# Patient Record
Sex: Female | Born: 1965 | Race: White | Hispanic: No | Marital: Married | Smoking: Never smoker
Health system: Southern US, Community
[De-identification: ages and names within clinical notes are randomized; demographics above are authoritative.]

## PROBLEM LIST (undated history)

## (undated) DIAGNOSIS — R0600 Dyspnea, unspecified: Secondary | ICD-10-CM

## (undated) DIAGNOSIS — F419 Anxiety disorder, unspecified: Secondary | ICD-10-CM

## (undated) DIAGNOSIS — Z973 Presence of spectacles and contact lenses: Secondary | ICD-10-CM

## (undated) DIAGNOSIS — K5792 Diverticulitis of intestine, part unspecified, without perforation or abscess without bleeding: Secondary | ICD-10-CM

## (undated) DIAGNOSIS — K219 Gastro-esophageal reflux disease without esophagitis: Secondary | ICD-10-CM

## (undated) DIAGNOSIS — R110 Nausea: Secondary | ICD-10-CM

## (undated) DIAGNOSIS — M1990 Unspecified osteoarthritis, unspecified site: Secondary | ICD-10-CM

## (undated) DIAGNOSIS — E785 Hyperlipidemia, unspecified: Secondary | ICD-10-CM

## (undated) DIAGNOSIS — M549 Dorsalgia, unspecified: Secondary | ICD-10-CM

## (undated) DIAGNOSIS — R519 Headache, unspecified: Secondary | ICD-10-CM

## (undated) DIAGNOSIS — S92919A Unspecified fracture of unspecified toe(s), initial encounter for closed fracture: Secondary | ICD-10-CM

## (undated) DIAGNOSIS — R079 Chest pain, unspecified: Secondary | ICD-10-CM

## (undated) DIAGNOSIS — G510 Bell's palsy: Secondary | ICD-10-CM

## (undated) DIAGNOSIS — F32A Depression, unspecified: Secondary | ICD-10-CM

## (undated) DIAGNOSIS — I10 Essential (primary) hypertension: Secondary | ICD-10-CM

## (undated) DIAGNOSIS — Z9889 Other specified postprocedural states: Secondary | ICD-10-CM

## (undated) DIAGNOSIS — Z8719 Personal history of other diseases of the digestive system: Secondary | ICD-10-CM

## (undated) DIAGNOSIS — F329 Major depressive disorder, single episode, unspecified: Secondary | ICD-10-CM

## (undated) DIAGNOSIS — S82899A Other fracture of unspecified lower leg, initial encounter for closed fracture: Secondary | ICD-10-CM

## (undated) HISTORY — DX: Essential (primary) hypertension: I10

## (undated) HISTORY — PX: TONSILLECTOMY: SUR1361

## (undated) HISTORY — PX: CHOLECYSTECTOMY: SHX55

## (undated) HISTORY — PX: CATARACT EXTRACTION: SUR2

## (undated) HISTORY — DX: Anxiety disorder, unspecified: F41.9

## (undated) HISTORY — PX: REDUCTION MAMMAPLASTY: SUR839

## (undated) HISTORY — DX: Hyperlipidemia, unspecified: E78.5

## (undated) HISTORY — PX: INJECTION KNEE: SHX2446

## (undated) HISTORY — DX: Other fracture of unspecified lower leg, initial encounter for closed fracture: S82.899A

## (undated) HISTORY — DX: Gastro-esophageal reflux disease without esophagitis: K21.9

## (undated) HISTORY — DX: Unspecified fracture of unspecified toe(s), initial encounter for closed fracture: S92.919A

## (undated) HISTORY — DX: Diverticulitis of intestine, part unspecified, without perforation or abscess without bleeding: K57.92

## (undated) HISTORY — DX: Chest pain, unspecified: R07.9

## (undated) HISTORY — PX: KNEE ARTHROSCOPY: SUR90

---

## 1986-04-25 HISTORY — PX: SEPTOPLASTY: SUR1290

## 1989-04-25 HISTORY — PX: OTHER SURGICAL HISTORY: SHX169

## 1997-11-07 ENCOUNTER — Other Ambulatory Visit: Admission: RE | Admit: 1997-11-07 | Discharge: 1997-11-07 | Payer: Self-pay | Admitting: *Deleted

## 1997-11-12 ENCOUNTER — Other Ambulatory Visit: Admission: RE | Admit: 1997-11-12 | Discharge: 1997-11-12 | Payer: Self-pay | Admitting: *Deleted

## 1998-03-30 ENCOUNTER — Other Ambulatory Visit: Admission: RE | Admit: 1998-03-30 | Discharge: 1998-03-30 | Payer: Self-pay | Admitting: *Deleted

## 1998-10-06 ENCOUNTER — Emergency Department (HOSPITAL_COMMUNITY): Admission: EM | Admit: 1998-10-06 | Discharge: 1998-10-06 | Payer: Self-pay | Admitting: Emergency Medicine

## 1999-06-29 ENCOUNTER — Encounter: Payer: Self-pay | Admitting: Family Medicine

## 1999-06-29 ENCOUNTER — Encounter: Admission: RE | Admit: 1999-06-29 | Discharge: 1999-06-29 | Payer: Self-pay | Admitting: Family Medicine

## 1999-12-25 ENCOUNTER — Encounter: Payer: Self-pay | Admitting: Family Medicine

## 1999-12-25 LAB — CONVERTED CEMR LAB

## 2000-11-10 ENCOUNTER — Encounter: Payer: Self-pay | Admitting: Emergency Medicine

## 2000-11-10 ENCOUNTER — Emergency Department (HOSPITAL_COMMUNITY): Admission: EM | Admit: 2000-11-10 | Discharge: 2000-11-10 | Payer: Self-pay | Admitting: Emergency Medicine

## 2000-11-22 ENCOUNTER — Encounter: Payer: Self-pay | Admitting: Neurosurgery

## 2000-11-22 ENCOUNTER — Encounter: Admission: RE | Admit: 2000-11-22 | Discharge: 2000-11-22 | Payer: Self-pay | Admitting: Neurosurgery

## 2001-12-27 ENCOUNTER — Emergency Department (HOSPITAL_COMMUNITY): Admission: EM | Admit: 2001-12-27 | Discharge: 2001-12-27 | Payer: Self-pay | Admitting: Emergency Medicine

## 2002-04-10 ENCOUNTER — Encounter (HOSPITAL_COMMUNITY): Admission: RE | Admit: 2002-04-10 | Discharge: 2002-05-10 | Payer: Self-pay | Admitting: Orthopedic Surgery

## 2002-04-25 HISTORY — PX: BACK SURGERY: SHX140

## 2002-05-03 ENCOUNTER — Encounter: Payer: Self-pay | Admitting: Orthopedic Surgery

## 2002-05-03 ENCOUNTER — Encounter: Admission: RE | Admit: 2002-05-03 | Discharge: 2002-05-03 | Payer: Self-pay | Admitting: *Deleted

## 2002-05-27 ENCOUNTER — Encounter: Payer: Self-pay | Admitting: Orthopedic Surgery

## 2002-05-27 ENCOUNTER — Encounter: Admission: RE | Admit: 2002-05-27 | Discharge: 2002-05-27 | Payer: Self-pay | Admitting: Orthopedic Surgery

## 2002-06-10 ENCOUNTER — Encounter: Admission: RE | Admit: 2002-06-10 | Discharge: 2002-06-10 | Payer: Self-pay | Admitting: *Deleted

## 2002-06-10 ENCOUNTER — Encounter: Payer: Self-pay | Admitting: Orthopedic Surgery

## 2002-07-05 ENCOUNTER — Encounter: Payer: Self-pay | Admitting: Neurosurgery

## 2002-07-09 ENCOUNTER — Encounter: Payer: Self-pay | Admitting: Neurosurgery

## 2002-07-09 ENCOUNTER — Inpatient Hospital Stay (HOSPITAL_COMMUNITY): Admission: RE | Admit: 2002-07-09 | Discharge: 2002-07-12 | Payer: Self-pay | Admitting: Neurosurgery

## 2002-07-17 ENCOUNTER — Encounter: Payer: Self-pay | Admitting: Neurosurgery

## 2002-07-17 ENCOUNTER — Encounter: Admission: RE | Admit: 2002-07-17 | Discharge: 2002-07-17 | Payer: Self-pay | Admitting: Neurosurgery

## 2002-08-27 ENCOUNTER — Encounter: Payer: Self-pay | Admitting: Neurosurgery

## 2002-08-27 ENCOUNTER — Encounter: Admission: RE | Admit: 2002-08-27 | Discharge: 2002-08-27 | Payer: Self-pay | Admitting: Neurosurgery

## 2002-10-08 ENCOUNTER — Encounter: Payer: Self-pay | Admitting: Neurosurgery

## 2002-10-08 ENCOUNTER — Encounter: Admission: RE | Admit: 2002-10-08 | Discharge: 2002-10-08 | Payer: Self-pay | Admitting: Neurosurgery

## 2002-11-14 ENCOUNTER — Encounter: Payer: Self-pay | Admitting: Neurosurgery

## 2002-11-14 ENCOUNTER — Ambulatory Visit (HOSPITAL_COMMUNITY): Admission: RE | Admit: 2002-11-14 | Discharge: 2002-11-14 | Payer: Self-pay | Admitting: Neurosurgery

## 2003-05-01 ENCOUNTER — Encounter: Admission: RE | Admit: 2003-05-01 | Discharge: 2003-05-01 | Payer: Self-pay | Admitting: Obstetrics and Gynecology

## 2003-09-25 ENCOUNTER — Ambulatory Visit (HOSPITAL_COMMUNITY): Admission: RE | Admit: 2003-09-25 | Discharge: 2003-09-25 | Payer: Self-pay | Admitting: Family Medicine

## 2003-10-15 ENCOUNTER — Emergency Department (HOSPITAL_COMMUNITY): Admission: EM | Admit: 2003-10-15 | Discharge: 2003-10-15 | Payer: Self-pay | Admitting: Emergency Medicine

## 2003-10-28 ENCOUNTER — Ambulatory Visit (HOSPITAL_COMMUNITY): Admission: RE | Admit: 2003-10-28 | Discharge: 2003-10-28 | Payer: Self-pay | Admitting: Vascular Surgery

## 2003-11-27 ENCOUNTER — Encounter: Admission: RE | Admit: 2003-11-27 | Discharge: 2003-11-27 | Payer: Self-pay | Admitting: Neurosurgery

## 2003-12-02 ENCOUNTER — Ambulatory Visit (HOSPITAL_COMMUNITY): Admission: RE | Admit: 2003-12-02 | Discharge: 2003-12-02 | Payer: Self-pay | Admitting: Neurosurgery

## 2004-01-22 ENCOUNTER — Ambulatory Visit: Payer: Self-pay | Admitting: Psychiatry

## 2004-01-28 ENCOUNTER — Ambulatory Visit: Payer: Self-pay | Admitting: Psychology

## 2004-02-27 ENCOUNTER — Ambulatory Visit: Payer: Self-pay | Admitting: Psychology

## 2004-05-04 ENCOUNTER — Ambulatory Visit: Payer: Self-pay | Admitting: Psychology

## 2004-06-28 ENCOUNTER — Ambulatory Visit: Payer: Self-pay | Admitting: Family Medicine

## 2004-07-01 ENCOUNTER — Ambulatory Visit: Payer: Self-pay | Admitting: Family Medicine

## 2004-08-31 ENCOUNTER — Ambulatory Visit: Payer: Self-pay | Admitting: Psychiatry

## 2004-11-25 ENCOUNTER — Ambulatory Visit: Payer: Self-pay | Admitting: Psychiatry

## 2005-01-14 ENCOUNTER — Other Ambulatory Visit: Admission: RE | Admit: 2005-01-14 | Discharge: 2005-01-14 | Payer: Self-pay | Admitting: Obstetrics and Gynecology

## 2005-02-21 ENCOUNTER — Ambulatory Visit: Payer: Self-pay | Admitting: Psychology

## 2005-04-27 ENCOUNTER — Ambulatory Visit: Payer: Self-pay | Admitting: Internal Medicine

## 2005-05-03 ENCOUNTER — Ambulatory Visit: Payer: Self-pay | Admitting: Psychiatry

## 2005-07-18 ENCOUNTER — Ambulatory Visit: Payer: Self-pay | Admitting: Family Medicine

## 2005-10-10 ENCOUNTER — Ambulatory Visit: Payer: Self-pay | Admitting: Family Medicine

## 2005-10-27 ENCOUNTER — Ambulatory Visit: Payer: Self-pay | Admitting: Family Medicine

## 2005-10-31 ENCOUNTER — Ambulatory Visit: Payer: Self-pay | Admitting: Family Medicine

## 2005-12-28 ENCOUNTER — Ambulatory Visit: Payer: Self-pay | Admitting: Family Medicine

## 2006-01-10 ENCOUNTER — Ambulatory Visit (HOSPITAL_COMMUNITY): Payer: Self-pay | Admitting: Psychiatry

## 2006-02-07 ENCOUNTER — Ambulatory Visit (HOSPITAL_COMMUNITY): Payer: Self-pay | Admitting: Psychiatry

## 2006-04-06 ENCOUNTER — Ambulatory Visit (HOSPITAL_COMMUNITY): Payer: Self-pay | Admitting: Psychiatry

## 2006-06-08 ENCOUNTER — Ambulatory Visit (HOSPITAL_COMMUNITY): Payer: Self-pay | Admitting: Psychiatry

## 2006-07-12 ENCOUNTER — Ambulatory Visit: Payer: Self-pay | Admitting: Family Medicine

## 2006-08-02 ENCOUNTER — Ambulatory Visit: Payer: Self-pay | Admitting: Family Medicine

## 2006-08-03 ENCOUNTER — Emergency Department (HOSPITAL_COMMUNITY): Admission: EM | Admit: 2006-08-03 | Discharge: 2006-08-03 | Payer: Self-pay | Admitting: Emergency Medicine

## 2006-08-03 ENCOUNTER — Ambulatory Visit (HOSPITAL_COMMUNITY): Payer: Self-pay | Admitting: Psychiatry

## 2006-08-04 ENCOUNTER — Ambulatory Visit: Payer: Self-pay | Admitting: Family Medicine

## 2006-10-03 ENCOUNTER — Telehealth (INDEPENDENT_AMBULATORY_CARE_PROVIDER_SITE_OTHER): Payer: Self-pay | Admitting: *Deleted

## 2006-10-10 ENCOUNTER — Ambulatory Visit (HOSPITAL_COMMUNITY): Payer: Self-pay | Admitting: Psychiatry

## 2006-11-07 ENCOUNTER — Ambulatory Visit: Payer: Self-pay | Admitting: Family Medicine

## 2006-11-07 LAB — CONVERTED CEMR LAB
BUN: 7 mg/dL (ref 6–23)
CO2: 28 meq/L (ref 19–32)
Calcium: 9 mg/dL (ref 8.4–10.5)
Chloride: 107 meq/L (ref 96–112)
Direct LDL: 94.8 mg/dL
Glucose, Bld: 89 mg/dL (ref 70–99)
TSH: 1.54 microintl units/mL (ref 0.35–5.50)
VLDL: 43 mg/dL — ABNORMAL HIGH (ref 0–40)

## 2006-11-09 ENCOUNTER — Encounter: Payer: Self-pay | Admitting: Family Medicine

## 2006-11-09 DIAGNOSIS — I10 Essential (primary) hypertension: Secondary | ICD-10-CM | POA: Insufficient documentation

## 2006-11-09 DIAGNOSIS — Z9089 Acquired absence of other organs: Secondary | ICD-10-CM

## 2006-11-09 DIAGNOSIS — E7800 Pure hypercholesterolemia, unspecified: Secondary | ICD-10-CM | POA: Insufficient documentation

## 2006-11-10 DIAGNOSIS — F411 Generalized anxiety disorder: Secondary | ICD-10-CM | POA: Insufficient documentation

## 2006-11-10 DIAGNOSIS — K219 Gastro-esophageal reflux disease without esophagitis: Secondary | ICD-10-CM

## 2006-11-20 ENCOUNTER — Ambulatory Visit: Payer: Self-pay | Admitting: Family Medicine

## 2006-11-20 DIAGNOSIS — N62 Hypertrophy of breast: Secondary | ICD-10-CM | POA: Insufficient documentation

## 2006-12-01 ENCOUNTER — Telehealth: Payer: Self-pay | Admitting: Family Medicine

## 2006-12-12 ENCOUNTER — Telehealth: Payer: Self-pay | Admitting: Family Medicine

## 2006-12-13 ENCOUNTER — Ambulatory Visit (HOSPITAL_COMMUNITY): Admission: RE | Admit: 2006-12-13 | Discharge: 2006-12-13 | Payer: Self-pay | Admitting: Plastic Surgery

## 2006-12-18 ENCOUNTER — Telehealth: Payer: Self-pay | Admitting: Family Medicine

## 2006-12-19 ENCOUNTER — Ambulatory Visit: Payer: Self-pay | Admitting: Family Medicine

## 2006-12-19 DIAGNOSIS — M545 Low back pain, unspecified: Secondary | ICD-10-CM | POA: Insufficient documentation

## 2006-12-19 DIAGNOSIS — M549 Dorsalgia, unspecified: Secondary | ICD-10-CM

## 2007-01-08 ENCOUNTER — Telehealth (INDEPENDENT_AMBULATORY_CARE_PROVIDER_SITE_OTHER): Payer: Self-pay | Admitting: *Deleted

## 2007-01-31 ENCOUNTER — Ambulatory Visit (HOSPITAL_COMMUNITY): Payer: Self-pay | Admitting: Psychology

## 2007-02-01 ENCOUNTER — Encounter: Payer: Self-pay | Admitting: Family Medicine

## 2007-02-02 ENCOUNTER — Ambulatory Visit: Payer: Self-pay | Admitting: Family Medicine

## 2007-02-04 LAB — CONVERTED CEMR LAB
AST: 21 units/L (ref 0–37)
BUN: 8 mg/dL (ref 6–23)
Cholesterol: 197 mg/dL (ref 0–200)
GFR calc Af Amer: 101 mL/min
GFR calc non Af Amer: 84 mL/min
Glucose, Bld: 90 mg/dL (ref 70–99)
Potassium: 4.3 meq/L (ref 3.5–5.1)
Total CHOL/HDL Ratio: 8.1
Triglycerides: 196 mg/dL — ABNORMAL HIGH (ref 0–149)

## 2007-02-05 ENCOUNTER — Ambulatory Visit: Payer: Self-pay | Admitting: Family Medicine

## 2007-03-12 ENCOUNTER — Telehealth: Payer: Self-pay | Admitting: Family Medicine

## 2007-04-16 ENCOUNTER — Telehealth: Payer: Self-pay | Admitting: Family Medicine

## 2007-04-17 ENCOUNTER — Ambulatory Visit: Payer: Self-pay | Admitting: Family Medicine

## 2007-05-28 ENCOUNTER — Ambulatory Visit: Payer: Self-pay | Admitting: Family Medicine

## 2007-05-28 DIAGNOSIS — R609 Edema, unspecified: Secondary | ICD-10-CM | POA: Insufficient documentation

## 2007-05-28 DIAGNOSIS — R51 Headache: Secondary | ICD-10-CM

## 2007-05-28 DIAGNOSIS — R519 Headache, unspecified: Secondary | ICD-10-CM | POA: Insufficient documentation

## 2007-06-04 ENCOUNTER — Telehealth (INDEPENDENT_AMBULATORY_CARE_PROVIDER_SITE_OTHER): Payer: Self-pay | Admitting: *Deleted

## 2007-06-07 ENCOUNTER — Ambulatory Visit: Payer: Self-pay | Admitting: Family Medicine

## 2007-06-07 ENCOUNTER — Telehealth: Payer: Self-pay | Admitting: Family Medicine

## 2007-06-08 ENCOUNTER — Telehealth: Payer: Self-pay | Admitting: Family Medicine

## 2007-06-08 ENCOUNTER — Emergency Department (HOSPITAL_COMMUNITY): Admission: EM | Admit: 2007-06-08 | Discharge: 2007-06-08 | Payer: Self-pay | Admitting: Emergency Medicine

## 2007-06-11 ENCOUNTER — Ambulatory Visit: Payer: Self-pay | Admitting: Family Medicine

## 2007-06-11 DIAGNOSIS — G527 Disorders of multiple cranial nerves: Secondary | ICD-10-CM | POA: Insufficient documentation

## 2007-07-30 ENCOUNTER — Telehealth: Payer: Self-pay | Admitting: Family Medicine

## 2007-07-31 ENCOUNTER — Ambulatory Visit: Payer: Self-pay | Admitting: Family Medicine

## 2007-07-31 LAB — CONVERTED CEMR LAB
CO2: 26 meq/L (ref 19–32)
Calcium: 9 mg/dL (ref 8.4–10.5)
Chloride: 109 meq/L (ref 96–112)
Glucose, Bld: 107 mg/dL — ABNORMAL HIGH (ref 70–99)
Potassium: 3.6 meq/L (ref 3.5–5.1)

## 2007-08-04 ENCOUNTER — Encounter: Admission: RE | Admit: 2007-08-04 | Discharge: 2007-08-04 | Payer: Self-pay | Admitting: Family Medicine

## 2007-08-09 ENCOUNTER — Telehealth: Payer: Self-pay | Admitting: Family Medicine

## 2007-08-27 ENCOUNTER — Telehealth: Payer: Self-pay | Admitting: Family Medicine

## 2007-08-29 ENCOUNTER — Ambulatory Visit (HOSPITAL_COMMUNITY): Admission: RE | Admit: 2007-08-29 | Discharge: 2007-08-29 | Payer: Self-pay | Admitting: Neurosurgery

## 2007-09-20 ENCOUNTER — Encounter (HOSPITAL_COMMUNITY): Admission: RE | Admit: 2007-09-20 | Discharge: 2007-10-20 | Payer: Self-pay | Admitting: Neurosurgery

## 2007-09-21 ENCOUNTER — Ambulatory Visit: Payer: Self-pay | Admitting: Family Medicine

## 2007-09-21 ENCOUNTER — Telehealth: Payer: Self-pay | Admitting: Family Medicine

## 2007-09-21 LAB — CONVERTED CEMR LAB
Chloride: 110 meq/L (ref 96–112)
GFR calc Af Amer: 101 mL/min
GFR calc non Af Amer: 83 mL/min
Glucose, Bld: 90 mg/dL (ref 70–99)
Potassium: 4.1 meq/L (ref 3.5–5.1)
Sodium: 142 meq/L (ref 135–145)

## 2007-09-24 ENCOUNTER — Ambulatory Visit: Payer: Self-pay | Admitting: Family Medicine

## 2007-10-16 ENCOUNTER — Ambulatory Visit: Payer: Self-pay | Admitting: Family Medicine

## 2007-10-16 LAB — CONVERTED CEMR LAB
ALT: 21 units/L (ref 0–35)
Albumin: 3.6 g/dL (ref 3.5–5.2)
Alkaline Phosphatase: 53 units/L (ref 39–117)
BUN: 10 mg/dL (ref 6–23)
Basophils Relative: 0.2 % (ref 0.0–1.0)
CO2: 25 meq/L (ref 19–32)
Calcium: 9.1 mg/dL (ref 8.4–10.5)
Chloride: 112 meq/L (ref 96–112)
Creatinine, Ser: 0.9 mg/dL (ref 0.4–1.2)
Eosinophils Relative: 0.9 % (ref 0.0–5.0)
HDL: 22.9 mg/dL — ABNORMAL LOW (ref >39.0)
Platelets: 288 10*3/uL (ref 150–400)
RBC: 4.6 M/uL (ref 3.87–5.11)
RDW: 13.5 % (ref 11.5–14.6)
TSH: 1.07 microintl units/mL (ref 0.35–5.50)
Triglycerides: 146 mg/dL (ref 0–149)
VLDL: 29 mg/dL (ref 0–40)

## 2007-10-19 ENCOUNTER — Ambulatory Visit: Payer: Self-pay | Admitting: Family Medicine

## 2007-11-09 ENCOUNTER — Encounter: Admission: RE | Admit: 2007-11-09 | Discharge: 2007-11-09 | Payer: Self-pay | Admitting: Neurosurgery

## 2007-11-20 ENCOUNTER — Ambulatory Visit (HOSPITAL_COMMUNITY): Payer: Self-pay | Admitting: Psychology

## 2007-11-28 ENCOUNTER — Encounter (HOSPITAL_COMMUNITY): Admission: RE | Admit: 2007-11-28 | Discharge: 2007-12-28 | Payer: Self-pay | Admitting: Neurosurgery

## 2007-12-13 ENCOUNTER — Telehealth: Payer: Self-pay | Admitting: Family Medicine

## 2007-12-24 ENCOUNTER — Ambulatory Visit (HOSPITAL_COMMUNITY): Payer: Self-pay | Admitting: Psychology

## 2008-01-30 ENCOUNTER — Telehealth: Payer: Self-pay | Admitting: Family Medicine

## 2008-02-07 ENCOUNTER — Telehealth: Payer: Self-pay | Admitting: Family Medicine

## 2008-02-28 ENCOUNTER — Ambulatory Visit: Payer: Self-pay | Admitting: Family Medicine

## 2008-02-29 ENCOUNTER — Encounter: Payer: Self-pay | Admitting: Family Medicine

## 2008-03-03 ENCOUNTER — Ambulatory Visit: Payer: Self-pay | Admitting: Family Medicine

## 2008-04-07 ENCOUNTER — Ambulatory Visit (HOSPITAL_COMMUNITY): Payer: Self-pay | Admitting: Psychology

## 2008-04-14 ENCOUNTER — Telehealth: Payer: Self-pay | Admitting: Family Medicine

## 2008-04-15 ENCOUNTER — Encounter: Payer: Self-pay | Admitting: Family Medicine

## 2008-06-10 ENCOUNTER — Encounter: Payer: Self-pay | Admitting: Family Medicine

## 2008-06-10 ENCOUNTER — Telehealth: Payer: Self-pay | Admitting: Family Medicine

## 2008-08-02 ENCOUNTER — Emergency Department (HOSPITAL_COMMUNITY): Admission: EM | Admit: 2008-08-02 | Discharge: 2008-08-02 | Payer: Self-pay | Admitting: Emergency Medicine

## 2008-09-26 ENCOUNTER — Telehealth: Payer: Self-pay | Admitting: Family Medicine

## 2008-11-11 ENCOUNTER — Ambulatory Visit: Payer: Self-pay | Admitting: Family Medicine

## 2008-11-11 DIAGNOSIS — M546 Pain in thoracic spine: Secondary | ICD-10-CM | POA: Insufficient documentation

## 2008-11-11 DIAGNOSIS — R21 Rash and other nonspecific skin eruption: Secondary | ICD-10-CM | POA: Insufficient documentation

## 2008-11-12 ENCOUNTER — Telehealth (INDEPENDENT_AMBULATORY_CARE_PROVIDER_SITE_OTHER): Payer: Self-pay | Admitting: Internal Medicine

## 2008-11-18 ENCOUNTER — Telehealth (INDEPENDENT_AMBULATORY_CARE_PROVIDER_SITE_OTHER): Payer: Self-pay | Admitting: Internal Medicine

## 2008-12-11 ENCOUNTER — Telehealth: Payer: Self-pay | Admitting: Family Medicine

## 2009-04-13 ENCOUNTER — Telehealth: Payer: Self-pay | Admitting: Family Medicine

## 2009-04-21 ENCOUNTER — Ambulatory Visit: Payer: Self-pay | Admitting: Family Medicine

## 2009-05-04 ENCOUNTER — Telehealth: Payer: Self-pay | Admitting: Family Medicine

## 2009-06-16 ENCOUNTER — Ambulatory Visit: Payer: Self-pay | Admitting: Family Medicine

## 2009-06-16 DIAGNOSIS — J209 Acute bronchitis, unspecified: Secondary | ICD-10-CM

## 2009-06-17 ENCOUNTER — Telehealth: Payer: Self-pay | Admitting: Family Medicine

## 2009-06-25 ENCOUNTER — Telehealth: Payer: Self-pay | Admitting: Family Medicine

## 2009-07-03 ENCOUNTER — Ambulatory Visit: Payer: Self-pay | Admitting: Family Medicine

## 2009-09-07 ENCOUNTER — Telehealth: Payer: Self-pay | Admitting: Family Medicine

## 2009-09-17 ENCOUNTER — Ambulatory Visit: Payer: Self-pay | Admitting: Family Medicine

## 2009-10-08 ENCOUNTER — Telehealth: Payer: Self-pay | Admitting: Family Medicine

## 2009-11-05 ENCOUNTER — Ambulatory Visit: Payer: Self-pay | Admitting: Family Medicine

## 2009-12-04 ENCOUNTER — Ambulatory Visit: Payer: Self-pay | Admitting: Family Medicine

## 2009-12-25 ENCOUNTER — Telehealth: Payer: Self-pay | Admitting: Family Medicine

## 2010-01-01 ENCOUNTER — Ambulatory Visit: Payer: Self-pay | Admitting: Family Medicine

## 2010-01-13 ENCOUNTER — Telehealth: Payer: Self-pay | Admitting: Family Medicine

## 2010-01-14 ENCOUNTER — Ambulatory Visit: Payer: Self-pay | Admitting: Family Medicine

## 2010-01-18 ENCOUNTER — Telehealth: Payer: Self-pay | Admitting: Family Medicine

## 2010-02-24 ENCOUNTER — Telehealth: Payer: Self-pay | Admitting: Family Medicine

## 2010-03-08 ENCOUNTER — Emergency Department (HOSPITAL_COMMUNITY): Admission: EM | Admit: 2010-03-08 | Discharge: 2010-03-08 | Payer: Self-pay | Admitting: Emergency Medicine

## 2010-03-12 ENCOUNTER — Ambulatory Visit: Payer: Self-pay | Admitting: Family Medicine

## 2010-03-12 DIAGNOSIS — S9030XA Contusion of unspecified foot, initial encounter: Secondary | ICD-10-CM

## 2010-05-15 ENCOUNTER — Encounter: Payer: Self-pay | Admitting: *Deleted

## 2010-05-17 ENCOUNTER — Encounter: Payer: Self-pay | Admitting: Plastic Surgery

## 2010-05-27 NOTE — Progress Notes (Signed)
Summary: not  much better  Phone Note Call from Patient Call back at Home Phone [phone]   Caller: Patient Call For: Dr. Hetty Ely Summary of Call: Pt was seen on 2/22 for bronchitis and given a z-pack.  She says she is still not much better.  Still congested, still coughing.  She says she cant  take guaifenesin , this bothers her stomach.  She is asking for a refill on z-pack and also would like tesselon.  Uses [facility]. Initial call taken by: Lowella Petties CMA,  June 25, 2009 9:37 AM  Follow-up for Phone Call        Reasonable. Sent in both repeat Zpak and Tessalon. Follow-up by: Shaune Leeks MD,  June 25, 2009 10:20 AM  Additional Follow-up for Phone Call Additional follow up Details #1::        Pt also wants  prednisone for her cough, she says she has had these sxs off and on for 5 months.  Finished a course of prednisone last week. Additional Follow-up by: Lowella Petties CMA,  June 25, 2009 10:55 AM    Additional Follow-up for Phone Call Additional follow up Details #2::    Won't do the prednisone w/o seeing her. Try what we have done. Follow-up by: Shaune Leeks MD,  June 25, 2009 11:45 AM  Additional Follow-up for Phone Call Additional follow up Details #3:: Details for Additional Follow-up Action Taken: Advised pt. Additional Follow-up by: Lowella Petties CMA,  June 25, 2009 12:00 PM  New/Updated Medications: ZITHROMAX Z-PAK 250 MG TABS (AZITHROMYCIN) as dir TESSALON 200 MG CAPS (BENZONATATE) one tab by mouth three times a day as needed fot cough. Prescriptions: TESSALON 200 MG CAPS (BENZONATATE) one tab by mouth three times a day as needed fot cough.  #30 x 0   Entered and Authorized by:   Shaune Leeks MD   Signed by:   Shaune Leeks MD on 06/25/2009   Method used:   Electronically to        [facility] (retail)       [address] [place], [place]  [address]       Ph:  [phone]       Fax: [phone]   RxID:   [id number] Christena Deem Z-PAK 250 MG TABS (AZITHROMYCIN) as dir  #1 pak x 0   Entered and Authorized by:   Shaune Leeks MD   Signed by:   Shaune Leeks MD on 06/25/2009   Method used:   Electronically to        [facility] (retail)       [address]       [place], [place]  [address]       Ph: [phone]       Fax: [phone]   RxID:   [id number]

## 2010-05-27 NOTE — Progress Notes (Signed)
Summary: refill request for xanax  Phone Note Refill Request Message from:  Fax from Pharmacy  Refills Requested: Medication #1:  ALPRAZOLAM 0.25 MG TABS 1/2-1 Tab by mouth two times a day as needed for anxiousness   Last Refilled: 01/13/2010 Faxed request form [facility], phone [phone].  Initial call taken by: Lowella Petties CMA, AAMA,  February 24, 2010 10:46 AM  Follow-up for Phone Call        Rx called to pharmacy Follow-up by: Linde Gillis CMA Duncan Dull),  February 24, 2010 11:13 AM    Prescriptions: ALPRAZOLAM 0.25 MG TABS (ALPRAZOLAM) 1/2-1 Tab by mouth two times a day as needed for anxiousness  #30 x 0   Entered and Authorized by:   Ruthe Mannan MD   Signed by:   Ruthe Mannan MD on 02/24/2010   Method used:   Telephoned to ...       [facility] (retail)       [address]       [place], [place]  [address]       Ph: [phone]       Fax: [phone]   RxID:   [id number]

## 2010-05-27 NOTE — Assessment & Plan Note (Signed)
Summary: 1 M F/U WEIGHT MGMT/DLO   Vital Signs:  Patient profile:   45 year old female Height:      64.50 inches Weight:      226.38 pounds BMI:     38.40 Temp:     98.4 degrees F oral Pulse rate:   76 / minute Pulse rhythm:   regular BP sitting:   130 / 80  (left arm) Cuff size:   large  Vitals Entered By: Linde Gillis CMA Duncan Dull) (December 04, 2009 3:33 PM) CC: 1 month follow up weight management, anxiety   History of Present Illness: 45 yo here to follow up anxiety and weight management.  Anxiety- Has had issues with anxiety since she was a teenager. When her mom died in 09/21/2001, was on multiple medications, including Paxil, Wellbutrin, Prozac.  All made her feel worse.  Gained 30 pounds on Prozac, Paxil made her feel more anxious.  Added Busprione 15 mg which she feels has helped immensely.  Has only used three Alprazolam tablets since we started the Buspirone. No SI or HI.   Denies any other symptoms of depression.  Obesity- has tried to cut back on her portions.  Knows that her downfall is regular sodas, she is cutting back.  24 hour food recall: breakfast:  instant grits with water, fruit, low fat yogurt lunch- chicken alredo (lean cuisine) dinner- salad, soda, ranch dressing  Has actually gained weight since last office visit.  Current Medications (verified): 1)  Tylenol 325 Mg  Tabs (Acetaminophen) .... As Needed 2)  Alprazolam 0.25 Mg Tabs (Alprazolam) .... 1/2-1 Tab By Mouth Two Times A Day As Needed For Anxiousness 3)  Buspirone Hcl 15 Mg Tabs (Buspirone Hcl) .Marland Kitchen.. 1 Tab By Mouth Daily. 4)  Phentermine Hcl 15 Mg Caps (Phentermine Hcl) .Marland Kitchen.. 1 Tab By Mouth Every Morning- One To Two Hours Before Breakfast.  Allergies: 1)  ! Penicillin V Potassium (Penicillin V Potassium) 2)  ! Erythromycin Base (Erythromycin Base) 3)  ! Bentyl (Dicyclomine Hcl) 4)  ! Cipro (Ciprofloxacin Hcl) 5)  ! Cleocin (Clindamycin Hcl) 6)  ! Paxil (Paroxetine Hcl) 7)  ! Premarin (Estrogens  Conjugated) 8)  ! Provera (Medroxyprogesterone Acetate) 9)  ! Tagamet Hb (Cimetidine) 10)  ! Crestor (Rosuvastatin Calcium) 11)  ! Doxycycline Hyclate (Doxycycline Hyclate) 12)  ! * Lisinopril  Past History:  Past Medical History: Last updated: 11/27/06 Anxiety GERD Hyperlipidemia Hypertension  Past Surgical History: Last updated: Nov 27, 2006 1988        Septoplasty  Nasal Fracture 1991        Tonsillectomy 1998        Choleycystectomy 06/29/99      Flexion L/S Series - mild degenerative changes 7/02         WNL 10/15/02    MRI L/S - Fusion L5-S1 07/09/02    Back surgery - Dr. Wynetta Emery   L4/5 Fusion 09/25/03      MRA Neck, ?stenosis  LICA   ? left vert. artery stenosis 09/25/03      MRI Brain with and without  WNL except mild sinusitis    MRA nml. 10/28/03      Angiogram Head/Neck nml.  Family History: Last updated: 11/27/2006 Father: Died 27, MI Mother: Alive 40 COPD, HTN, GERD Siblings: 2 brothers HTN, high chol. GP:  High chol. CV:  (+) Father MI, PGF MI, PGM BP:  (+) Mother, brother DM:  (+) G. 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  Risk Factors: Smoking Status: never (11/09/2006)  Review of Systems      See HPI Psych:  Denies mental problems, panic attacks, sense of great danger, suicidal thoughts/plans, thoughts of violence, unusual visions or sounds, and thoughts /plans of harming others.  Physical Exam  General:  alert, well-developed, well-nourished, well-hydrated, and overweight-appearing.   Psych:  talkative, pleasant, normal affect, moderately anxious   Impression & Recommendations:  Problem # 1:  ANXIETY /PANIC ATTACKS (ICD-300.00) Assessment Improved Time spent with patient 25 minutes, more  than 50% of this time was spent counseling patient on anxiety and weight management. Stable on Busprinone, Xanax as needed.  Her updated medication list for this problem includes:    Alprazolam 0.25 Mg Tabs (Alprazolam) .Marland Kitchen... 1/2-1 tab by mouth two times a day as needed for anxiousness    Buspirone Hcl 15 Mg Tabs (Buspirone hcl) .Marland Kitchen... 1 tab by mouth daily.  Problem # 2:  MORBID OBESITY (ICD-278.01) Assessment: Deteriorated Will try phenteramine.  Counselled on appropriate use.  Pt to follow up in 1 month.  Complete Medication List: 1)  Tylenol 325 Mg Tabs (Acetaminophen) .... As needed 2)  Alprazolam 0.25 Mg Tabs (Alprazolam) .... 1/2-1 tab by mouth two times a day as needed for anxiousness 3)  Buspirone Hcl 15 Mg Tabs (Buspirone hcl) .Marland Kitchen.. 1 tab by mouth daily. 4)  Phentermine Hcl 15 Mg Caps (Phentermine hcl) .Marland Kitchen.. 1 tab by mouth every morning- one to two hours before breakfast.  Patient Instructions: 1)  Please make a follow up appointment in 1 month. 2)  The medication list was reviewed and reconciled.  All changed / newly prescribed medications were explained.  A complete medication list was provided to the patient / caregiver.  Prescriptions: PHENTERMINE HCL 15 MG CAPS (PHENTERMINE HCL) 1 tab by mouth every morning- one to two hours before breakfast.  #30 x 0   Entered and Authorized by:   Ruthe Mannan MD   Signed by:   Ruthe Mannan MD on 12/04/2009   Method used:   Print then Give to Patient   RxID:   [id number]   Current Allergies (reviewed today): ! PENICILLIN V POTASSIUM (PENICILLIN V POTASSIUM) ! ERYTHROMYCIN BASE (ERYTHROMYCIN BASE) ! BENTYL (DICYCLOMINE HCL) ! CIPRO (CIPROFLOXACIN HCL) ! CLEOCIN (CLINDAMYCIN HCL) ! PAXIL (PAROXETINE HCL) ! PREMARIN (ESTROGENS CONJUGATED) ! PROVERA (MEDROXYPROGESTERONE ACETATE) ! TAGAMET HB (CIMETIDINE) ! CRESTOR (ROSUVASTATIN CALCIUM) ! DOXYCYCLINE HYCLATE (DOXYCYCLINE HYCLATE) ! * LISINOPRIL

## 2010-05-27 NOTE — Progress Notes (Signed)
Summary: alprazolam  Phone Note Refill Request Message from:  Fax from Pharmacy on December 25, 2009 11:38 AM  Refills Requested: Medication #1:  ALPRAZOLAM 0.25 MG TABS 1/2-1 Tab by mouth two times a day as needed for anxiousness   Last Refilled: 11/09/2009 Refill request from [facility]. [phone]  Initial call taken by: Melody Comas,  December 25, 2009 11:39 AM  Follow-up for Phone Call        please call in.  Follow-up by: Crawford Givens MD,  December 25, 2009 1:34 PM  Additional Follow-up for Phone Call Additional follow up Details #1::        Called to pharmacy. Additional Follow-up by: Lowella Petties CMA,  December 25, 2009 2:48 PM    Prescriptions: ALPRAZOLAM 0.25 MG TABS (ALPRAZOLAM) 1/2-1 Tab by mouth two times a day as needed for anxiousness  #30 x 0   Entered and Authorized by:   Crawford Givens MD   Signed by:   Crawford Givens MD on 12/25/2009   Method used:   Telephoned to ...       [facility] (retail)       [address]       [place], [place]  [address]       Ph: [phone]       Fax: [phone]   RxID:   [id number]

## 2010-05-27 NOTE — Assessment & Plan Note (Signed)
Summary: FOLLOW UP WITH MEDICATION/ 30 MINS   Vital Signs:  Patient profile:   45 year old female Height:      64.50 inches Weight:      223.25 pounds BMI:     37.87 Temp:     98.3 degrees F oral Pulse rate:   88 / minute Pulse rhythm:   regular BP sitting:   126 / 84  (right arm) Cuff size:   regular  Vitals Entered By: Linde Gillis CMA Duncan Dull) (November 05, 2009 3:26 PM) CC: follow-up visit medications   History of Present Illness: 45 yo here to discuss anxiety.  Has had issues with anxiety since she was a teenager. When her mom died in 09/21/01, was on multiple medications, including Paxil, Wellbutrin, Prozac.  All made her feel worse.  Gained 30 pounds on Prozac, Paxil made her feel more anxious.  Adding Buspirone 15 mg daily to her Alprazalam as needed.  She feels much better!! Mind is no longer racing, not anxious anymore.  Everyone in her life says she seems much happier. Adoption of her two kids went through last month.  No SI or HI.   Denies any other symptoms of depression.  Current Medications (verified): 1)  Tylenol 325 Mg  Tabs (Acetaminophen) .... As Needed 2)  Alprazolam 0.25 Mg Tabs (Alprazolam) .... 1/2-1 Tab By Mouth Two Times A Day As Needed For Anxiousness 3)  Tessalon 200 Mg Caps (Benzonatate) .... One Tab By Mouth Three Times A Day As Needed Fot Cough. 4)  Buspirone Hcl 15 Mg Tabs (Buspirone Hcl) .Marland Kitchen.. 1 Tab By Mouth Daily.  Allergies: 1)  ! Penicillin V Potassium (Penicillin V Potassium) 2)  ! Erythromycin Base (Erythromycin Base) 3)  ! Bentyl (Dicyclomine Hcl) 4)  ! Cipro (Ciprofloxacin Hcl) 5)  ! Cleocin (Clindamycin Hcl) 6)  ! Paxil (Paroxetine Hcl) 7)  ! Premarin (Estrogens Conjugated) 8)  ! Provera (Medroxyprogesterone Acetate) 9)  ! Tagamet Hb (Cimetidine) 10)  ! Crestor (Rosuvastatin Calcium) 11)  ! Doxycycline Hyclate (Doxycycline Hyclate) 12)  ! * Lisinopril  Past History:  Past Medical History: Last updated:  11-27-06 Anxiety GERD Hyperlipidemia Hypertension  Past Surgical History: Last updated: 11/27/06 1988        Septoplasty  Nasal Fracture 1991        Tonsillectomy 1998        Choleycystectomy 06/29/99      Flexion L/S Series - mild degenerative changes 7/02         WNL 10/15/02    MRI L/S - Fusion L5-S1 07/09/02    Back surgery - Dr. Wynetta Emery   L4/5 Fusion 09/25/03      MRA Neck, ?stenosis  LICA   ? left vert. artery stenosis 09/25/03      MRI Brain with and without  WNL except mild sinusitis    MRA nml. 10/28/03      Angiogram Head/Neck nml.  Family History: Last updated: 11-27-06 Father: Died 58, MI Mother: Alive 38 COPD, HTN, GERD Siblings: 2 brothers HTN, high chol. GP:  High chol. CV:  (+) Father MI, PGF MI, PGM BP:  (+) Mother, brother DM:  (+) G. 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  Risk Factors: Smoking Status: never (11/27/2006)  Review of  Systems      See HPI Psych:  Denies mental problems, panic attacks, sense of great danger, suicidal thoughts/plans, thoughts of violence, unusual visions or sounds, and thoughts /plans of harming others.  Physical Exam  General:  alert, well-developed, well-nourished, well-hydrated, and overweight-appearing.   Psych:  talkative, pleasant, normal affect, moderately anxious   Impression & Recommendations:  Problem # 1:  ANXIETY /PANIC ATTACKS (ICD-300.00) Assessment Improved Time spent with patient 25 minutes, more than 50% of this time was spent counseling patient on anxiety.  Doing much better.  Continue current dose of Buspar and Alprazolam.  Follow up as needed.  Her updated medication list for this problem includes:    Alprazolam 0.25 Mg Tabs (Alprazolam)  .Marland Kitchen... 1/2-1 tab by mouth two times a day as needed for anxiousness    Buspirone Hcl 15 Mg Tabs (Buspirone hcl) .Marland Kitchen... 1 tab by mouth daily.  Complete Medication List: 1)  Tylenol 325 Mg Tabs (Acetaminophen) .... As needed 2)  Alprazolam 0.25 Mg Tabs (Alprazolam) .... 1/2-1 tab by mouth two times a day as needed for anxiousness 3)  Tessalon 200 Mg Caps (Benzonatate) .... One tab by mouth three times a day as needed fot cough. 4)  Buspirone Hcl 15 Mg Tabs (Buspirone hcl) .Marland Kitchen.. 1 tab by mouth daily. Prescriptions: ALPRAZOLAM 0.25 MG TABS (ALPRAZOLAM) 1/2-1 Tab by mouth two times a day as needed for anxiousness  #30 x 0   Entered and Authorized by:   Ruthe Mannan MD   Signed by:   Ruthe Mannan MD on 11/05/2009   Method used:   Print then Give to Patient   RxID:   [id number]   Current Allergies (reviewed today): ! PENICILLIN V POTASSIUM (PENICILLIN V POTASSIUM) ! ERYTHROMYCIN BASE (ERYTHROMYCIN BASE) ! BENTYL (DICYCLOMINE HCL) ! CIPRO (CIPROFLOXACIN HCL) ! CLEOCIN (CLINDAMYCIN HCL) ! PAXIL (PAROXETINE HCL) ! PREMARIN (ESTROGENS CONJUGATED) ! PROVERA (MEDROXYPROGESTERONE ACETATE) ! TAGAMET HB (CIMETIDINE) ! CRESTOR (ROSUVASTATIN CALCIUM) ! DOXYCYCLINE HYCLATE (DOXYCYCLINE HYCLATE) ! * LISINOPRIL

## 2010-05-27 NOTE — Assessment & Plan Note (Signed)
Summary: ROA FOR 1 MONTH FOLLOW-UP/JRR   Vital Signs:  Patient profile:   45 year old female Height:      64.50 inches Weight:      214 pounds BMI:     36.30 Temp:     98.6 degrees F oral Pulse rate:   80 / minute Pulse rhythm:   regular BP sitting:   120 / 88  (left arm) Cuff size:   large  Vitals Entered By: Linde Gillis CMA Duncan Dull) (January 01, 2010 3:53 PM) CC: 1 month follow up   History of Present Illness: 45 yo here to follow up anxiety and weight management.  Anxiety- Has had issues with anxiety since she was a teenager. When her mom died in 15-Sep-2001, was on multiple medications, including Paxil, Wellbutrin, Prozac.  All made her feel worse.  Gained 30 pounds on Prozac, Paxil made her feel more anxious.  Added Busprione 15 mg which she feels has helped immensely.   Obesity- has tried to cut back on her portions.  Knows that her downfall is regular sodas, she is cutting back. Eating 1200-1800 calories per day. Has lost 22 pounds!  Walking more, more energy.      Current Medications (verified): 1)  Tylenol 325 Mg  Tabs (Acetaminophen) .... As Needed 2)  Alprazolam 0.25 Mg Tabs (Alprazolam) .... 1/2-1 Tab By Mouth Two Times A Day As Needed For Anxiousness 3)  Buspirone Hcl 15 Mg Tabs (Buspirone Hcl) .Marland Kitchen.. 1 Tab By Mouth Daily. 4)  Phentermine Hcl 15 Mg Caps (Phentermine Hcl) .Marland Kitchen.. 1 Tab By Mouth Every Morning- One To Two Hours Before Breakfast, 1 Tab By Mouth 1-2 Hours Before Lunch.  Allergies: 1)  ! Penicillin V Potassium (Penicillin V Potassium) 2)  ! Erythromycin Base (Erythromycin Base) 3)  ! Bentyl (Dicyclomine Hcl) 4)  ! Cipro (Ciprofloxacin Hcl) 5)  ! Cleocin (Clindamycin Hcl) 6)  ! Paxil (Paroxetine Hcl) 7)  ! Premarin (Estrogens Conjugated) 8)  ! Provera (Medroxyprogesterone Acetate) 9)  ! Tagamet Hb (Cimetidine) 10)  ! Crestor (Rosuvastatin Calcium) 11)  ! Doxycycline Hyclate (Doxycycline Hyclate) 12)  ! * Lisinopril  Past History:  Past Medical  History: Last updated: November 21, 2006 Anxiety GERD Hyperlipidemia Hypertension  Past Surgical History: Last updated: 21-Nov-2006 1988        Septoplasty  Nasal Fracture 1991        Tonsillectomy 1998        Choleycystectomy 06/29/99      Flexion L/S Series - mild degenerative changes 7/02         WNL 10/15/02    MRI L/S - Fusion L5-S1 07/09/02    Back surgery - Dr. Wynetta Emery   L4/5 Fusion 09/25/03      MRA Neck, ?stenosis  LICA   ? left vert. artery stenosis 09/25/03      MRI Brain with and without  WNL except mild sinusitis    MRA nml. 10/28/03      Angiogram Head/Neck nml.  Family History: Last updated: 11-21-2006 Father: Died 38, MI Mother: Alive 25 COPD, HTN, GERD Siblings: 2 brothers HTN, high chol. GP:  High chol. CV:  (+) Father MI, PGF MI, PGM BP:  (+) Mother, brother DM:  (+) G. 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  Risk Factors: Smoking Status: never (11/09/2006)  Review of Systems      See HPI Psych:  Denies mental problems, panic attacks, sense of great danger, suicidal thoughts/plans, thoughts of violence, unusual visions or sounds, and thoughts /plans of harming others.  Physical Exam  General:  alert, well-developed, well-nourished, well-hydrated, and overweight-appearing.  Lost 12 pounds! Psych:  talkative, pleasant, normal affect, moderately anxious   Impression & Recommendations:  Problem # 1:  MORBID OBESITY (ICD-278.01) Assessment Improved Doing well with Phentermine, will add 15 mg dose at lunch. BP looks great today, follow up in one month.  Problem # 2:  ANXIETY /PANIC ATTACKS (ICD-300.00) Assessment: Improved Continue current dose of Buspirone and as needed alprazolam. Her updated medication  list for this problem includes:    Alprazolam 0.25 Mg Tabs (Alprazolam) .Marland Kitchen... 1/2-1 tab by mouth two times a day as needed for anxiousness    Buspirone Hcl 15 Mg Tabs (Buspirone hcl) .Marland Kitchen... 1 tab by mouth daily.  Complete Medication List: 1)  Tylenol 325 Mg Tabs (Acetaminophen) .... As needed 2)  Alprazolam 0.25 Mg Tabs (Alprazolam) .... 1/2-1 tab by mouth two times a day as needed for anxiousness 3)  Buspirone Hcl 15 Mg Tabs (Buspirone hcl) .Marland Kitchen.. 1 tab by mouth daily. 4)  Phentermine Hcl 15 Mg Caps (Phentermine hcl) .Marland Kitchen.. 1 tab by mouth every morning- one to two hours before breakfast, 1 tab by mouth 1-2 hours before lunch. Prescriptions: PHENTERMINE HCL 15 MG CAPS (PHENTERMINE HCL) 1 tab by mouth every morning- one to two hours before breakfast, 1 tab by mouth 1-2 hours before lunch.  #60 x 0   Entered and Authorized by:   Ruthe Mannan MD   Signed by:   Ruthe Mannan MD on 01/01/2010   Method used:   Print then Give to Patient   RxID:   [id number]   Current Allergies (reviewed today): ! PENICILLIN V POTASSIUM (PENICILLIN V POTASSIUM) ! ERYTHROMYCIN BASE (ERYTHROMYCIN BASE) ! BENTYL (DICYCLOMINE HCL) ! CIPRO (CIPROFLOXACIN HCL) ! CLEOCIN (CLINDAMYCIN HCL) ! PAXIL (PAROXETINE HCL) ! PREMARIN (ESTROGENS CONJUGATED) ! PROVERA (MEDROXYPROGESTERONE ACETATE) ! TAGAMET HB (CIMETIDINE) ! CRESTOR (ROSUVASTATIN CALCIUM) ! DOXYCYCLINE HYCLATE (DOXYCYCLINE HYCLATE) ! * LISINOPRIL

## 2010-05-27 NOTE — Assessment & Plan Note (Signed)
Summary: F/U  ER ON 03/08/10/CLE   Vital Signs:  Patient profile:   45 year old female Height:      64.50 inches Weight:      210 pounds BMI:     35.62 Temp:     98.1 degrees F oral Pulse rate:   72 / minute Pulse rhythm:   regular BP sitting:   132 / 84  (right arm) Cuff size:   large  Vitals Entered By: Linde Gillis CMA Duncan Dull) (March 12, 2010 2:50 PM) CC: ER follow up Cassandra Mcconnell   History of Present Illness: 45 yo here for follow up [facility] ER.  ER records reviewed. Tripped at home and caught her left foot on her bed at home. Top of foot was very tender to touch, no swelling. had a previous 5th metacarpal fracture but toes feel fine.  xray neg for acute findings, felt to be a contusion.  She's doing fine, no pain with walking, no swelling.  Also needs to me to fill out forms for her adoption agency, brings them in today.  Allergies: 1)  ! Penicillin V Potassium (Penicillin V Potassium) 2)  ! Erythromycin Base (Erythromycin Base) 3)  ! Bentyl (Dicyclomine Hcl) 4)  ! Cipro (Ciprofloxacin Hcl) 5)  ! Cleocin (Clindamycin Hcl) 6)  ! Paxil (Paroxetine Hcl) 7)  ! Premarin (Estrogens Conjugated) 8)  ! Provera (Medroxyprogesterone Acetate) 9)  ! Tagamet Hb (Cimetidine) 10)  ! Crestor (Rosuvastatin Calcium) 11)  ! Doxycycline Hyclate (Doxycycline Hyclate) 12)  ! * Lisinopril 13)  ! Flexeril  Review of Systems      See HPI General:  Denies malaise. Eyes:  Denies blurring. CV:  Denies chest pain or discomfort. Resp:  Denies shortness of breath. GI:  Denies abdominal pain. MS:  Denies joint pain, joint redness, joint swelling, loss of strength, muscle weakness, and thoracic pain. Derm:  Denies rash. Neuro:  Denies headaches and numbness. Psych:  Denies anxiety and depression. Endo:  Denies cold intolerance and heat intolerance.  Physical Exam  General:  alert, well-developed, well-nourished, well-hydrated, and overweight-appearing. Head:   Normocephalic and atraumatic without obvious abnormalities. No apparent alopecia or balding.  Eyes:  Conjunctiva clear bilaterally.  Ears:  R ear normal and L ear normal.   Nose:  no external deformity.   Mouth:  good dentition.   Neck:  No deformities, masses, or tenderness noted. Lungs:  Normal respiratory effort, chest expands symmetrically. Lungs are clear to auscultation, no crackles or wheezes. Heart:  normal rate, regular rhythm, and no murmur.   Abdomen:  Bowel sounds positive,abdomen soft and non-tender without masses, organomegaly or hernias noted. Msk:  No deformity or scoliosis noted of thoracic or lumbar spine.   Extremities:  no edema, left foot- FROM, no ecyhmosis or swelling, no point tendnerness, normal gait. Neurologic:  sensation intact, alert & oriented X3 and gait normal.   Skin:  Intact without suspicious lesions or rashes Psych:  talkative, pleasant, normal affect, much less anxious today.   Impression & Recommendations:  Problem # 1:  CONTUSION, LEFT FOOT (ICD-924.20) Assessment New Resolving.  No further treatment necessary.  Problem # 2:  OTH GENERAL MEDICAL EXAMINATION ADMIN PURPOSES (ICD-V70.3) Assessment: New Form filled out, clearing them to participate in adoption process. Orders: Form Completion (81191)  Complete Medication List: 1)  Tylenol 325 Mg Tabs (Acetaminophen) .... As needed 2)  Alprazolam 0.25 Mg Tabs (Alprazolam) .... 1/2-1 tab by mouth two times a day as needed for anxiousness 3)  Buspirone Hcl 15 Mg Tabs (Buspirone hcl) .Marland Kitchen.. 1 tab by mouth daily.   Orders Added: 1)  Form Completion [99080] 2)  Est. Patient 46-64 years [99396] 3)  Est. Patient Level II [[address]]    Current Allergies (reviewed today): ! PENICILLIN V POTASSIUM (PENICILLIN V POTASSIUM) ! ERYTHROMYCIN BASE (ERYTHROMYCIN BASE) ! BENTYL (DICYCLOMINE HCL) ! CIPRO (CIPROFLOXACIN HCL) ! CLEOCIN (CLINDAMYCIN HCL) ! PAXIL (PAROXETINE HCL) ! PREMARIN (ESTROGENS  CONJUGATED) ! PROVERA (MEDROXYPROGESTERONE ACETATE) ! TAGAMET HB (CIMETIDINE) ! CRESTOR (ROSUVASTATIN CALCIUM) ! DOXYCYCLINE HYCLATE (DOXYCYCLINE HYCLATE) ! * LISINOPRIL ! FLEXERIL

## 2010-05-27 NOTE — Progress Notes (Signed)
Summary: alprazolam  Phone Note Refill Request Message from:  Patient on January 13, 2010 9:13 AM  Refills Requested: Medication #1:  ALPRAZOLAM 0.25 MG TABS 1/2-1 Tab by mouth two times a day as needed for anxiousness Refill request from [facility]. [phone]  Initial call taken by: Melody Comas,  January 13, 2010 9:19 AM  Follow-up for Phone Call        Rx called to pharmacy Follow-up by: Linde Gillis CMA Duncan Dull),  January 13, 2010 10:14 AM    Prescriptions: ALPRAZOLAM 0.25 MG TABS (ALPRAZOLAM) 1/2-1 Tab by mouth two times a day as needed for anxiousness  #30 x 0   Entered and Authorized by:   Ruthe Mannan MD   Signed by:   Ruthe Mannan MD on 01/13/2010   Method used:   Telephoned to ...       [facility] (retail)       [address]       [place], [place]  [address]       Ph: [phone]       Fax: [phone]   RxID:   [id number]

## 2010-05-27 NOTE — Assessment & Plan Note (Signed)
Summary: 30 MIN DISCUSS ANXIETY/CLE   Vital Signs:  Patient profile:   45 year old female Height:      64.50 inches Weight:      223.50 pounds BMI:     37.91 Temp:     98.0 degrees F oral Pulse rate:   84 / minute Pulse rhythm:   regular BP sitting:   140 / 82  (left arm) Cuff size:   large  Vitals Entered By: Linde Gillis CMA Duncan Dull) (Sep 17, 2009 8:15 AM) CC: 30 minute appt, discuss anxiety   History of Present Illness: 45 yo here to discuss anxiety.  Has had issues with anxiety since she was a teenager. When her mom died in 07-Sep-2001, was on multiple medications, including Paxil, Wellbutrin, Prozac.  All made her feel worse.  Gained 30 pounds on Prozac, Paxil made her feel more anxious.  Lately, more stress in her life.  Constantly worried, anxious, tearful. Takes Alprazolam every night just to fall asleep, mind is racing.  No SI or HI.   Denies any other symptoms of depression.  Current Medications (verified): 1)  Tylenol 325 Mg  Tabs (Acetaminophen) .... As Needed 2)  Vicodin 5-500 Mg  Tabs (Hydrocodone-Acetaminophen) .Marland Kitchen.. 1 Twice A Day By Mouth and As Needed 3)  Alprazolam 0.25 Mg Tabs (Alprazolam) .... 1/2-1 Tab By Mouth Two Times A Day As Needed For Anxiousness 4)  Advil 200 Mg Tabs (Ibuprofen) .... Otc As Directed. 5)  Tessalon 200 Mg Caps (Benzonatate) .... One Tab By Mouth Three Times A Day As Needed Fot Cough. 6)  Buspirone Hcl 15 Mg Tabs (Buspirone Hcl) .Marland Kitchen.. 1 Tab By Mouth Daily.  Allergies: 1)  ! Penicillin V Potassium (Penicillin V Potassium) 2)  ! Erythromycin Base (Erythromycin Base) 3)  ! Bentyl (Dicyclomine Hcl) 4)  ! Cipro (Ciprofloxacin Hcl) 5)  ! Cleocin (Clindamycin Hcl) 6)  ! Paxil (Paroxetine Hcl) 7)  ! Premarin (Estrogens Conjugated) 8)  ! Provera (Medroxyprogesterone Acetate) 9)  ! Tagamet Hb (Cimetidine) 10)  ! Crestor (Rosuvastatin Calcium) 11)  ! Doxycycline Hyclate (Doxycycline Hyclate) 12)  ! * Lisinopril  Review of Systems      See  HPI Psych:  Complains of anxiety, easily tearful, irritability, and panic attacks; denies alternate hallucination ( auditory/visual), depression, easily angered, mental problems, sense of great danger, suicidal thoughts/plans, thoughts of violence, unusual visions or sounds, and thoughts /plans of harming others.  Physical Exam  General:  alert, well-developed, well-nourished, well-hydrated, and overweight-appearing.   Psych:  talkative, pleasant, normal affect, moderately anxious   Impression & Recommendations:  Problem # 1:  ANXIETY /PANIC ATTACKS (ICD-300.00) Assessment Deteriorated Time spent with patient 25 minutes, more than 50% of this time was spent counseling patient on anxiety. Intolerant to several medications. Will try Buspar 15 mg daily, she will follow up with me in one month.  Her updated medication list for this problem includes:    Alprazolam 0.25 Mg Tabs (Alprazolam) .Marland Kitchen... 1/2-1 tab by mouth two times a day as needed for anxiousness    Buspirone Hcl 15 Mg Tabs (Buspirone hcl) .Marland Kitchen... 1 tab by mouth daily.  Complete Medication List: 1)  Tylenol 325 Mg Tabs (Acetaminophen) .... As needed 2)  Vicodin 5-500 Mg Tabs (Hydrocodone-acetaminophen) .Marland Kitchen.. 1 twice a day by mouth and as needed 3)  Alprazolam 0.25 Mg Tabs (Alprazolam) .... 1/2-1 tab by mouth two times a day as needed for anxiousness 4)  Advil 200 Mg Tabs (Ibuprofen) .... Otc as directed. 5)  Tessalon 200 Mg Caps (Benzonatate) .... One tab by mouth three times a day as needed fot cough. 6)  Buspirone Hcl 15 Mg Tabs (Buspirone hcl) .Marland Kitchen.. 1 tab by mouth daily.  Patient Instructions: 1)  Please make an appointment to come see me in one month (30 minute appointment). Prescriptions: BUSPIRONE HCL 15 MG TABS (BUSPIRONE HCL) 1 tab by mouth daily.  #30 x 0   Entered and Authorized by:   Ruthe Mannan MD   Signed by:   Ruthe Mannan MD on 09/17/2009   Method used:   Electronically to        [facility] (retail)        [address]       [place], [place]  [address]       Ph: [phone]       Fax: [phone]   RxID:   [id number]   Current Allergies (reviewed today): ! PENICILLIN V POTASSIUM (PENICILLIN V POTASSIUM) ! ERYTHROMYCIN BASE (ERYTHROMYCIN BASE) ! BENTYL (DICYCLOMINE HCL) ! CIPRO (CIPROFLOXACIN HCL) ! CLEOCIN (CLINDAMYCIN HCL) ! PAXIL (PAROXETINE HCL) ! PREMARIN (ESTROGENS CONJUGATED) ! PROVERA (MEDROXYPROGESTERONE ACETATE) ! TAGAMET HB (CIMETIDINE) ! CRESTOR (ROSUVASTATIN CALCIUM) ! DOXYCYCLINE HYCLATE (DOXYCYCLINE HYCLATE) ! * LISINOPRIL

## 2010-05-27 NOTE — Assessment & Plan Note (Signed)
Summary: BACK,L HIP,L LEG PAIN/CLE   Vital Signs:  Patient profile:   45 year old female Weight:      215 pounds Temp:     98.0 degrees F oral Pulse rate:   70 / minute Pulse rhythm:   regular BP sitting:   122 / 80  (left arm) Cuff size:   large  Vitals Entered By: Selena Batten Dance CMA Duncan Dull) (January 14, 2010 3:10 PM) CC: Back/hip/leg pain   History of Present Illness: CC: back/leg/hip pain  01/02/2010 - started with lower back pain, radiation to left hip, shooting down left leg (mainly hip to thigh but can go down to feet).  Now having pain shooting down lateral thigh as well as back of leg.  No paresthesias, no bowel/bladder incontinence, no fevers or chills.  Denies inciting event/trauma.  Not currently on anything.  Sees Dr. Wynetta Emery NSG for h/o DDD and L4/L5 fusion 2004.  Has been taking easy (not supposed to lift anything heavy since back surgery)  On disability because of back.  Does not like to take pills.  prefers PT.  No smoking.  Current Medications (verified): 1)  Tylenol 325 Mg  Tabs (Acetaminophen) .... As Needed 2)  Alprazolam 0.25 Mg Tabs (Alprazolam) .... 1/2-1 Tab By Mouth Two Times A Day As Needed For Anxiousness 3)  Buspirone Hcl 15 Mg Tabs (Buspirone Hcl) .Marland Kitchen.. 1 Tab By Mouth Daily. 4)  Phentermine Hcl 15 Mg Caps (Phentermine Hcl) .Marland Kitchen.. 1 Tab By Mouth Every Morning- One To Two Hours Before Breakfast, 1 Tab By Mouth 1-2 Hours Before Lunch.  Allergies: 1)  ! Penicillin V Potassium (Penicillin V Potassium) 2)  ! Erythromycin Base (Erythromycin Base) 3)  ! Bentyl (Dicyclomine Hcl) 4)  ! Cipro (Ciprofloxacin Hcl) 5)  ! Cleocin (Clindamycin Hcl) 6)  ! Paxil (Paroxetine Hcl) 7)  ! Premarin (Estrogens Conjugated) 8)  ! Provera (Medroxyprogesterone Acetate) 9)  ! Tagamet Hb (Cimetidine) 10)  ! Crestor (Rosuvastatin Calcium) 11)  ! Doxycycline Hyclate (Doxycycline Hyclate) 12)  ! * Lisinopril  Past History:  Past Medical History: Last updated:  11/09/2006 Anxiety GERD Hyperlipidemia Hypertension  Past Surgical History: Last updated: 11/09/2006 1988        Septoplasty  Nasal Fracture 1991        Tonsillectomy 1998        Choleycystectomy 06/29/99      Flexion L/S Series - mild degenerative changes 7/02         WNL 10/15/02    MRI L/S - Fusion L5-S1 07/09/02    Back surgery - Dr. Wynetta Emery   L4/5 Fusion 09/25/03      MRA Neck, ?stenosis  LICA   ? left vert. artery stenosis 09/25/03      MRI Brain with and without  WNL except mild sinusitis    MRA nml. 10/28/03      Angiogram Head/Neck nml.  Review of Systems       per HPI  Physical Exam  General:  alert, well-developed, well-nourished, well-hydrated, and overweight-appearing. Msk:  tender to palpation midlinespine lower lumbar vertebrae.  Tender to palpation left lateral lumbar paraspinous mm, no spasm/tightness.  mildly positive SLR L leg.  No pain with palpation of SI joint or Greater trochanter bilaterally.   Pulses:  2+ DP/PT bilaterally Extremities:  no edema Neurologic:  sensation intact BLE Skin:  Intact without suspicious lesions or rashes   Impression & Recommendations:  Problem # 1:  BACK PAIN, ACUTE (ICD-724.5) in patient with h/o laminectomy L4/L5  with fusion, possible osteophyte or disc bulging again?  No current red flags.  treat with steroid dose pack and send back to NSG for eval and ensure ok to start PT which is what patient likely will want to do.  no records from NSG in our system.  Her updated medication list for this problem includes:    Tylenol 325 Mg Tabs (Acetaminophen) .Marland Kitchen... As needed    Flexeril 5 Mg Tabs (Cyclobenzaprine hcl) ..... One by mouth two times a day with food as needed muscle pain (sedation precautions)  Complete Medication List: 1)  Tylenol 325 Mg Tabs (Acetaminophen) .... As needed 2)  Alprazolam 0.25 Mg Tabs (Alprazolam) .... 1/2-1 tab by mouth two times a day as needed for anxiousness 3)  Buspirone Hcl 15 Mg Tabs (Buspirone hcl) .Marland Kitchen.. 1  tab by mouth daily. 4)  Phentermine Hcl 15 Mg Caps (Phentermine hcl) .Marland Kitchen.. 1 tab by mouth every morning- one to two hours before breakfast, 1 tab by mouth 1-2 hours before lunch. 5)  Flexeril 5 Mg Tabs (Cyclobenzaprine hcl) .... One by mouth two times a day with food as needed muscle pain (sedation precautions) 6)  Prednisone 10 Mg Tabs (Prednisone) .... Take 3 for 3 days followed by 2 for 3 days followed by 1 for 3 days  Patient Instructions: 1)  I think you have inflammation of your back/bulging disk 2)  Treat with course of steroids, muscle relaxants as needed. 3)  Call Dr. Wynetta Emery for follow up prior to recommending PT.  If you can't get in with him, let us know and we will call for you. 4)  Good to see you today, call clinic with questions. Prescriptions: PREDNISONE 10 MG TABS (PREDNISONE) take 3 for 3 days followed by 2 for 3 days followed by 1 for 3 days  #18 x 0   Entered and Authorized by:   Eustaquio Boyden  MD   Signed by:   Eustaquio Boyden  MD on 01/14/2010   Method used:   Electronically to        [facility] (retail)       [address] [place], [place]  [address]       Ph: [phone]       Fax: [phone]   RxID:   [id number] FLEXERIL 5 MG TABS (CYCLOBENZAPRINE HCL) one by mouth two times a day with food as needed muscle pain (sedation precautions)  #20 x 0   Entered and Authorized by:   Eustaquio Boyden  MD   Signed by:   Eustaquio Boyden  MD on 01/14/2010   Method used:   Electronically to        [facility] (retail)       [address]       [place], [place]  [address]       Ph: [phone]       Fax: [phone]   RxID:   [id number]   Current Allergies (reviewed today): ! PENICILLIN V POTASSIUM (PENICILLIN V POTASSIUM) ! ERYTHROMYCIN BASE (ERYTHROMYCIN BASE) ! BENTYL (DICYCLOMINE HCL) ! CIPRO (CIPROFLOXACIN HCL) ! CLEOCIN (CLINDAMYCIN HCL) ! PAXIL (PAROXETINE HCL) !  PREMARIN (ESTROGENS CONJUGATED) ! PROVERA (MEDROXYPROGESTERONE ACETATE) ! TAGAMET HB (CIMETIDINE) ! CRESTOR (ROSUVASTATIN CALCIUM) ! DOXYCYCLINE HYCLATE (DOXYCYCLINE HYCLATE) ! * LISINOPRIL

## 2010-05-27 NOTE — Progress Notes (Signed)
Summary: wants different med   Phone Note Call from Patient Call back at Home Phone [phone]   Caller: Patient Call For: Shaune Leeks MD Summary of Call: Patient was given Z-pak when she was in to see Dr. Hetty Ely on 12 -28-10 and it helped her some, but she says that she is still not feeling 100% and still has alot of dranage. Wants to know if something else can be called in for her. I told her that she woudl probably need to be seen again and she says that she doesn't want to pay for anther office visit and that she has been dealing with this for over 8 weeks.  She uses [facility] as her pharmacy.  Initial call taken by: Melody Comas,  May 04, 2009 3:43 PM  Follow-up for Phone Call        I'm sorry but she would need to be seen again. I don't feel comfortable calling in a second abx without ever seeing her. Follow-up by: Ruthe Mannan MD,  May 04, 2009 4:14 PM     Appended Document: wants different med  Patient says she cannot pay for another OV.  She keeps coughing because it is the tail-end of the cold.  She could not get the tablets for coughing because her Medicare Part D would not cover it.  She asks if she could get Prednisone or something to help dry it up.?  Appended Document: wants different med  I don't typically prescribe prednisone at the tail end of a URI.  Her Zpack just got out of her system, and bronchitis can take weeks to resolve.  I would give it time.  Appended Document: wants different med  Patient notified as instructed by telephone.

## 2010-05-27 NOTE — Progress Notes (Signed)
Summary: refill request for alprazolam  Phone Note Refill Request Message from:  Fax from Pharmacy  Refills Requested: Medication #1:  ALPRAZOLAM 0.25 MG TABS 1/2-1 Tab by mouth two times a day as needed for anxiousness   Last Refilled: 04/13/2009 Faxed request from [facility], phone [phone].  Initial call taken by: Lowella Petties CMA,  June 17, 2009 11:24 AM  Follow-up for Phone Call        Medication phoned to pharmacy.  Follow-up by: Delilah Shan CMA Duncan Dull),  June 17, 2009 12:23 PM    Prescriptions: ALPRAZOLAM 0.25 MG TABS (ALPRAZOLAM) 1/2-1 Tab by mouth two times a day as needed for anxiousness  #30 x 0   Entered and Authorized by:   Ruthe Mannan MD   Signed by:   Ruthe Mannan MD on 06/17/2009   Method used:   Handwritten   RxID:   [id number]

## 2010-05-27 NOTE — Progress Notes (Signed)
Summary: side effects with buspar  Phone Note Call from Patient Call back at Home Phone [phone]   Caller: Patient Call For: Ruthe Mannan MD Summary of Call: Pt has been taking buspar for 3 weeks and she is having some nervousness and headaches.  She has had 2 migraines since starting it.  She is also having some weakness.  She states the medicine does help though with anxiety.  She is asking if there is anything she can do to lessen the side effects. Initial call taken by: Lowella Petties CMA,  October 08, 2009 1:01 PM  Follow-up for Phone Call        we could decrease the dose, other than that I am not sure of what else can lessen the side effects. Ruthe Mannan MD  October 08, 2009 1:19 PM  Patient advised as instructed via telephone. She will just wait it out and see how she feels.  Has an appt with Dr. Dayton Martes in a few weeks.  Advised patient to call us back if symptoms worsen.    Follow-up by: Linde Gillis CMA Duncan Dull),  October 08, 2009 2:20 PM

## 2010-05-27 NOTE — Progress Notes (Signed)
Summary: pt complains of back pain  Phone Note Call from Patient Call back at Home Phone [phone]   Caller: Patient Call For: Ruthe Mannan MD Summary of Call: Pt called complaining of low back pain, spasms.  She has appt to see Dr. Reece Agar tomorrow.  She is taking tylenol, not helping.  Advised ibuprofen and heat until her appt tomorrow. Initial call taken by: Lowella Petties CMA,  January 13, 2010 3:53 PM  Follow-up for Phone Call        thank you. Ruthe Mannan MD  January 14, 2010 7:45 AM

## 2010-05-27 NOTE — Assessment & Plan Note (Signed)
Summary: ? BRONCHITIS   Vital Signs:  Patient profile:   45 year old female Height:      64.5 inches Weight:      218.50 pounds BMI:     37.06 Temp:     97.8 degrees F oral Pulse rate:   88 / minute Pulse rhythm:   regular BP sitting:   122 / 88  (left arm) Cuff size:   large  Vitals Entered By: Delilah Shan CMA Duncan Dull) (June 16, 2009 12:03 PM) CC: ? bronchitis   History of Present Illness: Pt here for follow up persistent severe cough, productive white sputume, nasal congestion clear. She couldn't sleep last night due to throat congestion in the throat with difficulty breathing and wheezing.  Couldn't afford the Tessalon. Seen a few weeks ago for similar symptoms. Says this is ongoing for 2 months. Got a little better after the Zpack but then symptoms worsened after foster daughter got sick last week. No fevers, did have chills last night.  Allergic to multiple antibiotics.  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 As Needed 2)  Vicodin 5-500 Mg  Tabs (Hydrocodone-Acetaminophen) .Marland Kitchen.. 1 Twice A Day By Mouth and As Needed 3)  Alprazolam 0.25 Mg Tabs (Alprazolam) .... 1/2-1 Tab By Mouth Two Times A Day As Needed For Anxiousness 4)  Advil 200 Mg Tabs (Ibuprofen) .... Otc As Directed. 5)  Tessalon 200 Mg Caps (Benzonatate) .... One Tab By Mouth Three Times A Day 6)  Zithromax Z-Pak 250 Mg Tabs (Azithromycin) .... As  Dir  Current Medications (verified): 1)  Tylenol 325 Mg  Tabs (Acetaminophen) .... As Needed 2)  Vicodin 5-500 Mg  Tabs (Hydrocodone-Acetaminophen) .Marland Kitchen.. 1 Twice A Day By Mouth and As Needed 3)  Alprazolam 0.25 Mg Tabs (Alprazolam) .... 1/2-1 Tab By Mouth Two Times A Day As Needed For Anxiousness 4)  Advil 200 Mg Tabs (Ibuprofen) .... Otc As Directed. 5)  Azithromycin 250 Mg  Tabs (Azithromycin) .... 2 By  Mouth Today and Then 1 Daily For 4 Days 6)  Prednisone 20 Mg Tabs (Prednisone) .... 2 Tabs By Mouth Daily X 5 Days  Allergies: 1)  ! Penicillin V Potassium (Penicillin V Potassium) 2)  ! Erythromycin Base (Erythromycin Base) 3)  ! Bentyl (Dicyclomine Hcl) 4)  ! Cipro (Ciprofloxacin Hcl) 5)  ! Cleocin (Clindamycin Hcl) 6)  ! Paxil (Paroxetine Hcl) 7)  ! Premarin (Estrogens Conjugated) 8)  ! Provera (Medroxyprogesterone Acetate) 9)  ! Tagamet Hb (Cimetidine) 10)  ! Crestor (Rosuvastatin Calcium) 11)  ! Doxycycline Hyclate (Doxycycline Hyclate) 12)  ! * Lisinopril  Review of Systems      See HPI General:  Complains of chills; denies fever. Resp:  Complains of cough, sputum productive, and wheezing; denies shortness of breath.  Physical Exam  General:  alert, well-developed, well-nourished, well-hydrated, and overweight-appearing.  Non toxic and afebrile. Nose:  Mildly inflamed nares with copious clear discharge. Mouth:  Pharynx benign except thick, clear PND. Lungs:  normal respiratory effort, no intercostal retractions, no accessory muscle use, and normal breath sounds.  Minimal ronchi in the bases that clear with cough. Heart:  normal rate, regular rhythm, and no murmur.   Psych:  talkative, pleasant, normal affect.   Impression & Recommendations:  Problem # 1:  ACUTE BRONCHITIS (ICD-466.0) Assessment New Given duration and persistence of symptoms, will treat with short course of prednsione and another Zpack (multiple abx allergies). F/u if no improvement in 7-10 days.   Pt in agreement with plan. The following medications were removed from the medication list:    Tessalon 200 Mg Caps (Benzonatate) ..... One tab by mouth three times a day    Zithromax Z-pak 250 Mg Tabs (Azithromycin) .Marland Kitchen... As dir Her updated medication list for this problem includes:    Azithromycin 250 Mg Tabs (Azithromycin) .Marland Kitchen... 2 by  mouth today and then 1 daily for 4 days  Complete Medication List: 1)  Tylenol 325 Mg Tabs (Acetaminophen) .... As needed 2)  Vicodin 5-500 Mg Tabs (Hydrocodone-acetaminophen) .Marland Kitchen.. 1 twice a day by mouth and as needed 3)  Alprazolam 0.25 Mg Tabs (Alprazolam) .... 1/2-1 tab by mouth two times a day as needed for anxiousness 4)  Advil 200 Mg Tabs (Ibuprofen) .... Otc as directed. 5)  Azithromycin 250 Mg Tabs (Azithromycin) .... 2 by  mouth today and then 1 daily for 4 days 6)  Prednisone 20 Mg Tabs (Prednisone) .... 2 tabs by mouth daily x 5 days Prescriptions: PREDNISONE 20 MG TABS (PREDNISONE) 2 tabs by mouth daily x 5 days  #10 x 0   Entered and Authorized by:   Ruthe Mannan MD   Signed by:   Ruthe Mannan MD on 06/16/2009   Method used:   Electronically to        [facility] (retail)       [address]       [place], [place]  [address]       Ph: [phone]       Fax: [phone]   RxID:   [id number] AZITHROMYCIN 250 MG  TABS (AZITHROMYCIN) 2 by  mouth today and then 1 daily for 4 days  #6 x 0   Entered and Authorized by:   Ruthe Mannan MD   Signed by:   Ruthe Mannan MD on 06/16/2009   Method used:   Electronically to        [facility] (retail)       [address]       [place], [place]  [address]       Ph: [phone]       Fax: [phone]   RxID:   [id number]   Current Allergies (reviewed today): ! PENICILLIN V POTASSIUM (PENICILLIN V POTASSIUM) ! ERYTHROMYCIN BASE (ERYTHROMYCIN BASE) ! BENTYL (DICYCLOMINE HCL) ! CIPRO (CIPROFLOXACIN HCL) ! CLEOCIN (CLINDAMYCIN  HCL) ! PAXIL (PAROXETINE HCL) ! PREMARIN (ESTROGENS CONJUGATED) ! PROVERA (MEDROXYPROGESTERONE ACETATE) ! TAGAMET HB (CIMETIDINE) ! CRESTOR (ROSUVASTATIN CALCIUM) ! DOXYCYCLINE HYCLATE (DOXYCYCLINE HYCLATE) ! * LISINOPRIL

## 2010-05-27 NOTE — Progress Notes (Signed)
Summary: cant tolerate flexeril  Phone Note Call from Patient Call back at Home Phone [phone]   Caller: Patient Summary of Call: Pt was given flexeril last week for back pain.  She says she cant take this, made her very groggy and weak.  Also, she was told to stop her phentermine when she was having so much pain so she is asking when to restart this. Initial call taken by: Lowella Petties CMA,  January 18, 2010 10:29 AM  Follow-up for Phone Call        All muscle relaxants cause these symptoms.  Ok to restart phenteramine when she feels better. Ruthe Mannan MD  January 18, 2010 10:39 AM  Patient advised as instructed via telephone.  She stated that she will just stop the Flexeril and take Ibuprofen.  Rescheduled her f/u appt for 02/11/2010.    Follow-up by: Linde Gillis CMA Duncan Dull),  January 18, 2010 10:50 AM

## 2010-05-27 NOTE — Progress Notes (Signed)
Summary: refill request for alprazolam  Phone Note Refill Request Message from:  Fax from Pharmacy  Refills Requested: Medication #1:  ALPRAZOLAM 0.25 MG TABS 1/2-1 Tab by mouth two times a day as needed for anxiousness   Last Refilled: 06/17/2009 Faxed refill request from [facility], phone [phone].  Initial call taken by: Lowella Petties CMA,  Sep 07, 2009 4:35 PM    Prescriptions: ALPRAZOLAM 0.25 MG TABS (ALPRAZOLAM) 1/2-1 Tab by mouth two times a day as needed for anxiousness  #30 x 0   Entered and Authorized by:   Ruthe Mannan MD   Signed by:   Ruthe Mannan MD on 09/08/2009   Method used:   Telephoned to ...       [facility] (retail)       [address]       [place], [place]  [address]       Ph: [phone]       Fax: [phone]   RxID:   [id number]   Appended Document: refill request for alprazolam Rx called to pharmacy.

## 2010-05-27 NOTE — Progress Notes (Signed)
Summary: having stomach cramps  Phone Note Call from Patient Call back at Home Phone [phone]   Caller: Patient Call For: Dr. Dayton Martes  Summary of Call: Patient was seen yesterday and says that she has never taken this high  a dose of prednisone and she says that it is causing her stomach to cramp. She wants to know what she can do for the cramps becuase she wants to continue the med. She also says that she was not stopped up yesterday, but now she is and wants to know what you think she should take for that.  [facility] is the pharmacy she uses.  Initial call taken by: Melody Comas,  June 17, 2009 9:10 AM  Follow-up for Phone Call        Take Mucinex for congestion, please ask her to back off prednisone to one tablet (not two) daily.  That will help her stomach.  Still an appropriate treatment dose. Follow-up by: Ruthe Mannan MD,  June 17, 2009 9:27 AM  Additional Follow-up for Phone Call Additional follow up Details #1::        LMOM to call.          Lowella Petties CMA  June 17, 2009 10:18 AM  Advised pt. Additional Follow-up by: Lowella Petties CMA,  June 17, 2009 10:29 AM

## 2010-06-07 ENCOUNTER — Telehealth: Payer: Self-pay | Admitting: Family Medicine

## 2010-06-16 NOTE — Progress Notes (Signed)
Summary: alprazolam   Phone Note Refill Request Message from:  Fax from Pharmacy on June 07, 2010 1:42 PM  Refills Requested: Medication #1:  ALPRAZOLAM 0.25 MG TABS 1/2-1 Tab by mouth two times a day as needed for anxiousness   Last Refilled: 02/24/2010 Refill request from [facility]. [phone]  Initial call taken by: Melody Comas,  June 07, 2010 1:44 PM  Follow-up for Phone Call        Rx called to pharmacy Follow-up by: Linde Gillis CMA Duncan Dull),  June 07, 2010 2:21 PM    Prescriptions: ALPRAZOLAM 0.25 MG TABS (ALPRAZOLAM) 1/2-1 Tab by mouth two times a day as needed for anxiousness  #30 x 0   Entered and Authorized by:   Ruthe Mannan MD   Signed by:   Ruthe Mannan MD on 06/07/2010   Method used:   Telephoned to ...       [facility] (retail)       [address]       [place], [place]  [address]       Ph: [phone]       Fax: [phone]   RxID:   [id number]

## 2010-08-04 LAB — DIFFERENTIAL
Basophils Absolute: 0 10*3/uL (ref 0.0–0.1)
Basophils Relative: 0 % (ref 0–1)
Monocytes Absolute: 0.7 10*3/uL (ref 0.1–1.0)
Neutro Abs: 7.9 10*3/uL — ABNORMAL HIGH (ref 1.7–7.7)
Neutrophils Relative %: 77 % (ref 43–77)

## 2010-08-04 LAB — CBC
MCHC: 34.8 g/dL (ref 30.0–36.0)
MCV: 84.7 fL (ref 78.0–100.0)
WBC: 10.2 10*3/uL (ref 4.0–10.5)

## 2010-08-04 LAB — BASIC METABOLIC PANEL
Creatinine, Ser: 0.72 mg/dL (ref 0.4–1.2)
GFR calc Af Amer: >60 mL/min (ref >60)
Potassium: 3.7 mEq/L (ref 3.5–5.1)
Sodium: 138 mEq/L (ref 135–145)

## 2010-08-10 ENCOUNTER — Other Ambulatory Visit: Payer: Self-pay | Admitting: *Deleted

## 2010-08-10 MED ORDER — ALPRAZOLAM 0.25 MG PO TABS: ORAL_TABLET | ORAL | Status: DC

## 2010-08-11 NOTE — Telephone Encounter (Signed)
Rx called to pharmacy

## 2010-08-12 ENCOUNTER — Other Ambulatory Visit: Payer: Self-pay | Admitting: Family Medicine

## 2010-08-25 NOTE — Telephone Encounter (Signed)
Rx called to pharmacy

## 2010-09-07 NOTE — Consult Note (Signed)
NAMEDONIESHA, LANDAU                ACCOUNT NO.:  [id number]   MEDICAL RECORD NO.:  [id number]          PATIENT TYPE:  EMS   LOCATION:  ED                            FACILITY:  [facility]   PHYSICIAN:  Kofi A. Gerilyn Pilgrim, M.D. DATE OF BIRTH:  April 17, 1966   DATE OF CONSULTATION:  DATE OF DISCHARGE:                                 CONSULTATION   REASON FOR CONSULTATION:  Facial weakness and numbness.   The patient is a 45 year old white female who has a baseline history of  frequent episodic headaches.  The headaches, however, are typically  located in the frontal region.  About 3 days ago the patient developed  the relative acute onset of posterior right headache.  She described the  headache as a severe stabbing type pain.  The patient developed  periorbital numbness the following day, mostly on the right side.  The  numbness is associated with some blurred vision, though this is less  problematic.  She also had a spell where the muscles on the right  contracted.  These are the muscles involving the lower right facial  area.   The patient does have a history of hypertension and dyslipidemia and  both are apparently well controlled.  The patient has been followed by a  primary care physician for these problems but was told to come to the  hospital after developing the periorbital numbness.   PAST MEDICAL HISTORY:  1. Hyperlipidemia, apparently diet-controlled.  2. Hypertension.  3. Gastroesophageal reflux disease.  4. Migraines and other episodic headache syndromes.  5. Panic disorder.   PAST SURGICAL HISTORY:  1. Lumbar laminectomy.  2. Cholecystectomy.  3. Septoplasty.   SOCIAL HISTORY:  She is married.  Her husband is a Naval architect.  No  alcohol use.  No illicit drug use.  No tobacco use.   ALLERGIES:  She has a long list of allergies including:  1. CIPRO.  2. PENICILLIN.  3. ERYTHROMYCIN.  4. TAGAMET.  5. ZESTRIL.  6. ALBUTEROL.  7. DOXYCYCLINE.  8. CRESTOR.  9.  PROVERA.  10.PREMARIN.  11.PAXIL.  12.CLEOCIN.  13.BENTYL.  14.WELLBUTRIN.  15.PREDNISONE.   HOME MEDICATIONS:  Inderal 40 mg b.i.d.   REVIEW OF SYSTEMS:  The patient does not report double vision but blurry  vision.  She does not report having weakness of the extremities or other  symptoms involving the extremities such as numbness.   PHYSICAL EXAMINATION:  VITAL SIGNS:  Afebrile, blood pressure is  controlled 134/87, pulse 76, respirations 17.  GENERAL:  Shows an obese lady in no acute distress.  HEENT:  Neck is supple.  Head is normocephalic, atraumatic.  ABDOMEN:  Soft.  EXTREMITIES:  No significant edema.  MENTATION:  She is awake and alert.  She converses well.  Speech,  language, and cognition are intact.  CRANIAL NERVES:  Pupils are equal, round, reactive to light and  accommodation.  She is noted to have sustained end gaze nystagmus to the  left.  No nystagmus noted on the right through all the positions.  I did  elicit diplopia with the patient looking  to the left.  Vertical eye  movements were normal.  Visual fields were intact.  Facial muscle  strength is symmetric.  Tongue is midline.  Uvula midline.  Shoulder  shrug is normal.  MOTOR:  Shows normal tone, bulk, and strength.  There is no pronator  drift.  COORDINATION:  Intact tested by multiple maneuvers including rapid  alternating maneuver, heel-to-shin, finger-to-nose.  No tremors or  parkinsonism is noted.  REFLEXES:  Preserved.  SENSATION:  Show impaired light touch and temperature around the mouth,  mostly on the right side.  Gait is normal.   Initial head CT scan shows nothing acute.   IMPRESSION:  1. Acute onset of unusual posterior headache on the right associated      with periorbital numbness and nystagmus.  The constellation of      symptoms is worrisome for a small vessel lacunar type infarct      involving the posterior circulation.  The patient actually has good      facial muscles and  therefore she does not have Bell's palsy.  She      may have slight flattening of the nasolabial fold on the left side.  2. Hypertension.  3. Obesity.  4. Dyslipidemia.   RECOMMENDATIONS:  1. MRI/MRA of the brain.  2. Carotid Doppler study.  3. Aspirin 81 mg.   The patient may follow up with me in the office next week.  Thanks for  this consultation.      Kofi A. Gerilyn Pilgrim, M.D.  Electronically Signed     KAD/MEDQ  D:  06/08/2007  T:  06/08/2007  Job:  [id number]

## 2010-09-07 NOTE — Assessment & Plan Note (Signed)
[facility]                                 ON-CALL NOTE   TAL, NEER                         MRN:          [id number]  DATE:06/02/2007                            DOB:          12-01-1965    TIME OF CALL:  3:35 p.m.   CALLER:  Cassandra Mcconnell.   She sees Dr. Hetty Ely.   PHONE NUMBER:  [phone]   The patient is calling to report side effects of a new blood pressure  medication.  She states she has allergic reactions to a lot of other  medications, and that this one is similar.  She took her first 5 mg dose  of lisinopril last night.  When she woke up this morning, she had red  itchy blotches of skin all over her body, and also had soreness on the  roof of her mouth.  It has continued throughout the day, although it has  not gotten worse.  There is no shortness of breath.  My advice is to  stop the lisinopril, and she can take Benadryl 1 or 2 every 4 hours as  needed.  She should contact Dr. Hetty Ely Monday to let him know what  happened, and so he can offer her an alternative medication.  She can  call back if there are further problems.     Tera Mater. Clent Ridges, MD  Electronically Signed    SAF/MedQ  DD: 06/02/2007  DT: 06/04/2007  Job #: [id number]

## 2010-09-10 NOTE — Assessment & Plan Note (Signed)
[facility]                                 ON-CALL NOTE   [name], [name]                         MRN:          [id number]  DATE:04/23/2006                            DOB:          [birth year]    Phone number is [phone].  This is a Dr. Hetty Ely patient.  She says she  is age 45.  The patient has been having problems most recently with  constipation and rectal pain with occasional spotting of blood, and she  has had some difficulty defecating with no associated fever, vomiting,  or other significant abdominal pain.  She had taken just a stool  softener today and has just been sitting in a hot tub, wondering if  there are other things that she can do.  She has no systemic disease  otherwise.  After report, I recommended to go ahead and use MiraLax,  over-the-counter Anusol, and be seen in the office tomorrow.  She will  probably go ahead and use some Ex-Lax today.  She has this at home and  is unable to get to the drug store today.  Otherwise, she should call  back if needed.     Neta Mends. Panosh, MD  Electronically Signed    WKP/MedQ  DD: 04/23/2006  DT: 04/23/2006  Job #: [id number]

## 2010-09-10 NOTE — Group Therapy Note (Signed)
NAME:  Cassandra Mcconnell, Cassandra Mcconnell NO.:  [id number]   MEDICAL RECORD NO.:  [id number]                   PATIENT TYPE:  OUT   LOCATION:  [facility]                           FACILITY:  [facility]   PHYSICIAN:  Argentina Donovan, MD                     DATE OF BIRTH:  Nov 26, 1965   DATE OF SERVICE:  05/01/2003                                    CLINIC NOTE   CHIEF COMPLAINT:  Prolonged vaginal bleeding and severe left-sided abdominal  pain.   SUBJECTIVE:  Cassandra Mcconnell is a G0 who complains of daily vaginal bleeding for  7 months.  Cassandra Mcconnell last normal menses was in May 2004 and consisted of normal  bleeding of 3-5 days in length.  However, for the past 7 months she has  blood daily, sometimes heavily, sometimes just spotting, and always of  brownish blood.  For the past 3 days she has had left lower quadrant pain  which is exacerbated by movement and not associated with nausea, vomiting,  diarrhea or constipation, or blood in Cassandra Mcconnell bowel movement.  No fever or  chills.  Both the flow and the pain eased up this morning.  She has not  taken anything for the pain.   [name] began menstruating at 45 years of age but never had a regular  pattern.  She would sometimes go 2-3 months without having a period.  She  also has a history of bleeding for 6-8 weeks in the past but never longer  than that.  She tried to take oral contraceptives in the past but could not  tolerate them secondary to nausea and vomiting.  She also has a history of  infertility and in the 1990s was seen [facility] where she had presumably a  hysterosalpingogram which showed no abnormalities other than a small left  ovary.  She also has a history of taking fertility drugs when under the  care of Dr. Elliot Gault, whom she has not seen in the past 5 years.   PAST MEDICAL HISTORY:  The patient reports unipolar depression and severe  anxiety, both high cholesterol and high blood pressure.  She has no history  of GYN  surgeries.   FAMILY HISTORY:  No history of endometrial cancer but Cassandra Mcconnell mother did have  diabetes.  Further history is available on the sheet the patient filled out.   SOCIAL HISTORY:  The patient denies any tobacco or alcohol use.  She and Cassandra Mcconnell  husband are currently in the process of adopting a child.  She is not  employed.   REVIEW OF SYSTEMS:  No history of thyroid problems or urinary complaints.  The patient does report hirsutism as well as acne.   OBJECTIVE:  VITAL SIGNS:  Noted with a weight of 230 pounds.  GENERAL:  She is an obese, pleasant, middle-aged white female, no acute  distress.  ABDOMEN:  Obese with a  panus.  Normal bowel sounds.  No masses but severe  left lower quadrant tenderness to palpation and moderate left upper quadrant  tenderness to palpation.  No rebound or masses.  BACK:  No CVA tenderness.   ASSESSMENT AND PLAN:  1. Dysfunctional uterine bleeding.  The patient is scheduled for a pelvic     ultrasound to evaluate Cassandra Mcconnell endometrial stripe on May 12, 2003 at 1:15     p.m.  This ultrasound will also evaluate Cassandra Mcconnell left lower quadrant pain.     The patient was given a prescription for Provera 10 mg p.o. b.i.d. x5     days as well as a prescription for Ortho Evra patch which she is to begin     on the Sunday after completing the Provera.  Based on the results of the     pelvic ultrasound may consider an endometrial biopsy.  2. Possible polycystic ovary syndrome.  The patient has a history of     infertility, irregular menses, hirsutism, obesity, and acne, and likely     has PCOS.  This should be followed up at our next visit with possible     treatment with metformin.  3. Left lower quadrant abdominal pain tenderness.  The patient's pain has     subsided.  Encouraged ibuprofen as needed.  Ultrasound to evaluate this     pain.  No red flags on history so just possible this related to Cassandra Mcconnell     abnormal bleeding.  Will follow up on this at next visit.      Georgina Peer, MD                   Argentina Donovan, MD    JM/MEDQ  D:  05/01/2003  T:  05/01/2003  Job:  [id number]

## 2010-09-10 NOTE — Consult Note (Signed)
NAME:  Cassandra Mcconnell, SCHOPPE                          ACCOUNT NO.:  [id number]   MEDICAL RECORD NO.:  [id number]                    PATIENT TYPE:  OIB   LOCATION:                                       FACILITY:  [facility]   PHYSICIAN:  Jonelle Sports. Frazier Richards, M.D.            DATE OF BIRTH:  February 03, 1966   DATE OF CONSULTATION:  DATE OF DISCHARGE:                                   CONSULTATION   REASON FOR CONSULTATION:  Interpretation of intracranial views during  carotid and vertebral arterial injections.   HISTORY OF PRESENT ILLNESS:  Reported history of symptoms of syncope and  presyncope, hypertension, and elevated cholesterol.  Reportedly prior MRI  raised a question of an anterior communicating artery aneurysm 2 to 3 mm in  diameter.   FINDINGS:  Right common carotid artery injection reveals fetal origin of the  right posterior cerebral artery from the right ICA, a normal anatomic  variant.  Cavernous supraclinoid ICA segments unremarkable.  No intracranial  arterial occlusions or high grade stenoses.  No evidence of an aneurysm of  the right carotid injection.  Left carotid arteriogram also reveals no  evidence of an aneurysm; however, note that to completely evaluate for  anterior communicating artery aneurysm, transorbital oblique views with  cross compression may be necessary.  Probable fetal origin of the left PCA  as well as the right PCA.  Subclavian injections to allow vertebral artery  opacification and then filling of the posterior circulation branches were  performed.  Distal vertebral and basilar arteries and their branches appear  patent, although somewhat attenuated probably on a congenital basis.  No  definite aneurysm.   IMPRESSION:  No intracranial arterial occlusions or high grade stenoses.  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  No focal  vertebral basal stenosis.  See counts above.                                               Jonelle Sports.  Frazier Richards, M.D.    RES/MEDQ  D:  10/30/2003  T:  10/31/2003  Job:  [id number]

## 2010-09-10 NOTE — Op Note (Signed)
NAME:  Cassandra Mcconnell, Cassandra Mcconnell                          ACCOUNT NO.:  [id number]   MEDICAL RECORD NO.:  [id number]                   PATIENT TYPE:  OIB   LOCATION:  2899                                 FACILITY:  [facility]   PHYSICIAN:  Janetta Hora. Fields, MD               DATE OF BIRTH:  15-Jan-1966   DATE OF PROCEDURE:  10/28/2003  DATE OF DISCHARGE:                                 OPERATIVE REPORT   PROCEDURE:  1. Arch aortogram.  2. Four vessel cerebral angiogram.   PREOPERATIVE DIAGNOSIS:  Vertebral artery stenosis.   POSTOPERATIVE DIAGNOSIS:  Vertebral artery stenosis.   ANESTHESIA:  Local.   INDICATIONS FOR PROCEDURE:  The patient has a history of presyncopal  episodes.  She recently had an MRA scan which showed a suggestion of  bilateral vertebral artery stenosis or dissection.  She now presents for  arch aortogram and cerebral angiogram.   DESCRIPTION OF PROCEDURE:  After obtaining informed consent from the patient  in which she was informed of risks, benefits, and possible complications of  the procedure including, but not limited to:  Stroke, bleeding, infection,  and arterial injury, she was brought to the [facility] lab.  Next, the patient's  right groin was prepped and draped in the usual sterile fashion.  Local  anesthesia was infiltrated over the right common femoral artery.  Next, a  Majestic needle was used to cannulate the right common femoral artery and a  0.035 Wooley wire advanced into the abdominal aorta under fluoroscopic  guidance.  The needle was removed and a 5 French sheath placed over the  guide wire into the right common femoral artery.  This was then durally  flushed.  Next, the guide wire was advanced under fluoroscopic guidance with  a 5 French pigtail catheter threaded over this into the aortic arch.  Arch  aortogram was then obtained which shows normal takeoff of the innominate  artery with normal origin of the right common carotid and right subclavian  arteries.   The origin of the left common carotid artery and the left carotid  bifurcation is widely patent.  The left subclavian artery origin is widely  patent.  The takeoff of the vertebral artery is not well visualized  initially on the arch aortogram, but the distal vertebral artery is widely  patent.  The distal and proximal right vertebral artery are of normal origin  and without significant stenosis.   Next the pigtail catheter was pulled back over the guide wire and an H1  catheter was then used to selectively catheterize the right subclavian  artery.  An injection was then performed in the right subclavian artery to  fully visualize the right vertebral artery.  The right vertebral artery is  visualized all the way to the skull base with no significant stenosis  throughout its course.  Oblique views were also performed to view the origin  of the right  vertebral artery and there was no stenosis in this location  either.  Intracranial views through this injection were also obtained and  these will be interpreted by the neuroradiologist.   Next several attempts were made to selectively cannulate the right common  carotid artery without success.  Therefore an innominate injection was  obtained to assess the right common carotid artery.  This shows the origin  of the right common carotid artery to be widely patent.  This was done in an  AP and lateral projection.  There is no significant stenosis at the carotid  bifurcation or of the external or internal carotid artery.  Intracerebral  views were also performed which will be interpreted by the neuroradiologist.  Next, the H1 catheter was pulled back down into the aortic arch.  The left  subclavian artery was then selectively cannulated and an injection of the  proximal left subclavian artery was performed to view the vertebral artery.  This was widely patent in its origin and throughout its course, this was  slightly larger than the right  vertebral artery.  Next, the catheter was  pulled back over a guide wire back into the aortic arch, and the left common  carotid artery was selectively cannulated.  An injection was then performed  in an AP and lateral projection which shows no significant stenosis  throughout the common carotid, internal carotid, or external carotid. These  are widely patent all the way to the level of the skull base.  Intracerebral  views were also obtained on an AP and lateral projection and these will be  interpreted by the neuroradiologist.  Next, the catheter was pulled back  over a guide wire down through the sheath.  The sheath was then removed and  hemostasis obtained with direct pressure.  The patient tolerated the  procedure well and there were no complications.  The patient was taken to  the recovery room in stable condition.   FINDINGS:  1. Normal arch anatomy.  2. Normal right and left carotid, subclavian, and vertebral arteries with no     significant stenosis.                                               Janetta Hora. Fields, MD    CEF/MEDQ  D:  10/28/2003  T:  10/28/2003  Job:  [id number]

## 2010-09-10 NOTE — Op Note (Signed)
NAME:  Cassandra Mcconnell, Cassandra Mcconnell                          ACCOUNT NO.:  [id number]   MEDICAL RECORD NO.:  [id number]                   PATIENT TYPE:  INP   LOCATION:  3005                                 FACILITY:  [facility]   PHYSICIAN:  Donalee Citrin, M.D.                     DATE OF BIRTH:  10-Sep-1965   DATE OF PROCEDURE:  07/09/2002  DATE OF DISCHARGE:                                 OPERATIVE REPORT   PREOPERATIVE DIAGNOSES:  1. Severe degenerative disk disease, L5-S1, naming S1 the transitional level     with a rudimentary disk at  S1-2.  1. Severe low back, diskogenic, mechanical in nature, and bilateral S1     radiculopathy.   POSTOPERATIVE DIAGNOSES:  1. Severe degenerative disk disease, L5-S1, naming S1 the transitional level     with a rudimentary disk at  S1-2.  1. Severe low back, diskogenic, mechanical in nature, and bilateral S1     radiculopathy.   OPERATION PERFORMED:  Decompressive laminectomy L5-S1, posterior lumbar  interbody fusion, L5-S1 using 10 x 26 mm tangent allograft wedges. Pedicle  screw fixation using the M10 pedicle screw system with 6.5 x 40 pedicle  screws at L5, 6.5 x 35 on the right at S1 and 6.5 x 30 on the left at S1.  Posterolateral arthrodesis using locally harvested autograft L5-S1, medial  fascia, medium Hemovac drain.   SURGEON:  Donalee Citrin, M.D.   ASSISTANT:  Dr. Hope Pigeon.   ANESTHESIA:  General endotracheal.   INDICATIONS FOR PROCEDURE:  The patient is a very pleasant female with  longstanding back and leg pain radiating down the outside of the foot and  top of the foot and the sole of the foot, numbness, and tingling.  It seems  the patient is refractory to all modalities of conservative treatment. Her  pain was diskogenic and mechanical in nature, was predominantly in her back,  secondarily in her back, worse when going from lying to sitting and sitting  to standing position.  Preoperative imaging showed severe degenerative disk  disease with  a large central disk  bulge and degenerative collapse and  beginnings of end plate changes.  The patient was recommended decompressive  laminectomy and fusion.  I extensively went over the risks and benefits of  surgery with her and she understands and agreed to proceed forward.   DESCRIPTION OF PROCEDURE:  The patient was brought to the operating room,  was induced under general anesthesia, placed prone on Wilson frame, back was  prepped and draped in the usual sterile fashion.  Preoperative x-ray  localized the L4-5 disk space.  A midline incision was made and Bovie  electrocautery was used to take down subcutaneous tissues.  Subperiosteal  dissection carried out to lamina of L4, 5 and S1 bilaterally.  Also exposing  the transverse processes of L5 and S1.  Self-retaining retractor was placed  and radical decompressive laminectomy was begun.  The entire spinous  process, lamina complex and medial facet complex was removed with a  combination of 3 and 4 mm Kerrison punch and Leksell rongeur.  Both L5 and  S1 nerve roots were identified and radically decompressed out their  foramina.  The epidural veins were coagulated over the disk space.  Using a  D'Errico retractor, the right S1 nerve root was reflected medially.  Annulotomy was done, disk space was radically cleaned out and a 10  distractor was inserted on the right.  Then on the left side, the disk space  was also cleaned out in a similar fashion, cut with a medium-sized cutter  and a medium chisel and a 10 x 26 mm tangent allograft wedge was inserted on  the left side.  Fluoroscopy confirmed the depth and trajectory of each step  along the way.  On the right side, the distractor was removed, the disk  space was cleaned out again.  A central disk was scraped around with down  going Epstein curet.  Again, the medium cutting chisel used to prepare the  end plates and locally harvested autograft was packed against the left side  allograft  and the right side allograft was inserted in a similar fashion.  Both allografts were approximately 1 cm deep to the posterior vertebral body  line confirmed by fluoroscopy and direct inspection.  Then attention was  taken to pedicle screw placement.  The pilot holes were drilled with a high  speed drill on the right at L5.  The medial aspect of the pedicle was  evaluated from within the canal and no medial breach was appreciated.  The  pedicle was cannulated, probed, tapped with a 5.5 tap and a 6.5 x 40 pedicle  screw inserted on the right.  Fluoroscopy again confirmed the depth and  trajectory.  Then on the right side at S1, again the procedure was repeated;  however, this pedicle was overtapped and the probe was noted to have broken  through the cortex at the lateral margin of the vertebral body.  This was  felt to be due to the small S1 vertebral body, so this was free cannulated  with trajectory slightly more medial as well as retapped, probed and a 6.5 x  30 pedicle screw was inserted with competent pedicle in a 360 degree  orientation all steps along away.  Then attention was taken to the left  side.  The left L5 pedicle screw was inserted in a similar fashion.  The  left S1 pedicle screw was also inserted in similar fashion.  Initially  during the tapping a small medial breach was appreciated and this was  realigned further laterally and noted to have the pedicle be competent in  360 degree orientation, again retapped and a 6.5 x 30 pedicle screw inserted  on the left side.  Then a 30 mm rod was put on the patient's side and a 40  on the left.  Pedicle screws at S1 were tightened down.  The L5 pedicle  screw was compressed against S1 and all ______ nuts were tightened down.  Then the lateral gutters and transverse processes had been already  decorticated and the locally harvested autograft had been packed against the lateral gutters and transverse processes.  Then all neural foramina  were re-  explored with the hockey stick and nerve hook and all patent nerve roots  were seen to be widely decompressed.  Gelfoam was laid  atop of the dura.  A  medium Hemovac drain was placed.  Muscle and fascia were reapproximated with  0 interrupted Vicryl.  The subcutaneous tissue was closed with 2-0  interrupted Vicryl and the skin was closed with running 4-0 subcuticular.  Benzoin and Steri-Strips were applied.  The patient was taken to the  recovery room in stable condition.  At the end of the case, all needle  counts and sponge counts correct.                                               Donalee Citrin, M.D.    GC/MEDQ  D:  07/09/2002  T:  07/09/2002  Job:  [id number]

## 2010-09-10 NOTE — Discharge Summary (Signed)
   NAME:  Cassandra Mcconnell, Cassandra Mcconnell                          ACCOUNT NO.:  [id number]   MEDICAL RECORD NO.:  [id number]                   PATIENT TYPE:  INP   LOCATION:  3005                                 FACILITY:  [facility]   PHYSICIAN:  Donalee Citrin, M.D.                     DATE OF BIRTH:  10/29/1965   DATE OF ADMISSION:  07/09/2002  DATE OF DISCHARGE:  07/12/2002                                 DISCHARGE SUMMARY   ADMISSION DIAGNOSIS:  Discogenic mechanical low back pain and spinal  instability.  S1 radiculopathy.   DISCHARGE DIAGNOSIS:  Discogenic mechanical low back pain and spinal  instability.  S1 radiculopathy.   OPERATION PERFORMED:  Placement of lumbar interbody fusion L5-S1.   SURGEON:  Donalee Citrin, M.D.   ASSISTANTHope Pigeon.   HOSPITAL COURSE:  The patient is a very pleasant 45 year old female who has  had longstanding back and leg pain with severe degenerative disease and  large ruptured disk at L5-S1.  The patient had been preoperatively  recommended stabilization and fusion.  The patient was admitted and went to  the operating room  for the above-mentioned procedure.  Postoperatively, the  patient did very well and went to the floor.  On the floor, the patient was  doing very well with complete resolution of her leg pain, was afebrile and  mobilizing progressively.  Over the first day his Hemovac put out .  This was left in an additional day and discontinued on postoperative day 2.  The patient progressively mobilized with physical therapy and occupational  therapy, had a little bit of difficulty managing her pain and the pain  management was making her too sedated.  These were weaned off and the  patient progressively got more alert as she progressively mobilized with  physical therapy.  Her Foley was able to be taken out.  Her IV was hep-  locked and she was able to be mobilized and voiding spontaneously and at  time of discharge, the pain was controlled on p.o.  Percocet.  At time of  discharge the patient was neurologically stable.  The wound was clean and  dry and she was discharged to home.                                               Donalee Citrin, M.D.    GC/MEDQ  D:  08/30/2002  T:  09/02/2002  Job:  [id number]

## 2010-09-17 ENCOUNTER — Ambulatory Visit (INDEPENDENT_AMBULATORY_CARE_PROVIDER_SITE_OTHER): Payer: Medicare HMO | Admitting: Family Medicine

## 2010-09-17 ENCOUNTER — Encounter: Payer: Self-pay | Admitting: Family Medicine

## 2010-09-17 VITALS — BP 136/80 | HR 72 | Temp 97.3°F | Ht 64.0 in | Wt 219.0 lb

## 2010-09-17 DIAGNOSIS — F411 Generalized anxiety disorder: Secondary | ICD-10-CM

## 2010-09-17 MED ORDER — PHENTERMINE HCL 15 MG PO CAPS: 15.0000 mg | ORAL_CAPSULE | ORAL | Status: AC

## 2010-09-17 MED ORDER — BUSPIRONE HCL 30 MG PO TABS: 30.0000 mg | ORAL_TABLET | Freq: Every evening | ORAL | Status: DC

## 2010-09-17 MED ORDER — ALPRAZOLAM 0.5 MG PO TBDP: 0.5000 mg | ORAL_TABLET | Freq: Three times a day (TID) | ORAL | Status: AC | PRN

## 2010-09-17 MED ORDER — ALPRAZOLAM 0.25 MG PO TABS: 0.5000 mg | ORAL_TABLET | Freq: Three times a day (TID) | ORAL | Status: DC | PRN

## 2010-09-17 NOTE — Progress Notes (Signed)
45 yo here to follow up anxiety and weight management.  Anxiety- Has had issues with anxiety since she was a teenager. When her mom died in 09/15/01, was on multiple medications, including Paxil, Wellbutrin, Prozac.  All made her feel worse.  Gained 30 pounds on Prozac, Paxil made her feel more anxious.  Added Busprione 15 mg last year which was helping but past 3 weeks feels like her anxiety has worsened. They have adopted two more children- 69 month old boy and girl twins.   She does not feel like she is short tempered with them, just feels herself getting anxious. No SI or HI.   Obesity- has tried to cut back on her portions.  Knows that her downfall is regular sodas, she is cutting back.  Tried phenteramine last year.  Phenteramine did not give her palpitations or CP. Wt Readings from Last 3 Encounters:  09/17/10 219 lb (99.338 kg)  03/12/10 210 lb (95.255 kg)  01/14/10 215 lb (97.523 kg)    The PMH, PSH, Social History, Family History, Medications, and allergies have been reviewed in [facility], and have been updated if relevant.   Review of Systems       See HPI Psych:  Denies mental problems, panic attacks, sense of great danger, suicidal thoughts/plans, thoughts of violence, unusual visions or sounds, and thoughts /plans of harming others.  Physical Exam BP 136/80  Pulse 72  Temp(Src) 97.3 F (36.3 C) (Oral)  Ht 5\' 4"  (1.626 m)  Wt 219 lb (99.338 kg)  BMI 37.59 kg/m2  LMP 09/16/2010 General:  alert, well-developed, well-nourished, well-hydrated, and overweight-appearing.  Lost 12 pounds! Psych:  talkative, pleasant, normal affect, moderately anxious

## 2010-09-17 NOTE — Assessment & Plan Note (Signed)
Deteriorated. >25 min spent with face to face with patient counseling and coordinating care. Increase buspar to 30 mg nightly, increase dose of Xanax.

## 2010-09-17 NOTE — Assessment & Plan Note (Signed)
Deteriorated. Will restart ph enteramine  today. Follow up in one month.

## 2010-10-11 ENCOUNTER — Emergency Department (HOSPITAL_COMMUNITY)
Admission: EM | Admit: 2010-10-11 | Discharge: 2010-10-11 | Disposition: A | Discharge status: Home / Self Care | Payer: Medicare HMO | Attending: Emergency Medicine | Admitting: Emergency Medicine

## 2010-10-11 DIAGNOSIS — W268XXA Contact with other sharp object(s), not elsewhere classified, initial encounter: Secondary | ICD-10-CM | POA: Insufficient documentation

## 2010-10-11 DIAGNOSIS — S60459A Superficial foreign body of unspecified finger, initial encounter: Secondary | ICD-10-CM | POA: Insufficient documentation

## 2010-11-04 ENCOUNTER — Telehealth: Payer: Self-pay | Admitting: *Deleted

## 2010-11-04 NOTE — Telephone Encounter (Signed)
Patient called to let Dr. Dayton Martes know that she will be getting a call from Child Protective Services.  Patient stated that the nursing home that her mother in law is in filed the complaint.  She stated that the nursing home is accusing her of not taking her medications and said that she is bipolar.  Advised patient that I would let Dr. Dayton Martes know.

## 2010-11-04 NOTE — Telephone Encounter (Signed)
Ok but I cannot get involved in this situation as I cannot know what happens in her home or what type of parent she is based on our interactions in the office.

## 2010-11-29 ENCOUNTER — Other Ambulatory Visit: Payer: Self-pay | Admitting: *Deleted

## 2010-11-29 MED ORDER — ALPRAZOLAM 0.5 MG PO TABS: ORAL_TABLET | ORAL | Status: DC

## 2010-11-29 NOTE — Telephone Encounter (Signed)
Rx called to pharmacy

## 2011-02-15 ENCOUNTER — Emergency Department (HOSPITAL_COMMUNITY)
Admission: EM | Admit: 2011-02-15 | Discharge: 2011-02-15 | Disposition: A | Discharge status: Home / Self Care | Payer: Medicare HMO | Attending: Emergency Medicine | Admitting: Emergency Medicine

## 2011-02-15 ENCOUNTER — Encounter (HOSPITAL_COMMUNITY): Payer: Self-pay | Admitting: *Deleted

## 2011-02-15 ENCOUNTER — Emergency Department (HOSPITAL_COMMUNITY): Payer: Medicare HMO

## 2011-02-15 DIAGNOSIS — IMO0002 Reserved for concepts with insufficient information to code with codable children: Secondary | ICD-10-CM | POA: Insufficient documentation

## 2011-02-15 DIAGNOSIS — S93401A Sprain of unspecified ligament of right ankle, initial encounter: Secondary | ICD-10-CM

## 2011-02-15 DIAGNOSIS — S80211A Abrasion, right knee, initial encounter: Secondary | ICD-10-CM

## 2011-02-15 DIAGNOSIS — K219 Gastro-esophageal reflux disease without esophagitis: Secondary | ICD-10-CM | POA: Insufficient documentation

## 2011-02-15 DIAGNOSIS — F411 Generalized anxiety disorder: Secondary | ICD-10-CM | POA: Insufficient documentation

## 2011-02-15 DIAGNOSIS — R296 Repeated falls: Secondary | ICD-10-CM | POA: Insufficient documentation

## 2011-02-15 DIAGNOSIS — I10 Essential (primary) hypertension: Secondary | ICD-10-CM | POA: Insufficient documentation

## 2011-02-15 DIAGNOSIS — S93409A Sprain of unspecified ligament of unspecified ankle, initial encounter: Secondary | ICD-10-CM | POA: Insufficient documentation

## 2011-02-15 DIAGNOSIS — E785 Hyperlipidemia, unspecified: Secondary | ICD-10-CM | POA: Insufficient documentation

## 2011-02-15 DIAGNOSIS — M25569 Pain in unspecified knee: Secondary | ICD-10-CM | POA: Insufficient documentation

## 2011-02-15 NOTE — ED Notes (Signed)
Patient fell of a sidewalk forward, landed on knees, abrasion noted to right knee, c/o pain to right knee down to foot

## 2011-02-15 NOTE — ED Notes (Signed)
Abrasion noted to right knee, c/o swelling to right knee as well

## 2011-02-15 NOTE — ED Provider Notes (Signed)
History     CSN: [id number] Arrival date & time: 02/15/2011  1:37 PM   First MD Initiated Contact with Patient 02/15/11 1352      Chief Complaint  Patient presents with  . Fall  . Knee Pain    (Consider location/radiation/quality/duration/timing/severity/associated sxs/prior treatment) HPI Comments: Walking and didn't see curbing.  She stepped off inverting R ankle and falling onto R knee.  Patient is a 45 y.o. female presenting with fall and knee pain. The history is provided by the patient. No language interpreter was used.  Fall The accident occurred 1 to 2 hours ago. The fall occurred while walking. Distance fallen: from standing. She landed on concrete. The pain is present in the right knee (R ankle.). The pain is at a severity of 6/10. She was ambulatory at the scene. There was no entrapment after the fall. There was no drug use involved in the accident. There was no alcohol use involved in the accident.  Knee Pain Associated symptoms include joint swelling.    Past Medical History  Diagnosis Date  . Anxiety   . GERD (gastroesophageal reflux disease)   . Hyperlipidemia   . Hypertension     Past Surgical History  Procedure Date  . Septoplasty 1988  . Tonsillectomy 1991  . Cholecystectomy     1998  . Back surgery 2004    l4/5 fusion    Family History  Problem Relation Age of Onset  . COPD Mother   . Hypertension Mother   . GER disease Mother   . Heart attack Father   . Cancer Father     prostate  . Hyperlipidemia Brother   . Hypertension Brother   . Heart attack Paternal Grandmother   . Heart attack Paternal Grandfather   . Stroke Paternal Grandfather   . Hyperlipidemia Brother   . Hypertension Brother     History  Substance Use Topics  . Smoking status: Never Smoker   . Smokeless tobacco: Not on file  . Alcohol Use: No    OB History    Grav Para Term Preterm Abortions TAB SAB Ect Mult Living                  Review of Systems    Musculoskeletal: Positive for joint swelling.  Skin:       Knee abrasion  All other systems reviewed and are negative.    Allergies  Penicillins; Cimetidine; Conjugated estrogens; Cyclobenzaprine hcl; Dicyclomine hcl; Lisinopril; Paroxetine; Rosuvastatin; Sulfa antibiotics; Wellbutrin; Ciprofloxacin; Doxycycline hyclate; and Erythromycin base  Home Medications   Current Outpatient Rx  Name Route Sig Dispense Refill  . ACETAMINOPHEN 500 MG PO TABS Oral Take 1,000 mg by mouth every 6 (six) hours as needed. For pain     . ALPRAZOLAM 0.5 MG PO TABS Oral Take 0.5 mg by mouth 3 (three) times daily as needed. for anxiety     . BUSPIRONE HCL 30 MG PO TABS Oral Take 30 mg by mouth at bedtime.      . IBUPROFEN 200 MG PO TABS Oral Take 800 mg by mouth every 6 (six) hours as needed. For cramps     . ACETAMINOPHEN 325 MG PO TABS Oral Take 650 mg by mouth every 6 (six) hours as needed.        BP 139/78  Pulse 78  Temp(Src) 98 F (36.7 C) (Oral)  Resp 20  Ht 5\' 5"  (1.651 m)  Wt 212 lb (96.163 kg)  BMI 35.28 kg/m2  SpO2  100%  LMP 02/08/2011  Physical Exam  Nursing note and vitals reviewed. Constitutional: She is oriented to person, place, and time. She appears well-developed and well-nourished. No distress.  HENT:  Head: Normocephalic and atraumatic.  Eyes: EOM are normal.  Neck: Normal range of motion.  Cardiovascular: Normal rate, regular rhythm and normal heart sounds.   Pulmonary/Chest: Effort normal and breath sounds normal.  Abdominal: Soft. She exhibits no distension. There is no tenderness.  Musculoskeletal: Normal range of motion.       Legs: Neurological: She is alert and oriented to person, place, and time.  Skin: Skin is warm and dry.  Psychiatric: She has a normal mood and affect. Judgment normal.    ED Course  Procedures (including critical care time)  Labs Reviewed - No data to display Dg Tibia/fibula Right  02/15/2011  *RADIOLOGY REPORT*  Clinical Data: Fall  the  RIGHT TIBIA AND FIBULA - 2 VIEW  Comparison: None.  Findings: No evidence of fracture of the right tibia or fibula. The knee joint and ankle joint appear normal.  IMPRESSION: No fracture.  Original Report Authenticated By: Genevive Bi, M.D.   Dg Knee Complete 4 Views Right  02/15/2011  *RADIOLOGY REPORT*  Clinical Data: Knee and lower leg pain  RIGHT KNEE - COMPLETE 4+ VIEW  Comparison: None available  Findings: No evidence of fracture or dislocation of the right knee. No joint effusion.  No radiodense foreign body.  IMPRESSION: No fracture or effusion.  Original Report Authenticated By: Genevive Bi, M.D.     No diagnosis found.    MDM          Worthy Rancher, PA 02/15/11 1524

## 2011-02-15 NOTE — ED Provider Notes (Signed)
Medical screening examination/treatment/procedure(s) were performed by non-physician practitioner and as supervising physician I was immediately available for consultation/collaboration.   Shelda Jakes, MD 02/15/11 [phone]

## 2011-02-18 ENCOUNTER — Other Ambulatory Visit: Payer: Self-pay | Admitting: *Deleted

## 2011-02-18 MED ORDER — ALPRAZOLAM 0.5 MG PO TABS: ORAL_TABLET | ORAL | Status: DC

## 2011-02-18 NOTE — Telephone Encounter (Signed)
Rx called to pharmacy

## 2011-02-18 NOTE — Telephone Encounter (Signed)
Last refill 11/29/2010. 

## 2011-04-21 ENCOUNTER — Other Ambulatory Visit: Payer: Self-pay | Admitting: *Deleted

## 2011-04-21 MED ORDER — ALPRAZOLAM 0.5 MG PO TABS: ORAL_TABLET | ORAL | Status: DC

## 2011-04-21 NOTE — Telephone Encounter (Signed)
Rx called to [facility]

## 2011-05-26 ENCOUNTER — Ambulatory Visit: Payer: Medicare HMO | Admitting: Family Medicine

## 2011-06-21 ENCOUNTER — Other Ambulatory Visit: Payer: Self-pay | Admitting: *Deleted

## 2011-06-21 MED ORDER — ALPRAZOLAM 0.5 MG PO TABS: ORAL_TABLET | ORAL | Status: DC

## 2011-06-21 NOTE — Telephone Encounter (Signed)
Medicine called to pharmacy. 

## 2011-08-22 ENCOUNTER — Encounter: Payer: Self-pay | Admitting: Family Medicine

## 2011-08-22 ENCOUNTER — Ambulatory Visit (INDEPENDENT_AMBULATORY_CARE_PROVIDER_SITE_OTHER): Payer: Medicare HMO | Admitting: Family Medicine

## 2011-08-22 VITALS — BP 152/90 | HR 72 | Temp 97.6°F | Wt 210.0 lb

## 2011-08-22 DIAGNOSIS — M25562 Pain in left knee: Secondary | ICD-10-CM | POA: Insufficient documentation

## 2011-08-22 DIAGNOSIS — M25569 Pain in unspecified knee: Secondary | ICD-10-CM

## 2011-08-22 MED ORDER — DICLOFENAC SODIUM 1 % TD GEL: TRANSDERMAL | Status: DC

## 2011-08-22 NOTE — Patient Instructions (Signed)
Take tylenol 500mg  1-2 tabs three times a day for pain. Aleve 1-2 tabs twice a day with food Glucosamine sulfate 750mg  twice a day is a supplement that has been shown to help moderate to severe arthritis. Voltaren topically up to four times a day may also help with pain. Cortisone injections are an option. If cortisone injections do not help, there are different types of shots that may help but they take longer to take effect. It's important that you continue to stay active.  Consider physical therapy to strengthen muscles around the joint that hurts to take pressure off of the joint itself. Congratulations on your weight loss- keep working on it.  Water aerobics and cycling with low resistance are the best two types of exercise for arthritis. A knee brace may be helpful if your knee feels unstable due to pain.

## 2011-08-22 NOTE — Progress Notes (Signed)
SUBJECTIVE: Cassandra Mcconnell is a 46 y.o. female who sustained a left knee injury in October 2012- fell in parking lot.  Went to [facility]- xrays neg.  Since then, has intermittent pain- worse at end of day.  Mild swelling.   H/o DJD of spine. Strong FH of arthritis per pt. Tylenol does help.  Patient Active Problem List  Diagnoses  . HYPERCHOLESTEROLEMIA, PURE  . MORBID OBESITY  . ANXIETY /PANIC ATTACKS  . MULTIPLE CRANIAL NERVE PALSIES  . HYPERTENSION, BENIGN ESSENTIAL  . ACUTE BRONCHITIS  . GERD  . HYPERTROPHY, BREAST  . PAIN IN THORACIC SPINE  . BACK PAIN, ACUTE  . FACIAL RASH  . EDEMA- LOCALIZED  . HEADACHE  . CONTUSION, LEFT FOOT  . CHOLECYSTECTOMY, HX OF  . Knee pain, left   Past Medical History  Diagnosis Date  . Anxiety   . GERD (gastroesophageal reflux disease)   . Hyperlipidemia   . Hypertension    Past Surgical History  Procedure Date  . Septoplasty 1988  . Tonsillectomy 1991  . Cholecystectomy     1998  . Back surgery 2004    l4/5 fusion   History  Substance Use Topics  . Smoking status: Never Smoker   . Smokeless tobacco: Not on file  . Alcohol Use: No   Family History  Problem Relation Age of Onset  . COPD Mother   . Hypertension Mother   . GER disease Mother   . Heart attack Father   . Cancer Father     prostate  . Hyperlipidemia Brother   . Hypertension Brother   . Heart attack Paternal Grandmother   . Heart attack Paternal Grandfather   . Stroke Paternal Grandfather   . Hyperlipidemia Brother   . Hypertension Brother    Allergies  Allergen Reactions  . Penicillins Shortness Of Breath  . Cimetidine Other (See Comments)    unknown  . Conjugated Estrogens Other (See Comments)    emotional  . Cyclobenzaprine Hcl     REACTION: disorientation  . Dicyclomine Hcl Other (See Comments)    unknown  . Lisinopril     REACTION: RASH AND MOUTH SORE  . Paroxetine   . Rosuvastatin Dermatitis  . Sulfa Antibiotics Hives and Itching  .  Wellbutrin (Bupropion Hcl) Other (See Comments)    Emotional, makes depression worse  . Ciprofloxacin Itching and Rash  . Doxycycline Hyclate Itching and Rash  . Erythromycin Base Itching and Rash   Current Outpatient Prescriptions on File Prior to Visit  Medication Sig Dispense Refill  . acetaminophen (TYLENOL) 325 MG tablet Take 650 mg by mouth every 6 (six) hours as needed.        Marland Kitchen acetaminophen (TYLENOL) 500 MG tablet Take 1,000 mg by mouth every 6 (six) hours as needed. For pain       . ALPRAZolam (XANAX) 0.5 MG tablet Take one tablet by mouth three times daily as needed for anxiety  90 tablet  0  . busPIRone (BUSPAR) 30 MG tablet Take 30 mg by mouth at bedtime.        Marland Kitchen ibuprofen (ADVIL,MOTRIN) 200 MG tablet Take 800 mg by mouth every 6 (six) hours as needed. For cramps        The PMH, PSH, Social History, Family History, Medications, and allergies have been reviewed in [facility], and have been updated if relevant.  OBJECTIVE: BP 152/90  Pulse 72  Temp(Src) 97.6 F (36.4 C) (Oral)  Wt 210 lb (95.255 kg)  Vital  signs as noted above. Appearance: alert, well appearing, and in no distress. Knee exam: normal exam, no swelling, tenderness, instability; ligaments intact, FROM. X-ray: not indicated.  ASSESSMENT: Knee underlying chronic DJD likely  PLAN: Supportive care- see pt instructions for details. Rx for Voltaren gel sent to pharmacy.

## 2011-08-24 ENCOUNTER — Other Ambulatory Visit: Payer: Self-pay | Admitting: *Deleted

## 2011-08-24 NOTE — Telephone Encounter (Signed)
Faxed refill request from [facility].  Last filled on 06/21/11.

## 2011-08-25 MED ORDER — ALPRAZOLAM 0.5 MG PO TABS: ORAL_TABLET | ORAL | Status: DC

## 2011-08-25 NOTE — Telephone Encounter (Signed)
Medicine called to pharmacy. 

## 2011-09-27 ENCOUNTER — Other Ambulatory Visit: Payer: Self-pay | Admitting: Family Medicine

## 2011-11-16 ENCOUNTER — Ambulatory Visit (INDEPENDENT_AMBULATORY_CARE_PROVIDER_SITE_OTHER): Payer: Medicare HMO | Admitting: Family Medicine

## 2011-11-16 ENCOUNTER — Encounter: Payer: Self-pay | Admitting: Family Medicine

## 2011-11-16 VITALS — BP 122/80 | HR 76 | Temp 97.9°F | Wt 207.0 lb

## 2011-11-16 DIAGNOSIS — F411 Generalized anxiety disorder: Secondary | ICD-10-CM

## 2011-11-16 MED ORDER — BUSPIRONE HCL 30 MG PO TABS: 30.0000 mg | ORAL_TABLET | Freq: Two times a day (BID) | ORAL | Status: AC

## 2011-11-16 NOTE — Progress Notes (Signed)
46 yo here for follow up anxiety.    Anxiety- Has had issues with anxiety since she was a teenager. When her mom died in September 28, 2001, was on multiple medications, including Paxil, Wellbutrin, Prozac.  All made her feel worse.  Gained 30 pounds on Prozac, Paxil made her feel more anxious.  On Buspar 30 mg daily, feels this is just not working. They have adopted two more children- 53 month old boy and girl twins.   She does not feel like she is short tempered with them, just feels herself getting anxious. No SI or HI.   Patient Active Problem List  Diagnosis  . HYPERCHOLESTEROLEMIA, PURE  . MORBID OBESITY  . ANXIETY /PANIC ATTACKS  . MULTIPLE CRANIAL NERVE PALSIES  . HYPERTENSION, BENIGN ESSENTIAL  . ACUTE BRONCHITIS  . GERD  . HYPERTROPHY, BREAST  . PAIN IN THORACIC SPINE  . BACK PAIN, ACUTE  . FACIAL RASH  . EDEMA- LOCALIZED  . HEADACHE  . CONTUSION, LEFT FOOT  . CHOLECYSTECTOMY, HX OF  . Knee pain, left   Past Medical History  Diagnosis Date  . Anxiety   . GERD (gastroesophageal reflux disease)   . Hyperlipidemia   . Hypertension    Past Surgical History  Procedure Date  . Septoplasty 1988  . Tonsillectomy 1991  . Cholecystectomy     1998  . Back surgery 2002/09/29    l4/5 fusion   History  Substance Use Topics  . Smoking status: Never Smoker   . Smokeless tobacco: Not on file  . Alcohol Use: No   Family History  Problem Relation Age of Onset  . COPD Mother   . Hypertension Mother   . GER disease Mother   . Heart attack Father   . Cancer Father     prostate  . Hyperlipidemia Brother   . Hypertension Brother   . Heart attack Paternal Grandmother   . Heart attack Paternal Grandfather   . Stroke Paternal Grandfather   . Hyperlipidemia Brother   . Hypertension Brother    Allergies  Allergen Reactions  . Penicillins Shortness Of Breath  . Cimetidine Other (See Comments)    unknown  . Conjugated Estrogens Other (See Comments)    emotional  . Cyclobenzaprine Hcl       REACTION: disorientation  . Dicyclomine Hcl Other (See Comments)    unknown  . Lisinopril     REACTION: RASH AND MOUTH SORE  . Paroxetine   . Rosuvastatin Dermatitis  . Sulfa Antibiotics Hives and Itching  . Wellbutrin (Bupropion Hcl) Other (See Comments)    Emotional, makes depression worse  . Ciprofloxacin Itching and Rash  . Doxycycline Hyclate Itching and Rash  . Erythromycin Base Itching and Rash   Current Outpatient Prescriptions on File Prior to Visit  Medication Sig Dispense Refill  . acetaminophen (TYLENOL) 325 MG tablet Take 650 mg by mouth every 6 (six) hours as needed.        Marland Kitchen acetaminophen (TYLENOL) 500 MG tablet Take 1,000 mg by mouth every 6 (six) hours as needed. For pain       . ALPRAZolam (XANAX) 0.5 MG tablet Take one tablet by mouth three times daily as needed for anxiety  90 tablet  1  . BUSPAR 30 MG tablet TAKE 1 TABLET BY MOUTH EVERY NIGHT.  30 each  3  . diclofenac sodium (VOLTAREN) 1 % GEL Apply 4 grams topically up to 4 times daily.  100 g  1  . ibuprofen (ADVIL,MOTRIN) 200 MG  tablet Take 800 mg by mouth every 6 (six) hours as needed. For cramps          The PMH, PSH, Social History, Family History, Medications, and allergies have been reviewed in [facility], and have been updated if relevant.   Review of Systems       See HPI Psych:  Denies mental problems, panic attacks, sense of great danger, suicidal thoughts/plans, thoughts of violence, unusual visions or sounds, and thoughts /plans of harming others.  Physical Exam BP 122/80  Pulse 76  Temp 97.9 F (36.6 C)  Wt 207 lb (93.895 kg) General:  alert, well-developed, well-nourished, well-hydrated, and overweight-appearing.  Lost 12 pounds! Psych:  talkative, pleasant, normal affect, moderately anxious   Assessment and Plan: 1. ANXIETY /PANIC ATTACKS    Deteriorated. >15 min spent with face to face with patient, >50% counseling and/or coordinating care. Increase Buspar to 30 mg twice  daily. Follow up as needed. The patient indicates understanding of these issues and agrees with the plan.

## 2011-12-15 ENCOUNTER — Other Ambulatory Visit: Payer: Self-pay

## 2011-12-15 MED ORDER — ALPRAZOLAM 0.5 MG PO TABS: ORAL_TABLET | ORAL | Status: DC

## 2011-12-15 NOTE — Telephone Encounter (Signed)
Pt out of Alprazolam; request 15 day supply called to [place].Please advise.

## 2011-12-15 NOTE — Telephone Encounter (Signed)
Spoke with Cassandra Mcconnell at [facility]; he has already filled # [phone] for pt today; pt had a refill already available. I d/c this refill since pt already had refill available and filled at pharmacy.

## 2011-12-27 ENCOUNTER — Telehealth: Payer: Self-pay

## 2011-12-27 NOTE — Telephone Encounter (Signed)
Noted  

## 2011-12-27 NOTE — Telephone Encounter (Signed)
Started on 12/26/11; Left eye swollen,red, itching,draining clear liquid, left side of face feels slightly swollen, with sinus h/a and sneezing but no fever.[facility]. Pt scheduled appt 12/28/11 at 4 pm. Pt cannot come to office in AM.

## 2011-12-28 ENCOUNTER — Encounter: Payer: Self-pay | Admitting: Family Medicine

## 2011-12-28 ENCOUNTER — Ambulatory Visit (INDEPENDENT_AMBULATORY_CARE_PROVIDER_SITE_OTHER): Payer: Medicare HMO | Admitting: Family Medicine

## 2011-12-28 VITALS — BP 130/84 | HR 72 | Temp 98.5°F

## 2011-12-28 DIAGNOSIS — H109 Unspecified conjunctivitis: Secondary | ICD-10-CM

## 2011-12-28 MED ORDER — AZITHROMYCIN 1 % OP SOLN: 1.0000 [drp] | Freq: Two times a day (BID) | OPHTHALMIC | Status: DC

## 2011-12-28 NOTE — Assessment & Plan Note (Signed)
Start zmax eye drops.  Has tolerated oral zmax prev.  D/w pt about hand washing, f/u prn.  She agrees.

## 2011-12-28 NOTE — Progress Notes (Signed)
L eye was red and irritated.  Noted yesterday. Since then the lower lid got puffy.  Not painful but feels irritated.  No vision change.  HA noted recently and L sided upper and lower tooth pain.  No FCNAVD.  No one else is sick.  No cough.    Meds, vitals, and allergies reviewed.   ROS: See HPI.  Otherwise, noncontributory.  nad ncat TMs wnl Nasal and OP exam wnl, max sinuses not ttp but L TMJ ttp (recently had been eating a tough steak per report) Neck supple PERRL EOMI Fundus wnl x2 L conjunctiva injected with lower lid edema.  No FB seen, no abrasion seen on staining and exam with wood's lamp R conjunctiva wnl

## 2011-12-28 NOTE — Patient Instructions (Addendum)
Use the eyedrop and wash your hands frequently.  Take care.

## 2011-12-29 ENCOUNTER — Telehealth: Payer: Self-pay | Admitting: *Deleted

## 2011-12-29 NOTE — Telephone Encounter (Signed)
Agreed she should STOP eye drops- please add to allergy list. We would not give her abx for sinus infection if symptoms have not persisted for more than 7 days (as most a viral and abx don't help). She has multiple antibiotic allergies and we would like to use them for her only when she needs them.  Also Drink lots of fluids.  Treat sympotmatically with Mucinex, nasal saline irrigation, and Tylenol/Ibuprofen. Also try claritin D, Allegra D or zyrtec D over the counter- two times a day as needed ( have to sign for them at pharmacy). You can use warm compresses.  Call if not improving as expected in 5-7 days.

## 2011-12-29 NOTE — Telephone Encounter (Signed)
Spoke with patient and advised results, she will call if this doesn't work, pt says to tell Dr. Dayton Martes thank you.

## 2011-12-29 NOTE — Telephone Encounter (Signed)
Patient calling VERY upset that she seen Dr.Duncan yesterday and he diagnosed her with pinkeye and per pt she doesn't have pink eye, pt thinks she have a sinus infection. Pt was rx'd some eye drops AZITHROMYCIN and per pt her left side of her face is swollen up and her eye is almost swollen shut, I advised pt not to use the eye drops again. Pt states after she used the eye drops her eye burned about 30 minutes, pt states she's had a headache for 3 days and more sinus pressure, she's taking allegra. Please advise if appt is needed

## 2012-01-02 ENCOUNTER — Telehealth: Payer: Self-pay | Admitting: Family Medicine

## 2012-01-02 NOTE — Telephone Encounter (Signed)
Pt states she is much better.  Numbness in her face is gone.  She appreciates our calling her back.

## 2012-01-02 NOTE — Telephone Encounter (Signed)
Please call to check on pt. 

## 2012-01-02 NOTE — Telephone Encounter (Signed)
Triage Record Num: [id number] Operator: Tomasita Crumble Patient Name: Nisha Dhami Call Date & Time: 12/31/2011 12:01:19PM Patient Phone: [phone] PCP: Ruthe Mannan Patient Gender: Female PCP Fax : [phone] Patient DOB: 1966/03/09 [facility] Name[facility] Reason for Call: Caller: Linlee/Patient; PCP: Ruthe Mannan (Family Practice); CB#: [phone]; Call regarding Headache, numbness in left side of face; States she was being treated for sinus infection, stopped Z-Pack due to face/ eye swelling and eye burning; this has been evaluated. Reports normal movement in face. Emergent sx ruled out. Home care for the interim and follow up with provider in 72 hours or sooner if needed per Numbness or Tingling protocol. Protocol(s) Used: Numbness or Tingling Recommended Outcome per Protocol: See Provider within 72 Hours Reason for Outcome: New or worsening change in sensation (paresthesias) in extremities AND no other symptoms Care Advice: ~ DO NOT drive until condition evaluated. ~ Protect hands and feet from the cold. Wear multiple thin layers to increase protection from the cold. Call provider immediately if numbness or tingling suddenly worsen or cause inability to perform activities of daily living. ~ ~ CAUTIONS ~ List, or take, all current prescription(s), nonprescription or alternative medication(s) to provider for evaluation. 12/31/2011 12:10:31PM Page 1 of 1 [email or web address]

## 2012-01-02 NOTE — Telephone Encounter (Signed)
Left message asking pt to call back. 

## 2012-03-08 ENCOUNTER — Ambulatory Visit (INDEPENDENT_AMBULATORY_CARE_PROVIDER_SITE_OTHER): Payer: Medicare HMO | Admitting: Psychology

## 2012-03-08 DIAGNOSIS — F39 Unspecified mood [affective] disorder: Secondary | ICD-10-CM

## 2012-03-08 DIAGNOSIS — F419 Anxiety disorder, unspecified: Secondary | ICD-10-CM

## 2012-03-08 DIAGNOSIS — F411 Generalized anxiety disorder: Secondary | ICD-10-CM

## 2012-03-19 ENCOUNTER — Ambulatory Visit (INDEPENDENT_AMBULATORY_CARE_PROVIDER_SITE_OTHER): Payer: Medicare HMO | Admitting: Psychology

## 2012-03-19 ENCOUNTER — Telehealth: Payer: Self-pay | Admitting: Family Medicine

## 2012-03-19 DIAGNOSIS — F411 Generalized anxiety disorder: Secondary | ICD-10-CM

## 2012-03-19 DIAGNOSIS — F39 Unspecified mood [affective] disorder: Secondary | ICD-10-CM

## 2012-03-19 DIAGNOSIS — F419 Anxiety disorder, unspecified: Secondary | ICD-10-CM

## 2012-03-19 NOTE — Telephone Encounter (Signed)
[address] [place], [place] [address] p. [phone] f. [phone] To: [facility] (After Hours Triage) Fax: [phone] From: [facility] Date/ Time: 03/17/2012 7:58 AM Taken By: Vivi Barrack, RN Caller: Misty Stanley Facility: Not Collected Patient: Cassandra Mcconnell, Cassandra Mcconnell DOB: 12/08/1965 Phone: [phone] Reason for Call: Caller was unable to be reached on callback - Left Message Regarding Appointment: No Appt Date: Appt Time: Unknown Provider: Reason: Details: Outcome:

## 2012-03-19 NOTE — Telephone Encounter (Signed)
Spoke with patient, she says she feels better now.  She didn't hear anything back this week end from the on call doctor, but she says that's ok.  Does feel better today.  Says she got angry with the [facility] staff because they were asking her so many questions and she didn't feel well enough to answer them.

## 2012-03-19 NOTE — Telephone Encounter (Signed)
Please call to check on pt. 

## 2012-03-19 NOTE — Telephone Encounter (Signed)
[facility] Triage Call Report Triage Record Num: [id number] Operator: Valene Bors Patient Name: Cassandra Mcconnell Call Date & Time: 03/17/2012 2:23:02PM Patient Phone: [phone] PCP: Ruthe Mannan Patient Gender: Female PCP Fax : [phone] Patient DOB: April 19, 1966 [facility] Name[facility] Reason for Call: LMP: 11/15/13aller: Cassandra Mcconnell/Patient; PCP: Ruthe Mannan (Family Practice); CB#: [phone]; Call regarding Vomiting 10 x and diarrhea 20 x in past 24 hours. Onset of sx on 03/14/12. She started with fever this morning temp = 102.3 temporal at 1410. She was having bouts of dizziness yesterday but better today. Her stomach is hurting. She hung up during the triage - left message for her to call back if needed. Protocol(s) Used: PCP Calls, No Triage (Adult) Recommended Outcome per Protocol: Provide Information or Advice Only Reason for Outcome: [1] Other nonurgent information for PCP AND [2] does not require PCP response Care Advice: ~ 03/17/2012 3:13:49PM Page 1 of 1 [email or web address]_[email or web address]

## 2012-03-28 ENCOUNTER — Encounter (HOSPITAL_COMMUNITY): Payer: Self-pay | Admitting: Psychology

## 2012-03-28 NOTE — Progress Notes (Signed)
Patient:  Cassandra Mcconnell   DOB: [birth year]  MR Number: [id number]  Location: BEHAVIORAL [facility] [address] [place] [address] Dept: [phone]  Start: 3 PM End: 4 PM  Provider/Observer:     Hershal Coria PSYD  Chief Complaint:      Chief Complaint  Patient presents with  . Anxiety  . Stress  . Depression    Reason For Service:     The patient returns to our office for psychotherapeutic and counseling efforts. I saw her for some time several years ago. The patient reports that she needs therapy to help deal with some emotional trauma and stress/years concerning a child that she has been caring for the foster home situation with severe emotional issues and safety issues. She reports that this 47-year-old son has put her family in jeopardy due to this lies to talk protective services and has been harming his sister. These difficulties have been progressing for nearly 2 years but they have really escalated and the past few months resulting in the patient having to give up custody of this child. The patient has adopted twins age 103 years old and also has a year-old and a 23-year-old. He 44-year-old son has been in placement since he was 16 months old and the patient had been working towards adopting him. However, this is very short more and more problems and began putting more stress on the patient has been making some significant accusations that are unfounded. The son has an IQ of 12 with significant expressive language delays and intermittent explosive disorder. He likely suffers from fetal alcohol syndrome and his mental retardation.  Interventions Strategy:   cognitive/behavioral psychotherapeutic interventions  Participation Level:   Active  Participation Quality:  Appropriate      Behavioral Observation:  Well Groomed, Alert, and Appropriate.   Current Psychosocial Factors: The patient has been  under a great deal of stress primarily as a result of her 24-year-old son who lives in an placement and has alcohol fetal syndrome. She adopted him or was in the process of adopting him but has been unable to manage very well.  Content of Session:   Reviewed current symptoms and continue to work on therapeutic interventions.  Current Status:   The patient reports significant issues of excessive worrying and anxiety recently.  Patient Progress:   Stable  Target Goals:   Target goals include reducing the intensity, frequency, and duration of anxiety and mood issues.  Last Reviewed:   03/08/2012  Goals Addressed Today:    We address goals of mood stability and better coping skills.  Impression/Diagnosis:   The patient has a history of anxiety as well as a mood disorder. However, she had been doing very well for quite some time been major stressors in her life that caused him increased anxiety and worry.  Diagnosis:    Axis I:  1. Mood disorder   2. Anxiety         Axis II: No diagnosis

## 2012-04-02 ENCOUNTER — Ambulatory Visit (INDEPENDENT_AMBULATORY_CARE_PROVIDER_SITE_OTHER): Payer: Medicare HMO | Admitting: Psychology

## 2012-04-02 ENCOUNTER — Encounter (HOSPITAL_COMMUNITY): Payer: Self-pay | Admitting: Psychology

## 2012-04-02 DIAGNOSIS — F419 Anxiety disorder, unspecified: Secondary | ICD-10-CM

## 2012-04-02 DIAGNOSIS — F411 Generalized anxiety disorder: Secondary | ICD-10-CM

## 2012-04-02 DIAGNOSIS — F39 Unspecified mood [affective] disorder: Secondary | ICD-10-CM

## 2012-04-02 NOTE — Progress Notes (Signed)
Patient:  Cassandra Mcconnell   DOB: May 08, 1965  MR Number: [id number]  Location: BEHAVIORAL [facility] [address] [place] [address] Dept: [phone]  Start: 3 PM End: 4 PM  Provider/Observer:     Hershal Coria PSYD  Chief Complaint:      Chief Complaint  Patient presents with  . Stress  . Anxiety    Reason For Service:     The patient returns to our office for psychotherapeutic and counseling efforts. I saw her for some time several years ago. The patient reports that she needs therapy to help deal with some emotional trauma and stress/years concerning a child that she has been caring for the foster home situation with severe emotional issues and safety issues. She reports that this 103-year-old son has put her family in jeopardy due to this lies to talk protective services and has been harming his sister. These difficulties have been progressing for nearly 2 years but they have really escalated and the past few months resulting in the patient having to give up custody of this child. The patient has adopted twins age 96 years old and also has a year-old and a 63-year-old. He 70-year-old son has been in placement since he was 18 months old and the patient had been working towards adopting him. However, this is very short more and more problems and began putting more stress on the patient has been making some significant accusations that are unfounded. The son has an IQ of 39 with significant expressive language delays and intermittent explosive disorder. He likely suffers from fetal alcohol syndrome and his mental retardation.  Interventions Strategy:   cognitive/behavioral psychotherapeutic interventions  Participation Level:   Active  Participation Quality:  Appropriate      Behavioral Observation:  Well Groomed, Alert, and Appropriate.   Current Psychosocial Factors: The patient reports that she still very  stressed about issues with her adopted son Cassandra Mcconnell. He has been in a foster home and has been getting therapeutic care but continues his behaviors where he may explains that are not backup I facts and has a history of hurting his sister. The patient is quite concerned that once the 9 month of therapeutic interventions are done returns that it would be a danger to her and to the   Content  sister of Cassandra Mcconnell. of Session:   Reviewed current symptoms and continue to work on therapeutic interventions.  Current Status:   The patient reports that her worry and anxiety has been improved over the past week or so and that she has been coping better.  Patient Progress:   Stable  Target Goals:   Target goals include reducing the intensity, frequency, and duration of anxiety and mood issues.  Last Reviewed:   03/08/2012  Goals Addressed Today:    We address goals of mood stability and better coping skills.  Impression/Diagnosis:   The patient has a history of anxiety as well as a mood disorder. However, she had been doing very well for quite some time been major stressors in her life that caused him increased anxiety and worry.  Diagnosis:    Axis I:  1. Mood disorder   2. Anxiety         Axis II: No diagnosis

## 2012-04-03 ENCOUNTER — Encounter (HOSPITAL_COMMUNITY): Payer: Self-pay | Admitting: Psychology

## 2012-04-03 NOTE — Progress Notes (Signed)
Patient:  Cassandra Mcconnell   DOB: [birth year]  MR Number: [id number]  Location: BEHAVIORAL [facility] [address] [place] [address] Dept: [phone]  Start: 9 AM End: 10 AM  Provider/Observer:     Hershal Coria PSYD  Chief Complaint:      Chief Complaint  Patient presents with  . Anxiety  . Agitation  . Stress    Reason For Service: The patient returns to our office for psychotherapeutic and counseling efforts. I saw her for some time several years ago. The patient reports that she needs therapy to help deal with some emotional trauma and stress/years concerning a child that she has been caring for the foster home situation with severe emotional issues and safety issues. She reports that this 47-year-old son has put her family in jeopardy due to this lies to talk protective services and has been harming his sister. These difficulties have been progressing for nearly 2 years but they have really escalated and the past few months resulting in the patient having to give up custody of this child. The patient has adopted twins age 1 years old and also has a year-old and a 65-year-old. He 63-year-old son has been in placement since he was 54 months old and the patient had been working towards adopting him. However, this is very short more and more problems and began putting more stress on the patient has been making some significant accusations that are unfounded. The son has an IQ of 69 with significant expressive language delays and intermittent explosive disorder. He likely suffers from fetal alcohol syndrome and his mental retardation.  Interventions Strategy: cognitive/behavioral psychotherapeutic interventions  Participation Level: Active  Participation Quality: Appropriate  Behavioral Observation: Well Groomed, Alert, and Appropriate.  Current Psychosocial Factors: The patient reports that she still very  stressed about issues with her adopted son [name]. He has been in a foster home and has been getting therapeutic care but continues his behaviors where he may explains that are not backup I facts and has a history of hurting his sister. The patient is quite concerned that once the 9 month of therapeutic interventions are done returns that it would be a danger to her and to the  Content sister of [name]. of Session: Reviewed current symptoms and continue to work on therapeutic interventions.  Current Status: The patient reports that her worry and anxiety has been improved over the past week or so and that she has been coping better.  Patient Progress: Stable  Target Goals: Target goals include reducing the intensity, frequency, and duration of anxiety and mood issues.  Last Reviewed: 03/08/2012  Goals Addressed Today: We address goals of mood stability and better coping skills.  Impression/Diagnosis: The patient has a history of anxiety as well as a mood disorder. However, she had been doing very well for quite some time been major stressors in her life that caused him increased anxiety and worry.     Diagnosis:    Axis I:  1. Mood disorder   2. Anxiety         Axis II: No diagnosis

## 2012-04-11 ENCOUNTER — Ambulatory Visit (INDEPENDENT_AMBULATORY_CARE_PROVIDER_SITE_OTHER): Payer: Medicare HMO | Admitting: Family Medicine

## 2012-04-11 ENCOUNTER — Encounter: Payer: Self-pay | Admitting: Family Medicine

## 2012-04-11 VITALS — BP 132/90 | HR 80 | Temp 97.8°F | Ht 65.0 in | Wt 217.0 lb

## 2012-04-11 DIAGNOSIS — R51 Headache: Secondary | ICD-10-CM

## 2012-04-11 NOTE — Progress Notes (Signed)
46 yo here for HA.  Past two weeks, bilateral frontal HA.  Not associated with nausea, vomiting or photophobia.  No changes in gait or other focal neurological issues. No dizziness although schedulers listed dizziness as complaint.  Had her glasses prescription changed just prior to these symptoms starting.  She wanted to make sure her BP was not elevated.  No CP or SOB.     Patient Active Problem List  Diagnosis  . HYPERCHOLESTEROLEMIA, PURE  . MORBID OBESITY  . ANXIETY /PANIC ATTACKS  . MULTIPLE CRANIAL NERVE PALSIES  . HYPERTENSION, BENIGN ESSENTIAL  . ACUTE BRONCHITIS  . GERD  . HYPERTROPHY, BREAST  . PAIN IN THORACIC SPINE  . BACK PAIN, ACUTE  . FACIAL RASH  . EDEMA- LOCALIZED  . HEADACHE  . CONTUSION, LEFT FOOT  . CHOLECYSTECTOMY, HX OF  . Knee pain, left  . Conjunctivitis   Past Medical History  Diagnosis Date  . Anxiety   . GERD (gastroesophageal reflux disease)   . Hyperlipidemia   . Hypertension    Past Surgical History  Procedure Date  . Septoplasty 1988  . Tonsillectomy 1991  . Cholecystectomy     1998  . Back surgery 2004    l4/5 fusion   History  Substance Use Topics  . Smoking status: Never Smoker   . Smokeless tobacco: Never Used  . Alcohol Use: No   Family History  Problem Relation Age of Onset  . COPD Mother   . Hypertension Mother   . GER disease Mother   . Heart attack Father   . Cancer Father     prostate  . Hyperlipidemia Brother   . Hypertension Brother   . Heart attack Paternal Grandmother   . Heart attack Paternal Grandfather   . Stroke Paternal Grandfather   . Hyperlipidemia Brother   . Hypertension Brother    Allergies  Allergen Reactions  . Penicillins Shortness Of Breath  . Azithromycin Swelling  . Cimetidine Other (See Comments)    unknown  . Conjugated Estrogens Other (See Comments)    emotional  . Cyclobenzaprine Hcl     REACTION: disorientation  . Dicyclomine Hcl Other (See Comments)    unknown  .  Lisinopril     REACTION: RASH AND MOUTH SORE  . Paroxetine   . Rosuvastatin Dermatitis  . Sulfa Antibiotics Hives and Itching  . Wellbutrin (Bupropion Hcl) Other (See Comments)    Emotional, makes depression worse  . Ciprofloxacin Itching and Rash  . Doxycycline Hyclate Itching and Rash  . Erythromycin Base Itching and Rash   Current Outpatient Prescriptions on File Prior to Visit  Medication Sig Dispense Refill  . acetaminophen (TYLENOL) 500 MG tablet Take 1,000 mg by mouth every 6 (six) hours as needed. For pain       . ALPRAZolam (XANAX) 0.5 MG tablet Take one tablet by mouth three times daily as needed for anxiety  45 tablet  0  . busPIRone (BUSPAR) 30 MG tablet Take 1 tablet (30 mg total) by mouth 2 (two) times daily.  60 tablet  11  . diclofenac sodium (VOLTAREN) 1 % GEL Apply 4 grams topically up to 4 times daily as needed      . Fexofenadine HCl (ALLEGRA PO) Take by mouth daily as needed.      Marland Kitchen ibuprofen (ADVIL,MOTRIN) 200 MG tablet Take 800 mg by mouth every 6 (six) hours as needed. For cramps       . Multiple Vitamin (MULTIVITAMIN) tablet Take 1 tablet  by mouth daily.      Jeananne Rama Sulfate (ALLERGY RELIEF EYE DROPS OP) Apply to eye 4 (four) times daily as needed.         The PMH, PSH, Social History, Family History, Medications, and allergies have been reviewed in [facility], and have been updated if relevant.   Review of Systems       See HPI Psych:  Denies mental problems, panic attacks, sense of great danger, suicidal thoughts/plans, thoughts of violence, unusual visions or sounds, and thoughts /plans of harming others.  Physical Exam BP 132/90  Pulse 80  Temp 97.8 F (36.6 C)  Ht 5\' 5"  (1.651 m)  Wt 217 lb (98.431 kg)  BMI 36.11 kg/m2  General:  Well-developed,well-nourished,in no acute distress; alert,appropriate and cooperative throughout examination Head:  normocephalic and atraumatic.   Eyes:  vision grossly intact, pupils equal, pupils round, and  pupils reactive to light.   Ears:  R ear normal and L ear normal.   Nose:  no external deformity.   Mouth:  good dentition.   Neck:  No deformities, masses, or tenderness noted. Breasts:  No mass, nodules, thickening, tenderness, bulging, retraction, inflamation, nipple discharge or skin changes noted.   Lungs:  Normal respiratory effort, chest expands symmetrically. Lungs are clear to auscultation, no crackles or wheezes. Heart:  Normal rate and regular rhythm. S1 and S2 normal without gallop, murmur, click, rub or other extra sounds. Extremities:  No clubbing, cyanosis, edema, or deformity noted with normal full range of motion of all joints.   Neurologic:  alert & oriented X3 and gait normal.   Skin:  Intact without suspicious lesions or rashes Psych:  Cognition and judgment appear intact. Alert and cooperative with normal attention span and concentration. No apparent delusions, illusions, hallucinations    Assessment and Plan: 1.  HA-  BP looks good.  Seems most consistent with tension HA vs prescription eye glass change associated HA. Red flag symptoms discussed.  Exam reassuring today.  No change in meds today. The patient indicates understanding of these issues and agrees with the plan.

## 2012-04-30 ENCOUNTER — Ambulatory Visit (INDEPENDENT_AMBULATORY_CARE_PROVIDER_SITE_OTHER): Payer: Self-pay | Admitting: Psychology

## 2012-04-30 ENCOUNTER — Encounter (HOSPITAL_COMMUNITY): Payer: Self-pay | Admitting: Psychology

## 2012-04-30 DIAGNOSIS — F419 Anxiety disorder, unspecified: Secondary | ICD-10-CM

## 2012-04-30 DIAGNOSIS — F39 Unspecified mood [affective] disorder: Secondary | ICD-10-CM

## 2012-04-30 DIAGNOSIS — F411 Generalized anxiety disorder: Secondary | ICD-10-CM

## 2012-04-30 NOTE — Progress Notes (Signed)
Patient:  Cassandra Mcconnell   DOB: 09/14/1965  MR Number: [id number]  Location: BEHAVIORAL [facility] [address] [place] [address] Dept: [phone]  Start: 2 PM End: 3 PM  Provider/Observer:     Hershal Coria PSYD  Chief Complaint:      Chief Complaint  Patient presents with  . Stress  . Trauma  . Anxiety    Reason For Service:     The patient returns to our office for psychotherapeutic and counseling efforts. I saw her for some time several years ago. The patient reports that she needs therapy to help deal with some emotional trauma and stress/years concerning a child that she has been caring for the foster home situation with severe emotional issues and safety issues. She reports that this 48-year-old son has put her family in jeopardy due to this lies to talk protective services and has been harming his sister. These difficulties have been progressing for nearly 2 years but they have really escalated and the past few months resulting in the patient having to give up custody of this child. The patient has adopted twins age 50 years old and also has a year-old and a 20-year-old. He 76-year-old son has been in placement since he was 62 months old and the patient had been working towards adopting him. However, this is very short more and more problems and began putting more stress on the patient has been making some significant accusations that are unfounded. The son has an IQ of 65 with significant expressive language delays and intermittent explosive disorder. He likely suffers from fetal alcohol syndrome and his mental retardation.  Interventions Strategy:   cognitive/behavioral psychotherapeutic interventions  Participation Level:   Active  Participation Quality:  Appropriate      Behavioral Observation:  Well Groomed, Alert, and Appropriate.   Current Psychosocial Factors: The patient comes in today  and reports that she is doing much better. She reports that she has continued to have some stressors with regard to the agencies overseeing her foster son now that he is in a therapeutic foster home. She reports that there've been times where she felt like they have been treating her life she had done something wrong when in fact the young child has a lot of psychological and neurological problems. She is handling coping the better.  Content Session:   Reviewed current symptoms and continue to work on therapeutic interventions.  Current Status:   The patient reports that her worry and anxiety has been improved over the past week or so and that she has been coping better.  Patient Progress:   Stable  Target Goals:   Target goals include reducing the intensity, frequency, and duration of anxiety and mood issues.  Last Reviewed:   04/30/2012  Goals Addressed Today:    We address goals of mood stability and better coping skills.  Impression/Diagnosis:   The patient has a history of anxiety as well as a mood disorder. However, she had been doing very well for quite some time been major stressors in her life that caused him increased anxiety and worry.  Diagnosis:    Axis I:  1. Mood disorder   2. Anxiety         Axis II: No diagnosis

## 2012-05-21 ENCOUNTER — Ambulatory Visit (HOSPITAL_COMMUNITY): Payer: Self-pay | Admitting: Psychology

## 2012-07-12 ENCOUNTER — Other Ambulatory Visit: Payer: Self-pay | Admitting: Family Medicine

## 2012-07-12 MED ORDER — ALPRAZOLAM 0.5 MG PO TABS: ORAL_TABLET | ORAL | Status: DC

## 2012-07-12 NOTE — Telephone Encounter (Signed)
[facility] faxed refill request for Alprazolam.  Last filled 12/15/2011 according to our records.

## 2012-07-12 NOTE — Telephone Encounter (Signed)
Medication phoned to [employer] [facility] with [place]) as instructed. Pt also called to ck on status of refill and advised called in. Pt will ck with pharmacist.

## 2012-07-27 ENCOUNTER — Encounter (HOSPITAL_COMMUNITY): Payer: Self-pay | Admitting: Psychology

## 2012-07-27 ENCOUNTER — Ambulatory Visit (INDEPENDENT_AMBULATORY_CARE_PROVIDER_SITE_OTHER): Payer: Medicare HMO | Admitting: Psychology

## 2012-07-27 DIAGNOSIS — F419 Anxiety disorder, unspecified: Secondary | ICD-10-CM

## 2012-07-27 DIAGNOSIS — F39 Unspecified mood [affective] disorder: Secondary | ICD-10-CM

## 2012-07-27 DIAGNOSIS — F411 Generalized anxiety disorder: Secondary | ICD-10-CM

## 2012-07-27 NOTE — Progress Notes (Signed)
Patient:  Cassandra Mcconnell   DOB: May 25, 1965  MR Number: [id number]  Location: BEHAVIORAL [facility] [address] [place] [address] Dept: [phone]  Start: 10 AM End: 11 AM  Provider/Observer:     Hershal Coria PSYD  Chief Complaint:      Chief Complaint  Patient presents with  . Anxiety  . Stress    Reason For Service:     The patient returns to our office for psychotherapeutic and counseling efforts. I saw her for some time several years ago. The patient reports that she needs therapy to help deal with some emotional trauma and stress/years concerning a child that she has been caring for the foster home situation with severe emotional issues and safety issues. She reports that this 27-year-old son has put her family in jeopardy due to this lies to talk protective services and has been harming his sister. These difficulties have been progressing for nearly 2 years but they have really escalated and the past few months resulting in the patient having to give up custody of this child. The patient has adopted twins age 46 years old and also has a year-old and a 49-year-old. He 54-year-old son has been in placement since he was 82 months old and the patient had been working towards adopting him. However, this is very short more and more problems and began putting more stress on the patient has been making some significant accusations that are unfounded. The son has an IQ of 45 with significant expressive language delays and intermittent explosive disorder. He likely suffers from fetal alcohol syndrome and his mental retardation.  Interventions Strategy:   cognitive/behavioral psychotherapeutic interventions  Participation Level:   Active  Participation Quality:  Appropriate      Behavioral Observation:  Well Groomed, Alert, and Appropriate.   Current Psychosocial Factors: The patient reports that she is in the  process of trying to finalize the adoption of her twins that she has been fostering. However, because she had some his difficulty with her first foster child Casimiro Needle that they have not put some roadblocks does adoption. However, there was really no choice she had given the numerous psychological difficulties that Casimiro Needle was having and that he really needed to be in a residential home. The patient reports that other than that she has been doing very well. The patient reports that her other foster child has been in a residential facility in his actually doing quite well. She said that he will be reported return this fall. The patient reports that her foster daughter has continued to do very well and she may be on a release.  Content Session:   Reviewed current symptoms and continue to work on therapeutic interventions.  Current Status:   The patient reports that her worry and anxiety has been improved over the past week or so and that she has been coping better.  Patient Progress:   Stable  Target Goals:   Target goals include reducing the intensity, frequency, and duration of anxiety and mood issues.  Last Reviewed:   07/27/2012  Goals Addressed Today:    We address goals of mood stability and better coping skills.  Impression/Diagnosis:   The patient has a history of anxiety as well as a mood disorder. However, she had been doing very well for quite some time been major stressors in her life that caused him increased anxiety and worry.  Diagnosis:    Axis I:  Mood disorder  Anxiety      Axis II: No diagnosis

## 2012-08-15 ENCOUNTER — Telehealth (HOSPITAL_COMMUNITY): Payer: Self-pay | Admitting: Psychology

## 2012-08-16 ENCOUNTER — Encounter (HOSPITAL_COMMUNITY): Payer: Self-pay | Admitting: Psychology

## 2012-08-23 ENCOUNTER — Ambulatory Visit: Payer: Self-pay | Admitting: Family Medicine

## 2012-08-23 ENCOUNTER — Other Ambulatory Visit: Payer: Self-pay | Admitting: *Deleted

## 2012-08-23 MED ORDER — ALPRAZOLAM 0.5 MG PO TABS: ORAL_TABLET | ORAL | Status: DC

## 2012-08-23 NOTE — Telephone Encounter (Signed)
Last filled 06/2012     

## 2012-08-23 NOTE — Telephone Encounter (Signed)
Medicine called to pharmacy. 

## 2012-08-24 ENCOUNTER — Encounter (HOSPITAL_COMMUNITY): Payer: Self-pay | Admitting: Psychology

## 2012-08-24 ENCOUNTER — Ambulatory Visit (INDEPENDENT_AMBULATORY_CARE_PROVIDER_SITE_OTHER): Payer: Medicare HMO | Admitting: Psychology

## 2012-08-24 DIAGNOSIS — F39 Unspecified mood [affective] disorder: Secondary | ICD-10-CM

## 2012-08-24 DIAGNOSIS — F419 Anxiety disorder, unspecified: Secondary | ICD-10-CM

## 2012-08-24 DIAGNOSIS — F411 Generalized anxiety disorder: Secondary | ICD-10-CM

## 2012-08-24 NOTE — Progress Notes (Signed)
Patient:  Cassandra Mcconnell   DOB: 10/08/1965  MR Number: [id number]  Location: BEHAVIORAL [facility] [address] [place] [address] Dept: [phone]  Start: 9 AM End: 10 AM  Provider/Observer:     Hershal Coria PSYD  Chief Complaint:      Chief Complaint  Patient presents with  . Anxiety  . Stress  . Agitation    Reason For Service:     The patient returns to our office for psychotherapeutic and counseling efforts. I saw her for some time several years ago. The patient reports that she needs therapy to help deal with some emotional trauma and stress/years concerning a child that she has been caring for the foster home situation with severe emotional issues and safety issues. She reports that this 48-year-old son has put her family in jeopardy due to this lies to talk protective services and has been harming his sister. These difficulties have been progressing for nearly 2 years but they have really escalated and the past few months resulting in the patient having to give up custody of this child. The patient has adopted twins age 14 years old and also has a year-old and a 68-year-old. He 48-year-old son has been in placement since he was 85 months old and the patient had been working towards adopting him. However, this is very short more and more problems and began putting more stress on the patient has been making some significant accusations that are unfounded. The son has an IQ of 76 with significant expressive language delays and intermittent explosive disorder. He likely suffers from fetal alcohol syndrome and his mental retardation.  Interventions Strategy:   cognitive/behavioral psychotherapeutic interventions  Participation Level:   Active  Participation Quality:  Appropriate      Behavioral Observation:  Well Groomed, Alert, and Appropriate.   Current Psychosocial Factors: The patient reports that  her mother-in-law passed away after significant deterioration in health functioning. Essentially she died from congestive heart failure. The patient reports that much of the practical planning and execution of what needed to be done after her mother-in-law passed away L. in her shoulders but she has been able to handle this well. The patient reports that there have been troubles with the adoption of the 2 very young children that she was in the process of adopting. They're not been much documentation at all when she got the children about a previous attempted adoption with Cassandra Mcconnell who had numerous psychological and psychiatric illness and ultimately had to be placed in a residential facility. She is working on correcting this issue.  Content Session:   Reviewed current symptoms and continue to work on therapeutic interventions.  Current Status:   The patient reports that while there've been significant stressors she has been coping quite well given her history.  Patient Progress:   Stable  Target Goals:   Target goals include reducing the intensity, frequency, and duration of anxiety and mood issues.  Last Reviewed:   08/24/2012  Goals Addressed Today:    We address goals of mood stability and better coping skills.  Impression/Diagnosis:   The patient has a history of anxiety as well as a mood disorder. However, she had been doing very well for quite some time been major stressors in her life that caused him increased anxiety and worry.  Diagnosis:    Axis I:  Mood disorder  Anxiety      Axis II: No diagnosis

## 2012-09-14 ENCOUNTER — Ambulatory Visit (INDEPENDENT_AMBULATORY_CARE_PROVIDER_SITE_OTHER): Payer: Medicare HMO | Admitting: Psychology

## 2012-09-14 ENCOUNTER — Encounter (HOSPITAL_COMMUNITY): Payer: Self-pay | Admitting: Psychology

## 2012-09-14 DIAGNOSIS — F419 Anxiety disorder, unspecified: Secondary | ICD-10-CM

## 2012-09-14 DIAGNOSIS — F39 Unspecified mood [affective] disorder: Secondary | ICD-10-CM

## 2012-09-14 NOTE — Progress Notes (Signed)
Patient:  Cassandra Mcconnell   DOB: 09-Apr-1966  MR Number: [id number]  Location: BEHAVIORAL [facility]- [address] [place] [address] Dept: [phone]  Start: 10 AM End: 11 AM  Provider/Observer:     Hershal Coria PSYD  Chief Complaint:      Chief Complaint  Patient presents with  . Anxiety  . Stress  . Agitation  . Depression    Reason For Service:     The patient returns to our office for psychotherapeutic and counseling efforts. I saw her for some time several years ago. The patient reports that she needs therapy to help deal with some emotional trauma and stress/years concerning a child that she has been caring for the foster home situation with severe emotional issues and safety issues. She reports that this 18-year-old son has put her family in jeopardy due to this lies to talk protective services and has been harming his sister. These difficulties have been progressing for nearly 2 years but they have really escalated and the past few months resulting in the patient having to give up custody of this child. The patient has adopted twins age 67 years old and also has a year-old and a 60-year-old. He 17-year-old son has been in placement since he was 73 months old and the patient had been working towards adopting him. However, this is very short more and more problems and began putting more stress on the patient has been making some significant accusations that are unfounded. The son has an IQ of 21 with significant expressive language delays and intermittent explosive disorder. He likely suffers from fetal alcohol syndrome and his mental retardation.  Interventions Strategy:   cognitive/behavioral psychotherapeutic interventions  Participation Level:   Active  Participation Quality:  Appropriate      Behavioral Observation:  Well Groomed, Alert, and Appropriate.   Current Psychosocial Factors: The  patient reports that the state is still giving her a very hard time regarding her having to terminate parental rights with her son down. This is pertaining to her adoption of the 2 young children she was working on. The patient is very upset about this but has gotten to the point where she is resigned to the fact that what will happen in this case will happen and that really nothing more that she can do. She has gotten a letter from me as well as affidavits from other adoption agencies regarding the situation the fact that she was actually quite honest and truthful with them as the information became clear.  Content Session:   Reviewed current symptoms and continue to work on therapeutic interventions.  Current Status:   The patient reports that she has been more stressed and depressed lately because of what is happening with regard to her adopted children have not been taken away from her.  Patient Progress:   Stable  Target Goals:   Target goals include reducing the intensity, frequency, and duration of anxiety and mood issues.  Last Reviewed:   09/14/2012  Goals Addressed Today:    We address goals of mood stability and better coping skills.  Impression/Diagnosis:   The patient has a history of anxiety as well as a mood disorder. However, she had been doing very well for quite some time been major stressors in her life that caused him increased anxiety and worry.  Diagnosis:    Axis I:  Mood disorder  Anxiety      Axis  II: No diagnosis

## 2012-09-26 ENCOUNTER — Other Ambulatory Visit: Payer: Self-pay | Admitting: *Deleted

## 2012-09-26 MED ORDER — ALPRAZOLAM 0.5 MG PO TABS: ORAL_TABLET | ORAL | Status: DC

## 2012-09-27 NOTE — Telephone Encounter (Signed)
Pt calling on status of xanax refill; advised pt Scott at [facility] said rx ready for pickup. Pt said last 2 months her xanax refills have been messed up ( pt said pharmacy has to put in more than one request for med refill).  Was going to advise pt if she wanted to call me directly for Xanax refill next month rather than contacting pharmacy I would put refill request in for pt; unfortunately pt hung up while I was talking. [employer] production planner notified.

## 2012-09-27 NOTE — Telephone Encounter (Signed)
rx called to pharmacy 

## 2012-10-03 ENCOUNTER — Telehealth: Payer: Self-pay

## 2012-10-03 NOTE — Telephone Encounter (Signed)
Spoke with patient.  Appointment scheduled with Dr. Para March for tomorrow.

## 2012-10-03 NOTE — Telephone Encounter (Signed)
Pt said BP has been elevated for 6 months; pt still having h/a; saw eye dr and that is not the cause for h/a.Pt said has hx in pts family of heart disease and high blood pressure. First of this week BP 155/105; rechecked 153/103. This morning BP 150/100 early AM. 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 Pt very concerned about BP and will come in for appt if Dr Dayton Martes will start pt on medication; pt said if cannot start on BP med will have to change PCP.Please advise.

## 2012-10-03 NOTE — Telephone Encounter (Signed)
Noted  

## 2012-10-04 ENCOUNTER — Ambulatory Visit (INDEPENDENT_AMBULATORY_CARE_PROVIDER_SITE_OTHER): Payer: Medicare HMO | Admitting: Family Medicine

## 2012-10-04 ENCOUNTER — Encounter: Payer: Self-pay | Admitting: Family Medicine

## 2012-10-04 ENCOUNTER — Encounter: Payer: Self-pay | Admitting: *Deleted

## 2012-10-04 VITALS — BP 150/86 | HR 87 | Temp 98.2°F | Wt 226.5 lb

## 2012-10-04 DIAGNOSIS — I10 Essential (primary) hypertension: Secondary | ICD-10-CM

## 2012-10-04 MED ORDER — METOPROLOL SUCCINATE ER 25 MG PO TB24: 25.0000 mg | ORAL_TABLET | Freq: Every day | ORAL | Status: DC

## 2012-10-04 NOTE — Progress Notes (Signed)
Her home situation has been altered recently, as a foster parent.  I asked her not to discuss this in front of her children here at the [facility].  Her BP was up in the meantime.  This isn't a new issue for her, was up over the last year.  She didn't tolerate ACE and propranolol prev.  The long term plan for anxiety involves counseling and f/u with her psychiatry re: meds.  When she gets anxious, she'll get tightness in her chest, SOB, feels weak diffusely.  No BLE edema unless prolonged time on her feet.  She is trying to cut out caffeine.  Diet d/w pt.  She is working on this.  She admits to stress eating.  Meds, vitals, and allergies reviewed.   ROS: See HPI.  Otherwise, noncontributory.  GEN: nad, alert and oriented HEENT: mucous membranes moist NECK: supple w/o LA CV: rrr.  PULM: ctab, no inc wob ABD: soft, +bs EXT: no edema SKIN: no acute rash

## 2012-10-04 NOTE — Patient Instructions (Addendum)
Go to the lab on the way out.  We'll contact you with your lab report.  Take 1 metoprolol once a day for about 10 days.  If BP <130/<90, then continue with that.  If still high, then take 2 a day.  Take care.  Call back as needed.

## 2012-10-05 ENCOUNTER — Encounter: Payer: Self-pay | Admitting: *Deleted

## 2012-10-05 LAB — BASIC METABOLIC PANEL
Calcium: 9.4 mg/dL (ref 8.4–10.5)
GFR: 88.97 mL/min (ref >60.00)
Glucose, Bld: 86 mg/dL (ref 70–99)
Sodium: 141 mEq/L (ref 135–145)

## 2012-10-05 NOTE — Assessment & Plan Note (Signed)
She may be able to tolerate metoprolol.  With her h/o anxiety, BB may be useful.  Start 25mg  a day, inc to 50 if tolerated and BP up.  F/u with PCP prn.  She agrees.

## 2012-10-19 ENCOUNTER — Ambulatory Visit (INDEPENDENT_AMBULATORY_CARE_PROVIDER_SITE_OTHER): Payer: Medicare HMO | Admitting: Psychology

## 2012-10-19 ENCOUNTER — Encounter (HOSPITAL_COMMUNITY): Payer: Self-pay | Admitting: Psychology

## 2012-10-19 DIAGNOSIS — F419 Anxiety disorder, unspecified: Secondary | ICD-10-CM

## 2012-10-19 DIAGNOSIS — F39 Unspecified mood [affective] disorder: Secondary | ICD-10-CM

## 2012-10-19 DIAGNOSIS — F411 Generalized anxiety disorder: Secondary | ICD-10-CM
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
## 2012-10-29 ENCOUNTER — Other Ambulatory Visit: Payer: Self-pay | Admitting: *Deleted

## 2012-10-29 MED ORDER — ALPRAZOLAM 0.5 MG PO TABS: ORAL_TABLET | ORAL | Status: DC

## 2012-10-29 NOTE — Telephone Encounter (Signed)
Refill called to apothecary.

## 2012-10-29 NOTE — Telephone Encounter (Signed)
Last filled 09/27/12

## 2012-11-29 ENCOUNTER — Emergency Department (HOSPITAL_COMMUNITY)
Admission: EM | Admit: 2012-11-29 | Discharge: 2012-11-29 | Disposition: A | Discharge status: Home / Self Care | Payer: Medicare HMO | Attending: Emergency Medicine | Admitting: Emergency Medicine

## 2012-11-29 ENCOUNTER — Encounter (HOSPITAL_COMMUNITY): Payer: Self-pay

## 2012-11-29 ENCOUNTER — Emergency Department (HOSPITAL_COMMUNITY): Payer: Medicare HMO

## 2012-11-29 DIAGNOSIS — Z862 Personal history of diseases of the blood and blood-forming organs and certain disorders involving the immune mechanism: Secondary | ICD-10-CM | POA: Insufficient documentation

## 2012-11-29 DIAGNOSIS — Z8639 Personal history of other endocrine, nutritional and metabolic disease: Secondary | ICD-10-CM | POA: Insufficient documentation

## 2012-11-29 DIAGNOSIS — Z79899 Other long term (current) drug therapy: Secondary | ICD-10-CM | POA: Insufficient documentation

## 2012-11-29 DIAGNOSIS — Y929 Unspecified place or not applicable: Secondary | ICD-10-CM | POA: Insufficient documentation

## 2012-11-29 DIAGNOSIS — S63601A Unspecified sprain of right thumb, initial encounter: Secondary | ICD-10-CM

## 2012-11-29 DIAGNOSIS — Y9301 Activity, walking, marching and hiking: Secondary | ICD-10-CM | POA: Insufficient documentation

## 2012-11-29 DIAGNOSIS — Z8719 Personal history of other diseases of the digestive system: Secondary | ICD-10-CM | POA: Insufficient documentation

## 2012-11-29 DIAGNOSIS — F411 Generalized anxiety disorder: Secondary | ICD-10-CM | POA: Insufficient documentation

## 2012-11-29 DIAGNOSIS — I10 Essential (primary) hypertension: Secondary | ICD-10-CM | POA: Insufficient documentation

## 2012-11-29 DIAGNOSIS — Z880 Allergy status to penicillin: Secondary | ICD-10-CM | POA: Insufficient documentation

## 2012-11-29 DIAGNOSIS — S6390XA Sprain of unspecified part of unspecified wrist and hand, initial encounter: Secondary | ICD-10-CM | POA: Insufficient documentation

## 2012-11-29 DIAGNOSIS — W010XXA Fall on same level from slipping, tripping and stumbling without subsequent striking against object, initial encounter: Secondary | ICD-10-CM | POA: Insufficient documentation

## 2012-11-29 NOTE — ED Notes (Signed)
Pt states she tripped and fell into her freezer

## 2012-12-02 NOTE — ED Provider Notes (Signed)
CSN: [id number]     Arrival date & time 11/29/12  1802 History     First MD Initiated Contact with Patient 11/29/12 1809     Chief Complaint  Patient presents with  . Hand Injury   (Consider location/radiation/quality/duration/timing/severity/associated sxs/prior Treatment) Patient is a 48 y.o. female presenting with hand injury. The history is provided by the patient.  Hand Injury Location:  Finger Time since incident:  1 day Injury: yes   Mechanism of injury: fall   Mechanism of injury comment:  Direct blow to the right thumb Fall:    Fall occurred:  Tripped   Point of impact:  Outstretched arms   Entrapped after fall: no   Finger location:  R thumb Pain details:    Quality:  Aching and throbbing   Radiates to:  Does not radiate   Severity:  Moderate   Onset quality:  Sudden   Timing:  Constant   Progression:  Unchanged Chronicity:  New Dislocation: no   Foreign body present:  No foreign bodies Prior injury to area:  No Relieved by:  Nothing Worsened by:  Movement Ineffective treatments:  Ice Associated symptoms: swelling   Associated symptoms: no back pain, no decreased range of motion, no fatigue, no fever, no muscle weakness, no neck pain, no numbness, no stiffness and no tingling     Past Medical History  Diagnosis Date  . Anxiety   . GERD (gastroesophageal reflux disease)   . Hyperlipidemia   . Hypertension    Past Surgical History  Procedure Laterality Date  . Septoplasty  1988  . Tonsillectomy  1991  . Cholecystectomy      1998  . Back surgery  2004    l4/5 fusion   Family History  Problem Relation Age of Onset  . COPD Mother   . Hypertension Mother   . GER disease Mother   . Heart attack Father   . Cancer Father     prostate  . Hyperlipidemia Brother   . Hypertension Brother   . Heart attack Paternal Grandmother   . Heart attack Paternal Grandfather   . Stroke Paternal Grandfather   . Hyperlipidemia Brother   . Hypertension Brother     History  Substance Use Topics  . Smoking status: Never Smoker   . Smokeless tobacco: Never Used  . Alcohol Use: No   OB History   Grav Para Term Preterm Abortions TAB SAB Ect Mult Living                 Review of Systems  Constitutional: Negative for fever, chills and fatigue.  HENT: Negative for neck pain.   Genitourinary: Negative for dysuria and difficulty urinating.  Musculoskeletal: Positive for joint swelling and arthralgias. Negative for back pain and stiffness.  Skin: Negative for color change and wound.  All other systems reviewed and are negative.    Allergies  Penicillins; Azithromycin; Cimetidine; Conjugated estrogens; Cyclobenzaprine hcl; Dicyclomine hcl; Lisinopril; Paroxetine; Rosuvastatin; Sulfa antibiotics; Wellbutrin; Ciprofloxacin; Doxycycline hyclate; and Erythromycin base  Home Medications   Current Outpatient Rx  Name  Route  Sig  Dispense  Refill  . acetaminophen (TYLENOL) 500 MG tablet   Oral   Take 1,000 mg by mouth every 6 (six) hours as needed. For pain          . ALPRAZolam (XANAX) 0.5 MG tablet      Take one tablet by mouth three times daily as needed for anxiety   45 tablet   0   .  diclofenac sodium (VOLTAREN) 1 % GEL      Apply 4 grams topically up to 4 times daily as needed         . fexofenadine-pseudoephedrine (ALLEGRA-D 24) 180-240 MG per 24 hr tablet   Oral   Take 1 tablet by mouth daily.         Marland Kitchen ibuprofen (ADVIL,MOTRIN) 200 MG tablet   Oral   Take 800 mg by mouth every 6 (six) hours as needed. For cramps          . metoprolol succinate (TOPROL-XL) 25 MG 24 hr tablet   Oral   Take 1-2 tablets (25-50 mg total) by mouth daily.   60 tablet   3    BP 149/91  Pulse 77  Temp(Src) 98.3 F (36.8 C) (Oral)  Resp 20  Ht 5\' 5"  (1.651 m)  Wt 230 lb (104.327 kg)  BMI 38.27 kg/m2  SpO2 98%  LMP 11/19/2012 Physical Exam  Nursing note and vitals reviewed. Constitutional: She is oriented to person, place, and time.  She appears well-developed and well-nourished. No distress.  HENT:  Head: Normocephalic and atraumatic.  Cardiovascular: Normal rate, regular rhythm and normal heart sounds.   Pulmonary/Chest: Effort normal and breath sounds normal. No respiratory distress.  Musculoskeletal: She exhibits tenderness. She exhibits no edema.       Right hand: She exhibits tenderness, bony tenderness and swelling. She exhibits normal range of motion, normal two-point discrimination, normal capillary refill, no deformity and no laceration. Normal sensation noted. Normal strength noted.       Hands: ttp of the proximal joint of right thumb and into thenar eminence.   Radial pulse is brisk, sensation intact.  CR< 2 sec.  No bruising or bony deformity.  Patient has full ROM.  Neurological: She is alert and oriented to person, place, and time. She exhibits normal muscle tone. Coordination normal.  Skin: Skin is warm and dry.    ED Course   Procedures (including critical care time)  Labs Reviewed - No data to display No results found.  Dg Finger Thumb Right  11/29/2012   *RADIOLOGY REPORT*  Clinical Data: Injury, pain  RIGHT THUMB 2+V  Comparison: None.  Findings: Normal alignment without displaced fracture.  Preserved joint spaces.  No significant arthropathy.  No soft tissue abnormality.  IMPRESSION: No acute osseous finding   Original Report Authenticated By: Judie Petit. Shick, M.D.   1. Thumb sprain, right, initial encounter     MDM    Thumb spica applied, pain improved, remains NV intact.  Pt agrees to elevate, ice and close f/u with ortho.    Velmer Woelfel L. Trisha Mangle, PA-C 12/02/12 1314

## 2012-12-03 ENCOUNTER — Encounter (HOSPITAL_COMMUNITY): Payer: Self-pay | Admitting: Emergency Medicine

## 2012-12-03 ENCOUNTER — Emergency Department (HOSPITAL_COMMUNITY): Payer: No Typology Code available for payment source

## 2012-12-03 ENCOUNTER — Emergency Department (HOSPITAL_COMMUNITY)
Admission: EM | Admit: 2012-12-03 | Discharge: 2012-12-03 | Disposition: A | Discharge status: Home / Self Care | Payer: No Typology Code available for payment source | Attending: Emergency Medicine | Admitting: Emergency Medicine

## 2012-12-03 DIAGNOSIS — S7010XA Contusion of unspecified thigh, initial encounter: Secondary | ICD-10-CM | POA: Insufficient documentation

## 2012-12-03 DIAGNOSIS — Z8639 Personal history of other endocrine, nutritional and metabolic disease: Secondary | ICD-10-CM | POA: Insufficient documentation

## 2012-12-03 DIAGNOSIS — Z880 Allergy status to penicillin: Secondary | ICD-10-CM | POA: Insufficient documentation

## 2012-12-03 DIAGNOSIS — Z862 Personal history of diseases of the blood and blood-forming organs and certain disorders involving the immune mechanism: Secondary | ICD-10-CM | POA: Insufficient documentation

## 2012-12-03 DIAGNOSIS — IMO0002 Reserved for concepts with insufficient information to code with codable children: Secondary | ICD-10-CM | POA: Insufficient documentation

## 2012-12-03 DIAGNOSIS — Y92410 Unspecified street and highway as the place of occurrence of the external cause: Secondary | ICD-10-CM | POA: Insufficient documentation

## 2012-12-03 DIAGNOSIS — Y9389 Activity, other specified: Secondary | ICD-10-CM | POA: Insufficient documentation

## 2012-12-03 DIAGNOSIS — S7012XA Contusion of left thigh, initial encounter: Secondary | ICD-10-CM

## 2012-12-03 DIAGNOSIS — I10 Essential (primary) hypertension: Secondary | ICD-10-CM | POA: Insufficient documentation

## 2012-12-03 DIAGNOSIS — F411 Generalized anxiety disorder: Secondary | ICD-10-CM | POA: Insufficient documentation

## 2012-12-03 DIAGNOSIS — Z8719 Personal history of other diseases of the digestive system: Secondary | ICD-10-CM | POA: Insufficient documentation

## 2012-12-03 DIAGNOSIS — Z79899 Other long term (current) drug therapy: Secondary | ICD-10-CM | POA: Insufficient documentation
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
## 2012-12-03 NOTE — ED Provider Notes (Signed)
Medical screening examination/treatment/procedure(s) were performed by non-physician practitioner and as supervising physician I was immediately available for consultation/collaboration.  Quindarrius Joplin R. Naif Alabi, MD 12/03/12 1211 
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
## 2012-12-25 ENCOUNTER — Encounter: Payer: Self-pay | Admitting: Internal Medicine

## 2012-12-25 ENCOUNTER — Ambulatory Visit (INDEPENDENT_AMBULATORY_CARE_PROVIDER_SITE_OTHER): Payer: Medicare HMO | Admitting: Internal Medicine

## 2012-12-25 VITALS — BP 118/72 | HR 58 | Temp 98.0°F

## 2012-12-25 DIAGNOSIS — M549 Dorsalgia, unspecified: Secondary | ICD-10-CM

## 2012-12-25 DIAGNOSIS — M6580 Other synovitis and tenosynovitis, unspecified site: Secondary | ICD-10-CM

## 2012-12-25 DIAGNOSIS — M76899 Other specified enthesopathies of unspecified lower limb, excluding foot: Secondary | ICD-10-CM

## 2012-12-25 MED ORDER — PREDNISONE 10 MG PO TABS: ORAL_TABLET | ORAL | Status: DC
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
## 2012-12-25 NOTE — Patient Instructions (Signed)
Tendinitis  Tendinitis is swelling and inflammation of the tendons. Tendons are band-like tissues that connect muscle to bone. Tendinitis commonly occurs in the:    Shoulders (rotator cuff).   Heels (Achilles tendon).   Elbows (triceps tendon).  CAUSES  Tendinitis is usually caused by overusing the tendon, muscles, and joints involved. When the tissue surrounding a tendon (synovium) becomes inflamed, it is called tenosynovitis. Tendinitis commonly develops in people whose jobs require repetitive motions.  SYMPTOMS   Pain.   Tenderness.   Mild swelling.  DIAGNOSIS  Tendinitis is usually diagnosed by physical exam. Your caregiver may also order X-rays or other imaging tests.  TREATMENT  Your caregiver may recommend certain medicines or exercises for your treatment.  HOME CARE INSTRUCTIONS    Use a sling or splint for as long as directed by your caregiver until the pain decreases.   Put ice on the injured area.   Put ice in a plastic bag.   Place a towel between your skin and the bag.   Leave the ice on for 15-20 minutes, 3-4 times a day.   Avoid using the limb while the tendon is painful. Perform gentle range of motion exercises only as directed by your caregiver. Stop exercises if pain or discomfort increase, unless directed otherwise by your caregiver.   Only take over-the-counter or prescription medicines for pain, discomfort, or fever as directed by your caregiver.  SEEK MEDICAL CARE IF:    Your pain and swelling increase.   You develop new, unexplained symptoms, especially increased numbness in the hands.  MAKE SURE YOU:    Understand these instructions.   Will watch your condition.   Will get help right away if you are not doing well or get worse.  Document Released: 04/08/2000 Document Revised: 07/04/2011 Document Reviewed: 06/28/2010  [employer] Patient Information 2014 [employer].

## 2012-12-28 ENCOUNTER — Other Ambulatory Visit: Payer: Self-pay

## 2012-12-28 MED ORDER — BUSPIRONE HCL 30 MG PO TABS: 30.0000 mg | ORAL_TABLET | Freq: Two times a day (BID) | ORAL | Status: DC

## 2012-12-28 NOTE — Telephone Encounter (Signed)
Pt request refill buspar to [facility]. # [phone] x 0 and pt scheduled appt 01/07/13 for med refills.Medication phoned to [facility] as instructed.

## 2012-12-29 ENCOUNTER — Encounter: Payer: Self-pay | Admitting: Internal Medicine

## 2012-12-31 ENCOUNTER — Other Ambulatory Visit: Payer: Self-pay | Admitting: *Deleted

## 2012-12-31 MED ORDER — BUSPIRONE HCL 30 MG PO TABS: 30.0000 mg | ORAL_TABLET | Freq: Two times a day (BID) | ORAL | Status: DC

## 2012-12-31 MED ORDER — METOPROLOL SUCCINATE ER 25 MG PO TB24: 25.0000 mg | ORAL_TABLET | Freq: Two times a day (BID) | ORAL | Status: DC

## 2013-01-07 ENCOUNTER — Encounter: Payer: Self-pay | Admitting: Family Medicine

## 2013-01-07 ENCOUNTER — Ambulatory Visit (INDEPENDENT_AMBULATORY_CARE_PROVIDER_SITE_OTHER): Payer: Medicare HMO | Admitting: Family Medicine

## 2013-01-07 VITALS — BP 122/70 | HR 72 | Temp 98.6°F | Ht 65.0 in | Wt 233.0 lb

## 2013-01-07 DIAGNOSIS — F411 Generalized anxiety disorder: Secondary | ICD-10-CM

## 2013-01-07 DIAGNOSIS — I10 Essential (primary) hypertension: Secondary | ICD-10-CM

## 2013-01-07 MED ORDER — METOPROLOL SUCCINATE ER 25 MG PO TB24: 25.0000 mg | ORAL_TABLET | Freq: Two times a day (BID) | ORAL | Status: DC

## 2013-01-07 MED ORDER — BUSPIRONE HCL 30 MG PO TABS: 30.0000 mg | ORAL_TABLET | Freq: Two times a day (BID) | ORAL | Status: DC

## 2013-01-07 NOTE — Patient Instructions (Addendum)
Good to see you. I have refilled your medications.  Hang in there.
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
## 2013-01-26 ENCOUNTER — Telehealth: Payer: Self-pay | Admitting: Family Medicine

## 2013-01-26 NOTE — Telephone Encounter (Signed)
Pt called in wanting to speak to triage, I explained to her that I could make her an appointment to come in today for her symptoms of severe abdominal pain.  Patient says that she has not vomited but feels very nauseated and has had nothing to eat or drink, she stated maybe it was norovirus.  I offered her an appointment but she said that she is too far Cassandra Mcconnell).  Patient would like a call to better assist her needs. She can be reached at [phone]

## 2013-01-26 NOTE — Telephone Encounter (Signed)
Called pt she stated she started having some diarrhea on yesterday, but now she is have severe abdominal pain more on the left side. Feel like menustral cramps but worst. Denies fever. Inform pt she need to be evaluated by md. She states she lives in [place] and wouldn't be able to get here. Her husband is at work and she not able to drive. Inform her to go to nearest urgent care or ER since she is having severe abdominal pain. She states when her husband get home she will have him to take her...Raechel Chute

## 2013-02-12 ENCOUNTER — Ambulatory Visit: Payer: Self-pay | Admitting: Family Medicine

## 2013-02-28 ENCOUNTER — Other Ambulatory Visit: Payer: Self-pay

## 2013-05-24 ENCOUNTER — Other Ambulatory Visit: Payer: Self-pay | Admitting: Family Medicine

## 2013-05-24 MED ORDER — ALPRAZOLAM 0.5 MG PO TABS: 0.5000 mg | ORAL_TABLET | Freq: Three times a day (TID) | ORAL | Status: DC | PRN

## 2013-05-24 NOTE — Telephone Encounter (Signed)
Lm on pts vm informing her Rx has been faxed to requested pharmacy.  

## 2013-05-24 NOTE — Telephone Encounter (Signed)
Last filled 12/31/2012.

## 2013-05-31 ENCOUNTER — Encounter: Payer: Self-pay | Admitting: Internal Medicine

## 2013-05-31 ENCOUNTER — Ambulatory Visit (INDEPENDENT_AMBULATORY_CARE_PROVIDER_SITE_OTHER): Payer: Commercial Managed Care - HMO | Admitting: Internal Medicine

## 2013-05-31 VITALS — BP 116/74 | HR 59 | Temp 97.3°F | Wt 241.0 lb

## 2013-05-31 DIAGNOSIS — L309 Dermatitis, unspecified: Secondary | ICD-10-CM

## 2013-05-31 DIAGNOSIS — F329 Major depressive disorder, single episode, unspecified: Secondary | ICD-10-CM | POA: Insufficient documentation

## 2013-05-31 DIAGNOSIS — K219 Gastro-esophageal reflux disease without esophagitis: Secondary | ICD-10-CM

## 2013-05-31 DIAGNOSIS — M546 Pain in thoracic spine: Secondary | ICD-10-CM

## 2013-05-31 DIAGNOSIS — F339 Major depressive disorder, recurrent, unspecified: Secondary | ICD-10-CM | POA: Insufficient documentation

## 2013-05-31 DIAGNOSIS — F3289 Other specified depressive episodes: Secondary | ICD-10-CM

## 2013-05-31 DIAGNOSIS — F32A Depression, unspecified: Secondary | ICD-10-CM

## 2013-05-31 DIAGNOSIS — L259 Unspecified contact dermatitis, unspecified cause: Secondary | ICD-10-CM

## 2013-05-31 MED ORDER — TRIAMCINOLONE ACETONIDE 0.1 % EX CREA: 1.0000 "application " | TOPICAL_CREAM | Freq: Two times a day (BID) | CUTANEOUS | Status: DC

## 2013-05-31 MED ORDER — CITALOPRAM HYDROBROMIDE 20 MG PO TABS: 20.0000 mg | ORAL_TABLET | Freq: Every day | ORAL | Status: DC

## 2013-05-31 NOTE — Progress Notes (Signed)
Pre-visit discussion using our clinic review tool. No additional management support is needed unless otherwise documented below in the visit note.  

## 2013-05-31 NOTE — Assessment & Plan Note (Signed)
Seems to be getting worse I think weight reduction would be beneficial Will refer to [facility] per pt request Rosaria Ferries will call you with the referral info
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
## 2013-05-31 NOTE — Patient Instructions (Signed)

## 2013-05-31 NOTE — Assessment & Plan Note (Signed)
Seems to be worse lately Possibly stress related Advised her weight reduction would help Will treat the stress with Celexa Advised her she needs to take Prilosec on a daily basis, taking it prn is not effective

## 2013-05-31 NOTE — Assessment & Plan Note (Signed)
Stress related She is already on Buspar and xanax Will trial celexa  RTC in 4 weeks to reassess

## 2013-06-10 ENCOUNTER — Ambulatory Visit (HOSPITAL_COMMUNITY): Payer: Self-pay | Admitting: Physical Therapy

## 2013-06-13 ENCOUNTER — Telehealth: Payer: Self-pay

## 2013-06-13 MED ORDER — CITALOPRAM HYDROBROMIDE 20 MG PO TABS: 20.0000 mg | ORAL_TABLET | Freq: Every day | ORAL | Status: DC

## 2013-06-13 NOTE — Telephone Encounter (Signed)
90 day supply sent mail order

## 2013-06-13 NOTE — Telephone Encounter (Signed)
Ok to send in 3 month supply 

## 2013-06-13 NOTE — Telephone Encounter (Signed)
Pt was started on Celexa 05/31/13 and pt was to cb to advise how pt responding to Celexa; pt said Celexa is helping and pt has had some drowsiness but not bothering pt. Pt request 3 month rx sent [facility] for Celexa.Please advise.* ( in office note pt was to return in 4 weeks to reassess; no future appt scheduled.)

## 2013-06-17 ENCOUNTER — Telehealth: Payer: Self-pay | Admitting: *Deleted

## 2013-06-17 ENCOUNTER — Ambulatory Visit (HOSPITAL_COMMUNITY): Payer: Self-pay | Admitting: Physical Therapy

## 2013-06-17 NOTE — Telephone Encounter (Signed)
Received prior auth request for Celexa.  Auth paperwork obtained, and placed in your inbox.

## 2013-06-17 NOTE — Telephone Encounter (Signed)
Signed and in my box. 

## 2013-06-19 NOTE — Telephone Encounter (Signed)
Forms were submitted, pending response from insurance company. 

## 2013-06-25 ENCOUNTER — Other Ambulatory Visit: Payer: Self-pay

## 2013-06-25 NOTE — Telephone Encounter (Signed)
Submitted prior auth for Celexa on [email or web address]

## 2013-06-28 NOTE — Telephone Encounter (Signed)
Fax received indicating Rx approved through 04/24/2014. Sent to be scanned. Spoke to pt and advised.

## 2013-08-14 ENCOUNTER — Other Ambulatory Visit: Payer: Self-pay | Admitting: Internal Medicine

## 2013-08-21 ENCOUNTER — Other Ambulatory Visit: Payer: Self-pay | Admitting: Internal Medicine

## 2013-08-21 NOTE — Addendum Note (Signed)
Addended by: Lurlean Nanny on: 08/21/2013 11:09 AM   Modules accepted: Orders

## 2013-08-21 NOTE — Telephone Encounter (Signed)
Dr. A pt 

## 2013-08-21 NOTE — Telephone Encounter (Signed)
Last filled 06/13/13--please advise if this okay to send--view previous msg

## 2013-08-21 NOTE — Telephone Encounter (Signed)
Pt request refill of Celexa to [facility]; pt said she has 3 weeks of med left but takes 2 weeks to get medication from mail order pharmacy.Please advise.

## 2013-08-22 NOTE — Telephone Encounter (Signed)
Was this done yesterday by Dr. Deborra Medina?

## 2013-08-22 NOTE — Telephone Encounter (Signed)
Yes it say receipt to pharmacy confirmed

## 2013-08-22 NOTE — Telephone Encounter (Signed)
Pt request status of refill;advised done.

## 2013-09-09 ENCOUNTER — Ambulatory Visit (INDEPENDENT_AMBULATORY_CARE_PROVIDER_SITE_OTHER): Payer: Medicare HMO | Admitting: Internal Medicine

## 2013-09-09 ENCOUNTER — Encounter: Payer: Self-pay | Admitting: Internal Medicine

## 2013-09-09 ENCOUNTER — Ambulatory Visit (INDEPENDENT_AMBULATORY_CARE_PROVIDER_SITE_OTHER)
Admission: RE | Admit: 2013-09-09 | Discharge: 2013-09-09 | Disposition: A | Discharge status: Home / Self Care | Payer: Medicare HMO | Source: Ambulatory Visit | Attending: Internal Medicine | Admitting: Internal Medicine

## 2013-09-09 ENCOUNTER — Other Ambulatory Visit: Payer: Self-pay | Admitting: Internal Medicine

## 2013-09-09 VITALS — BP 128/74 | HR 63 | Temp 97.8°F | Wt 248.0 lb

## 2013-09-09 DIAGNOSIS — M5430 Sciatica, unspecified side: Secondary | ICD-10-CM

## 2013-09-09 DIAGNOSIS — M545 Low back pain, unspecified: Secondary | ICD-10-CM

## 2013-09-09 MED ORDER — PREDNISONE 10 MG PO TABS
ORAL_TABLET | ORAL | Status: DC
Start: 2013-09-09 — End: 2013-09-12

## 2013-09-09 NOTE — Progress Notes (Signed)
Subjective:    Patient ID: Cassandra Mcconnell, female    DOB: [birth year], 48 y.o.   MRN: [id number]  HPI  Pt presents to the clinic today with c/o back pain. This started years ago but had gotten much worse over the last 4-5 months. She reports the pain starts in her mid back and radiates down her left leg.She denies any specific injury to the area. She has been taking tylenol without any relief. She is obese and continues to steadily gain weight. She denies loss of bowel or bladder.  Review of Systems  Past Medical History  Diagnosis Date  . Anxiety   . GERD (gastroesophageal reflux disease)   . Hyperlipidemia   . Hypertension     Current Outpatient Prescriptions  Medication Sig Dispense Refill  . acetaminophen (TYLENOL) 500 MG tablet Take 1,000 mg by mouth every 6 (six) hours as needed. For pain       . ALPRAZolam (XANAX) 0.5 MG tablet Take 1 tablet (0.5 mg total) by mouth 3 (three) times daily as needed for sleep.  60 tablet  0  . busPIRone (BUSPAR) 30 MG tablet Take 1 tablet (30 mg total) by mouth 2 (two) times daily.  180 tablet  3  . citalopram (CELEXA) 20 MG tablet TAKE 1 TABLET (20 MG TOTAL) BY MOUTH DAILY.  90 tablet  0  . ibuprofen (ADVIL,MOTRIN) 200 MG tablet Take 800 mg by mouth daily as needed. For cramps      . metoprolol succinate (TOPROL-XL) 25 MG 24 hr tablet Take 1 tablet (25 mg total) by mouth 2 (two) times daily.  180 tablet  3  . triamcinolone cream (KENALOG) 0.1 % Apply 1 application topically 2 (two) times daily.  30 g  0   No current facility-administered medications for this visit.    Allergies  Allergen Reactions  . Penicillins Shortness Of Breath  . Azithromycin Swelling  . Cimetidine Other (See Comments)    unknown  . Conjugated Estrogens Other (See Comments)    emotional  . Cyclobenzaprine Hcl     REACTION: disorientation  . Dicyclomine Hcl Other (See Comments)    unknown  . Lisinopril     REACTION: RASH AND MOUTH SORE  . Paroxetine   .  Rosuvastatin Dermatitis  . Sulfa Antibiotics Hives and Itching  . Wellbutrin [Bupropion Hcl] Other (See Comments)    Emotional, makes depression worse  . Ciprofloxacin Itching and Rash  . Doxycycline Hyclate Itching and Rash  . Erythromycin Base Itching and Rash    Family History  Problem Relation Age of Onset  . COPD Mother   . Hypertension Mother   . GER disease Mother   . Heart attack Father   . Cancer Father     prostate  . Hyperlipidemia Brother   . Hypertension Brother   . Heart attack Paternal Grandmother   . Heart attack Paternal Grandfather   . Stroke Paternal Grandfather   . Hyperlipidemia Brother   . Hypertension Brother     History   Social History  . Marital Status: Married    Spouse Name: N/A    Number of Children: 1  . Years of Education: N/A   Occupational History  . stay at home mom    Social History Main Topics  . Smoking status: Never Smoker   . Smokeless tobacco: Never Used  . Alcohol Use: No  . Drug Use: No  . Sexual Activity: Not on file   Other Topics Concern  .  Not on file   Social History Narrative  . No narrative on file     Constitutional: Denies fever, malaise, fatigue, headache or abrupt weight changes.  Musculoskeletal: Pt reports low back pain. Denies muscle pain or joint pain and swelling.  Neurological: Pt reports sharp, shooting pain down her left leg. Denies dizziness, difficulty with memory, difficulty with speech or problems with balance and coordination.   No other specific complaints in a complete review of systems (except as listed in HPI above).     Objective:   Physical Exam   BP 128/74  Pulse 63  Temp(Src) 97.8 F (36.6 C) (Oral)  Wt 248 lb (112.492 kg)  SpO2 99% Wt Readings from Last 3 Encounters:  09/09/13 248 lb (112.492 kg)  05/31/13 241 lb (109.317 kg)  01/07/13 233 lb (105.688 kg)    General: Appears her stated age, obese but well developed, well nourished in NAD. Cardiovascular: Normal rate  and rhythm. S1,S2 noted.  No murmur, rubs or gallops noted. No JVD or BLE edema. No carotid bruits noted. Pulmonary/Chest: Normal effort and positive vesicular breath sounds. No respiratory distress. No wheezes, rales or ronchi noted.  Musculoskeletal: Decreased flexion and extension of the back secondary to pain. Strength 5/5 BLE. Normal gait. Pain with palpation of the lumbar spine. Neurological: Alert and oriented. Cranial nerves II-XII intact. Coordination normal. +DTRs bilaterally. Positive straight leg raise on the left.   BMET    Component Value Date/Time   NA 141 10/04/2012 1550   K 4.0 10/04/2012 1550   CL 108 10/04/2012 1550   CO2 26 10/04/2012 1550   GLUCOSE 86 10/04/2012 1550   BUN 10 10/04/2012 1550   CREATININE 0.7 10/04/2012 1550   CALCIUM 9.4 10/04/2012 1550   GFRNONAA >60 08/02/2008 0444   GFRAA  Value: >60        The eGFR has been calculated using the MDRD equation. This calculation has not been validated in all clinical situations. eGFR's persistently <60 mL/min signify possible Chronic Kidney Disease. 08/02/2008 0444    Lipid Panel     Component Value Date/Time   CHOL 188 10/16/2007 0936   TRIG 146 10/16/2007 0936   HDL 22.9* 10/16/2007 0936   CHOLHDL 8.2 CALC 10/16/2007 0936   VLDL 29 10/16/2007 0936   LDLCALC 136* 10/16/2007 0936    CBC    Component Value Date/Time   WBC 10.2 08/02/2008 0444   RBC 4.54 08/02/2008 0444   HGB 13.4 08/02/2008 0444   HCT 38.4 08/02/2008 0444   PLT 228 08/02/2008 0444   MCV 84.7 08/02/2008 0444   MCHC 34.8 08/02/2008 0444   RDW 14.8 08/02/2008 0444   LYMPHSABS 1.5 08/02/2008 0444   MONOABS 0.7 08/02/2008 0444   EOSABS 0.1 08/02/2008 0444   BASOSABS 0.0 08/02/2008 0444    Hgb A1C No results found for this basename: HGBA1C        Assessment & Plan:   Low back pain, ? Sciatica neuralgia:  Stretching exercises given Discussed weight loss and how she would benefit from it eRx for pred taper Will get xray of lumber spine- will likely need  MRI  Will follow up after xrays are back

## 2013-09-09 NOTE — Patient Instructions (Signed)

## 2013-09-09 NOTE — Progress Notes (Signed)
Pre visit review using our clinic review tool, if applicable. No additional management support is needed unless otherwise documented below in the visit note. 

## 2013-09-12 ENCOUNTER — Telehealth: Payer: Self-pay | Admitting: Family Medicine

## 2013-09-12 NOTE — Telephone Encounter (Signed)
Pt is aware as instructed and Rx called into pharmacy--please confirm correct Rx

## 2013-09-12 NOTE — Telephone Encounter (Signed)
Please read below--how would you like me to take care of this?--please advise

## 2013-09-12 NOTE — Telephone Encounter (Signed)
Yes it is right

## 2013-09-12 NOTE — Addendum Note (Signed)
Addended by: Lurlean Nanny on: 09/12/2013 02:31 PM   Modules accepted: Orders

## 2013-09-12 NOTE — Telephone Encounter (Signed)
D/c prednisone. Ok to call in tramadol 50 mg tabs take 1 Q8H prn pain, #30, 0 refills- will likely not be as effective as prednsione

## 2013-09-12 NOTE — Telephone Encounter (Signed)
Pt is aware and will stop prednisone--pt states she would like to try the Tramadol--however she has been experiencing muscle spasms and wants to know what she should do about inflammation--please advise

## 2013-09-12 NOTE — Telephone Encounter (Signed)
tramdol is an antiinflammatory like prednisone, it should help. Try stretching also

## 2013-09-12 NOTE — Telephone Encounter (Signed)
Patient Information:  Caller Name: Pura  Phone: [phone]  Patient: Cassandra Mcconnell, Cassandra Mcconnell  Gender: Female  DOB: 1966/02/19  Age: 48 Years  PCP: Arnette Norris [facility])  Pregnant: No  Office Follow Up:  Does the office need to follow up with this patient?: Yes  Instructions For The Office: Please f/u with pt with recommendations to replace the prednisone, thank you.   Symptoms  Reason For Call & Symptoms: Pt reports she started prednisone on Monday 09/09/13 and developed red rash on the face, "like my face is burned."  Reviewed Health History In EMR: Yes  Reviewed Medications In EMR: Yes  Reviewed Allergies In EMR: Yes  Reviewed Surgeries / Procedures: Yes  Date of Onset of Symptoms: 09/10/2013 OB / GYN:  LMP: Unknown  Guideline(s) Used:  Rash or Redness - Localized  Rash - Widespread on Drugs - Drug Reaction  Disposition Per Guideline:   Discuss with PCP and Callback by Nurse Today  Reason For Disposition Reached:   Niacin flush suspected  Advice Given:  Call Back If:  You become worse.  Patient Will Follow Care Advice:  YES

## 2013-09-19 ENCOUNTER — Ambulatory Visit (HOSPITAL_COMMUNITY)
Admission: RE | Admit: 2013-09-19 | Discharge: 2013-09-19 | Disposition: A | Discharge status: Home / Self Care | Payer: Medicare HMO | Source: Ambulatory Visit | Attending: Internal Medicine | Admitting: Internal Medicine

## 2013-09-19 DIAGNOSIS — M545 Low back pain, unspecified: Secondary | ICD-10-CM

## 2013-09-19 DIAGNOSIS — M47817 Spondylosis without myelopathy or radiculopathy, lumbosacral region: Secondary | ICD-10-CM | POA: Insufficient documentation

## 2013-10-07 ENCOUNTER — Other Ambulatory Visit: Payer: Self-pay | Admitting: *Deleted

## 2013-10-07 MED ORDER — TRAMADOL HCL 50 MG PO TABS: 50.0000 mg | ORAL_TABLET | Freq: Three times a day (TID) | ORAL | Status: DC | PRN

## 2013-10-07 NOTE — Telephone Encounter (Signed)
Pt requesting medication refill. Last f/u appt 12/2012 with no future appts scheduled. pls advise 

## 2013-10-07 NOTE — Telephone Encounter (Signed)
plz phone in. 

## 2013-10-08 NOTE — Telephone Encounter (Signed)
Rx called in to requested pharmacy 

## 2013-10-24 ENCOUNTER — Other Ambulatory Visit: Payer: Self-pay | Admitting: *Deleted

## 2013-10-31 ENCOUNTER — Ambulatory Visit (INDEPENDENT_AMBULATORY_CARE_PROVIDER_SITE_OTHER): Payer: Medicare HMO | Admitting: Family Medicine

## 2013-10-31 ENCOUNTER — Encounter: Payer: Self-pay | Admitting: Family Medicine

## 2013-10-31 VITALS — BP 122/74 | HR 55 | Temp 97.5°F | Ht 65.0 in | Wt 248.0 lb

## 2013-10-31 DIAGNOSIS — I10 Essential (primary) hypertension: Secondary | ICD-10-CM

## 2013-10-31 DIAGNOSIS — Z0000 Encounter for general adult medical examination without abnormal findings: Secondary | ICD-10-CM

## 2013-10-31 DIAGNOSIS — E7800 Pure hypercholesterolemia, unspecified: Secondary | ICD-10-CM

## 2013-10-31 DIAGNOSIS — F411 Generalized anxiety disorder: Secondary | ICD-10-CM

## 2013-10-31 LAB — CBC WITH DIFFERENTIAL/PLATELET
BASOS ABS: 0 10*3/uL (ref 0.0–0.1)
Basophils Relative: 0.3 % (ref 0.0–3.0)
EOS ABS: 0.2 10*3/uL (ref 0.0–0.7)
Eosinophils Relative: 1.8 % (ref 0.0–5.0)
HCT: 39.7 % (ref 36.0–46.0)
HEMOGLOBIN: 13.2 g/dL (ref 12.0–15.0)
LYMPHS PCT: 28.7 % (ref 12.0–46.0)
Lymphs Abs: 2.4 10*3/uL (ref 0.7–4.0)
MCHC: 33.2 g/dL (ref 30.0–36.0)
MCV: 87.8 fl (ref 78.0–100.0)
Monocytes Absolute: 0.7 10*3/uL (ref 0.1–1.0)
Monocytes Relative: 8.1 % (ref 3.0–12.0)
NEUTROS ABS: 5.1 10*3/uL (ref 1.4–7.7)
NEUTROS PCT: 61.1 % (ref 43.0–77.0)
Platelets: 272 10*3/uL (ref 150.0–400.0)
RBC: 4.52 Mil/uL (ref 3.87–5.11)
RDW: 14.6 % (ref 11.5–15.5)
WBC: 8.3 10*3/uL (ref 4.0–10.5)

## 2013-10-31 LAB — COMPREHENSIVE METABOLIC PANEL
ALT: 30 U/L (ref 0–35)
AST: 33 U/L (ref 0–37)
Albumin: 4 g/dL (ref 3.5–5.2)
Alkaline Phosphatase: 58 U/L (ref 39–117)
BILIRUBIN TOTAL: 0.5 mg/dL (ref 0.2–1.2)
BUN: 11 mg/dL (ref 6–23)
CHLORIDE: 103 meq/L (ref 96–112)
CO2: 25 mEq/L (ref 19–32)
Calcium: 9.6 mg/dL (ref 8.4–10.5)
Creatinine, Ser: 0.8 mg/dL (ref 0.4–1.2)
GFR: 78.68 mL/min (ref >60.00)
Glucose, Bld: 88 mg/dL (ref 70–99)
Potassium: 3.9 mEq/L (ref 3.5–5.1)
Sodium: 136 mEq/L (ref 135–145)
Total Protein: 6.9 g/dL (ref 6.0–8.3)

## 2013-10-31 LAB — TSH: TSH: 0.25 u[IU]/mL — ABNORMAL LOW (ref 0.35–4.50)

## 2013-10-31 LAB — LDL CHOLESTEROL, DIRECT: LDL DIRECT: 136.4 mg/dL

## 2013-10-31 NOTE — Assessment & Plan Note (Signed)
Well controlled on current rx. No changes. 

## 2013-10-31 NOTE — Progress Notes (Signed)
48 yo with h/o HTN, anxiety here for follow up.    HTN- started on metoprolol in June 2014 (saw Dr. Damita Dunnings).  Could not tolerate ACEI or propranolol previously. Feels she is doing well on this. No CP or SOB. No HA or blurred vision  Anxiety- taking celexa 20 mg daily and Buspar 30 mg twice daily .      Feels symptoms well controlled. Denies any anxiety, depression, SI or HI.  Lab Results  Component Value Date   CREATININE 0.7 10/04/2012   Lab Results  Component Value Date   CHOL 188 10/16/2007   HDL 22.9* 10/16/2007   LDLCALC 136* 10/16/2007   LDLDIRECT 94.8 11/07/2006   TRIG 146 10/16/2007   CHOLHDL 8.2 CALC 10/16/2007   Lab Results  Component Value Date   TSH 1.07 10/16/2007     Patient Active Problem List   Diagnosis Date Noted  . Depression 05/31/2013  . MORBID OBESITY 12/04/2009  . PAIN IN THORACIC SPINE 11/11/2008  . 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 HYPERTROPHY, BREAST 11/20/2006  . ANXIETY /PANIC ATTACKS 11/10/2006  . GERD 11/10/2006  . HYPERCHOLESTEROLEMIA, PURE 11/09/2006  . HYPERTENSION, BENIGN ESSENTIAL 11/09/2006  . CHOLECYSTECTOMY, HX OF 11/09/2006   Past Medical History  Diagnosis Date  . Anxiety   . GERD (gastroesophageal reflux disease)   . Hyperlipidemia   . Hypertension    Past Surgical History  Procedure Laterality Date  . Septoplasty  1988  . Tonsillectomy  1991  . Cholecystectomy      1998  . Back surgery  2004    l4/5 fusion   History  Substance Use Topics  . Smoking status: Never Smoker   . Smokeless tobacco: Never Used  . Alcohol Use: No   Family History  Problem Relation Age of Onset  . COPD Mother   . Hypertension Mother   . GER disease Mother   . Heart attack Father   . Cancer Father     prostate  . Hyperlipidemia Brother   . Hypertension Brother   . Heart attack Paternal Grandmother   . Heart attack Paternal Grandfather   . Stroke Paternal Grandfather   . Hyperlipidemia Brother   . Hypertension Brother     Allergies  Allergen Reactions  . Penicillins Shortness Of Breath  . Azithromycin Swelling  . Cimetidine Other (See Comments)    unknown  . Conjugated Estrogens Other (See Comments)    emotional  . Cyclobenzaprine Hcl     REACTION: disorientation  . Dicyclomine Hcl Other (See Comments)    unknown  . Lisinopril     REACTION: RASH AND MOUTH SORE  . Paroxetine   . Rosuvastatin Dermatitis  . Sulfa Antibiotics Hives and Itching  . Wellbutrin [Bupropion Hcl] Other (See Comments)    Emotional, makes depression worse  . Ciprofloxacin Itching and Rash  . Doxycycline Hyclate Itching and Rash  . Erythromycin Base Itching and Rash  . Prednisone Rash   Current Outpatient Prescriptions on File Prior to Visit  Medication Sig Dispense Refill  . acetaminophen (TYLENOL) 500 MG tablet Take 1,000 mg by mouth every 6 (six) hours as needed. For pain       . ALPRAZolam (XANAX) 0.5 MG tablet Take 1 tablet (0.5 mg total) by mouth 3 (three) times daily as needed for sleep.  60 tablet  0  . busPIRone (BUSPAR) 30 MG tablet Take 1 tablet (30 mg total) by mouth 2 (two) times daily.  180 tablet  3  .  citalopram (CELEXA) 20 MG tablet TAKE 1 TABLET (20 MG TOTAL) BY MOUTH DAILY.  90 tablet  0  . ibuprofen (ADVIL,MOTRIN) 200 MG tablet Take 800 mg by mouth daily as needed. For cramps      . metoprolol succinate (TOPROL-XL) 25 MG 24 hr tablet Take 1 tablet (25 mg total) by mouth 2 (two) times daily.  180 tablet  3  . traMADol (ULTRAM) 50 MG tablet Take 1 tablet (50 mg total) by mouth every 8 (eight) hours as needed.  30 tablet  0  . triamcinolone cream (KENALOG) 0.1 % Apply 1 application topically 2 (two) times daily.  30 g  0   No current facility-administered medications on file prior to visit.   The PMH, PSH, Social History, Family History, Medications, and allergies have been reviewed in [facility], and have been updated if relevant.   ROS: See HPI  Physical exam: BP 122/74  Pulse 55  Temp(Src) 97.5 F  (36.4 C) (Oral)  Ht 5\' 5"  (1.651 m)  Wt 248 lb (112.492 kg)  BMI 41.27 kg/m2  SpO2 99%  LMP 10/25/2013  General:  Well-developed,well-nourished,in no acute distress; alert,appropriate and cooperative throughout examination Head:  normocephalic and atraumatic.   Eyes:  vision grossly intact, pupils equal, pupils round, and pupils reactive to light.   Ears:  R ear normal and L ear normal.   Nose:  no external deformity.   Mouth:  good dentition.   Lungs:  Normal respiratory effort, chest expands symmetrically. Lungs are clear to auscultation, no crackles or wheezes. Heart:  Normal rate and regular rhythm. S1 and S2 normal without gallop, murmur, click, rub or other extra sounds. Extremities:  No clubbing, cyanosis, edema, or deformity noted with normal full range of motion of all joints.   Neurologic:  alert & oriented X3 and gait normal.   Skin:  Intact without suspicious lesions or rashes Psych:  Cognition and judgment appear intact. Alert and cooperative with normal attention span and concentration. No apparent delusions, illusions, hallucinations

## 2013-10-31 NOTE — Assessment & Plan Note (Signed)
Stable on current rx. No changes. 

## 2013-10-31 NOTE — Assessment & Plan Note (Signed)
Overdue for labs. Check direct LDL today- not fasting.

## 2013-10-31 NOTE — Progress Notes (Signed)
Pre visit review using our clinic review tool, if applicable. No additional management support is needed unless otherwise documented below in the visit note. 

## 2013-10-31 NOTE — Patient Instructions (Signed)
Great to see you. We will call you with your lab results and you can view them online.  

## 2013-11-01 ENCOUNTER — Telehealth: Payer: Self-pay | Admitting: Family Medicine

## 2013-11-01 NOTE — Telephone Encounter (Signed)
Relevant patient education assigned to patient using Emmi. ° °

## 2013-11-05 ENCOUNTER — Other Ambulatory Visit: Payer: Self-pay | Admitting: Family Medicine

## 2013-11-05 MED ORDER — CITALOPRAM HYDROBROMIDE 20 MG PO TABS: ORAL_TABLET | ORAL | Status: DC

## 2013-11-05 NOTE — Telephone Encounter (Signed)
Waynettta, do you know what happened to her rx?

## 2013-11-05 NOTE — Telephone Encounter (Signed)
The original Rx was denied because she needed an OV. I contacted her and asked if she wanted it sent to a local pharmacy until she came to [place], but pt denied. She had not been seen since 12/2012.

## 2013-11-05 NOTE — Telephone Encounter (Signed)
Pt left v/m with same information as in mychart message re; to Celexa and pt would like cb about lab results as well.

## 2013-11-08 ENCOUNTER — Encounter: Payer: Self-pay | Admitting: Family Medicine

## 2013-11-29 ENCOUNTER — Other Ambulatory Visit: Payer: Self-pay | Admitting: Family Medicine

## 2013-11-29 NOTE — Telephone Encounter (Signed)
Ok to refill 

## 2013-12-02 ENCOUNTER — Other Ambulatory Visit: Payer: Self-pay | Admitting: Family Medicine

## 2013-12-02 DIAGNOSIS — R7989 Other specified abnormal findings of blood chemistry: Secondary | ICD-10-CM

## 2013-12-02 MED ORDER — TRAMADOL HCL 50 MG PO TABS: 50.0000 mg | ORAL_TABLET | Freq: Three times a day (TID) | ORAL | Status: DC | PRN

## 2013-12-02 NOTE — Telephone Encounter (Signed)
Rx called in to requested pharmacy 

## 2013-12-06 ENCOUNTER — Other Ambulatory Visit (INDEPENDENT_AMBULATORY_CARE_PROVIDER_SITE_OTHER): Payer: Medicare HMO

## 2013-12-06 ENCOUNTER — Encounter: Payer: Self-pay | Admitting: Radiology

## 2013-12-06 DIAGNOSIS — R7989 Other specified abnormal findings of blood chemistry: Secondary | ICD-10-CM

## 2013-12-06 DIAGNOSIS — R946 Abnormal results of thyroid function studies: Secondary | ICD-10-CM

## 2013-12-06 LAB — T4, FREE: FREE T4: 0.75 ng/dL (ref 0.60–1.60)

## 2013-12-06 LAB — TSH: TSH: 0.56 u[IU]/mL (ref 0.35–4.50)

## 2013-12-09 ENCOUNTER — Encounter: Payer: Self-pay | Admitting: *Deleted

## 2013-12-09 ENCOUNTER — Encounter: Payer: Self-pay | Admitting: Family Medicine

## 2013-12-31 ENCOUNTER — Encounter: Payer: Self-pay | Admitting: Family Medicine

## 2014-01-02 ENCOUNTER — Other Ambulatory Visit: Payer: Self-pay | Admitting: *Deleted

## 2014-01-02 MED ORDER — BUSPIRONE HCL 30 MG PO TABS: 30.0000 mg | ORAL_TABLET | Freq: Two times a day (BID) | ORAL | Status: DC

## 2014-01-02 MED ORDER — METOPROLOL SUCCINATE ER 25 MG PO TB24: 25.0000 mg | ORAL_TABLET | Freq: Two times a day (BID) | ORAL | Status: DC

## 2014-01-10 ENCOUNTER — Ambulatory Visit: Payer: Self-pay | Admitting: Family Medicine

## 2014-01-10 ENCOUNTER — Telehealth: Payer: Self-pay | Admitting: Family Medicine

## 2014-01-10 NOTE — Telephone Encounter (Signed)
See below my chart message   ','<More Detail >>          RE: Appointment Request  Cassandra Mcconnell  MRN: [id number] DOB: 16-Feb-1966     Pt Home: [phone]     Sent: Fri January 10, 2014 6:08 AM   Entered: [phone]     To: [facility] Support Pool                                 Message    I am unable to make this appt. I am in too much pain to drive myself there. Instead I would like to a referral to made Dr Glenna Fellows in [place] for me to see him about my back. All Dr Deborra Medina will do is increase my pain meds and I don't want anymore pain meds and for her to tell me there is nothing else that can be done. That's not true. There are other options besides pain meds. I've done my research. Thanks. Cassandra Mcconnell      ----- Message -----   From: Imagene Sheller   Sent: 01/06/2014 9:28 AM EDT   To: [name]   Subject: RE: Appointment Request      [name],   I have scheduled you with Dr. Deborra Medina this Friday 01/10/14 at 8 am.       Hiram Gash      ----- Message -----   From: [name]   Sent: 01/06/2014 3:22 AM EDT   To: Patient Appointment Schedule Request Mailing List   Subject: Appointment Request      Appointment Request From: [name]      With Provider: Arnette Norris, MD [-Primary Care Physician-]      Preferred Date Range: Any date 01/06/2014 or later      Preferred Times: Friday Morning      Reason for visit: Office Visit      Comments:   severe back pain / weight management

## 2014-01-10 NOTE — Telephone Encounter (Signed)
I saw her for back pain, but she will need appointment for me to put a referral in for her or she can followup the low back pain with her PCP

## 2014-01-10 NOTE — Telephone Encounter (Signed)
I have not seen her for this complaint so I cannot refer her to see a neurosurgeon.  She saw Webb Silversmith.  Will route to her.

## 2014-01-13 NOTE — Telephone Encounter (Signed)
Pt made f/u appt for 01/14/14

## 2014-01-14 ENCOUNTER — Ambulatory Visit (INDEPENDENT_AMBULATORY_CARE_PROVIDER_SITE_OTHER): Payer: Commercial Managed Care - HMO | Admitting: Internal Medicine

## 2014-01-14 ENCOUNTER — Encounter: Payer: Self-pay | Admitting: Internal Medicine

## 2014-01-14 VITALS — BP 118/70 | HR 61 | Temp 97.5°F | Wt 246.0 lb

## 2014-01-14 DIAGNOSIS — M5432 Sciatica, left side: Secondary | ICD-10-CM

## 2014-01-14 DIAGNOSIS — M5430 Sciatica, unspecified side: Secondary | ICD-10-CM

## 2014-01-14 DIAGNOSIS — M5442 Lumbago with sciatica, left side: Secondary | ICD-10-CM

## 2014-01-14 NOTE — Patient Instructions (Signed)
Sciatica Sciatica is pain, weakness, numbness, or tingling along the path of the sciatic nerve. The nerve starts in the lower back and runs down the back of each leg. The nerve controls the muscles in the lower leg and in the back of the knee, while also providing sensation to the back of the thigh, lower leg, and the sole of your foot. Sciatica is a symptom of another medical condition. For instance, nerve damage or certain conditions, such as a herniated disk or bone spur on the spine, pinch or put pressure on the sciatic nerve. This causes the pain, weakness, or other sensations normally associated with sciatica. Generally, sciatica only affects one side of the body. CAUSES   Herniated or slipped disc.  Degenerative disk disease.  A pain disorder involving the narrow muscle in the buttocks (piriformis syndrome).  Pelvic injury or fracture.  Pregnancy.  Tumor (rare). SYMPTOMS  Symptoms can vary from mild to very severe. The symptoms usually travel from the low back to the buttocks and down the back of the leg. Symptoms can include:  Mild tingling or dull aches in the lower back, leg, or hip.  Numbness in the back of the calf or sole of the foot.  Burning sensations in the lower back, leg, or hip.  Sharp pains in the lower back, leg, or hip.  Leg weakness.  Severe back pain inhibiting movement. These symptoms may get worse with coughing, sneezing, laughing, or prolonged sitting or standing. Also, being overweight may worsen symptoms. DIAGNOSIS  Your caregiver will perform a physical exam to look for common symptoms of sciatica. He or she may ask you to do certain movements or activities that would trigger sciatic nerve pain. Other tests may be performed to find the cause of the sciatica. These may include:  Blood tests.  X-rays.  Imaging tests, such as an MRI or CT scan. TREATMENT  Treatment is directed at the cause of the sciatic pain. Sometimes, treatment is not necessary  and the pain and discomfort goes away on its own. If treatment is needed, your caregiver may suggest:  Over-the-counter medicines to relieve pain.  Prescription medicines, such as anti-inflammatory medicine, muscle relaxants, or narcotics.  Applying heat or ice to the painful area.  Steroid injections to lessen pain, irritation, and inflammation around the nerve.  Reducing activity during periods of pain.  Exercising and stretching to strengthen your abdomen and improve flexibility of your spine. Your caregiver may suggest losing weight if the extra weight makes the back pain worse.  Physical therapy.  Surgery to eliminate what is pressing or pinching the nerve, such as a bone spur or part of a herniated disk. HOME CARE INSTRUCTIONS   Only take over-the-counter or prescription medicines for pain or discomfort as directed by your caregiver.  Apply ice to the affected area for 20 minutes, 3-4 times a day for the first 48-72 hours. Then try heat in the same way.  Exercise, stretch, or perform your usual activities if these do not aggravate your pain.  Attend physical therapy sessions as directed by your caregiver.  Keep all follow-up appointments as directed by your caregiver.  Do not wear high heels or shoes that do not provide proper support.  Check your mattress to see if it is too soft. A firm mattress may lessen your pain and discomfort. SEEK IMMEDIATE MEDICAL CARE IF:   You lose control of your bowel or bladder (incontinence).  You have increasing weakness in the lower back, pelvis, buttocks,   or legs.  You have redness or swelling of your back.  You have a burning sensation when you urinate.  You have pain that gets worse when you lie down or awakens you at night.  Your pain is worse than you have experienced in the past.  Your pain is lasting longer than 4 weeks.  You are suddenly losing weight without reason. MAKE SURE YOU:  Understand these  instructions.  Will watch your condition.  Will get help right away if you are not doing well or get worse. Document Released: 04/05/2001 Document Revised: 10/11/2011 Document Reviewed: 08/21/2011 ExitCare Patient Information 2015 [employer]. This information is not intended to replace advice given to you by your health care provider. Make sure you discuss any questions you have with your health care provider.  

## 2014-01-14 NOTE — Progress Notes (Signed)
Subjective:    Patient ID: Cassandra Mcconnell, female    DOB: 05/31/1965, 48 y.o.   MRN: [id number]  HPI  Pt presents to the clinic today to discuss referral for neurosurgeon for her low back pain. She was having sciatic type pain back in 08/2013. Xray at that time showed:  IMPRESSION:  Stable lumbar spine with prior L5-S1 fusion with good anatomic  alignment .  She also had a MRI which showed:  IMPRESSION:  Minimal degenerative changes of the facet joints at L4-5, slightly  progressed since 08/04/2007.  No other significant abnormalities.   She reports that her pain has not improved at all since May, although she has not followed up about this. She states "i know I have spinal stenosis", however the MRI shows no evidence of spinal stenosis. She reports that the pain medication is not helping and she does not want to be on narcotics all the time. She wants a second opinion from an expert. She would like referral to Dr. Carloyn Manner. Of note, she has had a previous L4-5 fusion from a different neurosurgeon, Dr. Saintclair Halsted in [place].  Review of Systems      Past Medical History  Diagnosis Date  . Anxiety   . GERD (gastroesophageal reflux disease)   . Hyperlipidemia   . Hypertension     Current Outpatient Prescriptions  Medication Sig Dispense Refill  . acetaminophen (TYLENOL) 500 MG tablet Take 1,000 mg by mouth every 6 (six) hours as needed. For pain       . busPIRone (BUSPAR) 30 MG tablet Take 1 tablet (30 mg total) by mouth 2 (two) times daily.  180 tablet  0  . citalopram (CELEXA) 20 MG tablet TAKE 1 TABLET (20 MG TOTAL) BY MOUTH DAILY.  90 tablet  3  . ibuprofen (ADVIL,MOTRIN) 200 MG tablet Take 800 mg by mouth daily as needed. For cramps      . metoprolol succinate (TOPROL-XL) 25 MG 24 hr tablet Take 1 tablet (25 mg total) by mouth 2 (two) times daily.  180 tablet  1  . traMADol (ULTRAM) 50 MG tablet Take 1 tablet (50 mg total) by mouth every 8 (eight) hours as needed.  30 tablet  0    . triamcinolone cream (KENALOG) 0.1 % Apply 1 application topically 2 (two) times daily.  30 g  0   No current facility-administered medications for this visit.    Allergies  Allergen Reactions  . Penicillins Shortness Of Breath  . Azithromycin Swelling  . Cimetidine Other (See Comments)    unknown  . Conjugated Estrogens Other (See Comments)    emotional  . Cyclobenzaprine Hcl     REACTION: disorientation  . Dicyclomine Hcl Other (See Comments)    unknown  . Lisinopril     REACTION: RASH AND MOUTH SORE  . Paroxetine   . Rosuvastatin Dermatitis  . Sulfa Antibiotics Hives and Itching  . Wellbutrin [Bupropion Hcl] Other (See Comments)    Emotional, makes depression worse  . Ciprofloxacin Itching and Rash  . Doxycycline Hyclate Itching and Rash  . Erythromycin Base Itching and Rash  . Prednisone Rash    Family History  Problem Relation Age of Onset  . COPD Mother   . Hypertension Mother   . GER disease Mother   . Heart attack Father   . Cancer Father     prostate  . Hyperlipidemia Brother   . Hypertension Brother   . Heart attack Paternal Grandmother   . Heart  attack Paternal Grandfather   . Stroke Paternal Grandfather   . Hyperlipidemia Brother   . Hypertension Brother     History   Social History  . Marital Status: Married    Spouse Name: N/A    Number of Children: 1  . Years of Education: N/A   Occupational History  . stay at home mom    Social History Main Topics  . Smoking status: Never Smoker   . Smokeless tobacco: Never Used  . Alcohol Use: No  . Drug Use: No  . Sexual Activity: Not on file   Other Topics Concern  . Not on file   Social History Narrative  . No narrative on file     Constitutional: Denies fever, malaise, fatigue, headache or abrupt weight changes.  Musculoskeletal: Pt reports low back pain. Denies decrease in range of motion, difficulty with gait, muscle pain or joint pain and swelling.     No other specific complaints  in a complete review of systems (except as listed in HPI above).   Objective:   Physical Exam   BP 118/70  Pulse 61  Temp(Src) 97.5 F (36.4 C) (Oral)  Wt 246 lb (111.585 kg)  SpO2 98%  LMP 12/13/2013 Wt Readings from Last 3 Encounters:  01/14/14 246 lb (111.585 kg)  10/31/13 248 lb (112.492 kg)  09/09/13 248 lb (112.492 kg)    General: Appears her stated age, obese but well developed, well nourished in NAD.  Cardiovascular: Normal rate and rhythm. S1,S2 noted.  No murmur, rubs or gallops noted. Pulmonary/Chest: Normal effort and positive vesicular breath sounds. No respiratory distress. No wheezes, rales or ronchi noted.  Musculoskeletal: Normal flexion and extension of the spine. No difficulty with gait. Strength 5/5 BLE.   BMET    Component Value Date/Time   NA 136 10/31/2013 1038   K 3.9 10/31/2013 1038   CL 103 10/31/2013 1038   CO2 25 10/31/2013 1038   GLUCOSE 88 10/31/2013 1038   BUN 11 10/31/2013 1038   CREATININE 0.8 10/31/2013 1038   CALCIUM 9.6 10/31/2013 1038   GFRNONAA >60 08/02/2008 0444   GFRAA  Value: >60        The eGFR has been calculated using the MDRD equation. This calculation has not been validated in all clinical situations. eGFR's persistently <60 mL/min signify possible Chronic Kidney Disease. 08/02/2008 0444    Lipid Panel     Component Value Date/Time   CHOL 188 10/16/2007 0936   TRIG 146 10/16/2007 0936   HDL 22.9* 10/16/2007 0936   CHOLHDL 8.2 CALC 10/16/2007 0936   VLDL 29 10/16/2007 0936   LDLCALC 136* 10/16/2007 0936    CBC    Component Value Date/Time   WBC 8.3 10/31/2013 1038   RBC 4.52 10/31/2013 1038   HGB 13.2 10/31/2013 1038   HCT 39.7 10/31/2013 1038   PLT 272.0 10/31/2013 1038   MCV 87.8 10/31/2013 1038   MCHC 33.2 10/31/2013 1038   RDW 14.6 10/31/2013 1038   LYMPHSABS 2.4 10/31/2013 1038   MONOABS 0.7 10/31/2013 1038   EOSABS 0.2 10/31/2013 1038   BASOSABS 0.0 10/31/2013 1038    Hgb A1C No results found for this basename: HGBA1C        Assessment &  Plan:   Low back pain with left side sciatica:  Reviewed the MRI and xray with the patient I do not see anything that warrents surgery She insists on a second opinion Will refer to Dr. Carloyn Manner for further evaluation Continue  tramadol at this time  RTC as needed

## 2014-01-14 NOTE — Progress Notes (Signed)
Pre visit review using our clinic review tool, if applicable. No additional management support is needed unless otherwise documented below in the visit note. 

## 2014-01-18 ENCOUNTER — Encounter: Payer: Self-pay | Admitting: Internal Medicine

## 2014-03-10 ENCOUNTER — Other Ambulatory Visit: Payer: Self-pay | Admitting: *Deleted

## 2014-03-10 MED ORDER — BUSPIRONE HCL 30 MG PO TABS: 30.0000 mg | ORAL_TABLET | Freq: Two times a day (BID) | ORAL | Status: DC

## 2014-03-31 ENCOUNTER — Telehealth: Payer: Self-pay

## 2014-03-31 NOTE — Telephone Encounter (Signed)
This needs to be in a 30 min slot somewhere, the 4:15 is a 15 minute slot

## 2014-03-31 NOTE — Telephone Encounter (Signed)
appt changed to 1:30 tomorrow

## 2014-03-31 NOTE — Telephone Encounter (Signed)
For several weeks pt has had rash on face and neck and pt thinks may be related to Buspar and Celexa; for 6 weeks pt has trouble going to sleep but when she does go to sleep she sleeps 10 -12 hours. Pt has started gaining weight and having anxiety attacks at least once a week. Pt scheduled appt to see Webb Silversmith NP 04/01/14 4:15pm; first available that fit pts schedule.pt will cb if symptoms change or worsen prior to appt.

## 2014-04-01 ENCOUNTER — Encounter: Payer: Self-pay | Admitting: Internal Medicine

## 2014-04-01 ENCOUNTER — Ambulatory Visit (INDEPENDENT_AMBULATORY_CARE_PROVIDER_SITE_OTHER): Payer: Commercial Managed Care - HMO | Admitting: Internal Medicine

## 2014-04-01 ENCOUNTER — Ambulatory Visit: Payer: Self-pay | Admitting: Internal Medicine

## 2014-04-01 VITALS — BP 134/80 | HR 74 | Temp 98.0°F | Wt 245.5 lb

## 2014-04-01 DIAGNOSIS — F410 Panic disorder [episodic paroxysmal anxiety] without agoraphobia: Secondary | ICD-10-CM

## 2014-04-01 DIAGNOSIS — F411 Generalized anxiety disorder: Secondary | ICD-10-CM

## 2014-04-01 MED ORDER — SERTRALINE HCL 50 MG PO TABS: 50.0000 mg | ORAL_TABLET | Freq: Every day | ORAL | Status: DC

## 2014-04-01 NOTE — Assessment & Plan Note (Signed)
She has already stopped her medication She reports that she does not want to try another SSRI I advised her that her other option would be let me refer her for psychotherapy- she declines and reports that she already has a therapist Advised her that if she does not want to try a SSRI or followed up her psychotherapy, then there was not much I could do for her She agrees to try zoloft at this time and follow up with PCP in 1 month

## 2014-04-01 NOTE — Patient Instructions (Signed)
Generalized Anxiety Disorder Generalized anxiety disorder (GAD) is a mental disorder. It interferes with life functions, including relationships, work, and school. GAD is different from normal anxiety, which everyone experiences at some point in their lives in response to specific life events and activities. Normal anxiety actually helps us prepare for and get through these life events and activities. Normal anxiety goes away after the event or activity is over.  GAD causes anxiety that is not necessarily related to specific events or activities. It also causes excess anxiety in proportion to specific events or activities. The anxiety associated with GAD is also difficult to control. GAD can vary from mild to severe. People with severe GAD can have intense waves of anxiety with physical symptoms (panic attacks).  SYMPTOMS The anxiety and worry associated with GAD are difficult to control. This anxiety and worry are related to many life events and activities and also occur more days than not for 6 months or longer. People with GAD also have three or more of the following symptoms (one or more in children):  Restlessness.   Fatigue.  Difficulty concentrating.   Irritability.  Muscle tension.  Difficulty sleeping or unsatisfying sleep. DIAGNOSIS GAD is diagnosed through an assessment by your health care provider. Your health care provider will ask you questions aboutyour mood,physical symptoms, and events in your life. Your health care provider may ask you about your medical history and use of alcohol or drugs, including prescription medicines. Your health care provider may also do a physical exam and blood tests. Certain medical conditions and the use of certain substances can cause symptoms similar to those associated with GAD. Your health care provider may refer you to a mental health specialist for further evaluation. TREATMENT The following therapies are usually used to treat GAD:    Medication. Antidepressant medication usually is prescribed for long-term daily control. Antianxiety medicines may be added in severe cases, especially when panic attacks occur.   Talk therapy (psychotherapy). Certain types of talk therapy can be helpful in treating GAD by providing support, education, and guidance. A form of talk therapy called cognitive behavioral therapy can teach you healthy ways to think about and react to daily life events and activities.  Stress managementtechniques. These include yoga, meditation, and exercise and can be very helpful when they are practiced regularly. A mental health specialist can help determine which treatment is best for you. Some people see improvement with one therapy. However, other people require a combination of therapies. Document Released: 08/06/2012 Document Revised: 08/26/2013 Document Reviewed: 08/06/2012 Exit[employer] Patient Information 2015 [employer]. This information is not intended to replace advice given to you by your health care provider. Make sure you discuss any questions you have with your health care provider.  

## 2014-04-01 NOTE — Progress Notes (Signed)
Pre visit review using our clinic review tool, if applicable. No additional management support is needed unless otherwise documented below in the visit note. 

## 2014-04-01 NOTE — Progress Notes (Signed)
Subjective:    Patient ID: Cassandra Mcconnell, female    DOB: 03/28/1966, 48 y.o.   MRN: [id number]  HPI  Pt presents to the clinic today with c/o mulitple complaints. She does not think the celexa and buspar are working for her. She reports worsening anxiety, mild depression and she is starting to have panic attacks. She has not been able to identify a trigger for her anxiety. She is not stressed. She also reports that she has been having nightmares while on the medications. She has has been having a persistent rash on her face. The rash did not itch or burn. She reports that she has stopped the celexa and buspar 2 days ago. The rash is now gone, she is no longer having nightmares, she is not depressed but the anxiety persists. She has tried and failed Wellbutrin and Prozac in the past. She denies SI/HI.  Review of Systems  Past Medical History  Diagnosis Date  . Anxiety   . GERD (gastroesophageal reflux disease)   . Hyperlipidemia   . Hypertension     Current Outpatient Prescriptions  Medication Sig Dispense Refill  . acetaminophen (TYLENOL) 500 MG tablet Take 1,000 mg by mouth every 6 (six) hours as needed. For pain     . ibuprofen (ADVIL,MOTRIN) 200 MG tablet Take 800 mg by mouth daily as needed. For cramps    . metoprolol succinate (TOPROL-XL) 25 MG 24 hr tablet Take 1 tablet (25 mg total) by mouth 2 (two) times daily. 180 tablet 1  . triamcinolone cream (KENALOG) 0.1 % Apply 1 application topically 2 (two) times daily. 30 g 0  . busPIRone (BUSPAR) 30 MG tablet Take 1 tablet (30 mg total) by mouth 2 (two) times daily. (Patient not taking: Reported on 04/01/2014) 180 tablet 0  . citalopram (CELEXA) 20 MG tablet TAKE 1 TABLET (20 MG TOTAL) BY MOUTH DAILY. (Patient not taking: Reported on 04/01/2014) 90 tablet 3  . traMADol (ULTRAM) 50 MG tablet Take 1 tablet (50 mg total) by mouth every 8 (eight) hours as needed. (Patient not taking: Reported on 04/01/2014) 30 tablet 0   No current  facility-administered medications for this visit.    Allergies  Allergen Reactions  . Penicillins Shortness Of Breath  . Azithromycin Swelling  . Cimetidine Other (See Comments)    unknown  . Conjugated Estrogens Other (See Comments)    emotional  . Cyclobenzaprine Hcl     REACTION: disorientation  . Dicyclomine Hcl Other (See Comments)    unknown  . Lisinopril     REACTION: RASH AND MOUTH SORE  . Paroxetine   . Rosuvastatin Dermatitis  . Sulfa Antibiotics Hives and Itching  . Wellbutrin [Bupropion Hcl] Other (See Comments)    Emotional, makes depression worse  . Ciprofloxacin Itching and Rash  . Doxycycline Hyclate Itching and Rash  . Erythromycin Base Itching and Rash  . Prednisone Rash    Family History  Problem Relation Age of Onset  . COPD Mother   . Hypertension Mother   . GER disease Mother   . Heart attack Father   . Cancer Father     prostate  . Hyperlipidemia Brother   . Hypertension Brother   . Heart attack Paternal Grandmother   . Heart attack Paternal Grandfather   . Stroke Paternal Grandfather   . Hyperlipidemia Brother   . Hypertension Brother     History   Social History  . Marital Status: Married    Spouse Name: N/A  Number of Children: 1  . Years of Education: N/A   Occupational History  . stay at home mom    Social History Main Topics  . Smoking status: Never Smoker   . Smokeless tobacco: Never Used  . Alcohol Use: No  . Drug Use: No  . Sexual Activity: Not on file   Other Topics Concern  . Not on file   Social History Narrative     Constitutional: Denies fever, malaise, fatigue, headache or abrupt weight changes.  Psych: Pt reports anxiety. Denies depression, SI/HI.  No other specific complaints in a complete review of systems (except as listed in HPI above).     Objective:   Physical Exam  BP 134/80 mmHg  Pulse 74  Temp(Src) 98 F (36.7 C) (Oral)  Wt 245 lb 8 oz (111.358 kg)  SpO2 98% Wt Readings from Last 3  Encounters:  04/01/14 245 lb 8 oz (111.358 kg)  01/14/14 246 lb (111.585 kg)  10/31/13 248 lb (112.492 kg)    General: Appears her stated age, obese in NAD. Skin: Warm, dry and intact. No rashes, lesions or ulcerations noted. Cardiovascular: Normal rate and rhythm. S1,S2 noted.  No murmur, rubs or gallops noted.  Pulmonary/Chest: Normal effort and positive vesicular breath sounds. No respiratory distress. No wheezes, rales or ronchi noted.  Psychiatric: Mood anxious appearing and affect normal. Behavior is normal. Judgment and thought content normal.     BMET    Component Value Date/Time   NA 136 10/31/2013 1038   K 3.9 10/31/2013 1038   CL 103 10/31/2013 1038   CO2 25 10/31/2013 1038   GLUCOSE 88 10/31/2013 1038   BUN 11 10/31/2013 1038   CREATININE 0.8 10/31/2013 1038   CALCIUM 9.6 10/31/2013 1038   GFRNONAA >60 08/02/2008 0444   GFRAA  08/02/2008 0444    >60        The eGFR has been calculated using the MDRD equation. This calculation has not been validated in all clinical situations. eGFR's persistently <60 mL/min signify possible Chronic Kidney Disease.    Lipid Panel     Component Value Date/Time   CHOL 188 10/16/2007 0936   TRIG 146 10/16/2007 0936   HDL 22.9* 10/16/2007 0936   CHOLHDL 8.2 CALC 10/16/2007 0936   VLDL 29 10/16/2007 0936   LDLCALC 136* 10/16/2007 0936    CBC    Component Value Date/Time   WBC 8.3 10/31/2013 1038   RBC 4.52 10/31/2013 1038   HGB 13.2 10/31/2013 1038   HCT 39.7 10/31/2013 1038   PLT 272.0 10/31/2013 1038   MCV 87.8 10/31/2013 1038   MCHC 33.2 10/31/2013 1038   RDW 14.6 10/31/2013 1038   LYMPHSABS 2.4 10/31/2013 1038   MONOABS 0.7 10/31/2013 1038   EOSABS 0.2 10/31/2013 1038   BASOSABS 0.0 10/31/2013 1038    Hgb A1C No results found for: HGBA1C       Assessment & Plan:

## 2014-05-05 ENCOUNTER — Telehealth: Payer: Self-pay | Admitting: Family Medicine

## 2014-05-05 ENCOUNTER — Ambulatory Visit: Payer: Self-pay | Admitting: Family Medicine

## 2014-05-05 NOTE — Telephone Encounter (Signed)
Patient did not come for their scheduled appointment today for follow up Please let me know if the patient needs to be contacted immediately for follow up or if no follow up is necessary.   ° °

## 2014-05-14 DIAGNOSIS — E669 Obesity, unspecified: Secondary | ICD-10-CM | POA: Diagnosis not present

## 2014-05-14 DIAGNOSIS — M47816 Spondylosis without myelopathy or radiculopathy, lumbar region: Secondary | ICD-10-CM | POA: Diagnosis not present

## 2014-05-15 ENCOUNTER — Other Ambulatory Visit: Payer: Self-pay | Admitting: Internal Medicine

## 2014-05-15 DIAGNOSIS — M549 Dorsalgia, unspecified: Secondary | ICD-10-CM

## 2014-05-15 DIAGNOSIS — N62 Hypertrophy of breast: Secondary | ICD-10-CM

## 2014-05-26 ENCOUNTER — Encounter: Payer: Self-pay | Admitting: Family Medicine

## 2014-05-26 ENCOUNTER — Ambulatory Visit (INDEPENDENT_AMBULATORY_CARE_PROVIDER_SITE_OTHER): Payer: Commercial Managed Care - HMO | Admitting: Family Medicine

## 2014-05-26 ENCOUNTER — Ambulatory Visit: Payer: Self-pay | Admitting: Family Medicine

## 2014-05-26 NOTE — Progress Notes (Signed)
Subjective:   Patient ID: Cassandra Mcconnell, female    DOB: [birth year], 49 y.o.   MRN: [id number]  Cassandra Mcconnell is a pleasant 49 y.o. year old female who presents to clinic today with Weight Loss  on 05/26/2014  HPI:  Obesity- BMI 41.21 Has been having more pain in her lower back.  Was told by neuro that she needed to have a breast reduction and lose weight.  Wants to talk to me about tx options today.  Has tried calorie counting, phentermine, weight watchers, "everything else."  The largest she has been is in the [place].  Feels now is the time to do something given her worsening back pain.   Wt Readings from Last 3 Encounters:  05/26/14 245 lb 12 oz (111.471 kg)  04/01/14 245 lb 8 oz (111.358 kg)  01/14/14 246 lb (111.585 kg)   Current Outpatient Prescriptions on File Prior to Visit  Medication Sig Dispense Refill  . acetaminophen (TYLENOL) 500 MG tablet Take 1,000 mg by mouth every 6 (six) hours as needed. For pain     . ibuprofen (ADVIL,MOTRIN) 200 MG tablet Take 800 mg by mouth daily as needed. For cramps    . traMADol (ULTRAM) 50 MG tablet Take 1 tablet (50 mg total) by mouth every 8 (eight) hours as needed. 30 tablet 0  . triamcinolone cream (KENALOG) 0.1 % Apply 1 application topically 2 (two) times daily. 30 g 0   No current facility-administered medications on file prior to visit.    Allergies  Allergen Reactions  . Penicillins Shortness Of Breath  . Azithromycin Swelling  . Cimetidine Other (See Comments)    unknown  . Conjugated Estrogens Other (See Comments)    emotional  . Cyclobenzaprine Hcl     REACTION: disorientation  . Dicyclomine Hcl Other (See Comments)    unknown  . Lisinopril     REACTION: RASH AND MOUTH SORE  . Paroxetine   . Rosuvastatin Dermatitis  . Sulfa Antibiotics Hives and Itching  . Wellbutrin [Bupropion Hcl] Other (See Comments)    Emotional, makes depression worse  . Ciprofloxacin Itching and Rash  . Doxycycline Hyclate Itching and  Rash  . Erythromycin Base Itching and Rash  . Prednisone Rash    Past Medical History  Diagnosis Date  . Anxiety   . GERD (gastroesophageal reflux disease)   . Hyperlipidemia   . Hypertension     Past Surgical History  Procedure Laterality Date  . Septoplasty  1988  . Tonsillectomy  1991  . Cholecystectomy      1998  . Back surgery  2004    l4/5 fusion    Family History  Problem Relation Age of Onset  . COPD Mother   . Hypertension Mother   . GER disease Mother   . Heart attack Father   . Cancer Father     prostate  . Hyperlipidemia Brother   . Hypertension Brother   . Heart attack Paternal Grandmother   . Heart attack Paternal Grandfather   . Stroke Paternal Grandfather   . Hyperlipidemia Brother   . Hypertension Brother     History   Social History  . Marital Status: Married    Spouse Name: N/A    Number of Children: 1  . Years of Education: N/A   Occupational History  . stay at home mom    Social History Main Topics  . Smoking status: Never Smoker   . Smokeless tobacco: Never Used  . Alcohol  Use: No  . Drug Use: No  . Sexual Activity: Not on file   Other Topics Concern  . Not on file   Social History Narrative   The PMH, PSH, Social History, Family History, Medications, and allergies have been reviewed in [facility], and have been updated if relevant.   Review of Systems  Eyes: Negative.   Respiratory: Negative.   Endocrine: Negative.   Musculoskeletal: Positive for back pain. Negative for gait problem.  Allergic/Immunologic: Negative.   Neurological: Negative.   Hematological: Negative.   Psychiatric/Behavioral: Negative.   All other systems reviewed and are negative.      Objective:    BP 136/74 mmHg  Pulse 87  Temp(Src) 98 F (36.7 C) (Oral)  Ht 5' 4.75" (1.645 m)  Wt 245 lb 12 oz (111.471 kg)  BMI 41.19 kg/m2  SpO2 98%  LMP 05/04/2014   Physical Exam  Constitutional: She appears well-developed and well-nourished. No  distress.  Eyes: Conjunctivae are normal.  Cardiovascular: Normal rate.   Pulmonary/Chest: Effort normal.  Skin: Skin is warm and dry.  Psychiatric: She has a normal mood and affect. Her behavior is normal. Judgment and thought content normal.  Nursing note reviewed.         Assessment & Plan:   Severe obesity (BMI >= 40) - Plan: Ambulatory referral to General Surgery No Follow-up on file.

## 2014-05-26 NOTE — Progress Notes (Signed)
Pre visit review using our clinic review tool, if applicable. No additional management support is needed unless otherwise documented below in the visit note. 

## 2014-05-26 NOTE — Assessment & Plan Note (Signed)
Discussed tx options. >15 minutes spent in face to face time with patient, >50% spent in counselling or coordination of care. Agrees to referral to bariatric surgery- [facility]- referral placed.

## 2014-06-11 ENCOUNTER — Other Ambulatory Visit: Payer: Self-pay | Admitting: Family Medicine

## 2014-06-11 ENCOUNTER — Other Ambulatory Visit: Payer: Self-pay | Admitting: *Deleted

## 2014-06-12 NOTE — Telephone Encounter (Signed)
Per [facility], pt is needing an appt. Pt is not continuously on Tramadol; last Rx 11/2013 so pt will need to be seen if needing refills.

## 2014-06-18 DIAGNOSIS — Z6841 Body Mass Index (BMI) 40.0 and over, adult: Secondary | ICD-10-CM | POA: Diagnosis not present

## 2014-06-18 DIAGNOSIS — M546 Pain in thoracic spine: Secondary | ICD-10-CM | POA: Diagnosis not present

## 2014-06-18 DIAGNOSIS — M542 Cervicalgia: Secondary | ICD-10-CM | POA: Diagnosis not present

## 2014-06-18 DIAGNOSIS — N62 Hypertrophy of breast: Secondary | ICD-10-CM | POA: Diagnosis not present

## 2014-06-19 ENCOUNTER — Telehealth: Payer: Self-pay

## 2014-06-19 DIAGNOSIS — Z1239 Encounter for other screening for malignant neoplasm of breast: Secondary | ICD-10-CM

## 2014-06-19 NOTE — Telephone Encounter (Signed)
Pt left v/m; pt was seen by plastic surgeon for breast reduction and pt was advised to have mammogram done since has not had mammogram since 2009. Pt request mammogram scheduled at [facility] on any Friday morning. Pt request cb.

## 2014-06-19 NOTE — Telephone Encounter (Signed)
Order entered

## 2014-06-20 ENCOUNTER — Other Ambulatory Visit: Payer: Self-pay | Admitting: Family Medicine

## 2014-06-20 ENCOUNTER — Encounter: Payer: Self-pay | Admitting: Family Medicine

## 2014-06-20 DIAGNOSIS — Z1231 Encounter for screening mammogram for malignant neoplasm of breast: Secondary | ICD-10-CM

## 2014-06-20 NOTE — Telephone Encounter (Signed)
Thank you, Linda.  

## 2014-06-20 NOTE — Telephone Encounter (Signed)
Called pt and scheduled Mammogram appointment for her at [facility] for 06/27/14 11:10 am arrival.  Explained to pt that she is now able to schedule screening MM herself, but as a courtesy to her I had completed today.  / lt

## 2014-06-23 ENCOUNTER — Encounter: Payer: Self-pay | Admitting: Family Medicine

## 2014-06-23 ENCOUNTER — Other Ambulatory Visit: Payer: Self-pay | Admitting: Family Medicine

## 2014-06-23 ENCOUNTER — Ambulatory Visit (INDEPENDENT_AMBULATORY_CARE_PROVIDER_SITE_OTHER): Payer: Commercial Managed Care - HMO | Admitting: Family Medicine

## 2014-06-23 DIAGNOSIS — F411 Generalized anxiety disorder: Secondary | ICD-10-CM | POA: Diagnosis not present

## 2014-06-23 DIAGNOSIS — M546 Pain in thoracic spine: Secondary | ICD-10-CM

## 2014-06-23 MED ORDER — METOPROLOL TARTRATE 25 MG PO TABS: 25.0000 mg | ORAL_TABLET | Freq: Two times a day (BID) | ORAL | Status: DC

## 2014-06-23 MED ORDER — CITALOPRAM HYDROBROMIDE 20 MG PO TABS: 20.0000 mg | ORAL_TABLET | Freq: Every day | ORAL | Status: DC

## 2014-06-23 NOTE — Assessment & Plan Note (Signed)
Deteriorated- situational. Celexa refilled. Call or return to clinic prn if these symptoms worsen or fail to improve as anticipated.

## 2014-06-23 NOTE — Patient Instructions (Signed)
Good to see you. Please come see me for your 1 month follow up.

## 2014-06-23 NOTE — Assessment & Plan Note (Signed)
Improved. Encouraged her to continue cutting back on calories and trying to increase physical activity as tolerated, due to her back pain. Follow up in 1 month. The patient indicates understanding of these issues and agrees with the plan.

## 2014-06-23 NOTE — Progress Notes (Signed)
Subjective:   Patient ID: Cassandra Mcconnell, female    DOB: [birth year], 49 y.o.   MRN: [id number]  Cassandra Mcconnell is a pleasant 49 y.o. year old female who presents to clinic today with Follow-up  on 06/23/2014  HPI:  Saw her earlier this month for obesity.  Had been having more pain in her lower back.  Was told by neurosurgery (Dr. Carloyn Manner)  that she needed to have a breast reduction and lose weight.  Has tried calorie counting, phentermine, weight watchers, "everything else."  The largest she has been is in the [place].  Referred her to [facility] to discuss bariatric surgical options which she did pursue.  Watching the webnar that is required currently.  She needs to show weight loss which she has already done- has lost 7 pounds in 6 weeks according to her scale.  She has been counting calories.     Wt Readings from Last 3 Encounters:  06/23/14 242 lb 8 oz (109.997 kg)  05/26/14 245 lb 12 oz (111.471 kg)  04/01/14 245 lb 8 oz (111.358 kg)   She did restart her celexa recently and asks for refills.  Her son with severe behavioral problems was discharged from his facility and she is looking for new placement for her.  This makes her anxious but she does not feel depressed.  Sleeping ok.  Appetite good. Current Outpatient Prescriptions on File Prior to Visit  Medication Sig Dispense Refill  . acetaminophen (TYLENOL) 500 MG tablet Take 1,000 mg by mouth every 6 (six) hours as needed. For pain     . ibuprofen (ADVIL,MOTRIN) 200 MG tablet Take 800 mg by mouth daily as needed. For cramps    . traMADol (ULTRAM) 50 MG tablet Take 1 tablet (50 mg total) by mouth every 8 (eight) hours as needed. 30 tablet 0  . triamcinolone cream (KENALOG) 0.1 % Apply 1 application topically 2 (two) times daily. 30 g 0   No current facility-administered medications on file prior to visit.    Allergies  Allergen Reactions  . Penicillins Shortness Of Breath  . Azithromycin Swelling  . Cimetidine Other (See Comments)      unknown  . Conjugated Estrogens Other (See Comments)    emotional  . Cyclobenzaprine Hcl     REACTION: disorientation  . Dicyclomine Hcl Other (See Comments)    unknown  . Lisinopril     REACTION: RASH AND MOUTH SORE  . Paroxetine   . Rosuvastatin Dermatitis  . Sulfa Antibiotics Hives and Itching  . Wellbutrin [Bupropion Hcl] Other (See Comments)    Emotional, makes depression worse  . Ciprofloxacin Itching and Rash  . Doxycycline Hyclate Itching and Rash  . Erythromycin Base Itching and Rash  . Prednisone Rash    Past Medical History  Diagnosis Date  . Anxiety   . GERD (gastroesophageal reflux disease)   . Hyperlipidemia   . Hypertension     Past Surgical History  Procedure Laterality Date  . Septoplasty  1988  . Tonsillectomy  1991  . Cholecystectomy      1998  . Back surgery  2004    l4/5 fusion    Family History  Problem Relation Age of Onset  . COPD Mother   . Hypertension Mother   . GER disease Mother   . Heart attack Father   . Cancer Father     prostate  . Hyperlipidemia Brother   . Hypertension Brother   . Heart attack Paternal Grandmother   .  Heart attack Paternal Grandfather   . Stroke Paternal Grandfather   . Hyperlipidemia Brother   . Hypertension Brother     History   Social History  . Marital Status: Married    Spouse Name: N/A  . Number of Children: 1  . Years of Education: N/A   Occupational History  . stay at home mom    Social History Main Topics  . Smoking status: Never Smoker   . Smokeless tobacco: Never Used  . Alcohol Use: No  . Drug Use: No  . Sexual Activity: Not on file   Other Topics Concern  . Not on file   Social History Narrative   The PMH, PSH, Social History, Family History, Medications, and allergies have been reviewed in [facility], and have been updated if relevant.   Review of Systems  Eyes: Negative.   Respiratory: Negative.   Endocrine: Negative.   Musculoskeletal: Positive for back pain. Negative  for gait problem.  Allergic/Immunologic: Negative.   Neurological: Negative.   Hematological: Negative.   Psychiatric/Behavioral: Negative.   All other systems reviewed and are negative.      Objective:    BP 116/76 mmHg  Pulse 70  Temp(Src) 98 F (36.7 C) (Oral)  Wt 242 lb 8 oz (109.997 kg)  SpO2 98%  LMP 05/30/2014   Physical Exam  Constitutional: She appears well-developed and well-nourished. No distress.  Eyes: Conjunctivae are normal.  Cardiovascular: Normal rate.   Pulmonary/Chest: Effort normal.  Skin: Skin is warm and dry.  Psychiatric: She has a normal mood and affect. Her behavior is normal. Judgment and thought content normal.  Nursing note reviewed.         Assessment & Plan:   Severe obesity (BMI >= 40) No Follow-up on file.

## 2014-06-23 NOTE — Assessment & Plan Note (Signed)
Requests a letter to be sent to her surgeon stating that she would benefit from breast reduction surgery due to her debilitating back pain. Dr. Carloyn Manner has already written one as well, per pt.

## 2014-06-23 NOTE — Progress Notes (Signed)
Pre visit review using our clinic review tool, if applicable. No additional management support is needed unless otherwise documented below in the visit note. 

## 2014-06-24 MED ORDER — TRAMADOL HCL 50 MG PO TABS: 50.0000 mg | ORAL_TABLET | Freq: Three times a day (TID) | ORAL | Status: DC | PRN

## 2014-06-24 NOTE — Telephone Encounter (Signed)
Pt advised Dr Deborra Medina will fill this month, but pt will need to obtain all additional refills from the surgeon.

## 2014-06-26 ENCOUNTER — Encounter: Payer: Self-pay | Admitting: Family Medicine

## 2014-06-27 ENCOUNTER — Ambulatory Visit (HOSPITAL_COMMUNITY): Payer: Self-pay

## 2014-06-27 NOTE — Telephone Encounter (Signed)
i faxed this to them the day that she had an OV.

## 2014-06-30 ENCOUNTER — Ambulatory Visit (HOSPITAL_COMMUNITY): Payer: Self-pay

## 2014-07-01 ENCOUNTER — Encounter: Payer: Self-pay | Admitting: Family Medicine

## 2014-07-02 ENCOUNTER — Inpatient Hospital Stay (HOSPITAL_COMMUNITY): Admission: RE | Admit: 2014-07-02 | Payer: Self-pay | Source: Ambulatory Visit

## 2014-07-11 ENCOUNTER — Ambulatory Visit (HOSPITAL_COMMUNITY)
Admission: RE | Admit: 2014-07-11 | Discharge: 2014-07-11 | Disposition: A | Discharge status: Home / Self Care | Payer: Commercial Managed Care - HMO | Source: Ambulatory Visit | Attending: Family Medicine | Admitting: Family Medicine

## 2014-07-11 DIAGNOSIS — Z1239 Encounter for other screening for malignant neoplasm of breast: Secondary | ICD-10-CM

## 2014-07-11 DIAGNOSIS — Z1231 Encounter for screening mammogram for malignant neoplasm of breast: Secondary | ICD-10-CM | POA: Diagnosis not present

## 2014-07-14 ENCOUNTER — Encounter: Payer: Self-pay | Admitting: Family Medicine

## 2014-07-23 ENCOUNTER — Encounter: Payer: Self-pay | Admitting: Family Medicine

## 2014-07-23 ENCOUNTER — Ambulatory Visit (INDEPENDENT_AMBULATORY_CARE_PROVIDER_SITE_OTHER): Payer: Commercial Managed Care - HMO | Admitting: Family Medicine

## 2014-07-23 DIAGNOSIS — M546 Pain in thoracic spine: Secondary | ICD-10-CM

## 2014-07-23 DIAGNOSIS — F411 Generalized anxiety disorder: Secondary | ICD-10-CM

## 2014-07-23 MED ORDER — TRAMADOL HCL 50 MG PO TABS: 50.0000 mg | ORAL_TABLET | Freq: Three times a day (TID) | ORAL | Status: DC | PRN

## 2014-07-23 MED ORDER — CITALOPRAM HYDROBROMIDE 20 MG PO TABS: ORAL_TABLET | ORAL | Status: DC

## 2014-07-23 NOTE — Progress Notes (Signed)
Pre visit review using our clinic review tool, if applicable. No additional management support is needed unless otherwise documented below in the visit note. 

## 2014-07-23 NOTE — Patient Instructions (Signed)
Good to see you. Keep up the good work! Come see me in a month.  We are increasing your celexa to 20 mg twice daily.

## 2014-07-23 NOTE — Assessment & Plan Note (Signed)
With monthly follow ups with bariatric surgery being ultimate tool to help with her weight loss. Forms and letter completed.  Will fax to Mina. The patient indicates understanding of these issues and agrees with the plan.

## 2014-07-23 NOTE — Assessment & Plan Note (Signed)
Deteriorated. Increased celexa dose is appropriate- eRx sent for Celexa 20 mg twice daily. She is following up in 1 month.

## 2014-07-23 NOTE — Progress Notes (Signed)
Subjective:   Patient ID: Cassandra Mcconnell, female    DOB: 09-Aug-1965, 49 y.o.   MRN: [id number]  JASKIRAN PATA is a pleasant 49 y.o. year old female who presents to clinic today with Follow-up  on 07/23/2014  HPI:  I have been following her monthly for obesity.  She has to show that she is working on weight loss- already followed by [facility] for consideration of bariatric surgery.  Had been having more pain in her lower back. Was told by neurosurgery (Dr. Carloyn Manner) that she needed to have a breast reduction and lose weight.  Has tried calorie counting, phentermine, weight watchers, "everything else."  Has been cutting back on calories and walking more- weight on home scale went down to 139 but "then I got into the [employer]."  Wt Readings from Last 3 Encounters:  07/23/14 242 lb 8 oz (109.997 kg)  06/23/14 242 lb 8 oz (109.997 kg)  05/26/14 245 lb 12 oz (111.471 kg)  Has form for me to fill out a letter I need to write for medical necessity to [facility].  Anxiety- celexa 20 mg daily had been working well.  Her foster son is home with multiple behavioral issues and she feels by the end of the day, she more anxious.  Denies feeling depressed. Sleeping ok.  No SI or HI.  Back pain- Dr. Carloyn Manner will not yet schedule her surgery and per pt, she needs me to prescribe tramadol until her surgeries are scheduled. Current Outpatient Prescriptions on File Prior to Visit  Medication Sig Dispense Refill  . acetaminophen (TYLENOL) 500 MG tablet Take 1,000 mg by mouth every 6 (six) hours as needed. For pain     . ibuprofen (ADVIL,MOTRIN) 200 MG tablet Take 800 mg by mouth daily as needed. For cramps    . metoprolol tartrate (LOPRESSOR) 25 MG tablet Take 1 tablet (25 mg total) by mouth 2 (two) times daily. 60 tablet 6  . triamcinolone cream (KENALOG) 0.1 % Apply 1 application topically 2 (two) times daily. 30 g 0   No current facility-administered medications on file prior to visit.    Allergies  Allergen  Reactions  . Penicillins Shortness Of Breath  . Azithromycin Swelling  . Cimetidine Other (See Comments)    unknown  . Conjugated Estrogens Other (See Comments)    emotional  . Cyclobenzaprine Hcl     REACTION: disorientation  . Dicyclomine Hcl Other (See Comments)    unknown  . Lisinopril     REACTION: RASH AND MOUTH SORE  . Paroxetine   . Rosuvastatin Dermatitis  . Sulfa Antibiotics Hives and Itching  . Wellbutrin [Bupropion Hcl] Other (See Comments)    Emotional, makes depression worse  . Ciprofloxacin Itching and Rash  . Doxycycline Hyclate Itching and Rash  . Erythromycin Base Itching and Rash  . Prednisone Rash    Past Medical History  Diagnosis Date  . Anxiety   . GERD (gastroesophageal reflux disease)   . Hyperlipidemia   . Hypertension     Past Surgical History  Procedure Laterality Date  . Septoplasty  1988  . Tonsillectomy  1991  . Cholecystectomy      1998  . Back surgery  2004    l4/5 fusion    Family History  Problem Relation Age of Onset  . COPD Mother   . Hypertension Mother   . GER disease Mother   . Heart attack Father   . Cancer Father     prostate  .  Hyperlipidemia Brother   . Hypertension Brother   . Heart attack Paternal Grandmother   . Heart attack Paternal Grandfather   . Stroke Paternal Grandfather   . Hyperlipidemia Brother   . Hypertension Brother     History   Social History  . Marital Status: Married    Spouse Name: N/A  . Number of Children: 1  . Years of Education: N/A   Occupational History  . stay at home mom    Social History Main Topics  . Smoking status: Never Smoker   . Smokeless tobacco: Never Used  . Alcohol Use: No  . Drug Use: No  . Sexual Activity: Not on file   Other Topics Concern  . Not on file   Social History Narrative   The PMH, PSH, Social History, Family History, Medications, and allergies have been reviewed in [facility], and have been updated if relevant.   Review of Systems    Constitutional: Negative.   Musculoskeletal: Positive for back pain.  Skin: Negative.   Neurological: Negative.   Psychiatric/Behavioral: Negative for suicidal ideas, hallucinations, behavioral problems, confusion, sleep disturbance, self-injury, dysphoric mood, decreased concentration and agitation. The patient is nervous/anxious. The patient is not hyperactive.   All other systems reviewed and are negative.      Objective:    BP 118/68 mmHg  Pulse 65  Temp(Src) 97.4 F (36.3 C) (Oral)  Wt 242 lb 8 oz (109.997 kg)  SpO2 99%  LMP 07/11/2014   Physical Exam  Constitutional: She is oriented to person, place, and time. She appears well-developed and well-nourished. No distress.  HENT:  Head: Normocephalic.  Eyes: Conjunctivae are normal.  Cardiovascular: Normal rate.   Pulmonary/Chest: Effort normal.  Musculoskeletal: Normal range of motion.  Neurological: She is alert and oriented to person, place, and time. No cranial nerve deficit.  Skin: Skin is warm and dry.  Psychiatric: She has a normal mood and affect. Her behavior is normal. Judgment and thought content normal.  Nursing note and vitals reviewed.         Assessment & Plan:   Severe obesity (BMI >= 40)  Pain in thoracic spine  Anxiety state No Follow-up on file.

## 2014-07-23 NOTE — Assessment & Plan Note (Signed)
Persistent.  Has been compliant with pain contract. Reasonable to refill for now but I do not manage chronic pain and will not continue this for more than a few months. The patient indicates understanding of these issues and agrees with the plan.

## 2014-07-30 DIAGNOSIS — N62 Hypertrophy of breast: Secondary | ICD-10-CM | POA: Diagnosis not present

## 2014-07-30 DIAGNOSIS — M546 Pain in thoracic spine: Secondary | ICD-10-CM | POA: Diagnosis not present

## 2014-07-30 DIAGNOSIS — M542 Cervicalgia: Secondary | ICD-10-CM | POA: Diagnosis not present

## 2014-07-30 DIAGNOSIS — Z6841 Body Mass Index (BMI) 40.0 and over, adult: Secondary | ICD-10-CM | POA: Diagnosis not present

## 2014-08-01 NOTE — H&P (Signed)
  Subjective:    Patient ID: Cassandra Mcconnell is a 49 y.o. female.  HPI  Pt with chronic back and neck pain that has been present for over 7 years with plan for breast reduction. 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 Her complaints are of upper back back for which she has been on Tramadol chronically. She has completed PT for this and seen chiropracter, last 2004. She most recently consulted with Dr. Carloyn Manner of Neurosurgery who felt her back pain related to macromastia and would benefit from breast reduction. She is current 46 H, Notes 40 lb weight gain over 2 years but 8 lb loss over last month after starting wt management program. Considering bariatric surgery. Goal wt loss 100 lb.  Has rashes beneath breast monthly that she treats with nystatin. Unable to stand for prolonged time due to back pain. States she has to sit to do dishes, needs scooter for grocery shopping.   Last MMG 06/2014 negative with breast density B.No family hx breast cancer.   Review of Systems     Objective:    Physical Exam  Cardiovascular: Normal rate, regular rhythm and normal heart sounds.  Pulmonary/Chest: Effort normal and breath sounds normal.  Abdominal: Soft.   Breasts: without masses, grade 2 ptosis bilat R> L volume SN to nipple R 36 L 36 cm BW R23 L 22 cm Nipple to IMF R 20 L 18 cm +shoulder grooving    Assessment:      Macromastia, neck pain, back pain , obesity    Plan:      Thoracic back and neck pain chronic unrelieved with PT, chiropracter and pain meds. Reviewed breast reduction surgery with anchor type scar, overnight stay, drains.Reviewed post operative visits and limitations, recovery, compression bra. Reviewed risks including but not limited to bleeding, hematoma, seroma, diminished sensation nipple and breast skin, risk of nipple loss, wound healing problems, asymmetry, incidental carcinoma, changes with wt gain/loss, aging,  unacceptable cosmetic appearance reviewed, DVT/PE, cardiopulmonary complications, fat necrosis, changes to cancer surveillance. Questions answered. Hold ASA, NSAIDs for today onward. Reports Norco effective.  Anticipate 819 g reduction from each breast.   Irene Limbo, MD [facility] Plastic & Reconstructive Surgery [phone]

## 2014-08-05 ENCOUNTER — Encounter (HOSPITAL_BASED_OUTPATIENT_CLINIC_OR_DEPARTMENT_OTHER): Payer: Self-pay | Admitting: *Deleted

## 2014-08-05 NOTE — Progress Notes (Signed)
To go to [facility] for [facility]-to bring all meds, overnight bag in case she has to stay overnight

## 2014-08-06 ENCOUNTER — Encounter (HOSPITAL_COMMUNITY)
Admission: RE | Admit: 2014-08-06 | Discharge: 2014-08-06 | Disposition: A | Discharge status: Home / Self Care | Payer: Commercial Managed Care - HMO | Source: Ambulatory Visit | Attending: Plastic Surgery | Admitting: Plastic Surgery

## 2014-08-06 DIAGNOSIS — E669 Obesity, unspecified: Secondary | ICD-10-CM | POA: Diagnosis not present

## 2014-08-06 DIAGNOSIS — N62 Hypertrophy of breast: Secondary | ICD-10-CM | POA: Diagnosis not present

## 2014-08-06 LAB — BASIC METABOLIC PANEL
ANION GAP: 8 (ref 5–15)
BUN: 15 mg/dL (ref 6–23)
CO2: 22 mmol/L (ref 19–32)
CREATININE: 0.72 mg/dL (ref 0.50–1.10)
Calcium: 9.6 mg/dL (ref 8.4–10.5)
Chloride: 106 mmol/L (ref 96–112)
GFR calc non Af Amer: >90 mL/min (ref >90)
Glucose, Bld: 87 mg/dL (ref 70–99)
POTASSIUM: 4.5 mmol/L (ref 3.5–5.1)
Sodium: 136 mmol/L (ref 135–145)

## 2014-08-08 ENCOUNTER — Encounter (HOSPITAL_BASED_OUTPATIENT_CLINIC_OR_DEPARTMENT_OTHER): Payer: Self-pay | Admitting: *Deleted

## 2014-08-08 ENCOUNTER — Ambulatory Visit (HOSPITAL_BASED_OUTPATIENT_CLINIC_OR_DEPARTMENT_OTHER): Payer: Commercial Managed Care - HMO | Admitting: Anesthesiology

## 2014-08-08 ENCOUNTER — Ambulatory Visit (HOSPITAL_BASED_OUTPATIENT_CLINIC_OR_DEPARTMENT_OTHER)
Admission: RE | Admit: 2014-08-08 | Discharge: 2014-08-08 | Disposition: A | Discharge status: Home / Self Care | Payer: Commercial Managed Care - HMO | Source: Ambulatory Visit | Attending: Plastic Surgery | Admitting: Plastic Surgery

## 2014-08-08 ENCOUNTER — Encounter (HOSPITAL_BASED_OUTPATIENT_CLINIC_OR_DEPARTMENT_OTHER)
Admission: RE | Disposition: A | Discharge status: Home / Self Care | Payer: Self-pay | Source: Ambulatory Visit | Attending: Plastic Surgery

## 2014-08-08 DIAGNOSIS — E669 Obesity, unspecified: Secondary | ICD-10-CM | POA: Insufficient documentation

## 2014-08-08 DIAGNOSIS — M549 Dorsalgia, unspecified: Secondary | ICD-10-CM | POA: Diagnosis not present

## 2014-08-08 DIAGNOSIS — N62 Hypertrophy of breast: Secondary | ICD-10-CM | POA: Diagnosis not present

## 2014-08-08 DIAGNOSIS — M542 Cervicalgia: Secondary | ICD-10-CM | POA: Diagnosis not present

## 2014-08-08 DIAGNOSIS — N6012 Diffuse cystic mastopathy of left breast: Secondary | ICD-10-CM | POA: Diagnosis not present

## 2014-08-08 DIAGNOSIS — N6011 Diffuse cystic mastopathy of right breast: Secondary | ICD-10-CM | POA: Diagnosis not present

## 2014-08-08 HISTORY — DX: Depression, unspecified: F32.A

## 2014-08-08 HISTORY — DX: Dorsalgia, unspecified: M54.9

## 2014-08-08 HISTORY — DX: Major depressive disorder, single episode, unspecified: F32.9

## 2014-08-08 HISTORY — DX: Presence of spectacles and contact lenses: Z97.3

## 2014-08-08 HISTORY — PX: BREAST REDUCTION SURGERY: SHX8

## 2014-08-08 SURGERY — MAMMOPLASTY, REDUCTION
Anesthesia: General | Site: Breast | Laterality: Bilateral

## 2014-08-08 MED ORDER — VANCOMYCIN HCL IN DEXTROSE 1-5 GM/200ML-% IV SOLN
INTRAVENOUS | Status: AC
  Filled 2014-08-08: qty 200

## 2014-08-08 MED ORDER — FENTANYL CITRATE (PF) 100 MCG/2ML IJ SOLN
INTRAMUSCULAR | Status: DC | PRN
  Administered 2014-08-08 (×8): 50 ug via INTRAVENOUS

## 2014-08-08 MED ORDER — ESMOLOL HCL 10 MG/ML IV SOLN
INTRAVENOUS | Status: DC | PRN
  Administered 2014-08-08 (×2): 10 mg via INTRAVENOUS

## 2014-08-08 MED ORDER — SCOPOLAMINE 1 MG/3DAYS TD PT72
MEDICATED_PATCH | TRANSDERMAL | Status: DC | PRN
  Administered 2014-08-08: 1.5 mg via TRANSDERMAL

## 2014-08-08 MED ORDER — GLYCOPYRROLATE 0.2 MG/ML IJ SOLN
INTRAMUSCULAR | Status: DC | PRN
  Administered 2014-08-08 (×2): 0.2 mg via INTRAVENOUS

## 2014-08-08 MED ORDER — LIDOCAINE HCL (CARDIAC) 20 MG/ML IV SOLN
INTRAVENOUS | Status: DC | PRN
  Administered 2014-08-08: 50 mg via INTRAVENOUS

## 2014-08-08 MED ORDER — FENTANYL CITRATE 0.05 MG/ML IJ SOLN: 50.0000 ug | INTRAMUSCULAR | Status: DC | PRN

## 2014-08-08 MED ORDER — OXYCODONE HCL 5 MG/5ML PO SOLN: 5.0000 mg | Freq: Once | ORAL | Status: AC | PRN

## 2014-08-08 MED ORDER — OXYCODONE HCL 5 MG PO TABS
5.0000 mg | ORAL_TABLET | Freq: Once | ORAL | Status: AC | PRN
  Administered 2014-08-08: 5 mg via ORAL

## 2014-08-08 MED ORDER — LACTATED RINGERS IV SOLN
INTRAVENOUS | Status: DC
  Administered 2014-08-08 (×2): via INTRAVENOUS

## 2014-08-08 MED ORDER — PROPOFOL 10 MG/ML IV BOLUS
INTRAVENOUS | Status: DC | PRN
  Administered 2014-08-08: 200 mg via INTRAVENOUS

## 2014-08-08 MED ORDER — VANCOMYCIN HCL 10 G IV SOLR
1500.0000 mg | INTRAVENOUS | Status: AC
  Administered 2014-08-08: 1000 mg via INTRAVENOUS
  Administered 2014-08-08: 500 mg via INTRAVENOUS

## 2014-08-08 MED ORDER — VANCOMYCIN HCL IN DEXTROSE 500-5 MG/100ML-% IV SOLN
INTRAVENOUS | Status: AC
  Filled 2014-08-08: qty 100

## 2014-08-08 MED ORDER — MIDAZOLAM HCL 5 MG/5ML IJ SOLN
INTRAMUSCULAR | Status: DC | PRN
  Administered 2014-08-08: 2 mg via INTRAVENOUS

## 2014-08-08 MED ORDER — PHENYLEPHRINE HCL 10 MG/ML IJ SOLN
INTRAMUSCULAR | Status: DC | PRN
  Administered 2014-08-08: 80 ug via INTRAVENOUS

## 2014-08-08 MED ORDER — OXYCODONE HCL 5 MG PO TABS
ORAL_TABLET | ORAL | Status: AC
  Filled 2014-08-08: qty 1

## 2014-08-08 MED ORDER — SCOPOLAMINE 1 MG/3DAYS TD PT72
1.0000 | MEDICATED_PATCH | TRANSDERMAL | Status: DC
  Administered 2014-08-08: 1.5 mg via TRANSDERMAL

## 2014-08-08 MED ORDER — MIDAZOLAM HCL 2 MG/2ML IJ SOLN: 1.0000 mg | INTRAMUSCULAR | Status: DC | PRN

## 2014-08-08 MED ORDER — CHLORHEXIDINE GLUCONATE 4 % EX LIQD: 1.0000 "application " | Freq: Once | CUTANEOUS | Status: DC

## 2014-08-08 MED ORDER — MIDAZOLAM HCL 2 MG/2ML IJ SOLN
INTRAMUSCULAR | Status: AC
  Filled 2014-08-08: qty 2

## 2014-08-08 MED ORDER — LIDOCAINE-EPINEPHRINE 1 %-1:100000 IJ SOLN
INTRAMUSCULAR | Status: AC
  Filled 2014-08-08: qty 1

## 2014-08-08 MED ORDER — HYDROMORPHONE HCL 1 MG/ML IJ SOLN
INTRAMUSCULAR | Status: AC
  Filled 2014-08-08: qty 1

## 2014-08-08 MED ORDER — ONDANSETRON HCL 4 MG/2ML IJ SOLN
INTRAMUSCULAR | Status: DC | PRN
  Administered 2014-08-08: 4 mg via INTRAVENOUS

## 2014-08-08 MED ORDER — DEXAMETHASONE SODIUM PHOSPHATE 4 MG/ML IJ SOLN: INTRAMUSCULAR | Status: DC | PRN

## 2014-08-08 MED ORDER — SUCCINYLCHOLINE CHLORIDE 20 MG/ML IJ SOLN
INTRAMUSCULAR | Status: DC | PRN
  Administered 2014-08-08: 50 mg via INTRAVENOUS

## 2014-08-08 MED ORDER — BUPIVACAINE-EPINEPHRINE (PF) 0.25% -1:200000 IJ SOLN
INTRAMUSCULAR | Status: AC
  Filled 2014-08-08: qty 30

## 2014-08-08 MED ORDER — HYDROCODONE-ACETAMINOPHEN 5-325 MG PO TABS: 1.0000 | ORAL_TABLET | ORAL | Status: DC | PRN

## 2014-08-08 MED ORDER — FENTANYL CITRATE (PF) 100 MCG/2ML IJ SOLN
INTRAMUSCULAR | Status: AC
  Filled 2014-08-08: qty 8

## 2014-08-08 MED ORDER — PROMETHAZINE HCL 25 MG/ML IJ SOLN: 6.2500 mg | INTRAMUSCULAR | Status: DC | PRN

## 2014-08-08 MED ORDER — HYDROMORPHONE HCL 1 MG/ML IJ SOLN
0.2500 mg | INTRAMUSCULAR | Status: DC | PRN
  Administered 2014-08-08 (×2): 0.5 mg via INTRAVENOUS

## 2014-08-08 SURGICAL SUPPLY — 52 items
BINDER BREAST LRG (GAUZE/BANDAGES/DRESSINGS) IMPLANT
BINDER BREAST MEDIUM (GAUZE/BANDAGES/DRESSINGS) IMPLANT
BINDER BREAST XLRG (GAUZE/BANDAGES/DRESSINGS) IMPLANT
BINDER BREAST XXLRG (GAUZE/BANDAGES/DRESSINGS) ×2 IMPLANT
BLADE SURG 10 STRL SS (BLADE) ×10 IMPLANT
BNDG GAUZE ELAST 4 BULKY (GAUZE/BANDAGES/DRESSINGS) ×4 IMPLANT
CANISTER SUCT 1200ML W/VALVE (MISCELLANEOUS) ×3 IMPLANT
CHLORAPREP W/TINT 26ML (MISCELLANEOUS) ×5 IMPLANT
COVER BACK TABLE 60X90IN (DRAPES) ×3 IMPLANT
COVER MAYO STAND STRL (DRAPES) ×3 IMPLANT
COVER SURGICAL LIGHT HANDLE (MISCELLANEOUS) ×2 IMPLANT
DECANTER SPIKE VIAL GLASS SM (MISCELLANEOUS) IMPLANT
DRAIN CHANNEL 15F RND FF W/TCR (WOUND CARE) ×6 IMPLANT
DRAPE LAPAROSCOPIC ABDOMINAL (DRAPES) ×3 IMPLANT
DRSG PAD ABDOMINAL 8X10 ST (GAUZE/BANDAGES/DRESSINGS) ×10 IMPLANT
ELECT COATED BLADE 2.86 ST (ELECTRODE) ×3 IMPLANT
ELECT REM PT RETURN 9FT ADLT (ELECTROSURGICAL) ×3
ELECTRODE REM PT RTRN 9FT ADLT (ELECTROSURGICAL) ×1 IMPLANT
EVACUATOR SILICONE 100CC (DRAIN) ×4 IMPLANT
GLOVE BIO SURGEON STRL SZ 6 (GLOVE) ×7 IMPLANT
GLOVE BIO SURGEON STRL SZ 6.5 (GLOVE) IMPLANT
GLOVE BIO SURGEON STRL SZ7 (GLOVE) ×2 IMPLANT
GLOVE BIO SURGEONS STRL SZ 6.5 (GLOVE)
GLOVE BIOGEL PI IND STRL 7.0 (GLOVE) IMPLANT
GLOVE BIOGEL PI IND STRL 7.5 (GLOVE) IMPLANT
GLOVE BIOGEL PI INDICATOR 7.0 (GLOVE) ×4
GLOVE BIOGEL PI INDICATOR 7.5 (GLOVE) ×4
GLOVE ECLIPSE 6.5 STRL STRAW (GLOVE) ×8 IMPLANT
GLOVE SURG SS PI 7.0 STRL IVOR (GLOVE) ×6 IMPLANT
GOWN STRL REUS W/ TWL LRG LVL3 (GOWN DISPOSABLE) ×2 IMPLANT
GOWN STRL REUS W/TWL LRG LVL3 (GOWN DISPOSABLE) ×15
LIQUID BAND (GAUZE/BANDAGES/DRESSINGS) ×9 IMPLANT
MARKER SKIN DUAL TIP RULER LAB (MISCELLANEOUS) ×2 IMPLANT
NS IRRIG 1000ML POUR BTL (IV SOLUTION) ×3 IMPLANT
PACK BASIN DAY SURGERY FS (CUSTOM PROCEDURE TRAY) ×3 IMPLANT
PENCIL BUTTON HOLSTER BLD 10FT (ELECTRODE) ×3 IMPLANT
PIN SAFETY STERILE (MISCELLANEOUS) ×4 IMPLANT
SHEET MEDIUM DRAPE 40X70 STRL (DRAPES) ×6 IMPLANT
SLEEVE SCD COMPRESS KNEE MED (MISCELLANEOUS) ×3 IMPLANT
SPONGE LAP 18X18 X RAY DECT (DISPOSABLE) ×12 IMPLANT
STAPLER VISISTAT 35W (STAPLE) ×7 IMPLANT
SUT ETHILON 2 0 FS 18 (SUTURE) ×6 IMPLANT
SUT MNCRL AB 4-0 PS2 18 (SUTURE) ×12 IMPLANT
SUT VIC AB 3-0 PS1 18 (SUTURE) ×12
SUT VIC AB 3-0 PS1 18XBRD (SUTURE) ×2 IMPLANT
SUT VICRYL 4-0 PS2 18IN ABS (SUTURE) ×12 IMPLANT
SYR BULB IRRIGATION 50ML (SYRINGE) ×3 IMPLANT
TOWEL OR 17X24 6PK STRL BLUE (TOWEL DISPOSABLE) ×6 IMPLANT
TUBE CONNECTING 20'X1/4 (TUBING) ×1
TUBE CONNECTING 20X1/4 (TUBING) ×2 IMPLANT
UNDERPAD 30X30 INCONTINENT (UNDERPADS AND DIAPERS) ×6 IMPLANT
YANKAUER SUCT BULB TIP NO VENT (SUCTIONS) ×3 IMPLANT

## 2014-08-08 NOTE — Anesthesia Procedure Notes (Signed)
Procedure Name: Intubation Date/Time: 08/08/2014 7:39 AM Performed by: Marrianne Mood Pre-anesthesia Checklist: Patient identified, Emergency Drugs available, Suction available, Patient being monitored and Timeout performed Patient Re-evaluated:Patient Re-evaluated prior to inductionOxygen Delivery Method: Circle System Utilized Preoxygenation: Pre-oxygenation with 100% oxygen Intubation Type: IV induction Ventilation: Mask ventilation without difficulty Grade View: Grade II Tube type: Oral Tube size: 7.0 mm Number of attempts: 1 Airway Equipment and Method: Stylet and Oral airway Placement Confirmation: ETT inserted through vocal cords under direct vision,  positive ETCO2 and breath sounds checked- equal and bilateral Secured at: 21 cm Tube secured with: Tape Dental Injury: Teeth and Oropharynx as per pre-operative assessment

## 2014-08-08 NOTE — Anesthesia Preprocedure Evaluation (Signed)
Anesthesia Evaluation  Patient identified by MRN, date of birth, ID band Patient awake    Reviewed: Allergy & Precautions, NPO status , Patient's Chart, lab work & pertinent test results, reviewed documented beta blocker date and time   Airway Mallampati: II  TM Distance: >3 FB Neck ROM: Full    Dental  (+) Teeth Intact, Dental Advisory Given   Pulmonary neg pulmonary ROS,    Pulmonary exam normal       Cardiovascular hypertension, Pt. on medications     Neuro/Psych PSYCHIATRIC DISORDERS Anxiety Depression negative neurological ROS     GI/Hepatic Neg liver ROS, GERD-  ,  Endo/Other  Morbid obesity  Renal/GU negative Renal ROS  negative genitourinary   Musculoskeletal negative musculoskeletal ROS (+)   Abdominal   Peds negative pediatric ROS (+)  Hematology negative hematology ROS (+)   Anesthesia Other Findings   Reproductive/Obstetrics negative OB ROS                             Anesthesia Physical Anesthesia Plan  ASA: II  Anesthesia Plan: General   Post-op Pain Management:    Induction: Intravenous  Airway Management Planned:   Additional Equipment:   Intra-op Plan:   Post-operative Plan: Extubation in OR  Informed Consent: I have reviewed the patients History and Physical, chart, labs and discussed the procedure including the risks, benefits and alternatives for the proposed anesthesia with the patient or authorized representative who has indicated his/her understanding and acceptance.   Dental advisory given  Plan Discussed with: CRNA, Anesthesiologist and Surgeon  Anesthesia Plan Comments:         Anesthesia Quick Evaluation

## 2014-08-08 NOTE — Discharge Instructions (Signed)
SACRAL DRESSING (Lower Back)   A pressure ulcer is a sore where the skin breaks open   This dressing will be placed on your lower back to protect this area from pressure and moisture and in many cases helps prevent pressure ulcers from forming   A nurse may place this dressing before your surgery or another procedure   A nurse may also place this dressing if you have other conditions that put you at risk for developing a pressure ulcer   If you are getting up and moving around after surgery, the dressing may be taken off with your first shower. Simply remove it and throw it away.   While you are in the hospital, nurses will change the dressing twice a week as long as you are still at risk for developing a pressure ulcer   This dressing is latex free and made with silicone (for adhesive sensitivity) so it is safe and gentle to the skin     Post Anesthesia Home Care Instructions  Activity: Get plenty of rest for the remainder of the day. A responsible adult should stay with you for 24 hours following the procedure.  For the next 24 hours, DO NOT: -Drive a car -Paediatric nurse -Drink alcoholic beverages -Take any medication unless instructed by your physician -Make any legal decisions or sign important papers.  Meals: Start with liquid foods such as gelatin or soup. Progress to regular foods as tolerated. Avoid greasy, spicy, heavy foods. If nausea and/or vomiting occur, drink only clear liquids until the nausea and/or vomiting subsides. Call your physician if vomiting continues.  Special Instructions/Symptoms: Your throat may feel dry or sore from the anesthesia or the breathing tube placed in your throat during surgery. If this causes discomfort, gargle with warm salt water. The discomfort should disappear within 24 hours.  If you had a scopolamine patch placed behind your ear for the management of post- operative nausea and/or vomiting:  1. The medication in the patch  is effective for 72 hours, after which it should be removed.  Wrap patch in a tissue and discard in the trash. Wash hands thoroughly with soap and water. 2. You may remove the patch earlier than 72 hours if you experience unpleasant side effects which may include dry mouth, dizziness or visual disturbances. 3. Avoid touching the patch. Wash your hands with soap and water after contact with the patch.    About my Jackson-Pratt Bulb Drain  What is a Jackson-Pratt bulb? A Jackson-Pratt is a soft, round device used to collect drainage. It is connected to a long, thin drainage catheter, which is held in place by one or two small stiches near your surgical incision site. When the bulb is squeezed, it forms a vacuum, forcing the drainage to empty into the bulb.  Emptying the Jackson-Pratt bulb- To empty the bulb: 1. Release the plug on the top of the bulb. 2. Pour the bulb's contents into a measuring container which your nurse will provide. 3. Record the time emptied and amount of drainage. Empty the drain(s) as often as your     doctor or nurse recommends.  Date                  Time                    Amount (Drain 1)                 Amount (Drain 2)  _____________________________________________________________________  _____________________________________________________________________  _____________________________________________________________________  _____________________________________________________________________  _____________________________________________________________________  _____________________________________________________________________  _____________________________________________________________________  _____________________________________________________________________  Squeezing the Jackson-Pratt Bulb- To squeeze the bulb: 1. Make sure the plug at the top of the bulb is open. 2. Squeeze the bulb tightly in your fist. You will hear air squeezing  from the bulb. 3. Replace the plug while the bulb is squeezed. 4. Use a safety pin to attach the bulb to your clothing. This will keep the catheter from     pulling at the bulb insertion site.  When to call your doctor- Call your doctor if:  Drain site becomes red, swollen or hot.  You have a fever greater than 101 degrees F.  There is oozing at the drain site.  Drain falls out (apply a guaze bandage over the drain hole and secure it with tape).  Drainage increases daily not related to activity patterns. (You will usually have more drainage when you are active than when you are resting.)  Drainage has a bad odor.

## 2014-08-08 NOTE — Op Note (Signed)
Operative Note   DATE OF OPERATION: 4.15.2016  LOCATION: [facility]-outpatient  SURGICAL DIVISION: Plastic Surgery  PREOPERATIVE DIAGNOSES:  1. Macromastia 2.Chronic neck pain 3. Chronic back pain 4. Shoulder grooving  POSTOPERATIVE DIAGNOSES:  same  PROCEDURE:  Bilateral breast reduction  SURGEON: Irene Limbo MD MBA  ASSISTANT: none  ANESTHESIA:  General.   EBL: 65 ml  COMPLICATIONS: None.   INDICATIONS FOR PROCEDURE:  The patient, Cassandra Mcconnell, is a 49 y.o. female born on [birth year], is here for breast reduction in setting of symptomatic macromastia.    FINDINGS: Right breast reduction 901 g Left breast reduction 745 g  DESCRIPTION OF PROCEDURE:  Patient was marked in upright sitting position to mark chest midline, breast meridians and anterior axillary line. The inframammary fold was transferred onto anterior surface of chest by palpation. This was marked symmetrically from sternal notch. With aid of Wise pattern marker, the new nipple areolar complex was marked with 9 cm vertical limbs. The patient was brought into the operating room and placed in a supine position. SCDs were placed and appropriate padding was performed. Antibiotics were given. The patient underwent general anesthesia and the chest was prepped and draped in a sterile fashion. A timeout was performed and all information was confirmed to be correct. In the supine position, the inframammary fold was marked and inferior pedicle designed. With the breast on stretch, a 50 mm diameter marker was used to mark the NAC. The remainder of pedicle was depithelialized. The lateral and medial triangles were resected taking care to preserve a layer of breast tissue and fascia over underlying pectoralis muscle. Additional tissue was excised from superior pole. The wound was irrigated and inspected for hemostasis. The breast was tailor tacked closed. I then moved to right breast where  A 50 mm diameter marker was used to  mark the NAC. The remainder of pedicle was depithelialized. Lateral and medial triangles were resected  Skin flaps developed to allow for redraping over pedicle. The patient was then brought to upright position and assessed for symmetry. The staples were removed, and the additional tissue marked by tailor tacking was removed. The cavities were irrigated and hemostasis confirmed. 15 Fr JP drains were placed in each breast cavity and secured with 2-0 nylon. Closure was completed in layers with 3-0 vicryl for approximation of dermis along inframammary fold and 4-0 vicryl in dermal layer along vertical closure and for inset of NAC. Skin closure was completed throughout with subcuticular 4-0 Monocryl. The nipple and skin flaps had good capillary refill at the end of the procedure. Dermabond was applied followed by dry dressing and breast binder.   The patient was allowed to wake from anesthesia, extubated and taken to the recovery room in satisfactory condition.   SPECIMENS: Right and left breast reduction  DRAINS: 15 Fr JP in right and left breast  Irene Limbo, MD [facility] Plastic & Reconstructive Surgery [phone]

## 2014-08-08 NOTE — Transfer of Care (Signed)
Immediate Anesthesia Transfer of Care Note  Patient: Cassandra Mcconnell  Procedure(s) Performed: Procedure(s): BILATERAL BREAST REDUCTION  (BREAST) (Bilateral)  Patient Location: PACU  Anesthesia Type:General  Level of Consciousness: sedated  Airway & Oxygen Therapy: Patient Spontanous Breathing and Patient connected to face mask oxygen  Post-op Assessment: Report given to RN and Post -op Vital signs reviewed and stable  Post vital signs: Reviewed and stable  Last Vitals:  Filed Vitals:   08/08/14 1146  BP:   Pulse: 92  Temp:   Resp: 19    Complications: No apparent anesthesia complications

## 2014-08-08 NOTE — Interval H&P Note (Signed)
History and Physical Interval Note:  08/08/2014 6:59 AM  Cassandra Mcconnell  has presented today for surgery, with the diagnosis of MACROMASTIA NECK PAIN BACK PAIN OBESE   The various methods of treatment have been discussed with the patient and family. After consideration of risks, benefits and other options for treatment, the patient has consented to  Procedure(s): BILATERAL BREAST REDUCTION  (BREAST) (Bilateral) as a surgical intervention .  The patient's history has been reviewed, patient examined, no change in status, stable for surgery.  I have reviewed the patient's chart and labs.  Questions were answered to the patient's satisfaction.     Aodhan Scheidt

## 2014-08-08 NOTE — Anesthesia Postprocedure Evaluation (Signed)
Anesthesia Post Note  Patient: Cassandra Mcconnell  Procedure(s) Performed: Procedure(s) (LRB): BILATERAL BREAST REDUCTION  (BREAST) (Bilateral)  Anesthesia type: general  Patient location: PACU  Post pain: Pain level controlled  Post assessment: Patient's Cardiovascular Status Stable  Last Vitals:  Filed Vitals:   08/08/14 1245  BP: 127/70  Pulse: 66  Temp:   Resp: 10    Post vital signs: Reviewed and stable  Level of consciousness: sedated  Complications: No apparent anesthesia complications

## 2014-08-11 ENCOUNTER — Encounter (HOSPITAL_BASED_OUTPATIENT_CLINIC_OR_DEPARTMENT_OTHER): Payer: Self-pay | Admitting: Plastic Surgery

## 2014-08-21 ENCOUNTER — Ambulatory Visit: Payer: Commercial Managed Care - HMO | Admitting: Family Medicine

## 2014-08-25 ENCOUNTER — Ambulatory Visit: Payer: Self-pay | Admitting: Family Medicine

## 2014-09-17 ENCOUNTER — Ambulatory Visit: Payer: Self-pay | Admitting: Family Medicine

## 2014-10-12 ENCOUNTER — Emergency Department (HOSPITAL_COMMUNITY): Payer: Commercial Managed Care - HMO

## 2014-10-12 ENCOUNTER — Encounter (HOSPITAL_COMMUNITY): Payer: Self-pay | Admitting: Emergency Medicine

## 2014-10-12 ENCOUNTER — Inpatient Hospital Stay (HOSPITAL_COMMUNITY)
Admission: EM | Admit: 2014-10-12 | Discharge: 2014-10-14 | DRG: 392 | Discharge status: Against Medical Advice | Payer: Commercial Managed Care - HMO | Attending: Internal Medicine | Admitting: Internal Medicine

## 2014-10-12 DIAGNOSIS — Z882 Allergy status to sulfonamides status: Secondary | ICD-10-CM

## 2014-10-12 DIAGNOSIS — K589 Irritable bowel syndrome without diarrhea: Secondary | ICD-10-CM | POA: Diagnosis present

## 2014-10-12 DIAGNOSIS — Z9049 Acquired absence of other specified parts of digestive tract: Secondary | ICD-10-CM | POA: Diagnosis present

## 2014-10-12 DIAGNOSIS — E785 Hyperlipidemia, unspecified: Secondary | ICD-10-CM | POA: Diagnosis present

## 2014-10-12 DIAGNOSIS — Z881 Allergy status to other antibiotic agents status: Secondary | ICD-10-CM

## 2014-10-12 DIAGNOSIS — Z79899 Other long term (current) drug therapy: Secondary | ICD-10-CM | POA: Diagnosis not present

## 2014-10-12 DIAGNOSIS — Z888 Allergy status to other drugs, medicaments and biological substances status: Secondary | ICD-10-CM | POA: Diagnosis not present

## 2014-10-12 DIAGNOSIS — G8929 Other chronic pain: Secondary | ICD-10-CM | POA: Diagnosis present

## 2014-10-12 DIAGNOSIS — K5792 Diverticulitis of intestine, part unspecified, without perforation or abscess without bleeding: Secondary | ICD-10-CM | POA: Diagnosis present

## 2014-10-12 DIAGNOSIS — Z6839 Body mass index (BMI) 39.0-39.9, adult: Secondary | ICD-10-CM | POA: Diagnosis not present

## 2014-10-12 DIAGNOSIS — F329 Major depressive disorder, single episode, unspecified: Secondary | ICD-10-CM | POA: Diagnosis not present

## 2014-10-12 DIAGNOSIS — K219 Gastro-esophageal reflux disease without esophagitis: Secondary | ICD-10-CM | POA: Diagnosis present

## 2014-10-12 DIAGNOSIS — Z8249 Family history of ischemic heart disease and other diseases of the circulatory system: Secondary | ICD-10-CM

## 2014-10-12 DIAGNOSIS — M549 Dorsalgia, unspecified: Secondary | ICD-10-CM | POA: Diagnosis not present

## 2014-10-12 DIAGNOSIS — Z885 Allergy status to narcotic agent status: Secondary | ICD-10-CM

## 2014-10-12 DIAGNOSIS — Z981 Arthrodesis status: Secondary | ICD-10-CM | POA: Diagnosis not present

## 2014-10-12 DIAGNOSIS — F419 Anxiety disorder, unspecified: Secondary | ICD-10-CM | POA: Diagnosis not present

## 2014-10-12 DIAGNOSIS — I10 Essential (primary) hypertension: Secondary | ICD-10-CM | POA: Diagnosis present

## 2014-10-12 DIAGNOSIS — K5710 Diverticulosis of small intestine without perforation or abscess without bleeding: Secondary | ICD-10-CM | POA: Diagnosis not present

## 2014-10-12 DIAGNOSIS — Z880 Allergy status to penicillin: Secondary | ICD-10-CM | POA: Diagnosis not present

## 2014-10-12 DIAGNOSIS — K5732 Diverticulitis of large intestine without perforation or abscess without bleeding: Principal | ICD-10-CM | POA: Diagnosis present

## 2014-10-12 DIAGNOSIS — Z79891 Long term (current) use of opiate analgesic: Secondary | ICD-10-CM

## 2014-10-12 DIAGNOSIS — K5712 Diverticulitis of small intestine without perforation or abscess without bleeding: Secondary | ICD-10-CM | POA: Diagnosis not present

## 2014-10-12 LAB — COMPREHENSIVE METABOLIC PANEL
ALT: 17 U/L (ref 14–54)
AST: 16 U/L (ref 15–41)
Albumin: 3.6 g/dL (ref 3.5–5.0)
Alkaline Phosphatase: 64 U/L (ref 38–126)
Anion gap: 9 (ref 5–15)
BUN: 18 mg/dL (ref 6–20)
CALCIUM: 9.1 mg/dL (ref 8.9–10.3)
CHLORIDE: 105 mmol/L (ref 101–111)
CO2: 22 mmol/L (ref 22–32)
Creatinine, Ser: 0.65 mg/dL (ref 0.44–1.00)
GFR calc Af Amer: >60 mL/min (ref >60)
GFR calc non Af Amer: >60 mL/min (ref >60)
Glucose, Bld: 113 mg/dL — ABNORMAL HIGH (ref 65–99)
Potassium: 3.5 mmol/L (ref 3.5–5.1)
SODIUM: 136 mmol/L (ref 135–145)
TOTAL PROTEIN: 7.8 g/dL (ref 6.5–8.1)
Total Bilirubin: 0.8 mg/dL (ref 0.3–1.2)

## 2014-10-12 LAB — CBC WITH DIFFERENTIAL/PLATELET
Basophils Absolute: 0 10*3/uL (ref 0.0–0.1)
Basophils Relative: 0 % (ref 0–1)
EOS PCT: 1 % (ref 0–5)
Eosinophils Absolute: 0.1 10*3/uL (ref 0.0–0.7)
HEMATOCRIT: 36.3 % (ref 36.0–46.0)
Hemoglobin: 11.9 g/dL — ABNORMAL LOW (ref 12.0–15.0)
LYMPHS ABS: 2.1 10*3/uL (ref 0.7–4.0)
LYMPHS PCT: 13 % (ref 12–46)
MCH: 27.1 pg (ref 26.0–34.0)
MCHC: 32.8 g/dL (ref 30.0–36.0)
MCV: 82.7 fL (ref 78.0–100.0)
MONOS PCT: 7 % (ref 3–12)
Monocytes Absolute: 1.3 10*3/uL — ABNORMAL HIGH (ref 0.1–1.0)
NEUTROS ABS: 13.4 10*3/uL — AB (ref 1.7–7.7)
Neutrophils Relative %: 79 % — ABNORMAL HIGH (ref 43–77)
PLATELETS: 350 10*3/uL (ref 150–400)
RBC: 4.39 MIL/uL (ref 3.87–5.11)
RDW: 14.5 % (ref 11.5–15.5)
WBC: 16.9 10*3/uL — ABNORMAL HIGH (ref 4.0–10.5)

## 2014-10-12 LAB — URINALYSIS, ROUTINE W REFLEX MICROSCOPIC
Bilirubin Urine: NEGATIVE
Glucose, UA: NEGATIVE mg/dL
KETONES UR: NEGATIVE mg/dL
Leukocytes, UA: NEGATIVE
NITRITE: NEGATIVE
Protein, ur: NEGATIVE mg/dL
Specific Gravity, Urine: 1.02 (ref 1.005–1.030)
Urobilinogen, UA: 1 mg/dL (ref 0.0–1.0)
pH: 6 (ref 5.0–8.0)

## 2014-10-12 LAB — URINE MICROSCOPIC-ADD ON

## 2014-10-12 LAB — LIPASE, BLOOD: LIPASE: 23 U/L (ref 22–51)

## 2014-10-12 LAB — WET PREP, GENITAL
Clue Cells Wet Prep HPF POC: NONE SEEN
Trich, Wet Prep: NONE SEEN
YEAST WET PREP: NONE SEEN

## 2014-10-12 LAB — PREGNANCY, URINE: Preg Test, Ur: NEGATIVE

## 2014-10-12 MED ORDER — IOHEXOL 300 MG/ML  SOLN
25.0000 mL | Freq: Once | INTRAMUSCULAR | Status: AC | PRN
  Administered 2014-10-12: 25 mL via ORAL

## 2014-10-12 MED ORDER — OXYCODONE-ACETAMINOPHEN 5-325 MG PO TABS: 1.0000 | ORAL_TABLET | Freq: Once | ORAL | Status: DC

## 2014-10-12 MED ORDER — METOPROLOL TARTRATE 25 MG PO TABS
25.0000 mg | ORAL_TABLET | Freq: Two times a day (BID) | ORAL | Status: DC
  Administered 2014-10-12 – 2014-10-13 (×2): 25 mg via ORAL
  Filled 2014-10-12 (×3): qty 1

## 2014-10-12 MED ORDER — CEFTRIAXONE SODIUM IN DEXTROSE 20 MG/ML IV SOLN
1.0000 g | INTRAVENOUS | Status: DC
  Filled 2014-10-12: qty 50

## 2014-10-12 MED ORDER — CITALOPRAM HYDROBROMIDE 20 MG PO TABS
20.0000 mg | ORAL_TABLET | Freq: Every day | ORAL | Status: DC
  Administered 2014-10-12: 20 mg via ORAL
  Filled 2014-10-12: qty 1

## 2014-10-12 MED ORDER — SODIUM CHLORIDE 0.9 % IV SOLN
INTRAVENOUS | Status: DC
  Administered 2014-10-12 – 2014-10-13 (×2): via INTRAVENOUS

## 2014-10-12 MED ORDER — DEXTROSE 5 % IV SOLN: 1.0000 g | INTRAVENOUS | Status: DC

## 2014-10-12 MED ORDER — CEFTRIAXONE SODIUM 1 G IJ SOLR
1.0000 g | Freq: Once | INTRAMUSCULAR | Status: AC
  Administered 2014-10-12: 1 g via INTRAVENOUS
  Filled 2014-10-12: qty 10

## 2014-10-12 MED ORDER — METRONIDAZOLE IN NACL 5-0.79 MG/ML-% IV SOLN
500.0000 mg | Freq: Once | INTRAVENOUS | Status: AC
  Administered 2014-10-12: 500 mg via INTRAVENOUS
  Filled 2014-10-12: qty 100

## 2014-10-12 MED ORDER — ONDANSETRON HCL 4 MG/2ML IJ SOLN
4.0000 mg | Freq: Four times a day (QID) | INTRAMUSCULAR | Status: DC | PRN
  Administered 2014-10-13: 4 mg via INTRAVENOUS
  Filled 2014-10-12: qty 2

## 2014-10-12 MED ORDER — ONDANSETRON HCL 4 MG PO TABS: 4.0000 mg | ORAL_TABLET | Freq: Four times a day (QID) | ORAL | Status: DC | PRN

## 2014-10-12 MED ORDER — ONDANSETRON HCL 4 MG/2ML IJ SOLN: 4.0000 mg | Freq: Once | INTRAMUSCULAR | Status: DC

## 2014-10-12 MED ORDER — IOHEXOL 300 MG/ML  SOLN
100.0000 mL | Freq: Once | INTRAMUSCULAR | Status: AC | PRN
  Administered 2014-10-12: 100 mL via INTRAVENOUS

## 2014-10-12 MED ORDER — MORPHINE SULFATE 4 MG/ML IJ SOLN: 4.0000 mg | Freq: Once | INTRAMUSCULAR | Status: DC

## 2014-10-12 MED ORDER — METRONIDAZOLE IN NACL 5-0.79 MG/ML-% IV SOLN
500.0000 mg | Freq: Three times a day (TID) | INTRAVENOUS | Status: DC
  Administered 2014-10-12 – 2014-10-14 (×6): 500 mg via INTRAVENOUS
  Filled 2014-10-12 (×5): qty 100

## 2014-10-12 MED ORDER — HEPARIN SODIUM (PORCINE) 5000 UNIT/ML IJ SOLN: 5000.0000 [IU] | Freq: Three times a day (TID) | INTRAMUSCULAR | Status: DC

## 2014-10-12 MED ORDER — OXYCODONE-ACETAMINOPHEN 5-325 MG PO TABS
1.0000 | ORAL_TABLET | ORAL | Status: DC | PRN
  Administered 2014-10-12: 2 via ORAL
  Administered 2014-10-13 (×2): 1 via ORAL
  Administered 2014-10-13: 2 via ORAL
  Filled 2014-10-12 (×2): qty 1
  Filled 2014-10-12 (×2): qty 2

## 2014-10-12 MED ORDER — MORPHINE SULFATE 4 MG/ML IJ SOLN
4.0000 mg | INTRAMUSCULAR | Status: DC | PRN
  Filled 2014-10-12: qty 1

## 2014-10-12 MED ORDER — SODIUM CHLORIDE 0.9 % IV BOLUS (SEPSIS)
1000.0000 mL | Freq: Once | INTRAVENOUS | Status: AC
  Administered 2014-10-12: 1000 mL via INTRAVENOUS

## 2014-10-12 MED ORDER — SODIUM CHLORIDE 0.9 % IV SOLN: INTRAVENOUS | Status: DC

## 2014-10-12 NOTE — ED Notes (Signed)
Pt reports LLQ abdominal pain that started Thur night. Pt denies emesis, diarrhea or fever. No back pain.

## 2014-10-12 NOTE — H&P (Signed)
[facility] History and Physical  Cassandra Mcconnell ZOX:[id number] DOB: 05-May-1965 DOA: 10/12/2014  Referring physician: ER PCP: Arnette Norris, MD   Chief Complaint: Left lower quadrant abdominal pain  HPI: Cassandra Mcconnell is a 49 y.o. female  This is a 49 year old morbidly obese lady who gives a 3 to four-day history of left lower quadrant abdominal pain that is intermittent and cramping in nature. It can last for several minutes at a time. There is no nausea, vomiting, diarrhea, fever or chills associated with it. There is no radiation of the pain. There is a previous history of cholecystectomy. There is no abnormal vagina bleeding or discharge. She denies any urinary symptoms. There is no history of constipation. Evaluation in the emergency room with CT scan shows the presence of diverticulitis. She is now being admitted for further management.   Review of Systems:  Apart from symptoms above, all systems negative.  Past Medical History  Diagnosis Date  . Anxiety   . GERD (gastroesophageal reflux disease)   . Hyperlipidemia   . Hypertension   . Wears glasses   . Depression   . Back pain    Past Surgical History  Procedure Laterality Date  . Septoplasty  1988  . Tonsillectomy  1991  . Cholecystectomy      1998  . Back surgery  2004    l4/5 fusion  . Breast reduction surgery Bilateral 08/08/2014    Procedure: BILATERAL BREAST REDUCTION  (BREAST);  Surgeon: Irene Limbo, MD;  Location: [facility];  Service: Plastics;  Laterality: Bilateral;   Social History:  reports that she has never smoked. She has never used smokeless tobacco. She reports that she does not drink alcohol or use illicit drugs.  Allergies  Allergen Reactions  . Penicillins Shortness Of Breath  . Azithromycin Swelling  . Cimetidine Other (See Comments)    unknown  . Conjugated Estrogens Other (See Comments)    emotional  . Cyclobenzaprine Hcl     REACTION: disorientation  .  Dicyclomine Hcl Other (See Comments)    unknown  . Lisinopril     REACTION: RASH AND MOUTH SORE  . Norco [Hydrocodone-Acetaminophen] Other (See Comments)    Felt like she was going through withdrawal  . Paroxetine Other (See Comments)    Unknown  . Rosuvastatin Dermatitis  . Sulfa Antibiotics Hives and Itching  . Wellbutrin [Bupropion Hcl] Other (See Comments)    Emotional, makes depression worse  . Ciprofloxacin Itching and Rash  . Doxycycline Hyclate Itching and Rash  . Erythromycin Base Itching and Rash  . Prednisone Rash    Family History  Problem Relation Age of Onset  . COPD Mother   . Hypertension Mother   . GER disease Mother   . Heart attack Father   . Cancer Father     prostate  . Hyperlipidemia Brother   . Hypertension Brother   . Heart attack Paternal Grandmother   . Heart attack Paternal Grandfather   . Stroke Paternal Grandfather   . Hyperlipidemia Brother   . Hypertension Brother     Prior to Admission medications   Medication Sig Start Date End Date Taking? Authorizing Provider  citalopram (CELEXA) 20 MG tablet 1 tab by mouth twice daily Patient taking differently: Take 20 mg by mouth at bedtime. 1 tab by mouth twice daily 07/23/14  Yes Lucille Passy, MD  ibuprofen (ADVIL,MOTRIN) 200 MG tablet Take 800 mg by mouth daily as needed. For cramps   Yes Historical  Provider, MD  metoprolol tartrate (LOPRESSOR) 25 MG tablet Take 1 tablet (25 mg total) by mouth 2 (two) times daily. Patient taking differently: Take 25 mg by mouth at bedtime.  06/23/14  Yes Lucille Passy, MD  traMADol (ULTRAM) 50 MG tablet Take 1 tablet (50 mg total) by mouth every 8 (eight) hours as needed. 07/23/14  Yes Lucille Passy, MD  triamcinolone cream (KENALOG) 0.1 % Apply 1 application topically 2 (two) times daily. 05/31/13  Yes Jearld Fenton, NP  acetaminophen (TYLENOL) 500 MG tablet Take 1,000 mg by mouth every 6 (six) hours as needed. For pain     Historical Provider, MD    HYDROcodone-acetaminophen (NORCO) 5-325 MG per tablet Take 1-2 tablets by mouth every 4 (four) hours as needed for moderate pain. 08/08/14   Irene Limbo, MD   Physical Exam: Filed Vitals:   10/12/14 1700 10/12/14 1730 10/12/14 1820 10/12/14 1830  BP: 134/70 134/69 131/61 116/60  Pulse: 82  87 86  Temp:      TempSrc:      Resp: 18  18   Height:      Weight:      SpO2: 100%  100% 99%    Wt Readings from Last 3 Encounters:  10/12/14 106.595 kg (235 lb)  08/08/14 108.58 kg (239 lb 6 oz)  07/23/14 109.997 kg (242 lb 8 oz)    General:  Appears calm and comfortable. She is not toxic or septic clinic. Morbidly obese. Eyes: PERRL, normal lids, irises & conjunctiva ENT: grossly normal hearing, lips & tongue Neck: no LAD, masses or thyromegaly Cardiovascular: RRR, no m/r/g. No LE edema. Telemetry: SR, no arrhythmias  Respiratory: CTA bilaterally, no w/r/r. Normal respiratory effort. Abdomen: soft, tender in the left lower quadrant. Bowel sounds are heard, somewhat scanty. Clinically, she does not have an acute abdomen. Skin: no rash or induration seen on limited exam Musculoskeletal: grossly normal tone BUE/BLE Psychiatric: grossly normal mood and affect, speech fluent and appropriate Neurologic: grossly non-focal.          Labs on Admission:  Basic Metabolic Panel:  Recent Labs Lab 10/12/14 1511  NA 136  K 3.5  CL 105  CO2 22  GLUCOSE 113*  BUN 18  CREATININE 0.65  CALCIUM 9.1   Liver Function Tests:  Recent Labs Lab 10/12/14 1511  AST 16  ALT 17  ALKPHOS 64  BILITOT 0.8  PROT 7.8  ALBUMIN 3.6    Recent Labs Lab 10/12/14 1511  LIPASE 23   No results for input(s): AMMONIA in the last 168 hours. CBC:  Recent Labs Lab 10/12/14 1511  WBC 16.9*  NEUTROABS 13.4*  HGB 11.9*  HCT 36.3  MCV 82.7  PLT 350   Cardiac Enzymes: No results for input(s): CKTOTAL, CKMB, CKMBINDEX, TROPONINI in the last 168 hours.  BNP (last 3 results) No results for  input(s): BNP in the last 8760 hours.  ProBNP (last 3 results) No results for input(s): PROBNP in the last 8760 hours.  CBG: No results for input(s): GLUCAP in the last 168 hours.  Radiological Exams on Admission: Ct Abdomen Pelvis W Contrast  10/12/2014   CLINICAL DATA:  Left lower quadrant pain for 3 days  EXAM: CT ABDOMEN AND PELVIS WITH CONTRAST  TECHNIQUE: Multidetector CT imaging of the abdomen and pelvis was performed using the standard protocol following bolus administration of intravenous contrast. Oral contrast was also administered.  CONTRAST:  177mL OMNIPAQUE IOHEXOL 300 MG/ML SOLN, 68mL OMNIPAQUE IOHEXOL 300 MG/ML  SOLN  COMPARISON:  None.  FINDINGS: Lung bases are clear.  There is a small hiatal hernia.  No focal liver lesions are identified. The gallbladder is absent. There is no biliary duct dilatation.  Spleen, pancreas, and adrenals appear normal. Kidneys bilaterally show no mass or hydronephrosis on either side. There is no renal or ureteral calculus on either side.  In the pelvis, the urinary bladder is midline with normal wall thickness. There is a mildly complex fluid collection in the right mid to lower pelvis located immediately to the right of the uterus and terminating inferiorly into a cystic structure anterior to the uterus in the lower pelvis. This fluid collection measures 12.2 x 10.1 x 6.1 cm. The cystic structure along the inferior aspect of this fluid collection measures 5.2 x 4.9 cm.  The appendix is prominent, measuring 9 mm in thickness. There is no surrounding periappendiceal mesenteric thickening. The appendix extends medially and terminates just lateral to the fluid collection in the right pelvis.  There are multiple sigmoid diverticula. In the distal descending colon extending to the descending colon -sigmoid junction, there is evidence of diverticulitis with moderate mesenteric inflammation in this area. There is irregular wall thickening in this area. There is a  developing phlegmon within this area measuring 2.5 x 1.7 cm, best seen on axial slice 54 series 2. No perforation is identified.  There is no bowel obstruction. No free air or portal venous air. There is no adenopathy or ascites in the abdomen or pelvis. There is atherosclerotic change in the aorta but no aneurysm. There is postoperative change in the lower lumbar spine. There are no blastic or lytic bone lesions.  IMPRESSION: There is distal descending colon diverticulitis with early developing phlegmon within this area of diverticulitis. A well-formed abscess is not appreciable at this time. No perforation is seen. There is wall thickening and surrounding mesenteric inflammation focally in the left lateral lower abdomen -abdomen/pelvic junction. There are multiple sigmoid and descending colonic diverticula.  In the right mid to lower pelvis, there is a slightly complex fluid collection which extends from slightly superior to the uterus toward the right and inferiorly, terminating at a cystic structure measuring 5.2 x 4.9 cm. The overall fluid collection measures 12.2 x 10.1 x 6.1 cm. Differential considerations for this fluid collection include loculated fluid from recent ovarian cyst leakage/rupture versus chronic hydrosalpinx. Note that the appendix is mildly prominent and is seen slightly lateral to this fluid collection. Conceivably, a chronic appendiceal rupture with fluid collection medially could have this appearance, although the appendix appears slightly separate from this fluid collection.  There is a small hiatal hernia. Gallbladder is absent. There is no renal or ureteral calculus. No hydronephrosis.  These results were called by telephone at the time of interpretation on 10/12/2014 at 5:58 pm to Dr. Ezequiel Essex , who verbally acknowledged these results.   Electronically Signed   By: Lowella Grip III M.D.   On: 10/12/2014 17:58     Assessment/Plan   1. Diverticulitis of the descending  colon with early phlegmon. She will be treated with intravenous antibodies, nothing by mouth and IV fluids. Surgery has been consulted and will see the patient within the next 24 hours. 2. Complex fluid collection in the right pelvic area. The etiology of this is unclear to me. Her abdomen is nontender in this area whatsoever. Surgery opinion would be valuable. 3. Hypertension. Stable.  Further recommendations will depend on patient's hospital progress.   Code Status: Full  code.   DVT Prophylaxis: Heparin.  Family Communication: I discussed the plan with the patient at the bedside.  Disposition Plan: Home when medically stable.  Time spent: 45 minutes.  Doree Albee [facility] Pager [phone].

## 2014-10-12 NOTE — ED Notes (Signed)
Pelvic setup at bedside.

## 2014-10-12 NOTE — ED Notes (Signed)
Hospitalist at bedside 

## 2014-10-12 NOTE — ED Provider Notes (Signed)
CSN: [id number]     Arrival date & time 10/12/14  1414 History   First MD Initiated Contact with Patient 10/12/14 1456     Chief Complaint  Patient presents with  . Abdominal Pain     (Consider location/radiation/quality/duration/timing/severity/associated sxs/prior Treatment) HPI Comments: The patient was treated history of left-sided lower abdominal pain that comes and goes. She reports the pain is worse when she moves around with rest. Last for several minutes at a time. She denies any associated symptoms. No fever, chills, nausea, vomiting, diarrhea. No constipation. No urinary or vaginal symptoms. No back pain. No chest pain. History of similar pain in the past that she self treated to "gastritis". She denies any history of liver and cysts. She is a previous cholecystectomy. She denies any heart or lung problems. She denies any abnormal vaginal bleeding or discharge. The pain improves with Tylenol with rest. It is worse when she moves around.  The history is provided by the patient.    Past Medical History  Diagnosis Date  . Anxiety   . GERD (gastroesophageal reflux disease)   . Hyperlipidemia   . Hypertension   . Wears glasses   . Depression   . Back pain    Past Surgical History  Procedure Laterality Date  . Septoplasty  1988  . Tonsillectomy  1991  . Cholecystectomy      1998  . Back surgery  2004    l4/5 fusion  . Breast reduction surgery Bilateral 08/08/2014    Procedure: BILATERAL BREAST REDUCTION  (BREAST);  Surgeon: Irene Limbo, MD;  Location: [facility];  Service: Plastics;  Laterality: Bilateral;   Family History  Problem Relation Age of Onset  . COPD Mother   . Hypertension Mother   . GER disease Mother   . Heart attack Father   . Cancer Father     prostate  . Hyperlipidemia Brother   . Hypertension Brother   . Heart attack Paternal Grandmother   . Heart attack Paternal Grandfather   . Stroke Paternal Grandfather   . Hyperlipidemia  Brother   . Hypertension Brother    History  Substance Use Topics  . Smoking status: Never Smoker   . Smokeless tobacco: Never Used  . Alcohol Use: No   OB History    No data available     Review of Systems  Constitutional: Negative for fever, activity change and appetite change.  HENT: Negative for congestion and rhinorrhea.   Eyes: Negative for visual disturbance.  Respiratory: Negative for cough, chest tightness and shortness of breath.   Gastrointestinal: Positive for abdominal pain. Negative for nausea, vomiting, diarrhea and constipation.  Genitourinary: Positive for pelvic pain. Negative for dysuria, hematuria, vaginal bleeding and vaginal discharge.  Musculoskeletal: Negative for myalgias and arthralgias.  Skin: Negative for rash.  Neurological: Negative for dizziness, weakness and headaches.  A complete 10 system review of systems was obtained and all systems are negative except as noted in the HPI and PMH.      Allergies  Penicillins; Azithromycin; Cimetidine; Conjugated estrogens; Cyclobenzaprine hcl; Dicyclomine hcl; Lisinopril; Norco; Paroxetine; Rosuvastatin; Sulfa antibiotics; Wellbutrin; Ciprofloxacin; Doxycycline hyclate; Erythromycin base; and Prednisone  Home Medications   Prior to Admission medications   Medication Sig Start Date End Date Taking? Authorizing Provider  citalopram (CELEXA) 20 MG tablet 1 tab by mouth twice daily Patient taking differently: Take 20 mg by mouth at bedtime. 1 tab by mouth twice daily 07/23/14  Yes Lucille Passy, MD  ibuprofen (  ADVIL,MOTRIN) 200 MG tablet Take 800 mg by mouth daily as needed. For cramps   Yes Historical Provider, MD  metoprolol tartrate (LOPRESSOR) 25 MG tablet Take 1 tablet (25 mg total) by mouth 2 (two) times daily. Patient taking differently: Take 25 mg by mouth at bedtime.  06/23/14  Yes Lucille Passy, MD  traMADol (ULTRAM) 50 MG tablet Take 1 tablet (50 mg total) by mouth every 8 (eight) hours as needed. 07/23/14   Yes Lucille Passy, MD  triamcinolone cream (KENALOG) 0.1 % Apply 1 application topically 2 (two) times daily. 05/31/13  Yes Jearld Fenton, NP  acetaminophen (TYLENOL) 500 MG tablet Take 1,000 mg by mouth every 6 (six) hours as needed. For pain     Historical Provider, MD  HYDROcodone-acetaminophen (NORCO) 5-325 MG per tablet Take 1-2 tablets by mouth every 4 (four) hours as needed for moderate pain. 08/08/14   Irene Limbo, MD   BP 116/60 mmHg  Pulse 86  Temp(Src) 97.7 F (36.5 C) (Oral)  Resp 18  Ht 5\' 4"  (1.626 m)  Wt 235 lb (106.595 kg)  BMI 40.32 kg/m2  SpO2 99%  LMP 10/08/2014 Physical Exam  Constitutional: She is oriented to person, place, and time. She appears well-developed and well-nourished. No distress.  HENT:  Head: Normocephalic and atraumatic.  Mouth/Throat: Oropharynx is clear and moist. No oropharyngeal exudate.  Eyes: Conjunctivae and EOM are normal. Pupils are equal, round, and reactive to light.  Neck: Normal range of motion. Neck supple.  No meningismus.  Cardiovascular: Normal rate, regular rhythm, normal heart sounds and intact distal pulses.   No murmur heard. Pulmonary/Chest: Effort normal and breath sounds normal. No respiratory distress.  Abdominal: Soft. There is tenderness. There is guarding. There is no rebound.  Obese, exquisitely tender in  left lower quadrant with voluntary guarding.  No CVAT  Genitourinary: Vaginal discharge found.  Chaperone present. Normal external genitalia. Scant white vaginal discharge. No CMT. No adnexal tenderness  Musculoskeletal: Normal range of motion. She exhibits no edema or tenderness.  Neurological: She is alert and oriented to person, place, and time. No cranial nerve deficit. She exhibits normal muscle tone. Coordination normal.  No ataxia on finger to nose bilaterally. No pronator drift. 5/5 strength throughout. CN 2-12 intact. Negative Romberg. Equal grip strength. Sensation intact. Gait is normal.   Skin: Skin is  warm.  Psychiatric: She has a normal mood and affect. Her behavior is normal.  Nursing note and vitals reviewed.   ED Course  Procedures (including critical care time) Labs Review Labs Reviewed  WET PREP, GENITAL - Abnormal; Notable for the following:    WBC, Wet Prep HPF POC FEW (*)    All other components within normal limits  CBC WITH DIFFERENTIAL/PLATELET - Abnormal; Notable for the following:    WBC 16.9 (*)    Hemoglobin 11.9 (*)    Neutrophils Relative % 79 (*)    Neutro Abs 13.4 (*)    Monocytes Absolute 1.3 (*)    All other components within normal limits  COMPREHENSIVE METABOLIC PANEL - Abnormal; Notable for the following:    Glucose, Bld 113 (*)    All other components within normal limits  URINALYSIS, ROUTINE W REFLEX MICROSCOPIC (NOT AT [facility]) - Abnormal; Notable for the following:    APPearance HAZY (*)    Hgb urine dipstick TRACE (*)    All other components within normal limits  URINE MICROSCOPIC-ADD ON - Abnormal; Notable for the following:    Squamous Epithelial /  LPF MANY (*)    Bacteria, UA MANY (*)    All other components within normal limits  LIPASE, BLOOD  PREGNANCY, URINE  GC/CHLAMYDIA PROBE AMP ([place]) NOT AT [facility]    Imaging Review Ct Abdomen Pelvis W Contrast  10/12/2014   CLINICAL DATA:  Left lower quadrant pain for 3 days  EXAM: CT ABDOMEN AND PELVIS WITH CONTRAST  TECHNIQUE: Multidetector CT imaging of the abdomen and pelvis was performed using the standard protocol following bolus administration of intravenous contrast. Oral contrast was also administered.  CONTRAST:  134mL OMNIPAQUE IOHEXOL 300 MG/ML SOLN, 68mL OMNIPAQUE IOHEXOL 300 MG/ML SOLN  COMPARISON:  None.  FINDINGS: Lung bases are clear.  There is a small hiatal hernia.  No focal liver lesions are identified. The gallbladder is absent. There is no biliary duct dilatation.  Spleen, pancreas, and adrenals appear normal. Kidneys bilaterally show no mass or hydronephrosis on either side.  There is no renal or ureteral calculus on either side.  In the pelvis, the urinary bladder is midline with normal wall thickness. There is a mildly complex fluid collection in the right mid to lower pelvis located immediately to the right of the uterus and terminating inferiorly into a cystic structure anterior to the uterus in the lower pelvis. This fluid collection measures 12.2 x 10.1 x 6.1 cm. The cystic structure along the inferior aspect of this fluid collection measures 5.2 x 4.9 cm.  The appendix is prominent, measuring 9 mm in thickness. There is no surrounding periappendiceal mesenteric thickening. The appendix extends medially and terminates just lateral to the fluid collection in the right pelvis.  There are multiple sigmoid diverticula. In the distal descending colon extending to the descending colon -sigmoid junction, there is evidence of diverticulitis with moderate mesenteric inflammation in this area. There is irregular wall thickening in this area. There is a developing phlegmon within this area measuring 2.5 x 1.7 cm, best seen on axial slice 54 series 2. No perforation is identified.  There is no bowel obstruction. No free air or portal venous air. There is no adenopathy or ascites in the abdomen or pelvis. There is atherosclerotic change in the aorta but no aneurysm. There is postoperative change in the lower lumbar spine. There are no blastic or lytic bone lesions.  IMPRESSION: There is distal descending colon diverticulitis with early developing phlegmon within this area of diverticulitis. A well-formed abscess is not appreciable at this time. No perforation is seen. There is wall thickening and surrounding mesenteric inflammation focally in the left lateral lower abdomen -abdomen/pelvic junction. There are multiple sigmoid and descending colonic diverticula.  In the right mid to lower pelvis, there is a slightly complex fluid collection which extends from slightly superior to the uterus  toward the right and inferiorly, terminating at a cystic structure measuring 5.2 x 4.9 cm. The overall fluid collection measures 12.2 x 10.1 x 6.1 cm. Differential considerations for this fluid collection include loculated fluid from recent ovarian cyst leakage/rupture versus chronic hydrosalpinx. Note that the appendix is mildly prominent and is seen slightly lateral to this fluid collection. Conceivably, a chronic appendiceal rupture with fluid collection medially could have this appearance, although the appendix appears slightly separate from this fluid collection.  There is a small hiatal hernia. Gallbladder is absent. There is no renal or ureteral calculus. No hydronephrosis.  These results were called by telephone at the time of interpretation on 10/12/2014 at 5:58 pm to Dr. Ezequiel Essex , who verbally acknowledged these results.  Electronically Signed   By: Lowella Grip III M.D.   On: 10/12/2014 17:58     EKG Interpretation None      MDM   Final diagnoses:  Diverticulitis of large intestine without perforation or abscess without bleeding   3 day history of left-sided lower abdominal pelvic pain. No associated symptoms.  Urinalysis is trace hemoglobin and appears contaminated with squamous cells and bacteria. Pelvic exam is benign. Pelvic exam is benign. Labas remarkable for leukocytosis of 17.  CT findings d/w Dr. Gershon Crane who agrees with IV antibiotics, bowel rest and he will evaluate.  Patient continues to decline pain medication. CT is results discussed with her. Will treat with Rocephin and Flagyl given her antibiotics allergies.  Admission d/w Dr. Anastasio Champion.   Ezequiel Essex, MD 10/12/14 [phone]

## 2014-10-12 NOTE — Consult Note (Signed)
Discussed by phone with Dr. Wyvonnia Dusky.  Her symptoms are all on her left side and the WBC and CT scan findings are consistent with sigmoid diverticulitis with phlegmon but no abscess or perforation.  The cystic structure/ fluid collection on the right seem to be incidental findings and probably warrant further work-up after the diverticulitis has resolved.  Would ask [facility] to admit the patient overnight for IV antibiotics and bowel rest.  Please repeat CBC in AM.  She will be evaluated by surgery in the morning.  Lab Results  Component Value Date   WBC 16.9* 10/12/2014   HGB 11.9* 10/12/2014   HCT 36.3 10/12/2014   MCV 82.7 10/12/2014   PLT 350 10/12/2014   Lab Results  Component Value Date   CREATININE 0.65 10/12/2014   BUN 18 10/12/2014   NA 136 10/12/2014   K 3.5 10/12/2014   CL 105 10/12/2014   CO2 22 10/12/2014   Lab Results  Component Value Date   LIPASE 23 10/12/2014   Ct Abdomen Pelvis W Contrast  10/12/2014   CLINICAL DATA:  Left lower quadrant pain for 3 days  EXAM: CT ABDOMEN AND PELVIS WITH CONTRAST  TECHNIQUE: Multidetector CT imaging of the abdomen and pelvis was performed using the standard protocol following bolus administration of intravenous contrast. Oral contrast was also administered.  CONTRAST:  160mL OMNIPAQUE IOHEXOL 300 MG/ML SOLN, 59mL OMNIPAQUE IOHEXOL 300 MG/ML SOLN  COMPARISON:  None.  FINDINGS: Lung bases are clear.  There is a small hiatal hernia.  No focal liver lesions are identified. The gallbladder is absent. There is no biliary duct dilatation.  Spleen, pancreas, and adrenals appear normal. Kidneys bilaterally show no mass or hydronephrosis on either side. There is no renal or ureteral calculus on either side.  In the pelvis, the urinary bladder is midline with normal wall thickness. There is a mildly complex fluid collection in the right mid to lower pelvis located immediately to the right of the uterus and terminating inferiorly into a cystic structure  anterior to the uterus in the lower pelvis. This fluid collection measures 12.2 x 10.1 x 6.1 cm. The cystic structure along the inferior aspect of this fluid collection measures 5.2 x 4.9 cm.  The appendix is prominent, measuring 9 mm in thickness. There is no surrounding periappendiceal mesenteric thickening. The appendix extends medially and terminates just lateral to the fluid collection in the right pelvis.  There are multiple sigmoid diverticula. In the distal descending colon extending to the descending colon -sigmoid junction, there is evidence of diverticulitis with moderate mesenteric inflammation in this area. There is irregular wall thickening in this area. There is a developing phlegmon within this area measuring 2.5 x 1.7 cm, best seen on axial slice 54 series 2. No perforation is identified.  There is no bowel obstruction. No free air or portal venous air. There is no adenopathy or ascites in the abdomen or pelvis. There is atherosclerotic change in the aorta but no aneurysm. There is postoperative change in the lower lumbar spine. There are no blastic or lytic bone lesions.  IMPRESSION: There is distal descending colon diverticulitis with early developing phlegmon within this area of diverticulitis. A well-formed abscess is not appreciable at this time. No perforation is seen. There is wall thickening and surrounding mesenteric inflammation focally in the left lateral lower abdomen -abdomen/pelvic junction. There are multiple sigmoid and descending colonic diverticula.  In the right mid to lower pelvis, there is a slightly complex fluid collection which extends  from slightly superior to the uterus toward the right and inferiorly, terminating at a cystic structure measuring 5.2 x 4.9 cm. The overall fluid collection measures 12.2 x 10.1 x 6.1 cm. Differential considerations for this fluid collection include loculated fluid from recent ovarian cyst leakage/rupture versus chronic hydrosalpinx. Note that  the appendix is mildly prominent and is seen slightly lateral to this fluid collection. Conceivably, a chronic appendiceal rupture with fluid collection medially could have this appearance, although the appendix appears slightly separate from this fluid collection.  There is a small hiatal hernia. Gallbladder is absent. There is no renal or ureteral calculus. No hydronephrosis.  These results were called by telephone at the time of interpretation on 10/12/2014 at 5:58 pm to Dr. Ezequiel Essex , who verbally acknowledged these results.   Electronically Signed   By: Lowella Grip III M.D.   On: 10/12/2014 17:58    Imogene Burn. Georgette Dover, MD, [facility]  General/ Trauma Surgery  10/12/2014 6:49 PM

## 2014-10-12 NOTE — ED Notes (Signed)
Pt returned from CT. Per CT tech, pt was receiving IV contrast when she stated she "was having a hard time catching her breath." Sats were 99% on room air. Pt has had IV contrast 2X in past. CT tech instructed pt to take slow deep breaths. Pt calmed down. NAD noted upon arrival back to room. VSS.

## 2014-10-13 DIAGNOSIS — K5712 Diverticulitis of small intestine without perforation or abscess without bleeding: Secondary | ICD-10-CM

## 2014-10-13 LAB — COMPREHENSIVE METABOLIC PANEL
ALT: 55 U/L — ABNORMAL HIGH (ref 14–54)
AST: 82 U/L — AB (ref 15–41)
Albumin: 2.9 g/dL — ABNORMAL LOW (ref 3.5–5.0)
Alkaline Phosphatase: 80 U/L (ref 38–126)
Anion gap: 7 (ref 5–15)
BUN: 11 mg/dL (ref 6–20)
CALCIUM: 7.9 mg/dL — AB (ref 8.9–10.3)
CO2: 23 mmol/L (ref 22–32)
Chloride: 107 mmol/L (ref 101–111)
Creatinine, Ser: 0.6 mg/dL (ref 0.44–1.00)
GFR calc Af Amer: >60 mL/min (ref >60)
Glucose, Bld: 115 mg/dL — ABNORMAL HIGH (ref 65–99)
Potassium: 3.9 mmol/L (ref 3.5–5.1)
Sodium: 137 mmol/L (ref 135–145)
TOTAL PROTEIN: 6.2 g/dL — AB (ref 6.5–8.1)
Total Bilirubin: 1.2 mg/dL (ref 0.3–1.2)

## 2014-10-13 LAB — CBC
HCT: 34.2 % — ABNORMAL LOW (ref 36.0–46.0)
Hemoglobin: 10.8 g/dL — ABNORMAL LOW (ref 12.0–15.0)
MCH: 26.7 pg (ref 26.0–34.0)
MCHC: 31.6 g/dL (ref 30.0–36.0)
MCV: 84.4 fL (ref 78.0–100.0)
Platelets: 314 10*3/uL (ref 150–400)
RBC: 4.05 MIL/uL (ref 3.87–5.11)
RDW: 14.6 % (ref 11.5–15.5)
WBC: 15 10*3/uL — ABNORMAL HIGH (ref 4.0–10.5)

## 2014-10-13 LAB — GC/CHLAMYDIA PROBE AMP (~~LOC~~) NOT AT ARMC
Chlamydia: NEGATIVE
NEISSERIA GONORRHEA: NEGATIVE

## 2014-10-13 MED ORDER — METOPROLOL TARTRATE 25 MG PO TABS
25.0000 mg | ORAL_TABLET | Freq: Two times a day (BID) | ORAL | Status: DC
  Administered 2014-10-13 – 2014-10-14 (×2): 25 mg via ORAL
  Filled 2014-10-13 (×2): qty 1

## 2014-10-13 MED ORDER — DEXTROSE 5 % IV SOLN
2.0000 g | INTRAVENOUS | Status: DC
  Administered 2014-10-13: 2 g via INTRAVENOUS
  Filled 2014-10-13 (×2): qty 2

## 2014-10-13 MED ORDER — CITALOPRAM HYDROBROMIDE 20 MG PO TABS: 40.0000 mg | ORAL_TABLET | Freq: Every day | ORAL | Status: DC

## 2014-10-13 MED ORDER — CEFTRIAXONE SODIUM IN DEXTROSE 40 MG/ML IV SOLN: 2.0000 g | INTRAVENOUS | Status: DC

## 2014-10-13 MED ORDER — CITALOPRAM HYDROBROMIDE 20 MG PO TABS
20.0000 mg | ORAL_TABLET | Freq: Once | ORAL | Status: AC
  Administered 2014-10-13: 20 mg via ORAL
  Filled 2014-10-13: qty 1

## 2014-10-13 MED ORDER — ENOXAPARIN SODIUM 40 MG/0.4ML ~~LOC~~ SOLN: 40.0000 mg | Freq: Every day | SUBCUTANEOUS | Status: DC

## 2014-10-13 MED ORDER — CITALOPRAM HYDROBROMIDE 20 MG PO TABS
20.0000 mg | ORAL_TABLET | ORAL | Status: AC
  Administered 2014-10-13: 20 mg via ORAL
  Filled 2014-10-13: qty 1

## 2014-10-13 MED ORDER — SODIUM CHLORIDE 0.9 % IV SOLN: INTRAVENOUS | Status: DC

## 2014-10-13 MED ORDER — POTASSIUM CHLORIDE IN NACL 20-0.9 MEQ/L-% IV SOLN
INTRAVENOUS | Status: DC
  Administered 2014-10-13: 1 mL via INTRAVENOUS
  Administered 2014-10-13 – 2014-10-14 (×2): via INTRAVENOUS

## 2014-10-13 NOTE — Progress Notes (Signed)
[facility] PROGRESS NOTE  MONAYE BLACKIE ION:[id number] DOB: 06-28-1965 DOA: 10/12/2014 PCP: Arnette Norris, MD  Assessment/Plan: Acute sigmoid diverticulitis -Continue IV antibiotics, currently receiving Rocephin and Flagyl. -Continue nothing by mouth status. -Appreciate surgical consultation.  Complex fluid collection right pelvic area -Per CT scan is 5 mm in size extending from the uterus towards the right and inferiorly, possible residual from ovarian cyst rupture or chronic hydrosalpinx. -As recommended by general surgery, will request inpatient GYN consult.  Hypertension -Well-controlled.  Depression -Celexa on hold given nothing by mouth for diverticulitis.  Code Status: Full code Family Communication: Husband at bedside updated on plan of care  Disposition Plan: Home when ready   Consultants:  Gen. surgery, Dr. Dalbert Batman   Antibiotics:  Rocephin  Metronidazole   Subjective: Abdominal pain is improved, still nauseated, has no appetite, denies chest pain or shortness of breath.  Objective: Filed Vitals:   10/12/14 2145 10/13/14 0557 10/13/14 1025 10/13/14 1335  BP: 147/62 99/50 118/69 90/53  Pulse: 87 62 77 69  Temp: 98 F (36.7 C) 97.4 F (36.3 C)  97.4 F (36.3 C)  TempSrc: Oral Oral  Oral  Resp: 18 18  18   Height:      Weight:      SpO2: 99% 95%  100%    Intake/Output Summary (Last 24 hours) at 10/13/14 1438 Last data filed at 10/13/14 0904  Gross per 24 hour  Intake    750 ml  Output      0 ml  Net    750 ml   Filed Weights   10/12/14 1429 10/12/14 1953  Weight: 106.595 kg (235 lb) 107.276 kg (236 lb 8 oz)    Exam:   General:  Alert, awake, oriented 3, no distress  Cardiovascular: Regular rate and rhythm, no murmurs, rubs or gallops  Respiratory: Clear to auscultation bilaterally  Abdomen: Soft, nontender, nondistended, positive bowel sounds  Extremities: No clubbing, cyanosis or edema, positive pulses   Neurologic:   Grossly intact and nonfocal  Data Reviewed: Basic Metabolic Panel:  Recent Labs Lab 10/12/14 1511 10/13/14 0516  NA 136 137  K 3.5 3.9  CL 105 107  CO2 22 23  GLUCOSE 113* 115*  BUN 18 11  CREATININE 0.65 0.60  CALCIUM 9.1 7.9*   Liver Function Tests:  Recent Labs Lab 10/12/14 1511 10/13/14 0516  AST 16 82*  ALT 17 55*  ALKPHOS 64 80  BILITOT 0.8 1.2  PROT 7.8 6.2*  ALBUMIN 3.6 2.9*    Recent Labs Lab 10/12/14 1511  LIPASE 23   No results for input(s): AMMONIA in the last 168 hours. CBC:  Recent Labs Lab 10/12/14 1511 10/13/14 0516  WBC 16.9* 15.0*  NEUTROABS 13.4*  --   HGB 11.9* 10.8*  HCT 36.3 34.2*  MCV 82.7 84.4  PLT 350 314   Cardiac Enzymes: No results for input(s): CKTOTAL, CKMB, CKMBINDEX, TROPONINI in the last 168 hours. BNP (last 3 results) No results for input(s): BNP in the last 8760 hours.  ProBNP (last 3 results) No results for input(s): PROBNP in the last 8760 hours.  CBG: No results for input(s): GLUCAP in the last 168 hours.  Recent Results (from the past 240 hour(s))  Wet prep, genital     Status: Abnormal   Collection Time: 10/12/14  3:56 PM  Result Value Ref Range Status   Yeast Wet Prep HPF POC NONE SEEN NONE SEEN Final   Trich, Wet Prep NONE SEEN NONE SEEN Final  Clue Cells Wet Prep HPF POC NONE SEEN NONE SEEN Final   WBC, Wet Prep HPF POC FEW (A) NONE SEEN Final     Studies: Ct Abdomen Pelvis W Contrast  10/12/2014   CLINICAL DATA:  Left lower quadrant pain for 3 days  EXAM: CT ABDOMEN AND PELVIS WITH CONTRAST  TECHNIQUE: Multidetector CT imaging of the abdomen and pelvis was performed using the standard protocol following bolus administration of intravenous contrast. Oral contrast was also administered.  CONTRAST:  135mL OMNIPAQUE IOHEXOL 300 MG/ML SOLN, 13mL OMNIPAQUE IOHEXOL 300 MG/ML SOLN  COMPARISON:  None.  FINDINGS: Lung bases are clear.  There is a small hiatal hernia.  No focal liver lesions are identified.  The gallbladder is absent. There is no biliary duct dilatation.  Spleen, pancreas, and adrenals appear normal. Kidneys bilaterally show no mass or hydronephrosis on either side. There is no renal or ureteral calculus on either side.  In the pelvis, the urinary bladder is midline with normal wall thickness. There is a mildly complex fluid collection in the right mid to lower pelvis located immediately to the right of the uterus and terminating inferiorly into a cystic structure anterior to the uterus in the lower pelvis. This fluid collection measures 12.2 x 10.1 x 6.1 cm. The cystic structure along the inferior aspect of this fluid collection measures 5.2 x 4.9 cm.  The appendix is prominent, measuring 9 mm in thickness. There is no surrounding periappendiceal mesenteric thickening. The appendix extends medially and terminates just lateral to the fluid collection in the right pelvis.  There are multiple sigmoid diverticula. In the distal descending colon extending to the descending colon -sigmoid junction, there is evidence of diverticulitis with moderate mesenteric inflammation in this area. There is irregular wall thickening in this area. There is a developing phlegmon within this area measuring 2.5 x 1.7 cm, best seen on axial slice 54 series 2. No perforation is identified.  There is no bowel obstruction. No free air or portal venous air. There is no adenopathy or ascites in the abdomen or pelvis. There is atherosclerotic change in the aorta but no aneurysm. There is postoperative change in the lower lumbar spine. There are no blastic or lytic bone lesions.  IMPRESSION: There is distal descending colon diverticulitis with early developing phlegmon within this area of diverticulitis. A well-formed abscess is not appreciable at this time. No perforation is seen. There is wall thickening and surrounding mesenteric inflammation focally in the left lateral lower abdomen -abdomen/pelvic junction. There are multiple  sigmoid and descending colonic diverticula.  In the right mid to lower pelvis, there is a slightly complex fluid collection which extends from slightly superior to the uterus toward the right and inferiorly, terminating at a cystic structure measuring 5.2 x 4.9 cm. The overall fluid collection measures 12.2 x 10.1 x 6.1 cm. Differential considerations for this fluid collection include loculated fluid from recent ovarian cyst leakage/rupture versus chronic hydrosalpinx. Note that the appendix is mildly prominent and is seen slightly lateral to this fluid collection. Conceivably, a chronic appendiceal rupture with fluid collection medially could have this appearance, although the appendix appears slightly separate from this fluid collection.  There is a small hiatal hernia. Gallbladder is absent. There is no renal or ureteral calculus. No hydronephrosis.  These results were called by telephone at the time of interpretation on 10/12/2014 at 5:58 pm to Dr. Ezequiel Essex , who verbally acknowledged these results.   Electronically Signed   By: Lowella Grip III  M.D.   On: 10/12/2014 17:58    Scheduled Meds: . cefTRIAXone (ROCEPHIN)  IV  2 g Intravenous Q24H  . citalopram  20 mg Oral QHS  . metoprolol tartrate  25 mg Oral BID  . metronidazole  500 mg Intravenous Q8H  . ondansetron  4 mg Intravenous Once   Continuous Infusions: . 0.9 % NaCl with KCl 20 mEq / L 1 mL (10/13/14 1200)    Active Problems:   Morbid obesity   HYPERTENSION, BENIGN ESSENTIAL   Diverticulitis    Time spent: 30 minutes. Greater than 50% of this time was spent in direct contact with the patient coordinating care.    Lelon Frohlich  [facility] Pager [phone]  If 7PM-7AM, please contact night-coverage at [email or web address], password [facility] 10/13/2014, 2:38 PM  LOS: 1 day

## 2014-10-13 NOTE — Progress Notes (Signed)
ANTICOAGULATION CONSULT NOTE - Initial Consult  Pharmacy Consult for Lovenox Indication: VTE prophylaxis  Allergies  Allergen Reactions  . Penicillins Shortness Of Breath    Pt tolerated ceftriaxone given in ED 10/12/14  . Azithromycin Swelling  . Cimetidine Other (See Comments)    unknown  . Conjugated Estrogens Other (See Comments)    emotional  . Cyclobenzaprine Hcl     REACTION: disorientation  . Dicyclomine Hcl Other (See Comments)    unknown  . Lisinopril     REACTION: RASH AND MOUTH SORE  . Norco [Hydrocodone-Acetaminophen] Other (See Comments)    Felt like she was going through withdrawal  . Paroxetine Other (See Comments)    Unknown  . Rosuvastatin Dermatitis  . Sulfa Antibiotics Hives and Itching  . Wellbutrin [Bupropion Hcl] Other (See Comments)    Emotional, makes depression worse  . Ciprofloxacin Itching and Rash  . Doxycycline Hyclate Itching and Rash  . Erythromycin Base Itching and Rash  . Prednisone Rash    Patient Measurements: Height: 5\' 5"  (165.1 cm) Weight: 236 lb 8 oz (107.276 kg) IBW/kg (Calculated) : 57  Vital Signs: Temp: 97.4 F (36.3 C) (06/20 0557) Temp Source: Oral (06/20 0557) BP: 118/69 mmHg (06/20 1025) Pulse Rate: 77 (06/20 1025)  Labs:  Recent Labs  10/12/14 1511 10/13/14 0516  HGB 11.9* 10.8*  HCT 36.3 34.2*  PLT 350 314  CREATININE 0.65 0.60    Estimated Creatinine Clearance: 102.4 mL/min (by C-G formula based on Cr of 0.6).   Medical History: Past Medical History  Diagnosis Date  . Anxiety   . GERD (gastroesophageal reflux disease)   . Hyperlipidemia   . Hypertension   . Wears glasses   . Depression   . Back pain     Medications:  Scheduled:  . cefTRIAXone (ROCEPHIN)  IV  2 g Intravenous Q24H  . citalopram  20 mg Oral QHS  . enoxaparin (LOVENOX) injection  40 mg Subcutaneous Daily  . metoprolol tartrate  25 mg Oral BID  . metronidazole  500 mg Intravenous Q8H  .  morphine injection  4 mg Intravenous  Once  . ondansetron  4 mg Intravenous Once    Assessment: 49 yo F admitted with abdominal pain and diverticulitis.  SQ heparin was ordered on admission, but patient has been refusing.  She is at high risk for VTE.  Risks for VTE explained to patient by surgeon and she verbalizes understanding of risk and recommendation for VTE prophylaxis while in the hospital to me.   She still adamantly refuses VTE px in form of injection because she is afraid of needles & states she will ambulate.   Signs & symptoms of DVT were discussed with patient & she verbalized understanding.  She is aware she could develop DVT while in hospital or even after discharge & verbalized appropriate actions to seek treatment.  Plan discussed with Dr Jerilee Hoh  Goal of Therapy:  VTE prevention Monitor platelets by anticoagulation protocol: Yes   Plan:  Patient refuses VTE px- ambulation only while in hospital.   Pharmacy to sign off- please re-consult if needed  Lovelace Cerveny, Lavonia Drafts 10/13/2014,12:56 PM

## 2014-10-13 NOTE — Consult Note (Addendum)
Reason for Consult:  Surgical consult to evaluate abdominal pain and diverticulitis  Referring Physician: [facility]  Cassandra Mcconnell is an 49 y.o. female.   HPI:      This is a 49 year old Caucasian female, admitted last night for management of her first episode of acute sigmoid diverticulitis.    She has never been treated for diverticulitis in the past.  She does state that she has a history of irritable bowel syndrome although has never had a colonoscopy and has never seen a gastroenterologist.  She reports intermittent diarrhea alternating with normal bowel movements chronically..  3 days ago, Thursday, June 16, she noted the gradual onset of left lower quadrant pain.  She says it became much worse the following day and by yesterday the pain was so severe she could not tolerate it and came to the emergency room.  She denies change in her bowel movements and states that she had a normal bowel movement today and continues to pass flatus.  Has never seen blood in her bowel movements.  She denies any voiding symptoms.  She denies nausea or vomiting until today she states she is nauseated today.  Otherwise, today she is alert, stable vital signs, does not appear toxic or septic.    Gynecologic history reveals 2 pregnancies and 2 miscarriages.  Last menstrual period was 2 weeks ago, on schedule with normal flow and duration was a little bit crampy.  She has not seen a gynecologist for 10 years.     CT scan last night shows diverticulitis of the distal descending and sigmoid colon with thickening and possible early phlegmon but no fluid collection or abscess.  No obstruction or free air.  Multiple diverticula of the sigmoid and descending colon.  Also in the right mid to lower pelvis there was a slightly complex fluid collection extending from the uterus toward the right and inferiorly.  This is thin-walled.  5 cm in size.  Looks like a chronic benign cyst morphologically.  Possible residual from  ovarian cyst rupture or chronic hydrosalpinx.  The appendix is mildly prominent but I do not think she has appendicitis.  Gallbladder is surgically absent.    Lab work shows WBC 16,900.  Repeat 15,000.  Hemoglobin 10.8.  AST and ALT mildly elevated, significance unknown.  Pregnancy test negative.  Urine shows lots of bacteria but no pyuria and negative nitrate.  Probably not infected.     Pelvic exam performed by the EDP was fairly unremarkable.     Comorbidities include morbid obesity, anxiety and depression, GERD, irritable bowel syndrome, hypertension, hyperlipidemia, history of lumbar fusion with chronic back pain, history of bilateral breast reduction mammoplasty this year, history cholecystectomy by Dr. Leafy Kindle 1998.          Social history reveals she is married.  Her husband and daughter are here.  She has 2 adopted children.  She denies tobacco or alcohol ever.  She is permanently disabled from back problems and back pain.     Past Medical History  Diagnosis Date  . Anxiety   . GERD (gastroesophageal reflux disease)   . Hyperlipidemia   . Hypertension   . Wears glasses   . Depression   . Back pain     Past Surgical History  Procedure Laterality Date  . Septoplasty  1988  . Tonsillectomy  1991  . Cholecystectomy      1998  . Back surgery  2004    l4/5 fusion  . Breast reduction surgery  Bilateral 08/08/2014    Procedure: BILATERAL BREAST REDUCTION  (BREAST);  Surgeon: Irene Limbo, MD;  Location: [facility];  Service: Plastics;  Laterality: Bilateral;    Family History  Problem Relation Age of Onset  . COPD Mother   . Hypertension Mother   . GER disease Mother   . Heart attack Father   . Cancer Father     prostate  . Hyperlipidemia Brother   . Hypertension Brother   . Heart attack Paternal Grandmother   . Heart attack Paternal Grandfather   . Stroke Paternal Grandfather   . Hyperlipidemia Brother   . Hypertension Brother     Social History:   reports that she has never smoked. She has never used smokeless tobacco. She reports that she does not drink alcohol or use illicit drugs.  Allergies:  Allergies  Allergen Reactions  . Penicillins Shortness Of Breath    Pt tolerated ceftriaxone given in ED 10/12/14  . Azithromycin Swelling  . Cimetidine Other (See Comments)    unknown  . Conjugated Estrogens Other (See Comments)    emotional  . Cyclobenzaprine Hcl     REACTION: disorientation  . Dicyclomine Hcl Other (See Comments)    unknown  . Lisinopril     REACTION: RASH AND MOUTH SORE  . Norco [Hydrocodone-Acetaminophen] Other (See Comments)    Felt like she was going through withdrawal  . Paroxetine Other (See Comments)    Unknown  . Rosuvastatin Dermatitis  . Sulfa Antibiotics Hives and Itching  . Wellbutrin [Bupropion Hcl] Other (See Comments)    Emotional, makes depression worse  . Ciprofloxacin Itching and Rash  . Doxycycline Hyclate Itching and Rash  . Erythromycin Base Itching and Rash  . Prednisone Rash    Medications: She states that she takes Celexa and Lopressor.  Results for orders placed or performed during the hospital encounter of 10/12/14 (from the past 48 hour(s))  CBC with Differential/Platelet     Status: Abnormal   Collection Time: 10/12/14  3:11 PM  Result Value Ref Range   WBC 16.9 (H) 4.0 - 10.5 K/uL   RBC 4.39 3.87 - 5.11 MIL/uL   Hemoglobin 11.9 (L) 12.0 - 15.0 g/dL   HCT 36.3 36.0 - 46.0 %   MCV 82.7 78.0 - 100.0 fL   MCH 27.1 26.0 - 34.0 pg   MCHC 32.8 30.0 - 36.0 g/dL   RDW 14.5 11.5 - 15.5 %   Platelets 350 150 - 400 K/uL   Neutrophils Relative % 79 (H) 43 - 77 %   Neutro Abs 13.4 (H) 1.7 - 7.7 K/uL   Lymphocytes Relative 13 12 - 46 %   Lymphs Abs 2.1 0.7 - 4.0 K/uL   Monocytes Relative 7 3 - 12 %   Monocytes Absolute 1.3 (H) 0.1 - 1.0 K/uL   Eosinophils Relative 1 0 - 5 %   Eosinophils Absolute 0.1 0.0 - 0.7 K/uL   Basophils Relative 0 0 - 1 %   Basophils Absolute 0.0 0.0 - 0.1  K/uL  Comprehensive metabolic panel     Status: Abnormal   Collection Time: 10/12/14  3:11 PM  Result Value Ref Range   Sodium 136 135 - 145 mmol/L   Potassium 3.5 3.5 - 5.1 mmol/L   Chloride 105 101 - 111 mmol/L   CO2 22 22 - 32 mmol/L   Glucose, Bld 113 (H) 65 - 99 mg/dL   BUN 18 6 - 20 mg/dL   Creatinine, Ser 0.65 0.44 -  1.00 mg/dL   Calcium 9.1 8.9 - 47.4 mg/dL   Total Protein 7.8 6.5 - 8.1 g/dL   Albumin 3.6 3.5 - 5.0 g/dL   AST 16 15 - 41 U/L   ALT 17 14 - 54 U/L   Alkaline Phosphatase 64 38 - 126 U/L   Total Bilirubin 0.8 0.3 - 1.2 mg/dL   GFR calc non Af Amer >60 >60 mL/min   GFR calc Af Amer >60 >60 mL/min    Comment: (NOTE) The eGFR has been calculated using the CKD EPI equation. This calculation has not been validated in all clinical situations. eGFR's persistently <60 mL/min signify possible Chronic Kidney Disease.    Anion gap 9 5 - 15  Lipase, blood     Status: None   Collection Time: 10/12/14  3:11 PM  Result Value Ref Range   Lipase 23 22 - 51 U/L  Urinalysis, Routine w reflex microscopic (not at [facility])     Status: Abnormal   Collection Time: 10/12/14  3:22 PM  Result Value Ref Range   Color, Urine YELLOW YELLOW   APPearance HAZY (A) CLEAR   Specific Gravity, Urine 1.020 1.005 - 1.030   pH 6.0 5.0 - 8.0   Glucose, UA NEGATIVE NEGATIVE mg/dL   Hgb urine dipstick TRACE (A) NEGATIVE   Bilirubin Urine NEGATIVE NEGATIVE   Ketones, ur NEGATIVE NEGATIVE mg/dL   Protein, ur NEGATIVE NEGATIVE mg/dL   Urobilinogen, UA 1.0 0.0 - 1.0 mg/dL   Nitrite NEGATIVE NEGATIVE   Leukocytes, UA NEGATIVE NEGATIVE  Pregnancy, urine     Status: None   Collection Time: 10/12/14  3:22 PM  Result Value Ref Range   Preg Test, Ur NEGATIVE NEGATIVE    Comment:        THE SENSITIVITY OF THIS METHODOLOGY IS >20 mIU/mL.   Urine microscopic-add on     Status: Abnormal   Collection Time: 10/12/14  3:22 PM  Result Value Ref Range   Squamous Epithelial / LPF MANY (A) RARE   WBC, UA  3-6 <3 WBC/hpf   RBC / HPF 0-2 <3 RBC/hpf   Bacteria, UA MANY (A) RARE  Wet prep, genital     Status: Abnormal   Collection Time: 10/12/14  3:56 PM  Result Value Ref Range   Yeast Wet Prep HPF POC NONE SEEN NONE SEEN   Trich, Wet Prep NONE SEEN NONE SEEN   Clue Cells Wet Prep HPF POC NONE SEEN NONE SEEN   WBC, Wet Prep HPF POC FEW (A) NONE SEEN  Comprehensive metabolic panel     Status: Abnormal   Collection Time: 10/13/14  5:16 AM  Result Value Ref Range   Sodium 137 135 - 145 mmol/L   Potassium 3.9 3.5 - 5.1 mmol/L   Chloride 107 101 - 111 mmol/L   CO2 23 22 - 32 mmol/L   Glucose, Bld 115 (H) 65 - 99 mg/dL   BUN 11 6 - 20 mg/dL   Creatinine, Ser 1.27 0.44 - 1.00 mg/dL   Calcium 7.9 (L) 8.9 - 10.3 mg/dL   Total Protein 6.2 (L) 6.5 - 8.1 g/dL   Albumin 2.9 (L) 3.5 - 5.0 g/dL   AST 82 (H) 15 - 41 U/L   ALT 55 (H) 14 - 54 U/L   Alkaline Phosphatase 80 38 - 126 U/L   Total Bilirubin 1.2 0.3 - 1.2 mg/dL   GFR calc non Af Amer >60 >60 mL/min   GFR calc Af Amer >60 >60 mL/min  Comment: (NOTE) The eGFR has been calculated using the CKD EPI equation. This calculation has not been validated in all clinical situations. eGFR's persistently <60 mL/min signify possible Chronic Kidney Disease.    Anion gap 7 5 - 15  CBC     Status: Abnormal   Collection Time: 10/13/14  5:16 AM  Result Value Ref Range   WBC 15.0 (H) 4.0 - 10.5 K/uL   RBC 4.05 3.87 - 5.11 MIL/uL   Hemoglobin 10.8 (L) 12.0 - 15.0 g/dL   HCT 34.2 (L) 36.0 - 46.0 %   MCV 84.4 78.0 - 100.0 fL   MCH 26.7 26.0 - 34.0 pg   MCHC 31.6 30.0 - 36.0 g/dL   RDW 14.6 11.5 - 15.5 %   Platelets 314 150 - 400 K/uL    Ct Abdomen Pelvis W Contrast  10/12/2014   CLINICAL DATA:  Left lower quadrant pain for 3 days  EXAM: CT ABDOMEN AND PELVIS WITH CONTRAST  TECHNIQUE: Multidetector CT imaging of the abdomen and pelvis was performed using the standard protocol following bolus administration of intravenous contrast. Oral contrast was  also administered.  CONTRAST:  154mL OMNIPAQUE IOHEXOL 300 MG/ML SOLN, 68mL OMNIPAQUE IOHEXOL 300 MG/ML SOLN  COMPARISON:  None.  FINDINGS: Lung bases are clear.  There is a small hiatal hernia.  No focal liver lesions are identified. The gallbladder is absent. There is no biliary duct dilatation.  Spleen, pancreas, and adrenals appear normal. Kidneys bilaterally show no mass or hydronephrosis on either side. There is no renal or ureteral calculus on either side.  In the pelvis, the urinary bladder is midline with normal wall thickness. There is a mildly complex fluid collection in the right mid to lower pelvis located immediately to the right of the uterus and terminating inferiorly into a cystic structure anterior to the uterus in the lower pelvis. This fluid collection measures 12.2 x 10.1 x 6.1 cm. The cystic structure along the inferior aspect of this fluid collection measures 5.2 x 4.9 cm.  The appendix is prominent, measuring 9 mm in thickness. There is no surrounding periappendiceal mesenteric thickening. The appendix extends medially and terminates just lateral to the fluid collection in the right pelvis.  There are multiple sigmoid diverticula. In the distal descending colon extending to the descending colon -sigmoid junction, there is evidence of diverticulitis with moderate mesenteric inflammation in this area. There is irregular wall thickening in this area. There is a developing phlegmon within this area measuring 2.5 x 1.7 cm, best seen on axial slice 54 series 2. No perforation is identified.  There is no bowel obstruction. No free air or portal venous air. There is no adenopathy or ascites in the abdomen or pelvis. There is atherosclerotic change in the aorta but no aneurysm. There is postoperative change in the lower lumbar spine. There are no blastic or lytic bone lesions.  IMPRESSION: There is distal descending colon diverticulitis with early developing phlegmon within this area of  diverticulitis. A well-formed abscess is not appreciable at this time. No perforation is seen. There is wall thickening and surrounding mesenteric inflammation focally in the left lateral lower abdomen -abdomen/pelvic junction. There are multiple sigmoid and descending colonic diverticula.  In the right mid to lower pelvis, there is a slightly complex fluid collection which extends from slightly superior to the uterus toward the right and inferiorly, terminating at a cystic structure measuring 5.2 x 4.9 cm. The overall fluid collection measures 12.2 x 10.1 x 6.1 cm. Differential considerations  for this fluid collection include loculated fluid from recent ovarian cyst leakage/rupture versus chronic hydrosalpinx. Note that the appendix is mildly prominent and is seen slightly lateral to this fluid collection. Conceivably, a chronic appendiceal rupture with fluid collection medially could have this appearance, although the appendix appears slightly separate from this fluid collection.  There is a small hiatal hernia. Gallbladder is absent. There is no renal or ureteral calculus. No hydronephrosis.  These results were called by telephone at the time of interpretation on 10/12/2014 at 5:58 pm to Dr. Ezequiel Essex , who verbally acknowledged these results.   Electronically Signed   By: Lowella Grip III M.D.   On: 10/12/2014 17:58    ROS Blood pressure 118/69, pulse 77, temperature 97.4 F (36.3 C), temperature source Oral, resp. rate 18, height $RemoveBe'5\' 5"'[id number]$  (1.651 m), weight 107.276 kg (236 lb 8 oz), last menstrual period 10/08/2014, SpO2 95 %. Physical Exam   General: Mildly obese.  Alert.  Cooperative.  Minimal distress.  Mental status normal. HEENT: Pupils reactive and equal.  Sclera clear.  Extraocular is intact.  His, oropharynx unremarkable. Neck: Good range of motion.  No adenopathy or mass.  No thyromegaly.  No jugular venous distention Chest: Lungs are clear to auscultation.  No chest wall tenderness.   Trachea midline Heart: Regular rate and rhythm.  No murmur.  No ectopy.  Radial pulses palpable Abdomen:  Tender with guarding left lower quadrant.  No mass.  Not distended.  She is slightly tender everywhere.  Bowel sounds are active. Activities: She moves all 4 cavities well.  No edema.  Calves nontender.  Homans sign negative. Neurologic: Alert and oriented 4.  No gross motor or sensory deficits.   Assessment/Plan:  Acute sigmoid diverticulitis.  First episode.  Developing phlegmon, but no complicating feature such as abscess, obstruction, or free air.     Recommend bowel rest, nothing by mouth except ice chips and meds     Broad-spectrum parenteral antibiotic.  Rocephin and Flagyl are a good choice.     repeat lab work tomorrow     If she does well, and pain and leukocytosis resolved, she can be managed as an outpatient with a 10-14 day course of antibiotic's , referral to a gastroenterologist, colonoscopy in about 2 months.      If she does not respond to medical therapy, it is possible that she may require colectomy and colostomy this admission.  She and her husband are devised of this.          Complex fluid collection in right pelvic area.  Etiology unclear.  This is thin-walled and appears benign and chronic.  Cannot completely exclude relationship to diverticulitis.      Advise vaginal pelvic ultrasound      Advise inpatient GYN consult  DVT prophylaxis.  She has initially refused pharmacologic prophylaxis.  I explained to her that obesity and acute infectious abdominal process increases her risk, even if she ambulates frequently.  I encouraged her to reconsider and she will reconsider.  Morbid obesity Depression, on Celexa Hypertension, on Lopressor  Disability secondary to chronic back pain, status post lumbar fusio Undocumented history of irritable bowel syndrome History laparoscopic cholecystectomy  Aniya Jolicoeur M 10/13/2014, 11:28 AM

## 2014-10-13 NOTE — Progress Notes (Signed)
UR chart review completed.  

## 2014-10-14 ENCOUNTER — Encounter: Payer: Self-pay | Admitting: Family Medicine

## 2014-10-14 DIAGNOSIS — K5732 Diverticulitis of large intestine without perforation or abscess without bleeding: Principal | ICD-10-CM

## 2014-10-14 LAB — COMPREHENSIVE METABOLIC PANEL
ALT: 42 U/L (ref 14–54)
ANION GAP: 10 (ref 5–15)
AST: 34 U/L (ref 15–41)
Albumin: 3 g/dL — ABNORMAL LOW (ref 3.5–5.0)
Alkaline Phosphatase: 73 U/L (ref 38–126)
BILIRUBIN TOTAL: 0.6 mg/dL (ref 0.3–1.2)
BUN: 9 mg/dL (ref 6–20)
CALCIUM: 8.2 mg/dL — AB (ref 8.9–10.3)
CHLORIDE: 108 mmol/L (ref 101–111)
CO2: 22 mmol/L (ref 22–32)
CREATININE: 0.62 mg/dL (ref 0.44–1.00)
GLUCOSE: 88 mg/dL (ref 65–99)
Potassium: 4 mmol/L (ref 3.5–5.1)
Sodium: 140 mmol/L (ref 135–145)
Total Protein: 6.6 g/dL (ref 6.5–8.1)

## 2014-10-14 LAB — CBC
HCT: 33.4 % — ABNORMAL LOW (ref 36.0–46.0)
HEMOGLOBIN: 10.5 g/dL — AB (ref 12.0–15.0)
MCH: 26.6 pg (ref 26.0–34.0)
MCHC: 31.4 g/dL (ref 30.0–36.0)
MCV: 84.6 fL (ref 78.0–100.0)
PLATELETS: 360 10*3/uL (ref 150–400)
RBC: 3.95 MIL/uL (ref 3.87–5.11)
RDW: 14.7 % (ref 11.5–15.5)
WBC: 12.7 10*3/uL — AB (ref 4.0–10.5)

## 2014-10-14 NOTE — Care Management Note (Signed)
Case Management Note  Patient Details  Name: Cassandra Mcconnell MRN: [id number] Date of Birth: [birth year]  Subjective/Objective:                  Pt admitted from home with diverticulitis. Pt lives with her husband and will return home at discharge. Pt is independent with ADL's.  Action/Plan: No CM needs noted.  Expected Discharge Date:                  Expected Discharge Plan:  Home/Self Care  In-House Referral:  NA  Discharge planning Services  CM Consult  Post Acute Care Choice:  NA Choice offered to:  NA  DME Arranged:    DME Agency:     HH Arranged:    HH Agency:     Status of Service:  Completed, signed off  Medicare Important Message Given:    Date Medicare IM Given:    Medicare IM give by:    Date Additional Medicare IM Given:    Additional Medicare Important Message give by:     If discussed at [place] of Stay Meetings, dates discussed:    Additional Comments:  Joylene Draft, RN 10/14/2014, 10:40 AM

## 2014-10-14 NOTE — Progress Notes (Signed)
Patient left AMA. IV cath removed and intact

## 2014-10-14 NOTE — Progress Notes (Signed)
Patient seen and examined today. Since her abdominal pain and nausea had improved overnight, plan was made to start her on clear liquids and advance as tolerated. Later in the day received a call from her nurse stating that she was very discontent with clear liquid diet. She threatened to leave AMA unless we gave her a regular diet without even tolerating clears. I explained to her the pathology of her disease and how we advance diet slowly. She became irate stating that the quality of the clear liquid diet was not "up to par". I called her on the phone and try to convince her to stay, however later received a call from her RN that she had decided to sign out AMA. No prescriptions were given.  Domingo Mend, MD [facility] Pager: [phone]

## 2014-10-15 ENCOUNTER — Encounter: Payer: Self-pay | Admitting: Family Medicine

## 2014-10-15 ENCOUNTER — Ambulatory Visit (INDEPENDENT_AMBULATORY_CARE_PROVIDER_SITE_OTHER): Payer: Commercial Managed Care - HMO | Admitting: Family Medicine

## 2014-10-15 VITALS — BP 132/70 | HR 67 | Temp 97.4°F | Wt 238.5 lb

## 2014-10-15 DIAGNOSIS — L309 Dermatitis, unspecified: Secondary | ICD-10-CM | POA: Diagnosis not present

## 2014-10-15 DIAGNOSIS — K5732 Diverticulitis of large intestine without perforation or abscess without bleeding: Secondary | ICD-10-CM | POA: Diagnosis not present

## 2014-10-15 DIAGNOSIS — N8320 Unspecified ovarian cysts: Secondary | ICD-10-CM | POA: Diagnosis not present

## 2014-10-15 DIAGNOSIS — N83299 Other ovarian cyst, unspecified side: Secondary | ICD-10-CM | POA: Insufficient documentation

## 2014-10-15 LAB — LIPID PANEL
CHOL/HDL RATIO: 5
CHOLESTEROL: 172 mg/dL (ref 0–200)
HDL: 31.9 mg/dL — ABNORMAL LOW (ref >39.00)
LDL CALC: 122 mg/dL — AB (ref 0–99)
NONHDL: 140.1
TRIGLYCERIDES: 90 mg/dL (ref 0.0–149.0)
VLDL: 18 mg/dL (ref 0.0–40.0)

## 2014-10-15 LAB — COMPREHENSIVE METABOLIC PANEL
ALBUMIN: 3.4 g/dL — AB (ref 3.5–5.2)
ALK PHOS: 70 U/L (ref 39–117)
ALT: 34 U/L (ref 0–35)
AST: 31 U/L (ref 0–37)
BUN: 8 mg/dL (ref 6–23)
CHLORIDE: 107 meq/L (ref 96–112)
CO2: 22 meq/L (ref 19–32)
Calcium: 9 mg/dL (ref 8.4–10.5)
Creatinine, Ser: 0.65 mg/dL (ref 0.40–1.20)
GFR: 102.47 mL/min (ref >60.00)
Glucose, Bld: 80 mg/dL (ref 70–99)
POTASSIUM: 4.1 meq/L (ref 3.5–5.1)
SODIUM: 136 meq/L (ref 135–145)
TOTAL PROTEIN: 6.6 g/dL (ref 6.0–8.3)
Total Bilirubin: 0.3 mg/dL (ref 0.2–1.2)

## 2014-10-15 LAB — CBC WITH DIFFERENTIAL/PLATELET
BASOS PCT: 0.5 % (ref 0.0–3.0)
Basophils Absolute: 0.1 10*3/uL (ref 0.0–0.1)
EOS PCT: 1.2 % (ref 0.0–5.0)
Eosinophils Absolute: 0.1 10*3/uL (ref 0.0–0.7)
HCT: 33.8 % — ABNORMAL LOW (ref 36.0–46.0)
Hemoglobin: 10.9 g/dL — ABNORMAL LOW (ref 12.0–15.0)
LYMPHS PCT: 19.4 % (ref 12.0–46.0)
Lymphs Abs: 2 10*3/uL (ref 0.7–4.0)
MCHC: 32.1 g/dL (ref 30.0–36.0)
MCV: 81.4 fl (ref 78.0–100.0)
MONOS PCT: 9.3 % (ref 3.0–12.0)
Monocytes Absolute: 1 10*3/uL (ref 0.1–1.0)
NEUTROS PCT: 69.6 % (ref 43.0–77.0)
Neutro Abs: 7.3 10*3/uL (ref 1.4–7.7)
PLATELETS: 360 10*3/uL (ref 150.0–400.0)
RBC: 4.15 Mil/uL (ref 3.87–5.11)
RDW: 15.3 % (ref 11.5–15.5)
WBC: 10.4 10*3/uL (ref 4.0–10.5)

## 2014-10-15 MED ORDER — MOXIFLOXACIN HCL 400 MG PO TABS: 400.0000 mg | ORAL_TABLET | Freq: Every day | ORAL | Status: DC

## 2014-10-15 MED ORDER — TRIAMCINOLONE ACETONIDE 0.1 % EX CREA: 1.0000 "application " | TOPICAL_CREAM | Freq: Two times a day (BID) | CUTANEOUS | Status: DC

## 2014-10-15 NOTE — Assessment & Plan Note (Addendum)
>  40 minutes spent in face to face time with patient, >50% spent in counselling or coordination of care. Reviewed studies with her along with her drug allergies. She reports that she is NOT allergic to cipro and therefore removed from her allergy list.  We are limited as to what abx we can prescribe her given her multiple drug allergies.  She did not receive sufficient abx since she left AMA. eRx sent for Avelox 400 mg daily x 7 days.  She will call her GYN regarding ovarian findings. Call or return to clinic prn if these symptoms worsen or fail to improve as anticipated. The patient indicates understanding of these issues and agrees with the plan.  Repeat CBC today. Orders Placed This Encounter  Procedures  . CBC with Differential/Platelet  . Comprehensive metabolic panel  . Lipid panel

## 2014-10-15 NOTE — Progress Notes (Signed)
Subjective:   Patient ID: Cassandra Mcconnell, female    DOB: [birth year], 49 y.o.   MRN: [id number]  Cassandra Mcconnell is a pleasant 49 y.o. year old female who presents to clinic today with Hospitalization Follow-up  on 10/15/2014  HPI:  Notes reviewed.  Admitted to [facility] on 6/19 with LLQ abdominal pain.  Acute sigmoid diverticulitis and ? Complex ovarian cyst found on CT- given IV abx, Rocephin and Flagyl.  Left AMA because she had "issues with the staff."  She left without any other abx.  Pain has improved.  No nausea or vomiting and she feels she is tolerating po ok.  No fevers.   Lab Results  Component Value Date   WBC 12.7* 10/14/2014   HGB 10.5* 10/14/2014   HCT 33.4* 10/14/2014   MCV 84.6 10/14/2014   PLT 360 10/14/2014    CLINICAL DATA: Left lower quadrant pain for 3 days  EXAM: CT ABDOMEN AND PELVIS WITH CONTRAST  TECHNIQUE: Multidetector CT imaging of the abdomen and pelvis was performed using the standard protocol following bolus administration of intravenous contrast. Oral contrast was also administered.  CONTRAST: 153mL OMNIPAQUE IOHEXOL 300 MG/ML SOLN, 76mL OMNIPAQUE IOHEXOL 300 MG/ML SOLN  COMPARISON: None.  FINDINGS: Lung bases are clear. There is a small hiatal hernia.  No focal liver lesions are identified. The gallbladder is absent. There is no biliary duct dilatation.  Spleen, pancreas, and adrenals appear normal. Kidneys bilaterally show no mass or hydronephrosis on either side. There is no renal or ureteral calculus on either side.  In the pelvis, the urinary bladder is midline with normal wall thickness. There is a mildly complex fluid collection in the right mid to lower pelvis located immediately to the right of the uterus and terminating inferiorly into a cystic structure anterior to the uterus in the lower pelvis. This fluid collection measures 12.2 x 10.1 x 6.1 cm. The cystic structure along the inferior aspect  of this fluid collection measures 5.2 x 4.9 cm.  The appendix is prominent, measuring 9 mm in thickness. There is no surrounding periappendiceal mesenteric thickening. The appendix extends medially and terminates just lateral to the fluid collection in the right pelvis.  There are multiple sigmoid diverticula. In the distal descending colon extending to the descending colon -sigmoid junction, there is evidence of diverticulitis with moderate mesenteric inflammation in this area. There is irregular wall thickening in this area. There is a developing phlegmon within this area measuring 2.5 x 1.7 cm, best seen on axial slice 54 series 2. No perforation is identified.  There is no bowel obstruction. No free air or portal venous air. There is no adenopathy or ascites in the abdomen or pelvis. There is atherosclerotic change in the aorta but no aneurysm. There is postoperative change in the lower lumbar spine. There are no blastic or lytic bone lesions.  IMPRESSION: There is distal descending colon diverticulitis with early developing phlegmon within this area of diverticulitis. A well-formed abscess is not appreciable at this time. No perforation is seen. There is wall thickening and surrounding mesenteric inflammation focally in the left lateral lower abdomen -abdomen/pelvic junction. There are multiple sigmoid and descending colonic diverticula.  In the right mid to lower pelvis, there is a slightly complex fluid collection which extends from slightly superior to the uterus toward the right and inferiorly, terminating at a cystic structure measuring 5.2 x 4.9 cm. The overall fluid collection measures 12.2 x 10.1 x 6.1 cm. Differential considerations for this  fluid collection include loculated fluid from recent ovarian cyst leakage/rupture versus chronic hydrosalpinx. Note that the appendix is mildly prominent and is seen slightly lateral to this fluid collection. Conceivably,  a chronic appendiceal rupture with fluid collection medially could have this appearance, although the appendix appears slightly separate from this fluid collection.  There is a small hiatal hernia. Gallbladder is absent. There is no renal or ureteral calculus. No hydronephrosis.  These results were called by telephone at the time of interpretation on 10/12/2014 at 5:58 pm to Dr. Ezequiel Essex , who verbally acknowledged these results.   Electronically Signed  By: Lowella Grip III M.D.  On: 10/12/2014 17:58    Current Outpatient Prescriptions on File Prior to Visit  Medication Sig Dispense Refill  . acetaminophen (TYLENOL) 500 MG tablet Take 1,000 mg by mouth every 6 (six) hours as needed. For pain     . citalopram (CELEXA) 20 MG tablet 1 tab by mouth twice daily (Patient taking differently: Take 40 mg by mouth at bedtime. ) 60 tablet 6  . ibuprofen (ADVIL,MOTRIN) 200 MG tablet Take 800 mg by mouth daily as needed. For cramps    . metoprolol tartrate (LOPRESSOR) 25 MG tablet Take 1 tablet (25 mg total) by mouth 2 (two) times daily. (Patient taking differently: Take 25 mg by mouth at bedtime. ) 60 tablet 6   No current facility-administered medications on file prior to visit.    Allergies  Allergen Reactions  . Penicillins Shortness Of Breath    Pt tolerated ceftriaxone given in ED 10/12/14  . Zofran [Ondansetron Hcl] Shortness Of Breath    Numbness,  . Azithromycin Swelling  . Cimetidine Other (See Comments)    unknown  . Conjugated Estrogens Other (See Comments)    emotional  . Cyclobenzaprine Hcl     REACTION: disorientation  . Dicyclomine Hcl Other (See Comments)    unknown  . Flagyl [Metronidazole] Nausea Only  . Lisinopril     REACTION: RASH AND MOUTH SORE  . Norco [Hydrocodone-Acetaminophen] Other (See Comments)    Felt like she was going through withdrawal  . Paroxetine Other (See Comments)    Unknown  . Percocet [Oxycodone-Acetaminophen]      Hypotension   . Rocephin [Ceftriaxone] Nausea Only  . Rosuvastatin Dermatitis  . Sulfa Antibiotics Hives and Itching  . Wellbutrin [Bupropion Hcl] Other (See Comments)    Emotional, makes depression worse  . Doxycycline Hyclate Itching and Rash  . Erythromycin Base Itching and Rash  . Prednisone Rash    Past Medical History  Diagnosis Date  . Anxiety   . GERD (gastroesophageal reflux disease)   . Hyperlipidemia   . Hypertension   . Wears glasses   . Depression   . Back pain     Past Surgical History  Procedure Laterality Date  . Septoplasty  1988  . Tonsillectomy  1991  . Cholecystectomy      1998  . Back surgery  2004    l4/5 fusion  . Breast reduction surgery Bilateral 08/08/2014    Procedure: BILATERAL BREAST REDUCTION  (BREAST);  Surgeon: Irene Limbo, MD;  Location: [place];  Service: Plastics;  Laterality: Bilateral;    Family History  Problem Relation Age of Onset  . COPD Mother   . Hypertension Mother   . GER disease Mother   . Heart attack Father   . Cancer Father     prostate  . Hyperlipidemia Brother   . Hypertension Brother   . Heart  attack Paternal Grandmother   . Heart attack Paternal Grandfather   . Stroke Paternal Grandfather   . Hyperlipidemia Brother   . Hypertension Brother     History   Social History  . Marital Status: Married    Spouse Name: N/A  . Number of Children: 1  . Years of Education: N/A   Occupational History  . stay at home mom    Social History Main Topics  . Smoking status: Never Smoker   . Smokeless tobacco: Never Used  . Alcohol Use: No  . Drug Use: No  . Sexual Activity: Not on file   Other Topics Concern  . Not on file   Social History Narrative   The PMH, PSH, Social History, Family History, Medications, and allergies have been reviewed in [facility], and have been updated if relevant.   Review of Systems  Constitutional: Negative.   HENT: Negative.   Eyes: Negative.   Respiratory:  Negative.   Cardiovascular: Negative.   Gastrointestinal: Negative.   Endocrine: Negative.   Genitourinary: Negative.   Musculoskeletal: Negative.   Skin: Negative.   Allergic/Immunologic: Negative.   Neurological: Negative.   Hematological: Negative.   Psychiatric/Behavioral: Negative.   All other systems reviewed and are negative.      Objective:    BP 132/70 mmHg  Pulse 67  Temp(Src) 97.4 F (36.3 C) (Oral)  Wt 238 lb 8 oz (108.183 kg)  SpO2 99%  LMP 10/01/2014   Physical Exam  Constitutional: She is oriented to person, place, and time. She appears well-developed and well-nourished. No distress.  HENT:  Head: Normocephalic.  Eyes: Conjunctivae are normal.  Neck: Normal range of motion.  Cardiovascular: Normal rate.   Pulmonary/Chest: Effort normal.  Abdominal: Soft. Bowel sounds are normal. She exhibits no distension and no mass. There is no tenderness. There is no rebound and no guarding.  Musculoskeletal: She exhibits no edema.  Neurological: She is alert and oriented to person, place, and time. No cranial nerve deficit.  Skin: Skin is warm and dry.  Psychiatric: She has a normal mood and affect. Her behavior is normal. Judgment and thought content normal.  Nursing note and vitals reviewed.         Assessment & Plan:   Diverticulitis of large intestine without perforation or abscess without bleeding - Plan: CBC with Differential/Platelet, Comprehensive metabolic panel, Lipid panel  Dermatitis - Plan: triamcinolone cream (KENALOG) 0.1 %  Complex ovarian cyst No Follow-up on file.

## 2014-10-15 NOTE — Patient Instructions (Signed)
Great to see you. Take Avelox as directed- 1 tablet daily for 7 days.  We will call you with your results.

## 2014-10-15 NOTE — Progress Notes (Signed)
Pre visit review using our clinic review tool, if applicable. No additional management support is needed unless otherwise documented below in the visit note. 

## 2014-10-16 ENCOUNTER — Encounter: Payer: Self-pay | Admitting: Family Medicine

## 2014-10-16 ENCOUNTER — Other Ambulatory Visit: Payer: Self-pay | Admitting: Family Medicine

## 2014-10-16 MED ORDER — FLUCONAZOLE 150 MG PO TABS: 150.0000 mg | ORAL_TABLET | Freq: Once | ORAL | Status: AC

## 2014-10-20 ENCOUNTER — Other Ambulatory Visit: Payer: Self-pay | Admitting: Family Medicine

## 2014-10-20 ENCOUNTER — Ambulatory Visit (INDEPENDENT_AMBULATORY_CARE_PROVIDER_SITE_OTHER): Payer: Commercial Managed Care - HMO | Admitting: Obstetrics & Gynecology

## 2014-10-20 ENCOUNTER — Encounter: Payer: Self-pay | Admitting: Obstetrics & Gynecology

## 2014-10-20 VITALS — BP 110/64 | HR 72 | Ht 65.0 in | Wt 234.0 lb

## 2014-10-20 DIAGNOSIS — C561 Malignant neoplasm of right ovary: Secondary | ICD-10-CM | POA: Diagnosis not present

## 2014-10-20 DIAGNOSIS — N8320 Unspecified ovarian cysts: Secondary | ICD-10-CM

## 2014-10-20 DIAGNOSIS — N83201 Unspecified ovarian cyst, right side: Secondary | ICD-10-CM

## 2014-10-20 MED ORDER — SIMVASTATIN 20 MG PO TABS: 20.0000 mg | ORAL_TABLET | Freq: Every day | ORAL | Status: DC

## 2014-10-20 NOTE — Progress Notes (Signed)
Patient ID: Cassandra Mcconnell, female   DOB: [birth year], 49 y.o.   MRN: [id number]   Chief Complaint  Patient presents with  . Follow-up    ER/ had CT SCAN-has cyst on rt ovary.     HPI:    49 y.o. No obstetric history on file. Patient's last menstrual period was 10/01/2014.   Location:  Lower pelvic. Quality:  sharp. Severity:  moerate. Timing:  Comes and goes. Duration:  Few minutes to longer. Context:  . Modifying factors:   Signs/Symptoms:      Current outpatient prescriptions:  .  acetaminophen (TYLENOL) 500 MG tablet, Take 1,000 mg by mouth every 6 (six) hours as needed. For pain , Disp: , Rfl:  .  citalopram (CELEXA) 20 MG tablet, 1 tab by mouth twice daily (Patient taking differently: Take 40 mg by mouth at bedtime. ), Disp: 60 tablet, Rfl: 6 .  docusate sodium (COLACE) 50 MG capsule, Take 50 mg by mouth daily as needed for mild constipation., Disp: , Rfl:  .  metoprolol tartrate (LOPRESSOR) 25 MG tablet, Take 1 tablet (25 mg total) by mouth 2 (two) times daily. (Patient taking differently: Take 25 mg by mouth at bedtime. ), Disp: 60 tablet, Rfl: 6 .  moxifloxacin (AVELOX) 400 MG tablet, Take 1 tablet (400 mg total) by mouth daily., Disp: 7 tablet, Rfl: 0 .  triamcinolone cream (KENALOG) 0.1 %, Apply 1 application topically 2 (two) times daily., Disp: 30 g, Rfl: 2 .  ibuprofen (ADVIL,MOTRIN) 200 MG tablet, Take 800 mg by mouth daily as needed. For cramps, Disp: , Rfl:  .  simvastatin (ZOCOR) 20 MG tablet, Take 1 tablet (20 mg total) by mouth at bedtime., Disp: 90 tablet, Rfl: 3  Problem Pertinent ROS:       No burning with urination, frequency or urgency No nausea, vomiting or diarrhea Nor fever chills or other constitutional symptoms   Extended ROS:        [place]:             Past Medical History  Diagnosis Date  . Anxiety   . GERD (gastroesophageal reflux disease)   . Hyperlipidemia   . Hypertension   . Wears glasses   . Depression   . Back pain   .  Diverticulitis     Past Surgical History  Procedure Laterality Date  . Septoplasty  1988  . Tonsillectomy  1991  . Cholecystectomy      1998  . Back surgery  2004    l4/5 fusion  . Breast reduction surgery Bilateral 08/08/2014    Procedure: BILATERAL BREAST REDUCTION  (BREAST);  Surgeon: Irene Limbo, MD;  Location: [place];  Service: Plastics;  Laterality: Bilateral;    OB History    No data available      Allergies  Allergen Reactions  . Penicillins Shortness Of Breath    Pt tolerated ceftriaxone given in ED 10/12/14  . Zofran [Ondansetron Hcl] Shortness Of Breath    Numbness,  . Azithromycin Swelling  . Cimetidine Other (See Comments)    unknown  . Conjugated Estrogens Other (See Comments)    emotional  . Cyclobenzaprine Hcl     REACTION: disorientation  . Dicyclomine Hcl Other (See Comments)    unknown  . Flagyl [Metronidazole] Nausea Only  . Lisinopril     REACTION: RASH AND MOUTH SORE  . Norco [Hydrocodone-Acetaminophen] Other (See Comments)    Felt like she was going through withdrawal  . Paroxetine Other (See  Comments)    Unknown  . Percocet [Oxycodone-Acetaminophen]     Hypotension   . Rocephin [Ceftriaxone] Nausea Only  . Rosuvastatin Dermatitis  . Sulfa Antibiotics Hives and Itching  . Wellbutrin [Bupropion Hcl] Other (See Comments)    Emotional, makes depression worse  . Doxycycline Hyclate Itching and Rash  . Erythromycin Base Itching and Rash  . Prednisone Rash    History   Social History  . Marital Status: Married    Spouse Name: N/A  . Number of Children: 1  . Years of Education: N/A   Occupational History  . stay at home mom    Social History Main Topics  . Smoking status: Never Smoker   . Smokeless tobacco: Never Used  . Alcohol Use: No  . Drug Use: No  . Sexual Activity: Not on file   Other Topics Concern  . None   Social History Narrative    Family History  Problem Relation Age of Onset  . COPD  Mother   . Hypertension Mother   . GER disease Mother   . Heart attack Father   . Cancer Father     prostate  . Hyperlipidemia Brother   . Hypertension Brother   . Diabetes Brother   . Heart attack Paternal Grandmother   . Heart attack Paternal Grandfather   . Stroke Paternal Grandfather   . Hyperlipidemia Brother   . Hypertension Brother      Examination:  Vitals:  Blood pressure 110/64, pulse 72, height 5\' 5"  (1.651 m), weight 234 lb (106.142 kg), last menstrual period 10/01/2014.    Physical Examination:     General Appearance:  awake, alert, oriented, in no acute distress  Vulva:  normal appearing vulva with no masses, tenderness or lesions Vagina:  normal mucosa, no discharge Cervix:  no cervical motion tenderness and no lesions Uterus:  normal size, contour, position, consistency, mobility, non-tender Adnexa: ovaries:present,  normal adnexa in size, nontender and no masses     DATA orders and reviews:  Lab tests were reviewed today:   ED Imaging studies were reviewed today:  ED  I did independently review/view images, tracing or specimen(not simply the report) myself.  Prescription Drug Management:  New Prescriptions:  Renewed Prescriptions:   Current prescription changes:     Impression/Plan(Problem Based): 1.  Hydrosalpinx vs recent cyst rupture         Follow Up:   3  months sonogram     Face to face time:  20 minutes  Greater than 50% of the visit time was spent in counseling and coordination of care with the patient.  The summary and outline of the counseling and care coordination is summarized in the note above.   All questions were answered.

## 2014-10-21 LAB — CA 125: CA 125: 17.2 U/mL (ref 0.0–38.1)

## 2015-01-03 ENCOUNTER — Emergency Department (HOSPITAL_COMMUNITY)
Admission: EM | Admit: 2015-01-03 | Discharge: 2015-01-03 | Discharge status: Against Medical Advice | Payer: Commercial Managed Care - HMO | Attending: Surgery | Admitting: Surgery

## 2015-01-03 ENCOUNTER — Encounter (HOSPITAL_COMMUNITY): Payer: Self-pay | Admitting: *Deleted

## 2015-01-03 DIAGNOSIS — F419 Anxiety disorder, unspecified: Secondary | ICD-10-CM | POA: Insufficient documentation

## 2015-01-03 DIAGNOSIS — R109 Unspecified abdominal pain: Secondary | ICD-10-CM | POA: Diagnosis present

## 2015-01-03 DIAGNOSIS — Z792 Long term (current) use of antibiotics: Secondary | ICD-10-CM | POA: Insufficient documentation

## 2015-01-03 DIAGNOSIS — Z973 Presence of spectacles and contact lenses: Secondary | ICD-10-CM | POA: Diagnosis not present

## 2015-01-03 DIAGNOSIS — K5792 Diverticulitis of intestine, part unspecified, without perforation or abscess without bleeding: Secondary | ICD-10-CM | POA: Diagnosis not present

## 2015-01-03 DIAGNOSIS — K5732 Diverticulitis of large intestine without perforation or abscess without bleeding: Secondary | ICD-10-CM | POA: Diagnosis not present

## 2015-01-03 DIAGNOSIS — Z8719 Personal history of other diseases of the digestive system: Secondary | ICD-10-CM | POA: Diagnosis not present

## 2015-01-03 DIAGNOSIS — Z880 Allergy status to penicillin: Secondary | ICD-10-CM | POA: Insufficient documentation

## 2015-01-03 DIAGNOSIS — Z79899 Other long term (current) drug therapy: Secondary | ICD-10-CM | POA: Diagnosis not present

## 2015-01-03 DIAGNOSIS — E785 Hyperlipidemia, unspecified: Secondary | ICD-10-CM | POA: Insufficient documentation

## 2015-01-03 DIAGNOSIS — I10 Essential (primary) hypertension: Secondary | ICD-10-CM | POA: Diagnosis not present

## 2015-01-03 DIAGNOSIS — F329 Major depressive disorder, single episode, unspecified: Secondary | ICD-10-CM | POA: Insufficient documentation

## 2015-01-03 LAB — COMPREHENSIVE METABOLIC PANEL
ALT: 19 U/L (ref 14–54)
AST: 20 U/L (ref 15–41)
Albumin: 3.5 g/dL (ref 3.5–5.0)
Alkaline Phosphatase: 62 U/L (ref 38–126)
Anion gap: 7 (ref 5–15)
BILIRUBIN TOTAL: 0.9 mg/dL (ref 0.3–1.2)
BUN: 11 mg/dL (ref 6–20)
CO2: 20 mmol/L — ABNORMAL LOW (ref 22–32)
Calcium: 9.4 mg/dL (ref 8.9–10.3)
Chloride: 109 mmol/L (ref 101–111)
Creatinine, Ser: 0.72 mg/dL (ref 0.44–1.00)
GFR calc non Af Amer: >60 mL/min (ref >60)
Glucose, Bld: 99 mg/dL (ref 65–99)
POTASSIUM: 3.9 mmol/L (ref 3.5–5.1)
Sodium: 136 mmol/L (ref 135–145)
TOTAL PROTEIN: 6.6 g/dL (ref 6.5–8.1)

## 2015-01-03 LAB — CBC
HCT: 38.5 % (ref 36.0–46.0)
HEMOGLOBIN: 12.7 g/dL (ref 12.0–15.0)
MCH: 27.5 pg (ref 26.0–34.0)
MCHC: 33 g/dL (ref 30.0–36.0)
MCV: 83.5 fL (ref 78.0–100.0)
Platelets: 263 10*3/uL (ref 150–400)
RBC: 4.61 MIL/uL (ref 3.87–5.11)
RDW: 16 % — AB (ref 11.5–15.5)
WBC: 12.9 10*3/uL — ABNORMAL HIGH (ref 4.0–10.5)

## 2015-01-03 LAB — I-STAT BETA HCG BLOOD, ED (MC, WL, AP ONLY): I-stat hCG, quantitative: <5 m[IU]/mL (ref <5)

## 2015-01-03 LAB — LIPASE, BLOOD: Lipase: 23 U/L (ref 22–51)

## 2015-01-03 NOTE — ED Notes (Addendum)
Pt reports onset yesterday of LLQ pain, nausea and diarrhea. Denies vomiting, blood in stools or fever. Hx of diverticulitis.

## 2015-01-03 NOTE — ED Provider Notes (Signed)
CSN: [id number]     Arrival date & time 01/03/15  1347 History   First MD Initiated Contact with Patient 01/03/15 1907     Chief Complaint  Patient presents with  . Abdominal Pain     (Consider location/radiation/quality/duration/timing/severity/associated sxs/prior Treatment) Patient is a 49 y.o. female presenting with abdominal pain.  Abdominal Pain Pain location:  LLQ Pain quality: sharp   Pain radiates to:  Does not radiate Pain severity:  Severe Onset quality:  Gradual Duration:  1 day Timing:  Constant Progression:  Worsening Chronicity:  Recurrent Context: not trauma   Relieved by:  None tried Worsened by:  Palpation Ineffective treatments:  None tried Associated symptoms: diarrhea and nausea   Associated symptoms: no chest pain, no chills, no constipation, no cough, no dysuria, no fever, no shortness of breath, no sore throat and no vomiting   Risk factors: multiple surgeries     Past Medical History  Diagnosis Date  . Anxiety   . GERD (gastroesophageal reflux disease)   . Hyperlipidemia   . Hypertension   . Wears glasses   . Depression   . Back pain   . Diverticulitis    Past Surgical History  Procedure Laterality Date  . Septoplasty  1988  . Tonsillectomy  1991  . Cholecystectomy      1998  . Back surgery  2004    l4/5 fusion  . Breast reduction surgery Bilateral 08/08/2014    Procedure: BILATERAL BREAST REDUCTION  (BREAST);  Surgeon: Irene Limbo, MD;  Location: [place];  Service: Plastics;  Laterality: Bilateral;   Family History  Problem Relation Age of Onset  . COPD Mother   . Hypertension Mother   . GER disease Mother   . Heart attack Father   . Cancer Father     prostate  . Hyperlipidemia Brother   . Hypertension Brother   . Diabetes Brother   . Heart attack Paternal Grandmother   . Heart attack Paternal Grandfather   . Stroke Paternal Grandfather   . Hyperlipidemia Brother   . Hypertension Brother    Social  History  Substance Use Topics  . Smoking status: Never Smoker   . Smokeless tobacco: Never Used  . Alcohol Use: No   OB History    No data available     Review of Systems  Constitutional: Negative for fever and chills.  HENT: Negative for congestion and sore throat.   Eyes: Negative for visual disturbance.  Respiratory: Negative for cough, shortness of breath and wheezing.   Cardiovascular: Negative for chest pain.  Gastrointestinal: Positive for nausea, abdominal pain and diarrhea. Negative for vomiting, constipation and blood in stool.  Genitourinary: Negative for dysuria, difficulty urinating and vaginal pain.  Musculoskeletal: Negative for myalgias and arthralgias.  Skin: Negative for rash.  Neurological: Negative for syncope and headaches.  Psychiatric/Behavioral: Negative for behavioral problems.  All other systems reviewed and are negative.     Allergies  Penicillins; Zofran; Azithromycin; Cimetidine; Conjugated estrogens; Cyclobenzaprine hcl; Dicyclomine hcl; Flagyl; Lisinopril; Norco; Paroxetine; Percocet; Rocephin; Rosuvastatin; Sulfa antibiotics; Wellbutrin; Doxycycline hyclate; Erythromycin base; and Prednisone  Home Medications   Prior to Admission medications   Medication Sig Start Date End Date Taking? Authorizing Provider  acetaminophen (TYLENOL) 500 MG tablet Take 1,000 mg by mouth every 6 (six) hours as needed. For pain     Historical Provider, MD  citalopram (CELEXA) 20 MG tablet 1 tab by mouth twice daily Patient taking differently: Take 40 mg by mouth at  bedtime.  07/23/14   Lucille Passy, MD  docusate sodium (COLACE) 50 MG capsule Take 50 mg by mouth daily as needed for mild constipation.    Historical Provider, MD  ibuprofen (ADVIL,MOTRIN) 200 MG tablet Take 800 mg by mouth daily as needed. For cramps    Historical Provider, MD  metoprolol tartrate (LOPRESSOR) 25 MG tablet Take 1 tablet (25 mg total) by mouth 2 (two) times daily. Patient taking  differently: Take 25 mg by mouth at bedtime.  06/23/14   Lucille Passy, MD  moxifloxacin (AVELOX) 400 MG tablet Take 1 tablet (400 mg total) by mouth daily. 10/15/14   Lucille Passy, MD  simvastatin (ZOCOR) 20 MG tablet Take 1 tablet (20 mg total) by mouth at bedtime. 10/20/14   Lucille Passy, MD  triamcinolone cream (KENALOG) 0.1 % Apply 1 application topically 2 (two) times daily. 10/15/14   Lucille Passy, MD   BP 120/65 mmHg  Pulse 71  Temp(Src) 98.2 F (36.8 C) (Oral)  Resp 18  Ht 5\' 6"  (1.676 m)  Wt 236 lb 6.4 oz (107.23 kg)  BMI 38.17 kg/m2  SpO2 100%  LMP 12/03/2014 Physical Exam  Constitutional: She is oriented to person, place, and time. She appears well-developed and well-nourished. No distress.  HENT:  Head: Normocephalic and atraumatic.  Eyes: EOM are normal.  Neck: Normal range of motion.  Cardiovascular: Normal rate, regular rhythm and normal heart sounds.   No murmur heard. Pulmonary/Chest: Effort normal and breath sounds normal. No respiratory distress. She has no wheezes.  Abdominal: Soft. Bowel sounds are normal. She exhibits no distension. There is tenderness.  Musculoskeletal: She exhibits no edema.  Neurological: She is alert and oriented to person, place, and time.  Skin: She is not diaphoretic.  Psychiatric: She has a normal mood and affect. Her behavior is normal.    ED Course  Procedures (including critical care time) Labs Review Labs Reviewed  COMPREHENSIVE METABOLIC PANEL - Abnormal; Notable for the following:    CO2 20 (*)    All other components within normal limits  CBC - Abnormal; Notable for the following:    WBC 12.9 (*)    RDW 16.0 (*)    All other components within normal limits  LIPASE, BLOOD  URINALYSIS, ROUTINE W REFLEX MICROSCOPIC (NOT AT [facility])  I-STAT BETA HCG BLOOD, ED ([facility], [facility], [facility] ONLY)    Imaging Review No results found. I have personally reviewed and evaluated these images and lab results as part of my medical decision-making.    EKG Interpretation None      MDM   Final diagnoses:  Diverticulitis of intestine without perforation or abscess without bleeding     Patient is a 49 year old female with a history of diverticulitis that presents with one day of left lower abdominal pain. Patient states exactly like previous diverticulitis flares. She is usually able to do by mouth and boxes outpatient. Patient has been slightly nauseated with no vomiting and no fever. On arrival to the ED the patient is afebrile stable vital signs. Patient is in no acute distress. Patient does have tenderness in her left lower quadrant however there is no signs of peritonitis. Patient is currently refusing an IV. Patient's workup significant for leukocytosis. Given the patient's symptoms and history of diverticulitis it is felt that a CT is not indicated. Patient's became upset with a wait and was unwilling to wait for a prescription for antibiotics and told nursing staff that she would just get  her PCP and get them prescribed. Patient left AMA.    Renne Musca, MD 01/03/15 [place]  Leonard Schwartz, MD 01/11/15 [phone]

## 2015-01-03 NOTE — ED Notes (Signed)
Secretary notified RN that patient called out due to be upset about provider. RN was with other patient and informed her she would be in as soon as possible. Patient and family were visualized leaving ER by staff patient denies needs.

## 2015-01-03 NOTE — ED Notes (Addendum)
Patient refusing to get into gown or have IV started or provide UA. Pt states she is only here because husband made her come and hates hospital because we try to kill her. RN apologetic for patients past experience and explained patients rights. Patient aggreable to have blood pressure cuff and pulse ox. And family given snacks and drinks.

## 2015-01-04 ENCOUNTER — Encounter: Payer: Self-pay | Admitting: Family Medicine

## 2015-01-07 ENCOUNTER — Telehealth (HOSPITAL_BASED_OUTPATIENT_CLINIC_OR_DEPARTMENT_OTHER): Payer: Self-pay | Admitting: Emergency Medicine

## 2015-01-19 ENCOUNTER — Other Ambulatory Visit: Payer: Commercial Managed Care - HMO

## 2015-01-19 ENCOUNTER — Ambulatory Visit: Payer: Commercial Managed Care - HMO | Admitting: Obstetrics & Gynecology

## 2015-01-22 ENCOUNTER — Ambulatory Visit: Payer: Self-pay | Admitting: Internal Medicine

## 2015-01-22 ENCOUNTER — Other Ambulatory Visit: Payer: Commercial Managed Care - HMO

## 2015-01-22 ENCOUNTER — Ambulatory Visit: Payer: Commercial Managed Care - HMO | Admitting: Obstetrics & Gynecology

## 2015-02-18 ENCOUNTER — Ambulatory Visit (INDEPENDENT_AMBULATORY_CARE_PROVIDER_SITE_OTHER): Payer: Commercial Managed Care - HMO | Admitting: Family Medicine

## 2015-02-18 ENCOUNTER — Encounter: Payer: Self-pay | Admitting: Family Medicine

## 2015-02-18 VITALS — BP 134/70 | HR 69 | Temp 97.9°F | Wt 239.8 lb

## 2015-02-18 DIAGNOSIS — L918 Other hypertrophic disorders of the skin: Secondary | ICD-10-CM | POA: Insufficient documentation

## 2015-02-18 NOTE — Progress Notes (Signed)
S: The patient complains of symptomatic skin tags on the arms and upper thighs bilaterally. These are irritated by clothing, jewelry and rubbing.  Current Outpatient Prescriptions on File Prior to Visit  Medication Sig Dispense Refill  . acetaminophen (TYLENOL) 500 MG tablet Take 1,000 mg by mouth every 6 (six) hours as needed. For pain     . citalopram (CELEXA) 20 MG tablet 1 tab by mouth twice daily (Patient taking differently: Take 40 mg by mouth at bedtime. ) 60 tablet 6  . docusate sodium (COLACE) 50 MG capsule Take 50 mg by mouth daily as needed for mild constipation.    Marland Kitchen ibuprofen (ADVIL,MOTRIN) 200 MG tablet Take 800 mg by mouth daily as needed. For cramps    . metoprolol tartrate (LOPRESSOR) 25 MG tablet Take 1 tablet (25 mg total) by mouth 2 (two) times daily. (Patient taking differently: Take 25 mg by mouth at bedtime. ) 60 tablet 6  . moxifloxacin (AVELOX) 400 MG tablet Take 1 tablet (400 mg total) by mouth daily. 7 tablet 0  . simvastatin (ZOCOR) 20 MG tablet Take 1 tablet (20 mg total) by mouth at bedtime. 90 tablet 3  . triamcinolone cream (KENALOG) 0.1 % Apply 1 application topically 2 (two) times daily. 30 g 2   No current facility-administered medications on file prior to visit.    Allergies  Allergen Reactions  . Penicillins Shortness Of Breath    Pt tolerated ceftriaxone given in ED 10/12/14  . Zofran [Ondansetron Hcl] Shortness Of Breath    Numbness,  . Azithromycin Swelling  . Cimetidine Other (See Comments)    unknown  . Conjugated Estrogens Other (See Comments)    emotional  . Cyclobenzaprine Hcl     REACTION: disorientation  . Dicyclomine Hcl Other (See Comments)    unknown  . Flagyl [Metronidazole] Nausea Only  . Lisinopril     REACTION: RASH AND MOUTH SORE  . Norco [Hydrocodone-Acetaminophen] Other (See Comments)    Felt like she was going through withdrawal  . Paroxetine Other (See Comments)    Unknown  . Percocet [Oxycodone-Acetaminophen]    Hypotension   . Rocephin [Ceftriaxone] Nausea Only  . Rosuvastatin Dermatitis  . Sulfa Antibiotics Hives and Itching  . Wellbutrin [Bupropion Hcl] Other (See Comments)    Emotional, makes depression worse  . Doxycycline Hyclate Itching and Rash  . Erythromycin Base Itching and Rash  . Prednisone Rash    Past Medical History  Diagnosis Date  . Anxiety   . GERD (gastroesophageal reflux disease)   . Hyperlipidemia   . Hypertension   . Wears glasses   . Depression   . Back pain   . Diverticulitis     Past Surgical History  Procedure Laterality Date  . Septoplasty  1988  . Tonsillectomy  1991  . Cholecystectomy      1998  . Back surgery  2004    l4/5 fusion  . Breast reduction surgery Bilateral 08/08/2014    Procedure: BILATERAL BREAST REDUCTION  (BREAST);  Surgeon: Irene Limbo, MD;  Location: [facility];  Service: Plastics;  Laterality: Bilateral;    Family History  Problem Relation Age of Onset  . COPD Mother   . Hypertension Mother   . GER disease Mother   . Heart attack Father   . Cancer Father     prostate  . Hyperlipidemia Brother   . Hypertension Brother   . Diabetes Brother   . Heart attack Paternal Grandmother   . Heart  attack Paternal Grandfather   . Stroke Paternal Grandfather   . Hyperlipidemia Brother   . Hypertension Brother     Social History   Social History  . Marital Status: Married    Spouse Name: N/A  . Number of Children: 1  . Years of Education: N/A   Occupational History  . stay at home mom    Social History Main Topics  . Smoking status: Never Smoker   . Smokeless tobacco: Never Used  . Alcohol Use: No  . Drug Use: No  . Sexual Activity: Not on file   Other Topics Concern  . Not on file   Social History Narrative   The PMH, PSH, Social History, Family History, Medications, and allergies have been reviewed in [facility], and have been updated if relevant.  O: BP 134/70 mmHg  Pulse 69  Temp(Src) 97.9 F  (36.6 C) (Oral)  Wt 239 lb 12 oz (108.75 kg)  SpO2 98%  LMP 02/10/2015   Patient appears well. Several benign skin tags are noted on the upper arms and upper thighs bilaterally  A: Skin tags   P: Skin tags are snipped off using Betadine for cleansing and sterile iris scissors. Local anesthesia was  used. These pathognomonic lesions are not sent for pathology.

## 2015-02-18 NOTE — Progress Notes (Signed)
Pre visit review using our clinic review tool, if applicable. No additional management support is needed unless otherwise documented below in the visit note. 

## 2015-02-26 ENCOUNTER — Encounter: Payer: Self-pay | Admitting: Obstetrics & Gynecology

## 2015-02-26 ENCOUNTER — Ambulatory Visit (INDEPENDENT_AMBULATORY_CARE_PROVIDER_SITE_OTHER): Payer: Commercial Managed Care - HMO

## 2015-02-26 ENCOUNTER — Ambulatory Visit (INDEPENDENT_AMBULATORY_CARE_PROVIDER_SITE_OTHER): Payer: Commercial Managed Care - HMO | Admitting: Obstetrics & Gynecology

## 2015-02-26 VITALS — BP 110/70 | HR 62 | Ht 65.0 in | Wt 239.0 lb

## 2015-02-26 DIAGNOSIS — N9419 Other specified dyspareunia: Secondary | ICD-10-CM

## 2015-02-26 DIAGNOSIS — R1031 Right lower quadrant pain: Secondary | ICD-10-CM

## 2015-02-26 DIAGNOSIS — N83201 Unspecified ovarian cyst, right side: Secondary | ICD-10-CM

## 2015-02-26 NOTE — Progress Notes (Signed)
Patient ID: Cassandra Mcconnell, female   DOB: 19-Mar-1966, 49 y.o.   MRN: [id number] Follow up appointment for results  Chief Complaint  Patient presents with  . Follow-up    ultrasound today.    Blood pressure 110/70, pulse 62, height 5\' 5"  (1.651 m), weight 239 lb (108.41 kg), last menstrual period 02/10/2015.  US Transvaginal Non-ob  02/26/2015  GYNECOLOGIC SONOGRAM Cassandra Mcconnell is a 49 y.o.LMP 02/10/2015 for a pelvic sonogram for f/u rt adnexal fluid collection and cyst seen on CT. Uterus                      7.1 x 5.3 x 5.1 cm, :retroverted uterus with a 1.5 x 1.9 x 1.8 cm fundal fibroid Endometrium          7.7 mm, symmetrical, wnl Right ovary             3.8 x 2.3 x 2.9 cm, ,normal rt ov with a 5.9 x 4.8 x 4.4 cm simple cyst adjacent to rt ov,rt ov does NOT appear to be mobile Left ovary                2.8 x 2 x 2.4 cm, wnl Mult small nabothian simple cysts Technician Comments: PELVIS TA/TV :retroverted uterus with a 1.5 x 1.9 x 1.8 cm fundal fibroid ,EEC 7.69mm,normal lt ov (mobile),normal rt ov with a 5.9 x 4.8 x 4.4 cm simple cyst adjacent to rt ov,rt ov does NOT appear to be mobile,no free fluid,rt adnexal pain during ultrasound [employer] 02/26/2015 9:18 AM Clinical Impression and recommendations: I have reviewed the sonogram results above, combined with the patient's current clinical course, below are my impressions and any appropriate recommendations for management based on the sonographic findings. Right ovarian/paratubal/hydrosalpinx is stable appears simple benign. No other associated concerning findings Uterus is normal and normal endometrium in a menstruating woman Left ovary is normal EURE,LUTHER H 02/26/2015 10:54 AM   US Pelvis Complete  02/26/2015  GYNECOLOGIC SONOGRAM Cassandra Mcconnell is a 49 y.o.LMP 02/10/2015 for a pelvic sonogram for f/u rt adnexal fluid collection and cyst seen on CT. Uterus                      7.1 x 5.3 x 5.1 cm, :retroverted uterus with a 1.5 x 1.9 x 1.8 cm  fundal fibroid Endometrium          7.7 mm, symmetrical, wnl Right ovary             3.8 x 2.3 x 2.9 cm, ,normal rt ov with a 5.9 x 4.8 x 4.4 cm simple cyst adjacent to rt ov,rt ov does NOT appear to be mobile Left ovary                2.8 x 2 x 2.4 cm, wnl Mult small nabothian simple cysts Technician Comments: PELVIS TA/TV :retroverted uterus with a 1.5 x 1.9 x 1.8 cm fundal fibroid ,EEC 7.39mm,normal lt ov (mobile),normal rt ov with a 5.9 x 4.8 x 4.4 cm simple cyst adjacent to rt ov,rt ov does NOT appear to be mobile,no free fluid,rt adnexal pain during ultrasound [employer] 02/26/2015 9:18 AM Clinical Impression and recommendations: I have reviewed the sonogram results above, combined with the patient's current clinical course, below are my impressions and any appropriate recommendations for management based on the sonographic findings. Right ovarian/paratubal/hydrosalpinx is stable appears simple benign. No other associated concerning findings Uterus  is normal and normal endometrium in a menstruating woman Left ovary is normal EURE,LUTHER H 02/26/2015 10:54 AM    Pt has been having increasing pain in the RLQ, most notably with intercourse but also some pain at other times Today during exam very tender with scan on the right side of the mass  MEDS ordered this encounter: Meds ordered this encounter  Medications  . metoprolol tartrate (LOPRESSOR) 25 MG tablet    Sig: Take 25 mg by mouth 2 (two) times daily.  Marland Kitchen omeprazole (PRILOSEC) 20 MG capsule    Sig: Take 20 mg by mouth daily.    Orders for this encounter: No orders of the defined types were placed in this encounter.    Plan:  Pt wants to proceed with removal and wants to have laparoscopic BSO given her age and desire to prevent ovarian cancer in the future She understands this will cause immediate menopause and she may require/desire HRT for some time after surgery  Follow Up:  Decision for surgery today:  November  16,2016  Laparoscopic BSO  Preoperative History and Physical  Cassandra Mcconnell is a 49 y.o. No obstetric history on file. with Patient's last menstrual period was 02/10/2015. admitted for a laparoscopic BSO.  See above note for details  PMH:    Past Medical History  Diagnosis Date  . Anxiety   . GERD (gastroesophageal reflux disease)   . Hyperlipidemia   . Hypertension   . Wears glasses   . Depression   . Back pain   . Diverticulitis     PSH:     Past Surgical History  Procedure Laterality Date  . Septoplasty  1988  . Tonsillectomy  1991  . Cholecystectomy      1998  . Back surgery  2004    l4/5 fusion  . Breast reduction surgery Bilateral 08/08/2014    Procedure: BILATERAL BREAST REDUCTION  (BREAST);  Surgeon: Irene Limbo, MD;  Location: [facility];  Service: Plastics;  Laterality: Bilateral;    POb/GynH:      OB History    No data available      SH:   Social History  Substance Use Topics  . Smoking status: Never Smoker   . Smokeless tobacco: Never Used  . Alcohol Use: No    FH:    Family History  Problem Relation Age of Onset  . COPD Mother   . Hypertension Mother   . GER disease Mother   . Heart attack Father   . Cancer Father     prostate  . Hyperlipidemia Brother   . Hypertension Brother   . Diabetes Brother   . Heart attack Paternal Grandmother   . Heart attack Paternal Grandfather   . Stroke Paternal Grandfather   . Hyperlipidemia Brother   . Hypertension Brother      Allergies:  Allergies  Allergen Reactions  . Penicillins Shortness Of Breath    Pt tolerated ceftriaxone given in ED 10/12/14  . Zofran [Ondansetron Hcl] Shortness Of Breath    Numbness,  . Azithromycin Swelling  . Cimetidine Other (See Comments)    unknown  . Conjugated Estrogens Other (See Comments)    emotional  . Cyclobenzaprine Hcl     REACTION: disorientation  . Dicyclomine Hcl Other (See Comments)    unknown  . Flagyl [Metronidazole]  Nausea Only  . Lisinopril     REACTION: RASH AND MOUTH SORE  . Norco [Hydrocodone-Acetaminophen] Other (See Comments)    Felt like  she was going through withdrawal  . Paroxetine Other (See Comments)    Unknown  . Percocet [Oxycodone-Acetaminophen]     Hypotension   . Rocephin [Ceftriaxone] Nausea Only  . Rosuvastatin Dermatitis  . Sulfa Antibiotics Hives and Itching  . Wellbutrin [Bupropion Hcl] Other (See Comments)    Emotional, makes depression worse  . Doxycycline Hyclate Itching and Rash  . Erythromycin Base Itching and Rash  . Prednisone Rash    Medications:       Current outpatient prescriptions:  .  acetaminophen (TYLENOL) 500 MG tablet, Take 1,000 mg by mouth every 6 (six) hours as needed. For pain , Disp: , Rfl:  .  citalopram (CELEXA) 20 MG tablet, 1 tab by mouth twice daily (Patient taking differently: Take 40 mg by mouth at bedtime. ), Disp: 60 tablet, Rfl: 6 .  docusate sodium (COLACE) 50 MG capsule, Take 50 mg by mouth daily as needed for mild constipation., Disp: , Rfl:  .  ibuprofen (ADVIL,MOTRIN) 200 MG tablet, Take 800 mg by mouth daily as needed. For cramps, Disp: , Rfl:  .  metoprolol tartrate (LOPRESSOR) 25 MG tablet, Take 25 mg by mouth 2 (two) times daily., Disp: , Rfl:  .  omeprazole (PRILOSEC) 20 MG capsule, Take 20 mg by mouth daily., Disp: , Rfl:  .  simvastatin (ZOCOR) 20 MG tablet, Take 1 tablet (20 mg total) by mouth at bedtime., Disp: 90 tablet, Rfl: 3 .  triamcinolone cream (KENALOG) 0.1 %, Apply 1 application topically 2 (two) times daily., Disp: 30 g, Rfl: 2  Review of Systems:   Review of Systems  Constitutional: Negative for fever, chills, weight loss, malaise/fatigue and diaphoresis.  HENT: Negative for hearing loss, ear pain, nosebleeds, congestion, sore throat, neck pain, tinnitus and ear discharge.   Eyes: Negative for blurred vision, double vision, photophobia, pain, discharge and redness.  Respiratory: Negative for cough, hemoptysis,  sputum production, shortness of breath, wheezing and stridor.   Cardiovascular: Negative for chest pain, palpitations, orthopnea, claudication, leg swelling and PND.  Gastrointestinal: Positive for abdominal pain. Negative for heartburn, nausea, vomiting, diarrhea, constipation, blood in stool and melena.  Genitourinary: Negative for dysuria, urgency, frequency, hematuria and flank pain.  Musculoskeletal: Negative for myalgias, back pain, joint pain and falls.  Skin: Negative for itching and rash.  Neurological: Negative for dizziness, tingling, tremors, sensory change, speech change, focal weakness, seizures, loss of consciousness, weakness and headaches.  Endo/Heme/Allergies: Negative for environmental allergies and polydipsia. Does not bruise/bleed easily.  Psychiatric/Behavioral: Negative for depression, suicidal ideas, hallucinations, memory loss and substance abuse. The patient is not nervous/anxious and does not have insomnia.      PHYSICAL EXAM:  Blood pressure 110/70, pulse 62, height 5\' 5"  (1.651 m), weight 239 lb (108.41 kg), last menstrual period 02/10/2015.    Vitals reviewed. Constitutional: She is oriented to person, place, and time. She appears well-developed and well-nourished.  HENT:  Head: Normocephalic and atraumatic.  Right Ear: External ear normal.  Left Ear: External ear normal.  Nose: Nose normal.  Mouth/Throat: Oropharynx is clear and moist.  Eyes: Conjunctivae and EOM are normal. Pupils are equal, round, and reactive to light. Right eye exhibits no discharge. Left eye exhibits no discharge. No scleral icterus.  Neck: Normal range of motion. Neck supple. No tracheal deviation present. No thyromegaly present.  Cardiovascular: Normal rate, regular rhythm, normal heart sounds and intact distal pulses.  Exam reveals no gallop and no friction rub.   No murmur heard. Respiratory: Effort normal and  breath sounds normal. No respiratory distress. She has no wheezes. She  has no rales. She exhibits no tenderness.  GI: Soft. Bowel sounds are normal. She exhibits no distension and no mass. There is tenderness. There is no rebound and no guarding.  Genitourinary:       Vulva is normal without lesions Vagina is pink moist without discharge Cervix normal in appearance and pap is normal Uterus is per sonogram Adnexa per sonogram Musculoskeletal: Normal range of motion. She exhibits no edema and no tenderness.  Neurological: She is alert and oriented to person, place, and time. She has normal reflexes. She displays normal reflexes. No cranial nerve deficit. She exhibits normal muscle tone. Coordination normal.  Skin: Skin is warm and dry. No rash noted. No erythema. No pallor.  Psychiatric: She has a normal mood and affect. Her behavior is normal. Judgment and thought content normal.    Labs: No results found for this or any previous visit (from the past 336 hour(s)).  EKG: Orders placed or performed during the hospital encounter of 08/06/14  . EKG 12-Lead  . EKG 12-Lead  . EKG 12 lead  . EKG 12 lead    Imaging Studies: US Transvaginal Non-ob  14-Mar-2015  GYNECOLOGIC SONOGRAM Cassandra Mcconnell is a 49 y.o.LMP 02/10/2015 for a pelvic sonogram for f/u rt adnexal fluid collection and cyst seen on CT. Uterus                      7.1 x 5.3 x 5.1 cm, :retroverted uterus with a 1.5 x 1.9 x 1.8 cm fundal fibroid Endometrium          7.7 mm, symmetrical, wnl Right ovary             3.8 x 2.3 x 2.9 cm, ,normal rt ov with a 5.9 x 4.8 x 4.4 cm simple cyst adjacent to rt ov,rt ov does NOT appear to be mobile Left ovary                2.8 x 2 x 2.4 cm, wnl Mult small nabothian simple cysts Technician Comments: PELVIS TA/TV :retroverted uterus with a 1.5 x 1.9 x 1.8 cm fundal fibroid ,EEC 7.67mm,normal lt ov (mobile),normal rt ov with a 5.9 x 4.8 x 4.4 cm simple cyst adjacent to rt ov,rt ov does NOT appear to be mobile,no free fluid,rt adnexal pain during ultrasound [employer]  03/14/2015 9:18 AM Clinical Impression and recommendations: I have reviewed the sonogram results above, combined with the patient's current clinical course, below are my impressions and any appropriate recommendations for management based on the sonographic findings. Right ovarian/paratubal/hydrosalpinx is stable appears simple benign. No other associated concerning findings Uterus is normal and normal endometrium in a menstruating woman Left ovary is normal EURE,LUTHER H 14-Mar-2015 10:54 AM   US Pelvis Complete  2015/03/14  GYNECOLOGIC SONOGRAM Cassandra Mcconnell is a 48 y.o.LMP 02/10/2015 for a pelvic sonogram for f/u rt adnexal fluid collection and cyst seen on CT. Uterus                      7.1 x 5.3 x 5.1 cm, :retroverted uterus with a 1.5 x 1.9 x 1.8 cm fundal fibroid Endometrium          7.7 mm, symmetrical, wnl Right ovary             3.8 x 2.3 x 2.9 cm, ,normal rt ov with a 5.9 x 4.8 x  4.4 cm simple cyst adjacent to rt ov,rt ov does NOT appear to be mobile Left ovary                2.8 x 2 x 2.4 cm, wnl Mult small nabothian simple cysts Technician Comments: PELVIS TA/TV :retroverted uterus with a 1.5 x 1.9 x 1.8 cm fundal fibroid ,EEC 7.71mm,normal lt ov (mobile),normal rt ov with a 5.9 x 4.8 x 4.4 cm simple cyst adjacent to rt ov,rt ov does NOT appear to be mobile,no free fluid,rt adnexal pain during ultrasound [employer] 02/26/2015 9:18 AM Clinical Impression and recommendations: I have reviewed the sonogram results above, combined with the patient's current clinical course, below are my impressions and any appropriate recommendations for management based on the sonographic findings. Right ovarian/paratubal/hydrosalpinx is stable appears simple benign. No other associated concerning findings Uterus is normal and normal endometrium in a menstruating woman Left ovary is normal EURE,LUTHER H 02/26/2015 10:54 AM      Assessment: Right ovarian paraovarian mass RLQ pain Dyspareunia Patient Active Problem  List   Diagnosis Date Noted  . Cutaneous skin tags 02/18/2015  . Complex ovarian cyst 10/15/2014  . Diverticulitis 10/12/2014  . Severe obesity (BMI >= 40) ([place]) 05/26/2014  . Depression 05/31/2013  . Morbid obesity ([place]) 12/04/2009  . PAIN IN THORACIC SPINE 11/11/2008  . 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 HYPERTROPHY, BREAST 11/20/2006  . Anxiety state 11/10/2006  . GERD 11/10/2006  . HYPERCHOLESTEROLEMIA, PURE 11/09/2006  . HYPERTENSION, BENIGN ESSENTIAL 11/09/2006  . CHOLECYSTECTOMY, HX OF 11/09/2006    Plan: Laparosocpic BSO  EURE,LUTHER H 02/26/2015 11:06 AM            Face to face time:  15 minutes  Greater than 50% of the visit time was spent in counseling and coordination of care with the patient.  The summary and outline of the counseling and care coordination is summarized in the note above.   All questions were answered.  Past Medical History  Diagnosis Date  . Anxiety   . GERD (gastroesophageal reflux disease)   . Hyperlipidemia   . Hypertension   . Wears glasses   . Depression   . Back pain   . Diverticulitis     Past Surgical History  Procedure Laterality Date  . Septoplasty  1988  . Tonsillectomy  1991  . Cholecystectomy      1998  . Back surgery  2004    l4/5 fusion  . Breast reduction surgery Bilateral 08/08/2014    Procedure: BILATERAL BREAST REDUCTION  (BREAST);  Surgeon: Irene Limbo, MD;  Location: [facility];  Service: Plastics;  Laterality: Bilateral;    OB History    No data available      Allergies  Allergen Reactions  . Penicillins Shortness Of Breath    Pt tolerated ceftriaxone given in ED 10/12/14  . Zofran [Ondansetron Hcl] Shortness Of Breath    Numbness,  . Azithromycin Swelling  . Cimetidine Other (See Comments)    unknown  . Conjugated Estrogens Other (See Comments)    emotional  . Cyclobenzaprine Hcl     REACTION: disorientation  . Dicyclomine Hcl Other (See Comments)     unknown  . Flagyl [Metronidazole] Nausea Only  . Lisinopril     REACTION: RASH AND MOUTH SORE  . Norco [Hydrocodone-Acetaminophen] Other (See Comments)    Felt like she was going through withdrawal  . Paroxetine Other (See Comments)    Unknown  . Percocet [Oxycodone-Acetaminophen]  Hypotension   . Rocephin [Ceftriaxone] Nausea Only  . Rosuvastatin Dermatitis  . Sulfa Antibiotics Hives and Itching  . Wellbutrin [Bupropion Hcl] Other (See Comments)    Emotional, makes depression worse  . Doxycycline Hyclate Itching and Rash  . Erythromycin Base Itching and Rash  . Prednisone Rash    Social History   Social History  . Marital Status: Married    Spouse Name: N/A  . Number of Children: 1  . Years of Education: N/A   Occupational History  . stay at home mom    Social History Main Topics  . Smoking status: Never Smoker   . Smokeless tobacco: Never Used  . Alcohol Use: No  . Drug Use: No  . Sexual Activity: Not Asked   Other Topics Concern  . None   Social History Narrative    Family History  Problem Relation Age of Onset  . COPD Mother   . Hypertension Mother   . GER disease Mother   . Heart attack Father   . Cancer Father     prostate  . Hyperlipidemia Brother   . Hypertension Brother   . Diabetes Brother   . Heart attack Paternal Grandmother   . Heart attack Paternal Grandfather   . Stroke Paternal Grandfather   . Hyperlipidemia Brother   . Hypertension Brother

## 2015-02-26 NOTE — Progress Notes (Signed)
PELVIS TA/TV :retroverted uterus with a 1.5 x 1.9 x 1.8 cm fundal fibroid ,EEC 7.20mm,normal lt ov (mobile),normal rt ov with a 5.9 x 4.8 x 4.4 cm simple cyst adjacent to rt ov,rt ov does NOT appear to be mobile,no free fluid,rt adnexal pain during ultrasound

## 2015-03-06 NOTE — Patient Instructions (Signed)
Cassandra Mcconnell  03/06/2015     @PREFPERIOPPHARMACY @   Your procedure is scheduled on 03/11/15   Report to [facility] at  745  A.M.  Call this number if you have problems the morning of surgery:  [phone]   Remember:  Do not eat food or drink liquids after midnight.  Take these medicines the morning of surgery with A SIP OF WATER Celexa, metoprolol, prilosec.   Do not wear jewelry, make-up or nail polish.  Do not wear lotions, powders, or perfumes.  You may wear deodorant.  Do not shave 48 hours prior to surgery.  Men may shave face and neck.  Do not bring valuables to the hospital.  [facility] is not responsible for any belongings or valuables.  Contacts, dentures or bridgework may not be worn into surgery.  Leave your suitcase in the car.  After surgery it may be brought to your room.  For patients admitted to the hospital, discharge time will be determined by your treatment team.  Patients discharged the day of surgery will not be allowed to drive home.   Name and phone number of your driver:   family Special instructions:  none  Please read over the following fact sheets that you were given. Pain Booklet, Coughing and Deep Breathing, Surgical Site Infection Prevention, Anesthesia Post-op Instructions and Care and Recovery After Surgery      Sterilization Information, Female Female sterilization is a procedure to permanently prevent pregnancy. There are different ways to perform sterilization, but all either block or close the fallopian tubes so that your eggs cannot reach your uterus. If your egg cannot reach your uterus, sperm cannot fertilize the egg, and you cannot get pregnant.  Sterilization is performed by a surgical procedure. Sometimes these procedures are performed in a hospital while a patient is asleep. Sometimes they can be done in a clinic setting with the patient awake. The fallopian tubes can be surgically cut, tied, or sealed through a  procedure called tubal ligation. The fallopian tubes can also be closed with clips or rings. Sterilization can also be done by placing a tiny coil into each fallopian tube, which causes scar tissue to grow inside the tube. The scar tissue then blocks the tubes.  Discuss sterilization with your caregiver to answer any concerns you or your partner may have. You may want to ask what type of sterilization your caregiver performs. Some caregivers may not perform all the various options. Sterilization is permanent and should only be done if you are sure you do not want children or do not want any more children. Having a sterilization reversed may not be successful.  STERILIZATION PROCEDURES  Laparoscopic sterilization. This is a surgical method performed at a time other than right after childbirth. Two incisions are made in the lower abdomen. A thin, lighted tube (laparoscope) is inserted into one of the incisions and is used to perform the procedure. The fallopian tubes are closed with a ring or a clip. An instrument that uses heat could be used to seal the tubes closed (electrocautery).   Mini-laparotomy. This is a surgical method done 1 or 2 days after giving birth. Typically, a small incision is made just below the belly button (umbilicus) and the fallopian tubes are exposed. The tubes can then be sealed, tied, or cut.   Hysteroscopic sterilization. This is performed at a time other than right after childbirth. A tiny, spring-like coil is inserted through the  cervix and uterus and placed into the fallopian tubes. The coil causes scaring and blocks the tubes. Other forms of contraception should be used for 3 months after the procedure to allow the scar tissue to form completely. Additionally, it is required hysterosalpingography be done 3 months later to ensure that the procedure was successful. Hysterosalpingography is a procedure that uses X-rays to look at your uterus and fallopian tubes after a material  to make them show up better has been inserted. IS STERILIZATION SAFE? Sterilization is considered safe with very rare complications. Risks depend on the type of procedure you have. As with any surgical procedure, there are risks. Some risks of sterilization by any means include:   Bleeding.  Infection.  Reaction to anesthesia medicine.  Injury to surrounding organs. Risks specific to having hysteroscopic coils placed include:  The coils may not be placed correctly the first time.   The coils may move out of place.   The tubes may not get completely blocked after 3 months.   Injury to surrounding organs when placing the coil.  HOW EFFECTIVE IS FEMALE STERILIZATION? Sterilization is nearly 100% effective, but it can fail. Depending on the type of sterilization, the rate of failure can be as high as 3%. After hysteroscopic sterilization with placement of fallopian tube coils, you will need back-up birth control for 3 months after the procedure. Sterilization is effective for a lifetime.  BENEFITS OF STERILIZATION  It does not affect your hormones, and therefore will not affect your menstrual periods, sexual desire, or performance.   It is effective for a lifetime.   It is safe.   You do not need to worry about getting pregnant. Keep in mind that if you had the hysteroscopic placement procedure, you must wait 3 months after the procedure (or until your caregiver confirms) before pregnancy is not considered possible.   There are no side effects unlike other types of birth control (contraception).  DRAWBACKS OF STERILIZATION  You must be sure you do not want children or any more children. The procedure is permanent.   It does not provide protection against sexually transmitted infections (STIs).   The tubes can grow back together. If this happens, there is a risk of pregnancy. There is also an increased risk (50%) of pregnancy being an ectopic pregnancy. This is a  pregnancy that happens outside of the uterus.   This information is not intended to replace advice given to you by your health care provider. Make sure you discuss any questions you have with your health care provider.   Document Released: 09/28/2007 Document Revised: 04/16/2013 Document Reviewed: 07/28/2011 Elsevier Interactive Patient Education 2016 [place], also called tubectomy, is the surgical removal of one of the fallopian tubes. The fallopian tubes are tubes that are connected to the uterus. These tubes transport the egg from the ovary to the uterus. A salpingectomy may be done for various reasons, including:   A tubal (ectopic) pregnancy. This is especially true if the tube ruptures.  An infected fallopian tube.  The need to remove the fallopian tube when removing an ovary with a cyst or tumor.  The need to remove the fallopian tube when removing the uterus.  Cancer of the fallopian tube or nearby organs. Removing one fallopian tube does not prevent you from becoming pregnant. It also does not cause problems with your menstrual periods.  LET [facility] CARE PROVIDER KNOW ABOUT:  Any allergies you have.  All medicines you are taking,  including vitamins, herbs, eye drops, creams, and over-the-counter medicines.  Previous problems you or members of your family have had with the use of anesthetics.  Any blood disorders you have.  Previous surgeries you have had.  Medical conditions you have. RISKS AND COMPLICATIONS  Generally, this is a safe procedure. However, as with any procedure, complications can occur. Possible complications include:  Injury to surrounding organs.  Bleeding.  Infection.  Problems related to anesthesia. BEFORE THE PROCEDURE  Ask your health care provider about changing or stopping your regular medicines. You may need to stop taking certain medicines, such as aspirin or blood thinners, at least 1 week before the  surgery.  Do not eat or drink anything for at least 8 hours before the surgery.  If you smoke, do not smoke for at least 2 weeks before the surgery.  Make plans to have someone drive you home after the procedure or after your hospital stay. Also arrange for someone to help you with activities during recovery. PROCEDURE   You will be given medicine to help you relax before the procedure (sedative). You will then be given medicine to make you sleep through the procedure (general anesthetic). These medicines will be given through an IV access tube that is put into one of your veins.  Once you are asleep, your lower abdomen will be shaved and cleaned. A thin, flexible tube (catheter) will be placed in your bladder.  The surgeon may use a laparoscopic, robotic, or open technique for this surgery:  In the laparoscopic technique, the surgery is done through two small cuts (incisions) in the abdomen. A thin, lighted tube with a tiny camera on the end (laparoscope) is inserted into one of the incisions. The tools needed for the procedure are put through the other incision.  A robotic technique may be chosen to perform complex surgery in a small space. In the robotic technique, small incisions will be made. A camera and surgical instruments are passed through the incisions. Surgical instruments will be controlled with the help of a robotic arm.  In the open technique, the surgery is done through one large incision in the abdomen.  Using any of these techniques, the surgeon removes the fallopian tube from where it attaches to the uterus. The blood vessels will be clamped and tied.  The surgeon then uses staples or stitches to close the incision or incisions. AFTER THE PROCEDURE   You will be taken to a recovery area where your progress will be monitored for 1-3 hours.  If the laparoscopic technique was used, you may be allowed to go home after several hours. You may have some shoulder pain after the  laparoscopic procedure. This is normal and usually goes away in a day or two.  If the open technique was used, you will be admitted to the hospital for a couple of days.  You will be given pain medicine if needed.  The IV access tube and catheter will be removed before you are discharged.   This information is not intended to replace advice given to you by your health care provider. Make sure you discuss any questions you have with your health care provider.   Document Released: 08/28/2008 Document Revised: 05/02/2014 Document Reviewed: 10/03/2012 Elsevier Interactive Patient Education 2016 [employer] PATIENT INSTRUCTIONS POST-ANESTHESIA  IMMEDIATELY FOLLOWING SURGERY:  Do not drive or operate machinery for the first twenty four hours after surgery.  Do not make any important decisions for twenty four hours after surgery or  while taking narcotic pain medications or sedatives.  If you develop intractable nausea and vomiting or a severe headache please notify your doctor immediately.  FOLLOW-UP:  Please make an appointment with your surgeon as instructed. You do not need to follow up with anesthesia unless specifically instructed to do so.  WOUND CARE INSTRUCTIONS (if applicable):  Keep a dry clean dressing on the anesthesia/puncture wound site if there is drainage.  Once the wound has quit draining you may leave it open to air.  Generally you should leave the bandage intact for twenty four hours unless there is drainage.  If the epidural site drains for more than 36-48 hours please call the anesthesia department.  QUESTIONS?:  Please feel free to call your physician or the hospital operator if you have any questions, and they will be happy to assist you.

## 2015-03-09 ENCOUNTER — Encounter (HOSPITAL_COMMUNITY): Payer: Self-pay

## 2015-03-09 ENCOUNTER — Other Ambulatory Visit: Payer: Self-pay | Admitting: Obstetrics & Gynecology

## 2015-03-09 ENCOUNTER — Encounter (HOSPITAL_COMMUNITY)
Admission: RE | Admit: 2015-03-09 | Discharge: 2015-03-09 | Disposition: A | Discharge status: Home / Self Care | Payer: Commercial Managed Care - HMO | Source: Ambulatory Visit | Attending: Obstetrics & Gynecology | Admitting: Obstetrics & Gynecology

## 2015-03-09 DIAGNOSIS — Z881 Allergy status to other antibiotic agents status: Secondary | ICD-10-CM | POA: Diagnosis not present

## 2015-03-09 DIAGNOSIS — F329 Major depressive disorder, single episode, unspecified: Secondary | ICD-10-CM | POA: Diagnosis not present

## 2015-03-09 DIAGNOSIS — Z882 Allergy status to sulfonamides status: Secondary | ICD-10-CM | POA: Diagnosis not present

## 2015-03-09 DIAGNOSIS — K219 Gastro-esophageal reflux disease without esophagitis: Secondary | ICD-10-CM | POA: Diagnosis not present

## 2015-03-09 DIAGNOSIS — F419 Anxiety disorder, unspecified: Secondary | ICD-10-CM | POA: Diagnosis not present

## 2015-03-09 DIAGNOSIS — Z79899 Other long term (current) drug therapy: Secondary | ICD-10-CM | POA: Diagnosis not present

## 2015-03-09 DIAGNOSIS — N838 Other noninflammatory disorders of ovary, fallopian tube and broad ligament: Secondary | ICD-10-CM | POA: Diagnosis not present

## 2015-03-09 DIAGNOSIS — Z888 Allergy status to other drugs, medicaments and biological substances status: Secondary | ICD-10-CM | POA: Diagnosis not present

## 2015-03-09 DIAGNOSIS — Z9049 Acquired absence of other specified parts of digestive tract: Secondary | ICD-10-CM | POA: Diagnosis not present

## 2015-03-09 DIAGNOSIS — N83201 Unspecified ovarian cyst, right side: Secondary | ICD-10-CM | POA: Diagnosis not present

## 2015-03-09 DIAGNOSIS — I10 Essential (primary) hypertension: Secondary | ICD-10-CM | POA: Diagnosis not present

## 2015-03-09 DIAGNOSIS — Z880 Allergy status to penicillin: Secondary | ICD-10-CM | POA: Diagnosis not present

## 2015-03-09 DIAGNOSIS — Z6839 Body mass index (BMI) 39.0-39.9, adult: Secondary | ICD-10-CM | POA: Diagnosis not present

## 2015-03-09 DIAGNOSIS — E785 Hyperlipidemia, unspecified: Secondary | ICD-10-CM | POA: Diagnosis not present

## 2015-03-09 DIAGNOSIS — E7800 Pure hypercholesterolemia, unspecified: Secondary | ICD-10-CM | POA: Diagnosis not present

## 2015-03-09 DIAGNOSIS — Z885 Allergy status to narcotic agent status: Secondary | ICD-10-CM | POA: Diagnosis not present

## 2015-03-09 DIAGNOSIS — Z981 Arthrodesis status: Secondary | ICD-10-CM | POA: Diagnosis not present

## 2015-03-09 DIAGNOSIS — R2231 Localized swelling, mass and lump, right upper limb: Secondary | ICD-10-CM | POA: Diagnosis present

## 2015-03-09 LAB — COMPREHENSIVE METABOLIC PANEL
ALT: 19 U/L (ref 14–54)
ANION GAP: 12 (ref 5–15)
AST: 22 U/L (ref 15–41)
Albumin: 3.9 g/dL (ref 3.5–5.0)
Alkaline Phosphatase: 69 U/L (ref 38–126)
BUN: 18 mg/dL (ref 6–20)
CALCIUM: 8.9 mg/dL (ref 8.9–10.3)
CO2: 19 mmol/L — ABNORMAL LOW (ref 22–32)
CREATININE: 0.7 mg/dL (ref 0.44–1.00)
Chloride: 105 mmol/L (ref 101–111)
Glucose, Bld: 80 mg/dL (ref 65–99)
Potassium: 4.3 mmol/L (ref 3.5–5.1)
SODIUM: 136 mmol/L (ref 135–145)
TOTAL PROTEIN: 6.8 g/dL (ref 6.5–8.1)
Total Bilirubin: 0.6 mg/dL (ref 0.3–1.2)

## 2015-03-09 LAB — CBC
HCT: 39.7 % (ref 36.0–46.0)
Hemoglobin: 12.9 g/dL (ref 12.0–15.0)
MCH: 27.9 pg (ref 26.0–34.0)
MCHC: 32.5 g/dL (ref 30.0–36.0)
MCV: 85.9 fL (ref 78.0–100.0)
PLATELETS: 294 10*3/uL (ref 150–400)
RBC: 4.62 MIL/uL (ref 3.87–5.11)
RDW: 15.1 % (ref 11.5–15.5)
WBC: 11.4 10*3/uL — ABNORMAL HIGH (ref 4.0–10.5)

## 2015-03-09 LAB — TYPE AND SCREEN
ABO/RH(D): A POS
Antibody Screen: NEGATIVE

## 2015-03-09 LAB — HCG, QUANTITATIVE, PREGNANCY

## 2015-03-09 NOTE — Pre-Procedure Instructions (Signed)
Patient takes all medications, including metoprolol in the pm

## 2015-03-11 ENCOUNTER — Ambulatory Visit (HOSPITAL_COMMUNITY): Payer: Commercial Managed Care - HMO | Admitting: Anesthesiology

## 2015-03-11 ENCOUNTER — Encounter (HOSPITAL_COMMUNITY): Payer: Self-pay | Admitting: *Deleted

## 2015-03-11 ENCOUNTER — Ambulatory Visit (HOSPITAL_COMMUNITY)
Admission: RE | Admit: 2015-03-11 | Discharge: 2015-03-11 | Disposition: A | Discharge status: Home / Self Care | Payer: Commercial Managed Care - HMO | Source: Ambulatory Visit | Attending: Obstetrics & Gynecology | Admitting: Obstetrics & Gynecology

## 2015-03-11 ENCOUNTER — Encounter (HOSPITAL_COMMUNITY)
Admission: RE | Disposition: A | Discharge status: Home / Self Care | Payer: Self-pay | Source: Ambulatory Visit | Attending: Obstetrics & Gynecology

## 2015-03-11 DIAGNOSIS — N736 Female pelvic peritoneal adhesions (postinfective): Secondary | ICD-10-CM | POA: Diagnosis not present

## 2015-03-11 DIAGNOSIS — F329 Major depressive disorder, single episode, unspecified: Secondary | ICD-10-CM | POA: Diagnosis not present

## 2015-03-11 DIAGNOSIS — N83201 Unspecified ovarian cyst, right side: Secondary | ICD-10-CM | POA: Diagnosis not present

## 2015-03-11 DIAGNOSIS — K219 Gastro-esophageal reflux disease without esophagitis: Secondary | ICD-10-CM | POA: Diagnosis not present

## 2015-03-11 DIAGNOSIS — Z79899 Other long term (current) drug therapy: Secondary | ICD-10-CM | POA: Insufficient documentation

## 2015-03-11 DIAGNOSIS — Z881 Allergy status to other antibiotic agents status: Secondary | ICD-10-CM | POA: Insufficient documentation

## 2015-03-11 DIAGNOSIS — N838 Other noninflammatory disorders of ovary, fallopian tube and broad ligament: Secondary | ICD-10-CM | POA: Diagnosis not present

## 2015-03-11 DIAGNOSIS — Z6839 Body mass index (BMI) 39.0-39.9, adult: Secondary | ICD-10-CM | POA: Insufficient documentation

## 2015-03-11 DIAGNOSIS — Z981 Arthrodesis status: Secondary | ICD-10-CM | POA: Insufficient documentation

## 2015-03-11 DIAGNOSIS — N8353 Torsion of ovary, ovarian pedicle and fallopian tube: Secondary | ICD-10-CM | POA: Diagnosis not present

## 2015-03-11 DIAGNOSIS — E7800 Pure hypercholesterolemia, unspecified: Secondary | ICD-10-CM | POA: Diagnosis not present

## 2015-03-11 DIAGNOSIS — F419 Anxiety disorder, unspecified: Secondary | ICD-10-CM | POA: Diagnosis not present

## 2015-03-11 DIAGNOSIS — Z880 Allergy status to penicillin: Secondary | ICD-10-CM | POA: Insufficient documentation

## 2015-03-11 DIAGNOSIS — Z888 Allergy status to other drugs, medicaments and biological substances status: Secondary | ICD-10-CM | POA: Insufficient documentation

## 2015-03-11 DIAGNOSIS — N854 Malposition of uterus: Secondary | ICD-10-CM | POA: Diagnosis not present

## 2015-03-11 DIAGNOSIS — I10 Essential (primary) hypertension: Secondary | ICD-10-CM | POA: Insufficient documentation

## 2015-03-11 DIAGNOSIS — R102 Pelvic and perineal pain: Secondary | ICD-10-CM | POA: Diagnosis not present

## 2015-03-11 DIAGNOSIS — Z9049 Acquired absence of other specified parts of digestive tract: Secondary | ICD-10-CM | POA: Insufficient documentation

## 2015-03-11 DIAGNOSIS — Z882 Allergy status to sulfonamides status: Secondary | ICD-10-CM | POA: Insufficient documentation

## 2015-03-11 DIAGNOSIS — N941 Unspecified dyspareunia: Secondary | ICD-10-CM | POA: Diagnosis not present

## 2015-03-11 DIAGNOSIS — Z885 Allergy status to narcotic agent status: Secondary | ICD-10-CM | POA: Insufficient documentation

## 2015-03-11 DIAGNOSIS — E785 Hyperlipidemia, unspecified: Secondary | ICD-10-CM | POA: Diagnosis not present

## 2015-03-11 HISTORY — PX: LAPAROSCOPIC BILATERAL SALPINGO OOPHERECTOMY: SHX5890

## 2015-03-11 SURGERY — SALPINGO-OOPHORECTOMY, BILATERAL, LAPAROSCOPIC
Anesthesia: General | Laterality: Bilateral

## 2015-03-11 MED ORDER — LACTATED RINGERS IV SOLN
INTRAVENOUS | Status: DC
  Administered 2015-03-11: 12:00:00 via INTRAVENOUS
  Administered 2015-03-11: 1000 mL via INTRAVENOUS

## 2015-03-11 MED ORDER — SODIUM CHLORIDE 0.9 % IR SOLN
Status: DC | PRN
  Administered 2015-03-11: 3000 mL
  Administered 2015-03-11: 1000 mL

## 2015-03-11 MED ORDER — GLYCOPYRROLATE 0.2 MG/ML IJ SOLN
0.2000 mg | Freq: Once | INTRAMUSCULAR | Status: AC
  Administered 2015-03-11: 0.2 mg via INTRAVENOUS

## 2015-03-11 MED ORDER — ROCURONIUM BROMIDE 100 MG/10ML IV SOLN
INTRAVENOUS | Status: DC | PRN
  Administered 2015-03-11: 10 mg via INTRAVENOUS
  Administered 2015-03-11: 35 mg via INTRAVENOUS
  Administered 2015-03-11: 5 mg via INTRAVENOUS

## 2015-03-11 MED ORDER — BUPIVACAINE LIPOSOME 1.3 % IJ SUSP
INTRAMUSCULAR | Status: AC
  Filled 2015-03-11: qty 20

## 2015-03-11 MED ORDER — GLYCOPYRROLATE 0.2 MG/ML IJ SOLN
INTRAMUSCULAR | Status: AC
  Filled 2015-03-11: qty 1

## 2015-03-11 MED ORDER — PROPOFOL 10 MG/ML IV BOLUS
INTRAVENOUS | Status: DC | PRN
  Administered 2015-03-11: 180 mg via INTRAVENOUS
  Administered 2015-03-11: 20 mg via INTRAVENOUS

## 2015-03-11 MED ORDER — SUCCINYLCHOLINE CHLORIDE 20 MG/ML IJ SOLN
INTRAMUSCULAR | Status: AC
  Filled 2015-03-11: qty 1

## 2015-03-11 MED ORDER — PROMETHAZINE HCL 25 MG PO TABS: 25.0000 mg | ORAL_TABLET | Freq: Four times a day (QID) | ORAL | Status: DC | PRN

## 2015-03-11 MED ORDER — CIPROFLOXACIN IN D5W 400 MG/200ML IV SOLN
400.0000 mg | INTRAVENOUS | Status: AC
  Administered 2015-03-11: 400 mg via INTRAVENOUS
  Filled 2015-03-11: qty 200

## 2015-03-11 MED ORDER — CLINDAMYCIN PHOSPHATE 900 MG/50ML IV SOLN
900.0000 mg | INTRAVENOUS | Status: AC
  Administered 2015-03-11: 900 mg via INTRAVENOUS
  Filled 2015-03-11: qty 50

## 2015-03-11 MED ORDER — HYDROMORPHONE HCL 2 MG PO TABS: 2.0000 mg | ORAL_TABLET | ORAL | Status: DC | PRN

## 2015-03-11 MED ORDER — NEOSTIGMINE METHYLSULFATE 10 MG/10ML IV SOLN
INTRAVENOUS | Status: DC | PRN
  Administered 2015-03-11: 3 mg via INTRAVENOUS

## 2015-03-11 MED ORDER — GLYCOPYRROLATE 0.2 MG/ML IJ SOLN
INTRAMUSCULAR | Status: DC | PRN
  Administered 2015-03-11: 0.6 mg via INTRAVENOUS

## 2015-03-11 MED ORDER — BUPIVACAINE LIPOSOME 1.3 % IJ SUSP
INTRAMUSCULAR | Status: DC | PRN
  Administered 2015-03-11: 20 mL

## 2015-03-11 MED ORDER — PROPOFOL 10 MG/ML IV BOLUS
INTRAVENOUS | Status: AC
  Filled 2015-03-11: qty 20

## 2015-03-11 MED ORDER — SODIUM CHLORIDE 0.9 % IJ SOLN
INTRAMUSCULAR | Status: AC
  Filled 2015-03-11: qty 3

## 2015-03-11 MED ORDER — FENTANYL CITRATE (PF) 100 MCG/2ML IJ SOLN
INTRAMUSCULAR | Status: DC | PRN
  Administered 2015-03-11 (×4): 50 ug via INTRAVENOUS

## 2015-03-11 MED ORDER — LIDOCAINE HCL (CARDIAC) 10 MG/ML IV SOLN
INTRAVENOUS | Status: DC | PRN
  Administered 2015-03-11: 50 mg via INTRAVENOUS

## 2015-03-11 MED ORDER — PROMETHAZINE HCL 25 MG/ML IJ SOLN
INTRAMUSCULAR | Status: AC
  Filled 2015-03-11: qty 1

## 2015-03-11 MED ORDER — FENTANYL CITRATE (PF) 250 MCG/5ML IJ SOLN
INTRAMUSCULAR | Status: AC
  Filled 2015-03-11: qty 5

## 2015-03-11 MED ORDER — KETOROLAC TROMETHAMINE 10 MG PO TABS
10.0000 mg | ORAL_TABLET | Freq: Three times a day (TID) | ORAL | Status: DC | PRN
Start: 2015-03-11 — End: 2015-04-17

## 2015-03-11 MED ORDER — MIDAZOLAM HCL 2 MG/2ML IJ SOLN
1.0000 mg | INTRAMUSCULAR | Status: DC | PRN
  Administered 2015-03-11: 2 mg via INTRAVENOUS

## 2015-03-11 MED ORDER — MIDAZOLAM HCL 2 MG/2ML IJ SOLN
INTRAMUSCULAR | Status: AC
  Filled 2015-03-11: qty 2

## 2015-03-11 MED ORDER — ONDANSETRON HCL 4 MG/2ML IJ SOLN
INTRAMUSCULAR | Status: AC
  Filled 2015-03-11: qty 2

## 2015-03-11 MED ORDER — FENTANYL CITRATE (PF) 100 MCG/2ML IJ SOLN: 25.0000 ug | INTRAMUSCULAR | Status: DC | PRN

## 2015-03-11 MED ORDER — PROMETHAZINE HCL 25 MG/ML IJ SOLN
6.2500 mg | Freq: Once | INTRAMUSCULAR | Status: AC
  Administered 2015-03-11: 6.25 mg via INTRAVENOUS

## 2015-03-11 MED ORDER — KETOROLAC TROMETHAMINE 30 MG/ML IJ SOLN
30.0000 mg | Freq: Once | INTRAMUSCULAR | Status: AC
  Administered 2015-03-11: 30 mg via INTRAVENOUS
  Filled 2015-03-11: qty 1

## 2015-03-11 MED ORDER — GLYCOPYRROLATE 0.2 MG/ML IJ SOLN
INTRAMUSCULAR | Status: AC
  Filled 2015-03-11: qty 3

## 2015-03-11 SURGICAL SUPPLY — 50 items
APPLIER CLIP 5 13 M/L LIGAMAX5 (MISCELLANEOUS) ×3
APR CLP MED LRG 5 ANG JAW (MISCELLANEOUS) ×1
BAG HAMPER (MISCELLANEOUS) ×3 IMPLANT
BAG SPEC RTRVL LRG 6X4 10 (ENDOMECHANICALS) ×2
BLADE SURG SZ11 CARB STEEL (BLADE) ×3 IMPLANT
CLIP APPLIE 5 13 M/L LIGAMAX5 (MISCELLANEOUS) IMPLANT
CLOTH BEACON ORANGE TIMEOUT ST (SAFETY) ×3 IMPLANT
COVER LIGHT HANDLE STERIS (MISCELLANEOUS) ×6 IMPLANT
DRSG GAUZE PETRO 3X36 STRIP (GAUZE/BANDAGES/DRESSINGS) ×2 IMPLANT
ELECT REM PT RETURN 9FT ADLT (ELECTROSURGICAL) ×3
ELECTRODE REM PT RTRN 9FT ADLT (ELECTROSURGICAL) ×1 IMPLANT
FILTER SMOKE EVAC LAPAROSHD (FILTER) ×3 IMPLANT
FORMALIN 10 PREFIL 120ML (MISCELLANEOUS) ×4 IMPLANT
GLOVE BIOGEL PI IND STRL 7.0 (GLOVE) IMPLANT
GLOVE BIOGEL PI IND STRL 8 (GLOVE) ×1 IMPLANT
GLOVE BIOGEL PI INDICATOR 7.0 (GLOVE) ×8
GLOVE BIOGEL PI INDICATOR 8 (GLOVE) ×2
GLOVE ECLIPSE 6.5 STRL STRAW (GLOVE) ×2 IMPLANT
GLOVE ECLIPSE 8.0 STRL XLNG CF (GLOVE) ×3 IMPLANT
GOWN STRL REUS W/TWL LRG LVL3 (GOWN DISPOSABLE) ×5 IMPLANT
GOWN STRL REUS W/TWL XL LVL3 (GOWN DISPOSABLE) ×3 IMPLANT
INST SET LAPROSCOPIC GYN AP (KITS) ×3 IMPLANT
IV NS IRRIG 3000ML ARTHROMATIC (IV SOLUTION) ×3 IMPLANT
KIT ROOM TURNOVER APOR (KITS) ×3 IMPLANT
MANIFOLD NEPTUNE II (INSTRUMENTS) ×3 IMPLANT
NDL HYPO 18GX1.5 BLUNT FILL (NEEDLE) ×1 IMPLANT
NDL HYPO 25X1 1.5 SAFETY (NEEDLE) IMPLANT
NEEDLE HYPO 18GX1.5 BLUNT FILL (NEEDLE) ×3 IMPLANT
NEEDLE HYPO 25X1 1.5 SAFETY (NEEDLE) ×3 IMPLANT
NEEDLE INSUFFLATION 120MM (ENDOMECHANICALS) ×3 IMPLANT
PACK PERI GYN (CUSTOM PROCEDURE TRAY) ×3 IMPLANT
PAD ARMBOARD 7.5X6 YLW CONV (MISCELLANEOUS) ×3 IMPLANT
POUCH SPECIMEN RETRIEVAL 10MM (ENDOMECHANICALS) ×4 IMPLANT
SCALPEL HARMONIC ACE (MISCELLANEOUS) ×3 IMPLANT
SET BASIN LINEN APH (SET/KITS/TRAYS/PACK) ×3 IMPLANT
SET TUBE IRRIG SUCTION NO TIP (IRRIGATION / IRRIGATOR) ×3 IMPLANT
SOLUTION ANTI FOG 6CC (MISCELLANEOUS) ×3 IMPLANT
SPONGE GAUZE 2X2 8PLY STER LF (GAUZE/BANDAGES/DRESSINGS) ×3
SPONGE GAUZE 2X2 8PLY STRL LF (GAUZE/BANDAGES/DRESSINGS) ×6 IMPLANT
STAPLER VISISTAT 35W (STAPLE) ×3 IMPLANT
SUT VICRYL 0 UR6 27IN ABS (SUTURE) ×5 IMPLANT
SYR 20CC LL (SYRINGE) ×3 IMPLANT
SYR CONTROL 10ML LL (SYRINGE) ×3 IMPLANT
TAPE CLOTH SURG 4X10 WHT LF (GAUZE/BANDAGES/DRESSINGS) ×2 IMPLANT
TRAY FOLEY CATH SILVER 16FR (SET/KITS/TRAYS/PACK) ×3 IMPLANT
TROCAR ENDO BLADELESS 11MM (ENDOMECHANICALS) ×2 IMPLANT
TROCAR XCEL NON-BLD 5MMX100MML (ENDOMECHANICALS) ×3 IMPLANT
TROCAR XCEL OPT SLVE 5M 100M (ENDOMECHANICALS) ×3 IMPLANT
TUBING INSUF HEATED (TUBING) ×3 IMPLANT
WARMER LAPAROSCOPE (MISCELLANEOUS) ×3 IMPLANT

## 2015-03-11 NOTE — H&P (Signed)
Follow up appointment for results  Chief Complaint  Patient presents with  . Follow-up    ultrasound today.    Blood pressure 110/70, pulse 62, height 5\' 5"  (1.651 m), weight 239 lb (108.41 kg), last menstrual period 02/10/2015.  US Transvaginal Non-ob  02/26/2015 GYNECOLOGIC SONOGRAM Cassandra Mcconnell is a 49 y.o.LMP 02/10/2015 for a pelvic sonogram for f/u rt adnexal fluid collection and cyst seen on CT. Uterus  7.1 x 5.3 x 5.1 cm, :retroverted uterus with a 1.5 x 1.9 x 1.8 cm fundal fibroid Endometrium 7.7 mm, symmetrical, wnl Right ovary 3.8 x 2.3 x 2.9 cm, ,normal rt ov with a 5.9 x 4.8 x 4.4 cm simple cyst adjacent to rt ov,rt ov does NOT appear to be mobile Left ovary 2.8 x 2 x 2.4 cm, wnl Mult small nabothian simple cysts Technician Comments: PELVIS TA/TV :retroverted uterus with a 1.5 x 1.9 x 1.8 cm fundal fibroid ,EEC 7.51mm,normal lt ov (mobile),normal rt ov with a 5.9 x 4.8 x 4.4 cm simple cyst adjacent to rt ov,rt ov does NOT appear to be mobile,no free fluid,rt adnexal pain during ultrasound [employer] 02/26/2015 9:18 AM Clinical Impression and recommendations: I have reviewed the sonogram results above, combined with the patient's current clinical course, below are my impressions and any appropriate recommendations for management based on the sonographic findings. Right ovarian/paratubal/hydrosalpinx is stable appears simple benign. No other associated concerning findings Uterus is normal and normal endometrium in a menstruating woman Left ovary is normal Cassandra Mcconnell 02/26/2015 10:54 AM   US Pelvis Complete  02/26/2015 GYNECOLOGIC SONOGRAM Cassandra Mcconnell is a 49 y.o.LMP 02/10/2015 for a pelvic sonogram for f/u rt adnexal fluid collection and cyst seen on CT. Uterus  7.1 x 5.3 x 5.1 cm, :retroverted uterus with a 1.5 x 1.9 x 1.8 cm fundal fibroid Endometrium 7.7 mm, symmetrical, wnl Right  ovary 3.8 x 2.3 x 2.9 cm, ,normal rt ov with a 5.9 x 4.8 x 4.4 cm simple cyst adjacent to rt ov,rt ov does NOT appear to be mobile Left ovary 2.8 x 2 x 2.4 cm, wnl Mult small nabothian simple cysts Technician Comments: PELVIS TA/TV :retroverted uterus with a 1.5 x 1.9 x 1.8 cm fundal fibroid ,EEC 7.97mm,normal lt ov (mobile),normal rt ov with a 5.9 x 4.8 x 4.4 cm simple cyst adjacent to rt ov,rt ov does NOT appear to be mobile,no free fluid,rt adnexal pain during ultrasound [employer] 02/26/2015 9:18 AM Clinical Impression and recommendations: I have reviewed the sonogram results above, combined with the patient's current clinical course, below are my impressions and any appropriate recommendations for management based on the sonographic findings. Right ovarian/paratubal/hydrosalpinx is stable appears simple benign. No other associated concerning findings Uterus is normal and normal endometrium in a menstruating woman Left ovary is normal Cassandra Mcconnell 02/26/2015 10:54 AM    Pt has been having increasing pain in the RLQ, most notably with intercourse but also some pain at other times Today during exam very tender with scan on the right side of the mass  MEDS ordered this encounter: Meds ordered this encounter  Medications  . metoprolol tartrate (LOPRESSOR) 25 MG tablet    Sig: Take 25 mg by mouth 2 (two) times daily.  Marland Kitchen omeprazole (PRILOSEC) 20 MG capsule    Sig: Take 20 mg by mouth daily.    Orders for this encounter: No orders of the defined types were placed in this encounter.   Plan: Pt wants to proceed with  removal and wants to have laparoscopic BSO given her age and desire to prevent ovarian cancer in the future She understands this will cause immediate menopause and she may require/desire HRT for some time after surgery  Follow Up:  Decision for surgery today: November 16,2016  Laparoscopic BSO  Preoperative History and Physical  Cassandra Mcconnell is a 49 y.o. No obstetric history on file. with Patient's last menstrual period was 02/10/2015. admitted for a laparoscopic BSO.  See above note for details  PMH:  Past Medical History  Diagnosis Date  . Anxiety   . GERD (gastroesophageal reflux disease)   . Hyperlipidemia   . Hypertension   . Wears glasses   . Depression   . Back pain   . Diverticulitis     PSH:  Past Surgical History  Procedure Laterality Date  . Septoplasty  1988  . Tonsillectomy  1991  . Cholecystectomy      1998  . Back surgery  2004    l4/5 fusion  . Breast reduction surgery Bilateral 08/08/2014    Procedure: BILATERAL BREAST REDUCTION (BREAST); Surgeon: Irene Limbo, MD; Location: [facility] [place]; Service: Plastics; Laterality: Bilateral;    POb/GynH:  OB History    No data available      SH:  Social History  Substance Use Topics  . Smoking status: Never Smoker   . Smokeless tobacco: Never Used  . Alcohol Use: No    FH:  Family History  Problem Relation Age of Onset  . COPD Mother   . Hypertension Mother   . GER disease Mother   . Heart attack Father   . Cancer Father     prostate  . Hyperlipidemia Brother   . Hypertension Brother   . Diabetes Brother   . Heart attack Paternal Grandmother   . Heart attack Paternal Grandfather   . Stroke Paternal Grandfather   . Hyperlipidemia Brother   . Hypertension Brother      Allergies:  Allergies  Allergen Reactions  . Penicillins Shortness Of Breath    Pt tolerated ceftriaxone given in ED 10/12/14  . Zofran [Ondansetron Hcl] Shortness Of Breath    Numbness,  . Azithromycin Swelling  . Cimetidine Other (See Comments)    unknown  . Conjugated Estrogens Other (See Comments)    emotional  . Cyclobenzaprine Hcl     REACTION:  disorientation  . Dicyclomine Hcl Other (See Comments)    unknown  . Flagyl [Metronidazole] Nausea Only  . Lisinopril     REACTION: RASH AND MOUTH SORE  . Norco [Hydrocodone-Acetaminophen] Other (See Comments)    Felt like she was going through withdrawal  . Paroxetine Other (See Comments)    Unknown  . Percocet [Oxycodone-Acetaminophen]     Hypotension   . Rocephin [Ceftriaxone] Nausea Only  . Rosuvastatin Dermatitis  . Sulfa Antibiotics Hives and Itching  . Wellbutrin [Bupropion Hcl] Other (See Comments)    Emotional, makes depression worse  . Doxycycline Hyclate Itching and Rash  . Erythromycin Base Itching and Rash  . Prednisone Rash    Medications:  Current outpatient prescriptions:  . acetaminophen (TYLENOL) 500 MG tablet, Take 1,000 mg by mouth every 6 (six) hours as needed. For pain , Disp: , Rfl:  . citalopram (CELEXA) 20 MG tablet, 1 tab by mouth twice daily (Patient taking differently: Take 40 mg by mouth at bedtime. ), Disp: 60 tablet, Rfl: 6 . docusate sodium (COLACE) 50 MG capsule, Take 50 mg by mouth daily as  needed for mild constipation., Disp: , Rfl:  . ibuprofen (ADVIL,MOTRIN) 200 MG tablet, Take 800 mg by mouth daily as needed. For cramps, Disp: , Rfl:  . metoprolol tartrate (LOPRESSOR) 25 MG tablet, Take 25 mg by mouth 2 (two) times daily., Disp: , Rfl:  . omeprazole (PRILOSEC) 20 MG capsule, Take 20 mg by mouth daily., Disp: , Rfl:  . simvastatin (ZOCOR) 20 MG tablet, Take 1 tablet (20 mg total) by mouth at bedtime., Disp: 90 tablet, Rfl: 3 . triamcinolone cream (KENALOG) 0.1 %, Apply 1 application topically 2 (two) times daily., Disp: 30 g, Rfl: 2  Review of Systems:   Review of Systems  Constitutional: Negative for fever, chills, weight loss, malaise/fatigue and diaphoresis.  HENT: Negative for hearing loss, ear pain, nosebleeds, congestion, sore throat, neck pain, tinnitus and  ear discharge.  Eyes: Negative for blurred vision, double vision, photophobia, pain, discharge and redness.  Respiratory: Negative for cough, hemoptysis, sputum production, shortness of breath, wheezing and stridor.  Cardiovascular: Negative for chest pain, palpitations, orthopnea, claudication, leg swelling and PND.  Gastrointestinal: Positive for abdominal pain. Negative for heartburn, nausea, vomiting, diarrhea, constipation, blood in stool and melena.  Genitourinary: Negative for dysuria, urgency, frequency, hematuria and flank pain.  Musculoskeletal: Negative for myalgias, back pain, joint pain and falls.  Skin: Negative for itching and rash.  Neurological: Negative for dizziness, tingling, tremors, sensory change, speech change, focal weakness, seizures, loss of consciousness, weakness and headaches.  Endo/Heme/Allergies: Negative for environmental allergies and polydipsia. Does not bruise/bleed easily.  Psychiatric/Behavioral: Negative for depression, suicidal ideas, hallucinations, memory loss and substance abuse. The patient is not nervous/anxious and does not have insomnia.     PHYSICAL EXAM:  Blood pressure 110/70, pulse 62, height 5\' 5"  (1.651 m), weight 239 lb (108.41 kg), last menstrual period 02/10/2015.   Vitals reviewed. Constitutional: She is oriented to person, place, and time. She appears well-developed and well-nourished.  HENT:  Head: Normocephalic and atraumatic.  Right Ear: External ear normal.  Left Ear: External ear normal.  Nose: Nose normal.  Mouth/Throat: Oropharynx is clear and moist.  Eyes: Conjunctivae and EOM are normal. Pupils are equal, round, and reactive to light. Right eye exhibits no discharge. Left eye exhibits no discharge. No scleral icterus.  Neck: Normal range of motion. Neck supple. No tracheal deviation present. No thyromegaly present.  Cardiovascular: Normal rate, regular rhythm, normal heart sounds and intact distal pulses. Exam  reveals no gallop and no friction rub.  No murmur heard. Respiratory: Effort normal and breath sounds normal. No respiratory distress. She has no wheezes. She has no rales. She exhibits no tenderness.  GI: Soft. Bowel sounds are normal. She exhibits no distension and no mass. There is tenderness. There is no rebound and no guarding.  Genitourinary:   Vulva is normal without lesions Vagina is pink moist without discharge Cervix normal in appearance and pap is normal Uterus is per sonogram Adnexa per sonogram Musculoskeletal: Normal range of motion. She exhibits no edema and no tenderness.  Neurological: She is alert and oriented to person, place, and time. She has normal reflexes. She displays normal reflexes. No cranial nerve deficit. She exhibits normal muscle tone. Coordination normal.  Skin: Skin is warm and dry. No rash noted. No erythema. No pallor.  Psychiatric: She has a normal mood and affect. Her behavior is normal. Judgment and thought content normal.    Labs: No results found for this or any previous visit (from the past 336 hour(s)).  EKG: Orders  placed or performed during the hospital encounter of 08/06/14  . EKG 12-Lead  . EKG 12-Lead  . EKG 12 lead  . EKG 12 lead    Imaging Studies:  Imaging Results    US Transvaginal Non-ob  02/26/2015 GYNECOLOGIC SONOGRAM Cassandra Mcconnell is a 49 y.o.LMP 02/10/2015 for a pelvic sonogram for f/u rt adnexal fluid collection and cyst seen on CT. Uterus  7.1 x 5.3 x 5.1 cm, :retroverted uterus with a 1.5 x 1.9 x 1.8 cm fundal fibroid Endometrium 7.7 mm, symmetrical, wnl Right ovary 3.8 x 2.3 x 2.9 cm, ,normal rt ov with a 5.9 x 4.8 x 4.4 cm simple cyst adjacent to rt ov,rt ov does NOT appear to be mobile Left ovary 2.8 x 2 x 2.4 cm, wnl Mult small nabothian simple cysts Technician Comments: PELVIS TA/TV :retroverted uterus with a 1.5 x 1.9 x 1.8 cm fundal fibroid ,EEC  7.79mm,normal lt ov (mobile),normal rt ov with a 5.9 x 4.8 x 4.4 cm simple cyst adjacent to rt ov,rt ov does NOT appear to be mobile,no free fluid,rt adnexal pain during ultrasound [employer] 02/26/2015 9:18 AM Clinical Impression and recommendations: I have reviewed the sonogram results above, combined with the patient's current clinical course, below are my impressions and any appropriate recommendations for management based on the sonographic findings. Right ovarian/paratubal/hydrosalpinx is stable appears simple benign. No other associated concerning findings Uterus is normal and normal endometrium in a menstruating woman Left ovary is normal Clifton Safley Mcconnell 02/26/2015 10:54 AM   US Pelvis Complete  02/26/2015 GYNECOLOGIC SONOGRAM Cassandra Mcconnell is a 49 y.o.LMP 02/10/2015 for a pelvic sonogram for f/u rt adnexal fluid collection and cyst seen on CT. Uterus  7.1 x 5.3 x 5.1 cm, :retroverted uterus with a 1.5 x 1.9 x 1.8 cm fundal fibroid Endometrium 7.7 mm, symmetrical, wnl Right ovary 3.8 x 2.3 x 2.9 cm, ,normal rt ov with a 5.9 x 4.8 x 4.4 cm simple cyst adjacent to rt ov,rt ov does NOT appear to be mobile Left ovary 2.8 x 2 x 2.4 cm, wnl Mult small nabothian simple cysts Technician Comments: PELVIS TA/TV :retroverted uterus with a 1.5 x 1.9 x 1.8 cm fundal fibroid ,EEC 7.56mm,normal lt ov (mobile),normal rt ov with a 5.9 x 4.8 x 4.4 cm simple cyst adjacent to rt ov,rt ov does NOT appear to be mobile,no free fluid,rt adnexal pain during ultrasound [employer] 02/26/2015 9:18 AM Clinical Impression and recommendations: I have reviewed the sonogram results above, combined with the patient's current clinical course, below are my impressions and any appropriate recommendations for management based on the sonographic findings. Right ovarian/paratubal/hydrosalpinx is stable appears simple benign. No other associated concerning findings Uterus is normal and  normal endometrium in a menstruating woman Left ovary is normal Julius Matus Mcconnell 02/26/2015 10:54 AM       Assessment: Right ovarian paraovarian mass RLQ pain Dyspareunia Patient Active Problem List   Diagnosis Date Noted  . Cutaneous skin tags 02/18/2015  . Complex ovarian cyst 10/15/2014  . Diverticulitis 10/12/2014  . Severe obesity (BMI >= 40) ([place]) 05/26/2014  . Depression 05/31/2013  . Morbid obesity ([place]) 12/04/2009  . PAIN IN THORACIC SPINE 11/11/2008  . 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 HYPERTROPHY, BREAST 11/20/2006  . Anxiety state 11/10/2006  . GERD 11/10/2006  . HYPERCHOLESTEROLEMIA, PURE 11/09/2006  . HYPERTENSION, BENIGN ESSENTIAL 11/09/2006  . CHOLECYSTECTOMY, HX OF 11/09/2006    Plan: Laparosocpic BSO  Teleshia Lemere Mcconnell 02/26/2015 11:06 AM  Florian Buff, MD 03/11/2015 8:38 AM

## 2015-03-11 NOTE — Op Note (Signed)
Preoperative diagnosis: 1. Right adnexal mass, paraovarian versus ovarian                                         2. Right lower quadrant pain                                         3. Right sided dyspareunia  Postoperative diagnosis: Right paraovarian mass, appears benign +                                          90 rotation of the right ovarian vasculature, not true torsion +                                          90 rotation of the uterus right side left side down as a result of the right adnexal                                           Mass                                           Severe adhesions of the ovaries to the pelvic sidewall and uterus  Procedure:  Bilateral salpingo-oophorectomy with removal of right paraovarian mass  Surgeon:  Florian Buff, MD  Anesthesia: Gen. Endotracheal  Findings: Preoperatively we noted that the patient had a 4 to have to 5 cm simple cystic mass on the right was consistent with a paraovarian mass.  However the patient has been having increasing pain in the right lower quadrant pain with intercourse in the right lower quadrant as well.  Because of her symptoms we proceeded with surgical evaluation and management.  The above-noted findings were seen.  Again interestingly the right adnexal mass pedicle had a 90 turn in it certainly not portion they were cut off the blood supply but I'm sure this was a significant source of pain.  Additionally the uterus was 90 turn and is aware that the right side and the left side down.  The left ovary was also stuck to the left pelvic sidewall on the posterior wall of the uterus but was otherwise normal.  Description of operation: Patient was taken to the operating room and placed in the supine position where she underwent general endotracheal anesthesia.  She was placed in the low lithotomy position and prepped and draped in usual sterile fashion.  A Foley catheter was placed.  An incision was made in the umbilicus  carried down sharply the rectus fascia.  The retroflex fascia was grasped with a Coker clamp and incised with scissors. I punctured the peritoneum with my finger without difficulty.  There were omental adhesions on the right side of the umbilicus.  The trocar was then placed into the peritoneal cavity non-bladed 100] visualization without difficulty.  Camera was placed. No cavity was insufflated.  2-5 mm trochars were then placed one in the right and one in the left lower quadrant under direct visualization without difficulty.  Attention was turned to the right adnexal mass.  Please refer to the findings stated above for details.  I used large hemoclips to the back clamp the infundibulopelvic ligament and the ovarian blood supply.  The harmonic scalpel was used and the infundibulopelvic ligament was ligated.  The harmonic scalpel was then used to ligate the utero-ovarian ligament.  The right adnexa was dissected off the pelvic sidewall bluntly and using the harmonic scalpel.  There was good hemostasis of the pedicles.  Attention was then turned to the left adnexa.  The left ovary and tube were adherent to the left pelvic sidewall as well as the posterior wall of the uterus.  The harmonic scalpel was used after large hemoclips had been placed on the infundibulopelvic ligament.  The pedicle was taken down in several bites.  The utero-ovarian ligament was transected and ablated as well.  Again all pedicles were found to be hemostatic.  A 5 mm camera was placed in the left lower quadrant and an Endo Catch bags were used to remove each specimen separately and they were sent separately to pathology.  All the pedicles were once again evaluated and found to be hemostatic.  The antrum was removed and the gas was allowed to escape.  The umbilical fascia was closed using 0 Vicryl running.  The subcutaneous tissue of the umbilicus was reapproximated.  All 3 skin incisions were closed using skin staples.    Exparel Was  injected in all 3 incision sites  The patient received Cipro and Cleocin and Toradol preoperatively.  She was awakened from anesthesia and taken recovery in good stable condition ARE correct 3  Estimated blood loss was minimal  Florian Buff, MD 03/11/2015 11:05 AM

## 2015-03-11 NOTE — Transfer of Care (Signed)
Immediate Anesthesia Transfer of Care Note  Patient: Cassandra Mcconnell  Procedure(s) Performed: Procedure(s): LAPAROSCOPIC BILATERAL SALPINGO OOPHORECTOMY (Bilateral)  Patient Location: PACU  Anesthesia Type:General  Level of Consciousness: awake and patient cooperative  Airway & Oxygen Therapy: Patient Spontanous Breathing and non-rebreather face mask  Post-op Assessment: Report given to RN, Post -op Vital signs reviewed and stable and Patient moving all extremities  Post vital signs: Reviewed and stable    Complications: No apparent anesthesia complications

## 2015-03-11 NOTE — Progress Notes (Signed)
Dr. Patsey Berthold informed by Golden Pop RN that patient nauseated and c/o pain, Phenergan IV ordered

## 2015-03-11 NOTE — Anesthesia Postprocedure Evaluation (Signed)
Anesthesia Post Note  Patient: Cassandra Mcconnell  Procedure(s) Performed: Procedure(s) (LRB): LAPAROSCOPIC BILATERAL SALPINGO OOPHORECTOMY (Bilateral)  Anesthesia type: General  Patient location: PACU  Post pain: Pain level controlled  Post assessment: Post-op Vital signs reviewed, Patient's Cardiovascular Status Stable, Respiratory Function Stable, Patent Airway, No signs of Nausea or vomiting and Pain level controlled    Post vital signs: Reviewed and stable  Level of consciousness: awake and alert   Complications: No apparent anesthesia complications

## 2015-03-11 NOTE — Discharge Instructions (Signed)
Diagnostic Laparoscopy A diagnostic laparoscopy is a procedure to diagnose diseases in the abdomen. During the procedure, a thin, lighted, pencil-sized instrument called a laparoscope is inserted into the abdomen through an incision. The laparoscope allows your health care provider to look at the organs inside your body. LET YOUR HEALTH CARE PROVIDER KNOW ABOUT:  Any allergies you have.  All medicines you are taking, including vitamins, herbs, eye drops, creams, and over-the-counter medicines.  Previous problems you or members of your family have had with the use of anesthetics.  Any blood disorders you have.  Previous surgeries you have had.  Medical conditions you have. RISKS AND COMPLICATIONS  Generally, this is a safe procedure. However, problems can occur, which may include:  Infection.  Bleeding.  Damage to other organs.  Allergic reaction to the anesthetics used during the procedure. BEFORE THE PROCEDURE  Do not eat or drink anything after midnight on the night before the procedure or as directed by your health care provider.  Ask your health care provider about:  Changing or stopping your regular medicines.  Taking medicines such as aspirin and ibuprofen. These medicines can thin your blood. Do not take these medicines before your procedure if your health care provider instructs you not to.  Plan to have someone take you home after the procedure. PROCEDURE  You may be given a medicine to help you relax (sedative).  You will be given a medicine to make you sleep (general anesthetic).  Your abdomen will be inflated with a gas. This will make your organs easier to see.  Small incisions will be made in your abdomen.  A laparoscope and other small instruments will be inserted into the abdomen through the incisions.  A tissue sample may be removed from an organ in the abdomen for examination.  The instruments will be removed from the abdomen.  The gas will be  released.  The incisions will be closed with stitches (sutures). AFTER THE PROCEDURE  Your blood pressure, heart rate, breathing rate, and blood oxygen level will be monitored often until the medicines you were given have worn off.   This information is not intended to replace advice given to you by your health care provider. Make sure you discuss any questions you have with your health care provider.   Document Released: 07/18/2000 Document Revised: 12/31/2014 Document Reviewed: 11/22/2013 Elsevier Interactive Patient Education 2016 [employer]  

## 2015-03-11 NOTE — Interval H&P Note (Signed)
History and Physical Interval Note:  03/11/2015 8:38 AM  Cassandra Mcconnell  has presented today for surgery, with the diagnosis of ovarian cyst  The various methods of treatment have been discussed with the patient and family. After consideration of risks, benefits and other options for treatment, the patient has consented to  Procedure(s): LAPAROSCOPIC BILATERAL SALPINGO OOPHORECTOMY (Bilateral) as a surgical intervention .  The patient's history has been reviewed, patient examined, no change in status, stable for surgery.  I have reviewed the patient's chart and labs.  Questions were answered to the patient's satisfaction.     Brady Plant H

## 2015-03-11 NOTE — Progress Notes (Signed)
Patient resting comfortably no further complaints of nausea

## 2015-03-11 NOTE — Anesthesia Preprocedure Evaluation (Signed)
Anesthesia Evaluation  Patient identified by MRN, date of birth, ID band Patient awake    Reviewed: Allergy & Precautions, NPO status , Patient's Chart, lab work & pertinent test results  Airway Mallampati: II  TM Distance: >3 FB     Dental  (+) Teeth Intact   Pulmonary neg pulmonary ROS,    breath sounds clear to auscultation       Cardiovascular hypertension, Pt. on medications  Rhythm:Regular Rate:Normal     Neuro/Psych PSYCHIATRIC DISORDERS Anxiety Depression    GI/Hepatic GERD  Poorly Controlled,  Endo/Other  Morbid obesity  Renal/GU      Musculoskeletal   Abdominal   Peds  Hematology   Anesthesia Other Findings   Reproductive/Obstetrics                             Anesthesia Physical Anesthesia Plan  ASA: II  Anesthesia Plan: General   Post-op Pain Management:    Induction: Intravenous and Rapid sequence  Airway Management Planned: Oral ETT  Additional Equipment:   Intra-op Plan:   Post-operative Plan: Extubation in OR  Informed Consent: I have reviewed the patients History and Physical, chart, labs and discussed the procedure including the risks, benefits and alternatives for the proposed anesthesia with the patient or authorized representative who has indicated his/her understanding and acceptance.     Plan Discussed with:   Anesthesia Plan Comments:         Anesthesia Quick Evaluation

## 2015-03-11 NOTE — Anesthesia Procedure Notes (Signed)
Procedure Name: Intubation Date/Time: 03/11/2015 9:23 AM Performed by: Vista Deck Pre-anesthesia Checklist: Patient identified, Patient being monitored, Timeout performed, Emergency Drugs available and Suction available Patient Re-evaluated:Patient Re-evaluated prior to inductionOxygen Delivery Method: Circle System Utilized Preoxygenation: Pre-oxygenation with 100% oxygen Intubation Type: IV induction, Rapid sequence and Cricoid Pressure applied Ventilation: Mask ventilation without difficulty Laryngoscope Size: Miller and 2 Grade View: Grade I Tube type: Oral Tube size: 7.0 mm Number of attempts: 1 Airway Equipment and Method: Stylet and Oral airway Placement Confirmation: ETT inserted through vocal cords under direct vision,  positive ETCO2 and breath sounds checked- equal and bilateral Secured at: 21 cm Tube secured with: Tape Dental Injury: Teeth and Oropharynx as per pre-operative assessment

## 2015-03-12 ENCOUNTER — Encounter (HOSPITAL_COMMUNITY): Payer: Self-pay | Admitting: Obstetrics & Gynecology

## 2015-03-16 ENCOUNTER — Telehealth: Payer: Self-pay | Admitting: *Deleted

## 2015-03-16 ENCOUNTER — Encounter: Payer: Self-pay | Admitting: Obstetrics & Gynecology

## 2015-03-16 ENCOUNTER — Ambulatory Visit (INDEPENDENT_AMBULATORY_CARE_PROVIDER_SITE_OTHER): Payer: Commercial Managed Care - HMO | Admitting: Obstetrics & Gynecology

## 2015-03-16 VITALS — BP 120/60 | HR 72 | Wt 243.0 lb

## 2015-03-16 DIAGNOSIS — Z9889 Other specified postprocedural states: Secondary | ICD-10-CM

## 2015-03-16 MED ORDER — TRIAMTERENE-HCTZ 37.5-25 MG PO CAPS: 1.0000 | ORAL_CAPSULE | Freq: Every day | ORAL | Status: DC

## 2015-03-16 MED ORDER — ESTRADIOL 1 MG/GM TD GEL: 1.0000 | Freq: Every day | TRANSDERMAL | Status: DC

## 2015-03-16 MED ORDER — PROGESTERONE MICRONIZED 200 MG PO CAPS: 200.0000 mg | ORAL_CAPSULE | Freq: Every day | ORAL | Status: DC

## 2015-03-16 NOTE — Telephone Encounter (Signed)
Pt states she woke up yesterday with 8 lbs of fluid, "huge bruising on stomach," dry mouth, trouble swallowing. Pt has surgery on 03/11/2015, Laparoscopic Bilateral Salpingectomy Oophorectomy. Pt given an appt today with Dr. Elonda Husky for evaluation.

## 2015-03-16 NOTE — Progress Notes (Signed)
Patient ID: Cassandra Mcconnell, female   DOB: 12-Aug-1965, 49 y.o.   MRN: [id number]    HPI: Patient returns for routine postoperative follow-up having undergone laparoscopic BSO on 03/10/2105.  The patient's immediate postoperative recovery has been unremarkable. Since hospital discharge the patient reports bruising swelling.   Current Outpatient Prescriptions: acetaminophen (TYLENOL) 500 MG tablet, Take 1,000 mg by mouth every 6 (six) hours as needed. For pain , Disp: , Rfl:  citalopram (CELEXA) 20 MG tablet, 1 tab by mouth twice daily (Patient taking differently: Take 40 mg by mouth at bedtime. ), Disp: 60 tablet, Rfl: 6 docusate sodium (COLACE) 50 MG capsule, Take 50 mg by mouth daily as needed for mild constipation., Disp: , Rfl:  HYDROmorphone (DILAUDID) 2 MG tablet, Take 1 tablet (2 mg total) by mouth every 4 (four) hours as needed for severe pain., Disp: 30 tablet, Rfl: 0 ibuprofen (ADVIL,MOTRIN) 200 MG tablet, Take 800 mg by mouth daily as needed. For cramps, Disp: , Rfl:  ketorolac (TORADOL) 10 MG tablet, Take 1 tablet (10 mg total) by mouth every 8 (eight) hours as needed., Disp: 15 tablet, Rfl: 0 metoprolol tartrate (LOPRESSOR) 25 MG tablet, Take 25 mg by mouth 2 (two) times daily., Disp: , Rfl:  omeprazole (PRILOSEC) 20 MG capsule, Take 20 mg by mouth daily., Disp: , Rfl:  promethazine (PHENERGAN) 25 MG tablet, Take 1 tablet (25 mg total) by mouth every 6 (six) hours as needed for nausea., Disp: 30 tablet, Rfl: 1 simvastatin (ZOCOR) 20 MG tablet, Take 1 tablet (20 mg total) by mouth at bedtime., Disp: 90 tablet, Rfl: 3 triamcinolone cream (KENALOG) 0.1 %, Apply 1 application topically 2 (two) times daily., Disp: 30 g, Rfl: 2 Estradiol 1 MG/GM GEL, Place 1 packet onto the skin daily at 6 (six) AM., Disp: 30 g, Rfl: 11 progesterone (PROMETRIUM) 200 MG capsule, Take 1 capsule (200 mg total) by mouth daily., Disp: 30 capsule, Rfl: 11 triamterene-hydrochlorothiazide (DYAZIDE) 37.5-25 MG  capsule, Take 1 each (1 capsule total) by mouth daily., Disp: 14 capsule, Rfl: 0  No current facility-administered medications for this visit.    Blood pressure 120/60, pulse 72, weight 243 lb (110.224 kg), last menstrual period 02/10/2015.  Physical Exam: Incisions x 3 clean dry intact staples removed, central abdominal bruising  Diagnostic Tests:   Pathology: benign  Impression: S/p laparoscopic BSO  Plan: Dyazide, prometrium, divigel 1mg  for menopausal symptoms  Follow up: 3  months  Florian Buff, MD

## 2015-03-17 ENCOUNTER — Telehealth: Payer: Self-pay | Admitting: *Deleted

## 2015-03-17 ENCOUNTER — Telehealth: Payer: Self-pay | Admitting: Obstetrics & Gynecology

## 2015-03-17 NOTE — Telephone Encounter (Signed)
Pt states she can not afford Divigel, needs alternative. Also pt did pick up Progesterone but will not be able to afford in the future, is there an alternative?

## 2015-03-18 ENCOUNTER — Telehealth: Payer: Self-pay | Admitting: Obstetrics & Gynecology

## 2015-03-18 ENCOUNTER — Encounter: Payer: Commercial Managed Care - HMO | Admitting: Obstetrics & Gynecology

## 2015-03-18 MED ORDER — MEDROXYPROGESTERONE ACETATE 2.5 MG PO TABS: 2.5000 mg | ORAL_TABLET | Freq: Every day | ORAL | Status: DC

## 2015-03-18 MED ORDER — ESTRADIOL 0.1 MG/24HR TD PTTW: 1.0000 | MEDICATED_PATCH | TRANSDERMAL | Status: DC

## 2015-04-07 ENCOUNTER — Other Ambulatory Visit: Payer: Self-pay | Admitting: Obstetrics & Gynecology

## 2015-04-07 ENCOUNTER — Encounter: Payer: Self-pay | Admitting: Family Medicine

## 2015-04-07 ENCOUNTER — Other Ambulatory Visit: Payer: Self-pay | Admitting: Family Medicine

## 2015-04-15 DIAGNOSIS — H5210 Myopia, unspecified eye: Secondary | ICD-10-CM | POA: Diagnosis not present

## 2015-04-15 DIAGNOSIS — Z0100 Encounter for examination of eyes and vision without abnormal findings: Secondary | ICD-10-CM | POA: Diagnosis not present

## 2015-04-15 DIAGNOSIS — H5203 Hypermetropia, bilateral: Secondary | ICD-10-CM | POA: Diagnosis not present

## 2015-04-17 ENCOUNTER — Telehealth: Payer: Self-pay | Admitting: Obstetrics & Gynecology

## 2015-04-17 MED ORDER — KETOROLAC TROMETHAMINE 10 MG PO TABS: 10.0000 mg | ORAL_TABLET | Freq: Three times a day (TID) | ORAL | Status: DC | PRN

## 2015-04-17 NOTE — Telephone Encounter (Addendum)
For now do nothing and we will see how it goes  Pt requesting refill on Toradol 10 mg. Verbal order for Toradol given per Dr. Elonda Husky and completed in [place]. Pt aware.

## 2015-04-17 NOTE — Telephone Encounter (Signed)
Pt states cannot take HRT causing severe back pain and cramping, pain extends to back, and now having vaginal bleeding x 4-5 days. Pt states has stopped Vivelle dot and provera due to symptoms. No longer having hot flashes. Please advise.

## 2015-05-07 ENCOUNTER — Ambulatory Visit (INDEPENDENT_AMBULATORY_CARE_PROVIDER_SITE_OTHER): Payer: Commercial Managed Care - HMO | Admitting: Family Medicine

## 2015-05-07 ENCOUNTER — Encounter: Payer: Self-pay | Admitting: Family Medicine

## 2015-05-07 VITALS — BP 118/76 | HR 69 | Temp 97.9°F | Wt 234.0 lb

## 2015-05-07 DIAGNOSIS — I10 Essential (primary) hypertension: Secondary | ICD-10-CM | POA: Diagnosis not present

## 2015-05-07 MED ORDER — METOPROLOL TARTRATE 25 MG PO TABS: 25.0000 mg | ORAL_TABLET | Freq: Two times a day (BID) | ORAL | Status: DC

## 2015-05-07 NOTE — Progress Notes (Signed)
Subjective:   Patient ID: Cassandra Mcconnell, female    DOB: 1966/03/26, 50 y.o.   MRN: [id number]  SELAMAWIT EBEL is a pleasant 50 y.o. year old female who presents to clinic today with Follow-up  on 05/07/2015  HPI:  HTN- well controlled Lopressor 25 mg twice daily. Denies HA, blurred vision, CP or SOB.  Lab Results  Component Value Date   CREATININE 0.70 03/09/2015   Current Outpatient Prescriptions on File Prior to Visit  Medication Sig Dispense Refill  . acetaminophen (TYLENOL) 500 MG tablet Take 1,000 mg by mouth every 6 (six) hours as needed. For pain     . citalopram (CELEXA) 20 MG tablet TAKE ONE TABLET BY MOUTH TWICE DAILY. 60 tablet 2  . docusate sodium (COLACE) 50 MG capsule Take 50 mg by mouth daily as needed for mild constipation.    Marland Kitchen ibuprofen (ADVIL,MOTRIN) 200 MG tablet Take 800 mg by mouth daily as needed. For cramps    . ketorolac (TORADOL) 10 MG tablet Take 1 tablet (10 mg total) by mouth every 8 (eight) hours as needed. 15 tablet 0  . omeprazole (PRILOSEC) 20 MG capsule Take 20 mg by mouth daily.    . simvastatin (ZOCOR) 20 MG tablet Take 1 tablet (20 mg total) by mouth at bedtime. 90 tablet 3  . triamcinolone cream (KENALOG) 0.1 % Apply 1 application topically 2 (two) times daily. 30 g 2   No current facility-administered medications on file prior to visit.    Allergies  Allergen Reactions  . Penicillins Shortness Of Breath    Pt tolerated ceftriaxone given in ED 10/12/14  . Zofran [Ondansetron Hcl] Shortness Of Breath    Numbness,  . Azithromycin Swelling  . Cimetidine Other (See Comments)    unknown  . Conjugated Estrogens Other (See Comments)    emotional  . Cyclobenzaprine Hcl     REACTION: disorientation  . Dicyclomine Hcl Other (See Comments)    unknown  . Flagyl [Metronidazole] Nausea Only  . Lisinopril     REACTION: RASH AND MOUTH SORE  . Norco [Hydrocodone-Acetaminophen] Other (See Comments)    Felt like she was going through withdrawal  .  Paroxetine Other (See Comments)    Unknown  . Percocet [Oxycodone-Acetaminophen]     Hypotension   . Rocephin [Ceftriaxone] Nausea Only  . Rosuvastatin Dermatitis  . Sulfa Antibiotics Hives and Itching  . Wellbutrin [Bupropion Hcl] Other (See Comments)    Emotional, makes depression worse  . Doxycycline Hyclate Itching and Rash  . Erythromycin Base Itching and Rash  . Prednisone Rash    Past Medical History  Diagnosis Date  . Anxiety   . GERD (gastroesophageal reflux disease)   . Hyperlipidemia   . Hypertension   . Wears glasses   . Depression   . Back pain   . Diverticulitis     Past Surgical History  Procedure Laterality Date  . Septoplasty  1988  . Tonsillectomy  1991  . Cholecystectomy      1998  . Back surgery  2004    l4/5 fusion  . Breast reduction surgery Bilateral 08/08/2014    Procedure: BILATERAL BREAST REDUCTION  (BREAST);  Surgeon: Irene Limbo, MD;  Location: [place];  Service: Plastics;  Laterality: Bilateral;  . Laparoscopic bilateral salpingo oopherectomy Bilateral 03/11/2015    Procedure: LAPAROSCOPIC BILATERAL SALPINGO OOPHORECTOMY;  Surgeon: Florian Buff, MD;  Location: [facility];  Service: Gynecology;  Laterality: Bilateral;    Family History  Problem Relation Age of Onset  . COPD Mother   . Hypertension Mother   . GER disease Mother   . Heart attack Father   . Cancer Father     prostate  . Hyperlipidemia Brother   . Hypertension Brother   . Diabetes Brother   . Heart attack Paternal Grandmother   . Heart attack Paternal Grandfather   . Stroke Paternal Grandfather   . Hyperlipidemia Brother   . Hypertension Brother     Social History   Social History  . Marital Status: Married    Spouse Name: N/A  . Number of Children: 1  . Years of Education: N/A   Occupational History  . stay at home mom    Social History Main Topics  . Smoking status: Never Smoker   . Smokeless tobacco: Never Used  . Alcohol Use: No    . Drug Use: No  . Sexual Activity: Not on file   Other Topics Concern  . Not on file   Social History Narrative   The PMH, PSH, Social History, Family History, Medications, and allergies have been reviewed in [facility], and have been updated if relevant.   Review of Systems  Constitutional: Negative.   Respiratory: Negative.   Cardiovascular: Negative.   Musculoskeletal: Negative.   Skin: Negative.   Hematological: Negative.   All other systems reviewed and are negative.      Objective:    BP 118/76 mmHg  Pulse 69  Temp(Src) 97.9 F (36.6 C) (Oral)  Wt 234 lb (106.142 kg)  SpO2 97%  LMP 02/10/2015   Physical Exam  Constitutional: She is oriented to person, place, and time. She appears well-developed and well-nourished. No distress.  HENT:  Head: Normocephalic.  Eyes: Conjunctivae are normal.  Cardiovascular: Normal rate and regular rhythm.   Pulmonary/Chest: Effort normal and breath sounds normal.  Musculoskeletal: She exhibits no edema.  Neurological: She is alert and oriented to person, place, and time. No cranial nerve deficit.  Skin: Skin is warm and dry. She is not diaphoretic. No erythema.  Psychiatric: She has a normal mood and affect. Her behavior is normal. Judgment and thought content normal.  Nursing note and vitals reviewed.         Assessment & Plan:   HYPERTENSION, BENIGN ESSENTIAL No Follow-up on file.

## 2015-05-07 NOTE — Assessment & Plan Note (Signed)
Well controlled on current dose of Toprol. eRx sent.

## 2015-06-16 ENCOUNTER — Ambulatory Visit: Payer: Commercial Managed Care - HMO | Admitting: Obstetrics & Gynecology

## 2015-07-16 ENCOUNTER — Other Ambulatory Visit: Payer: Self-pay

## 2015-07-16 MED ORDER — CITALOPRAM HYDROBROMIDE 20 MG PO TABS: 20.0000 mg | ORAL_TABLET | Freq: Two times a day (BID) | ORAL | Status: DC

## 2015-07-16 NOTE — Telephone Encounter (Signed)
Pt left v/m requesting refill celexa for anxiety to [facility]; last seen for anxiey 07/23/14 and last f/u 05/07/15. Last refilled # 60 x 2 on 04/08/15. Pt will be out of me on 07/17/15. Pt request cb when refilled.

## 2015-09-14 ENCOUNTER — Other Ambulatory Visit: Payer: Self-pay | Admitting: Family Medicine

## 2015-09-22 ENCOUNTER — Emergency Department (HOSPITAL_COMMUNITY)
Admission: EM | Admit: 2015-09-22 | Discharge: 2015-09-22 | Disposition: A | Discharge status: Home / Self Care | Payer: Commercial Managed Care - HMO | Attending: Emergency Medicine | Admitting: Emergency Medicine

## 2015-09-22 ENCOUNTER — Encounter (HOSPITAL_COMMUNITY): Payer: Self-pay | Admitting: Emergency Medicine

## 2015-09-22 DIAGNOSIS — R29810 Facial weakness: Secondary | ICD-10-CM | POA: Diagnosis present

## 2015-09-22 DIAGNOSIS — F329 Major depressive disorder, single episode, unspecified: Secondary | ICD-10-CM | POA: Diagnosis not present

## 2015-09-22 DIAGNOSIS — Z791 Long term (current) use of non-steroidal anti-inflammatories (NSAID): Secondary | ICD-10-CM | POA: Diagnosis not present

## 2015-09-22 DIAGNOSIS — E785 Hyperlipidemia, unspecified: Secondary | ICD-10-CM | POA: Insufficient documentation

## 2015-09-22 DIAGNOSIS — I10 Essential (primary) hypertension: Secondary | ICD-10-CM | POA: Diagnosis not present

## 2015-09-22 DIAGNOSIS — G510 Bell's palsy: Secondary | ICD-10-CM | POA: Diagnosis not present

## 2015-09-22 HISTORY — DX: Bell's palsy: G51.0

## 2015-09-22 MED ORDER — VALACYCLOVIR HCL 1 G PO TABS: 1000.0000 mg | ORAL_TABLET | Freq: Three times a day (TID) | ORAL | Status: AC

## 2015-09-22 MED ORDER — PREDNISONE 20 MG PO TABS: 60.0000 mg | ORAL_TABLET | Freq: Every day | ORAL | Status: DC

## 2015-09-22 NOTE — ED Notes (Signed)
Pt reports LT sided facial droop, LT sided otalgia, and HA that began last night. Denies any numbness/weakness to extremities. Pt hx of Bell's Palsy. States it is brought on by stress.

## 2015-09-22 NOTE — ED Provider Notes (Signed)
CSN: [id number]     Arrival date & time 09/22/15  [id number] History  By signing my name below, I, Higinio Plan, attest that this documentation has been prepared under the direction and in the presence of Elnora Morrison, MD . Electronically Signed: Higinio Plan, Scribe. 09/22/2015. 9:22 AM.   Chief Complaint  Patient presents with  . Facial Droop   The history is provided by the patient. No language interpreter was used.   HPI Comments:  Cassandra Mcconnell is a 50 y.o. female who presents to the Emergency Department complaining of constant, gradually worsening, left sided facial droop that she woke up with this AM. Pt states her associated symptoms are headache, fatigue and left ear pain. Pt reports that her pain began in her middle ear and was followed by drooping. She reports a h/o Bell's palsy with right sided facial droop. She denies fever, chills, back, chest, abdominal pain, weakness or numbness in arms, and any other complaints. Pt states she is allergic to prednisone.    Past Medical History  Diagnosis Date  . Anxiety   . GERD (gastroesophageal reflux disease)   . Hyperlipidemia   . Hypertension   . Wears glasses   . Depression   . Back pain   . Diverticulitis   . Bell's palsy    Past Surgical History  Procedure Laterality Date  . Septoplasty  1988  . Tonsillectomy  1991  . Cholecystectomy      1998  . Back surgery  2004    l4/5 fusion  . Breast reduction surgery Bilateral 08/08/2014    Procedure: BILATERAL BREAST REDUCTION  (BREAST);  Surgeon: Irene Limbo, MD;  Location: [place];  Service: Plastics;  Laterality: Bilateral;  . Laparoscopic bilateral salpingo oopherectomy Bilateral 03/11/2015    Procedure: LAPAROSCOPIC BILATERAL SALPINGO OOPHORECTOMY;  Surgeon: Florian Buff, MD;  Location: [facility];  Service: Gynecology;  Laterality: Bilateral;   Family History  Problem Relation Age of Onset  . COPD Mother   . Hypertension Mother   . GER disease Mother   .  Heart attack Father   . Cancer Father     prostate  . Hyperlipidemia Brother   . Hypertension Brother   . Diabetes Brother   . Heart attack Paternal Grandmother   . Heart attack Paternal Grandfather   . Stroke Paternal Grandfather   . Hyperlipidemia Brother   . Hypertension Brother    Social History  Substance Use Topics  . Smoking status: Never Smoker   . Smokeless tobacco: Never Used  . Alcohol Use: No   OB History    No data available     Review of Systems  Constitutional: Positive for fatigue. Negative for fever and chills.  Cardiovascular: Negative for chest pain.  Gastrointestinal: Negative for abdominal pain.  Musculoskeletal: Negative for back pain.  Neurological: Positive for facial asymmetry and headaches. Negative for weakness and numbness.  All other systems reviewed and are negative.   Allergies  Penicillins; Zofran; Azithromycin; Cimetidine; Conjugated estrogens; Cyclobenzaprine hcl; Dicyclomine hcl; Flagyl; Lisinopril; Norco; Paroxetine; Percocet; Rocephin; Rosuvastatin; Sulfa antibiotics; Wellbutrin; Doxycycline hyclate; Erythromycin base; and Prednisone  Home Medications   Prior to Admission medications   Medication Sig Start Date End Date Taking? Authorizing Provider  acetaminophen (TYLENOL) 500 MG tablet Take 1,000 mg by mouth every 6 (six) hours as needed. For pain     Historical Provider, MD  citalopram (CELEXA) 20 MG tablet Take 1 tablet (20 mg total) by mouth 2 (two) times  daily. 07/16/15   Lucille Passy, MD  docusate sodium (COLACE) 50 MG capsule Take 50 mg by mouth daily as needed for mild constipation.    Historical Provider, MD  ibuprofen (ADVIL,MOTRIN) 200 MG tablet Take 800 mg by mouth daily as needed. For cramps    Historical Provider, MD  ketorolac (TORADOL) 10 MG tablet Take 1 tablet (10 mg total) by mouth every 8 (eight) hours as needed. 04/17/15   Florian Buff, MD  metoprolol tartrate (LOPRESSOR) 25 MG tablet Take 1 tablet (25 mg total) by  mouth 2 (two) times daily. 05/07/15   Lucille Passy, MD  omeprazole (PRILOSEC) 20 MG capsule Take 20 mg by mouth daily.    Historical Provider, MD  predniSONE (DELTASONE) 20 MG tablet Take 3 tablets (60 mg total) by mouth daily with breakfast. 09/22/15   Elnora Morrison, MD  simvastatin (ZOCOR) 20 MG tablet Take 1 tablet (20 mg total) by mouth at bedtime. OFFICE VISIT REQUIRED WITH LABS REQUIRED FOR ADDITIONAL REFILLS 09/14/15   Lucille Passy, MD  triamcinolone cream (KENALOG) 0.1 % Apply 1 application topically 2 (two) times daily. 10/15/14   Lucille Passy, MD  valACYclovir (VALTREX) 1000 MG tablet Take 1 tablet (1,000 mg total) by mouth 3 (three) times daily. 09/22/15 09/28/15  Elnora Morrison, MD   BP 139/59 mmHg  Pulse 75  Temp(Src) 98.4 F (36.9 C) (Oral)  Resp 20  Ht 5\' 6"  (1.676 m)  Wt 237 lb (107.502 kg)  BMI 38.27 kg/m2  SpO2 100%  LMP 02/10/2015 Physical Exam  Constitutional: She is oriented to person, place, and time. She appears well-developed and well-nourished.  HENT:  Head: Normocephalic and atraumatic.  Eyes: Conjunctivae and EOM are normal. Pupils are equal, round, and reactive to light. Right eye exhibits no discharge. Left eye exhibits no discharge.  Neck: Normal range of motion. Neck supple. No tracheal deviation present.  Cardiovascular: Normal rate and regular rhythm.   Pulmonary/Chest: Effort normal and breath sounds normal.  Abdominal: Soft. She exhibits no distension. There is no tenderness. There is no guarding.  Musculoskeletal: She exhibits no edema.  Neurological: She is alert and oriented to person, place, and time.  Visual fields intact to fingers bilateral eyes  Finger nose intact  No arms drift  Facial droop on the left  Inability to wrinkle forehead on left  Equal strength in BUE and BLE  Sensation intact bilateral extremities  Normal finger to nose   Skin: Skin is warm. No rash noted.  Psychiatric: She has a normal mood and affect.  Nursing note and vitals  reviewed.   ED Course  Procedures  DIAGNOSTIC STUDIES:  Oxygen Saturation is 100% on RA,normal by my interpretation.    COORDINATION OF CARE:  8:31 AM Discussed treatment plan with pt at bedside and pt agreed to plan. Pt was advised to follow up with her PCP.    MDM   Final diagnoses:  Left-sided Bell's palsy   Well-appearing patient with clinical Bell's palsy. No other neurologic deficits. Discussed supportive care and treatment.  Results and differential diagnosis were discussed with the patient/parent/guardian. Xrays were independently reviewed by myself.  Close follow up outpatient was discussed, comfortable with the plan.   Medications - No data to display  Filed Vitals:   09/22/15 0820 09/22/15 0922  BP: 139/59 130/56  Pulse: 75 70  Temp: 98.4 F (36.9 C)   TempSrc: Oral   Resp: 20   Height: 5\' 6"  (1.676 m)  Weight: 237 lb (107.502 kg)   SpO2: 100% 99%    Final diagnoses:  Left-sided Bell's palsy      Elnora Morrison, MD 09/27/15 1523

## 2015-09-22 NOTE — ED Notes (Signed)
MD at bedside. 

## 2015-09-22 NOTE — Discharge Instructions (Signed)
Try taking the prednisone. If you do not have any significant allergic type reactions then continue for 1 week. Return if you have other neurologic signs or symptoms that would be concerning for stroke such as speech changes weakness or numbness on one side or body, other Purchase saline drops for your eyes.  If you were given medicines take as directed.  If you are on coumadin or contraceptives realize their levels and effectiveness is altered by many different medicines.  If you have any reaction (rash, tongues swelling, other) to the medicines stop taking and see a physician.    If your blood pressure was elevated in the ER make sure you follow up for management with a primary doctor or return for chest pain, shortness of breath or stroke symptoms.  Please follow up as directed and return to the ER or see a physician for new or worsening symptoms.  Thank you. Filed Vitals:   09/22/15 0820  BP: 139/59  Pulse: 75  Temp: 98.4 F (36.9 C)  TempSrc: Oral  Resp: 20  Height: 5\' 6"  (1.676 m)  Weight: 237 lb (107.502 kg)  SpO2: 100%   Bell Palsy Bell palsy is a condition in which the muscles on one side of the face become paralyzed. This often causes one side of the face to droop. It is a common condition and most people recover completely. RISK FACTORS Risk factors for Bell palsy include:  Pregnancy.  Diabetes.  An infection by a virus, such as infections that cause cold sores. CAUSES  Bell palsy is caused by damage to or inflammation of a nerve in your face. It is unclear why this happens, but an infection by a virus may lead to it. Most of the time the reason it happens is unknown. SIGNS AND SYMPTOMS  Symptoms can range from mild to severe and can take place over a number of hours. Symptoms may include:  Being unable to:  Raise one or both eyebrows.  Close one or both eyes.  Feel parts of your face (facial numbness).  Drooping of the eyelid and corner of the  mouth.  Weakness in the face.  Paralysis of half your face.  Loss of taste.  Sensitivity to loud noises.  Difficulty chewing.  Tearing up of the affected eye.  Dryness in the affected eye.  Drooling.  Pain behind one ear. DIAGNOSIS  Diagnosis of Bell palsy may include:  A medical history and physical exam.  An MRI.  A CT scan.  Electromyography (EMG). This is a test that checks how your nerves are working. TREATMENT  Treatment may include antiviral medicine to help shorten the length of the condition. Sometimes treatment is not needed and the symptoms go away on their own. HOME CARE INSTRUCTIONS   Take medicines only as directed by your health care provider.  Do facial massages and exercises as directed by your health care provider.  If your eye is affected:  Use moisturizing eye drops to prevent drying of your eye as directed by your health care provider.  Protect your eye as directed by your health care provider. SEEK MEDICAL CARE IF:  Your symptoms do not get better or get worse.  You are drooling.  Your eye is red, irritated, or hurts. SEEK IMMEDIATE MEDICAL CARE IF:   Another part of your body feels weak or numb.  You have difficulty swallowing.  You have a fever along with symptoms of Bell palsy.  You develop neck pain. MAKE SURE YOU:  Understand these instructions.  Will watch your condition.  Will get help right away if you are not doing well or get worse.   This information is not intended to replace advice given to you by your health care provider. Make sure you discuss any questions you have with your health care provider.   Document Released: 04/11/2005 Document Revised: 12/31/2014 Document Reviewed: 07/19/2013 Elsevier Interactive Patient Education [employer].

## 2015-09-25 ENCOUNTER — Telehealth: Payer: Self-pay | Admitting: Family Medicine

## 2015-09-25 MED ORDER — SIMVASTATIN 20 MG PO TABS: 20.0000 mg | ORAL_TABLET | Freq: Every day | ORAL | Status: DC

## 2015-09-28 NOTE — Telephone Encounter (Signed)
Refill request

## 2015-09-29 ENCOUNTER — Encounter: Payer: Self-pay | Admitting: Family Medicine

## 2015-09-29 ENCOUNTER — Ambulatory Visit (INDEPENDENT_AMBULATORY_CARE_PROVIDER_SITE_OTHER): Payer: Commercial Managed Care - HMO | Admitting: Family Medicine

## 2015-09-29 VITALS — BP 118/78 | HR 60 | Temp 98.0°F | Wt 248.5 lb

## 2015-09-29 DIAGNOSIS — G510 Bell's palsy: Secondary | ICD-10-CM | POA: Insufficient documentation

## 2015-09-29 NOTE — Progress Notes (Signed)
Subjective:   Patient ID: CLELA TAYS, female    DOB: 16-Feb-1966, 50 y.o.   MRN: [id number]  Cassandra Mcconnell is a pleasant 50 y.o. year old female who presents to clinic today with Hospitalization Follow-up  on 09/29/2015  HPI:  Presented to the ER on 09/22/15 with left sided facial droop. Note reviewed.  Symptoms associated with fatigue, headache and left ear pain.  She does have a remote h/o right sided Bells' palsy.  No neurological deficits in ER other than left facial droop and inability to wrinkle forehead on left.  Diagnosed with Bell's Palsy, given rx for Valtrex, prednsione, and advised to follow up with me here today. Also using eye drops from her eye doctor.  She can close her left eye all the way.  Feels symptoms only mildly improved.  Current Outpatient Prescriptions on File Prior to Visit  Medication Sig Dispense Refill  . acetaminophen (TYLENOL) 500 MG tablet Take 1,000 mg by mouth every 6 (six) hours as needed. For pain     . citalopram (CELEXA) 20 MG tablet Take 1 tablet (20 mg total) by mouth 2 (two) times daily. 60 tablet 2  . docusate sodium (COLACE) 50 MG capsule Take 50 mg by mouth daily as needed for mild constipation.    Marland Kitchen ibuprofen (ADVIL,MOTRIN) 200 MG tablet Take 800 mg by mouth daily as needed. For cramps    . ketorolac (TORADOL) 10 MG tablet Take 1 tablet (10 mg total) by mouth every 8 (eight) hours as needed. 15 tablet 0  . metoprolol tartrate (LOPRESSOR) 25 MG tablet Take 1 tablet (25 mg total) by mouth 2 (two) times daily. 60 tablet 6  . omeprazole (PRILOSEC) 20 MG capsule Take 20 mg by mouth daily.    . simvastatin (ZOCOR) 20 MG tablet Take 1 tablet (20 mg total) by mouth at bedtime. OFFICE VISIT REQUIRED WITH LABS REQUIRED FOR ADDITIONAL REFILLS 30 tablet 0  . triamcinolone cream (KENALOG) 0.1 % Apply 1 application topically 2 (two) times daily. 30 g 2   No current facility-administered medications on file prior to visit.    Allergies    Allergen Reactions  . Penicillins Shortness Of Breath    Pt tolerated ceftriaxone given in ED 10/12/14  . Zofran [Ondansetron Hcl] Shortness Of Breath    Numbness,  . Azithromycin Swelling  . Cimetidine Other (See Comments)    unknown  . Conjugated Estrogens Other (See Comments)    emotional  . Cyclobenzaprine Hcl     REACTION: disorientation  . Dicyclomine Hcl Other (See Comments)    unknown  . Flagyl [Metronidazole] Nausea Only  . Lisinopril     REACTION: RASH AND MOUTH SORE  . Norco [Hydrocodone-Acetaminophen] Other (See Comments)    Felt like she was going through withdrawal  . Paroxetine Other (See Comments)    Unknown  . Percocet [Oxycodone-Acetaminophen]     Hypotension   . Rocephin [Ceftriaxone] Nausea Only  . Rosuvastatin Dermatitis  . Sulfa Antibiotics Hives and Itching  . Wellbutrin [Bupropion Hcl] Other (See Comments)    Emotional, makes depression worse  . Doxycycline Hyclate Itching and Rash  . Erythromycin Base Itching and Rash  . Prednisone Rash    Past Medical History  Diagnosis Date  . Anxiety   . GERD (gastroesophageal reflux disease)   . Hyperlipidemia   . Hypertension   . Wears glasses   . Depression   . Back pain   . Diverticulitis   . Bell's palsy  Past Surgical History  Procedure Laterality Date  . Septoplasty  1988  . Tonsillectomy  1991  . Cholecystectomy      1998  . Back surgery  2004    l4/5 fusion  . Breast reduction surgery Bilateral 08/08/2014    Procedure: BILATERAL BREAST REDUCTION  (BREAST);  Surgeon: Irene Limbo, MD;  Location: [place];  Service: Plastics;  Laterality: Bilateral;  . Laparoscopic bilateral salpingo oopherectomy Bilateral 03/11/2015    Procedure: LAPAROSCOPIC BILATERAL SALPINGO OOPHORECTOMY;  Surgeon: Florian Buff, MD;  Location: [facility];  Service: Gynecology;  Laterality: Bilateral;    Family History  Problem Relation Age of Onset  . COPD Mother   . Hypertension Mother   .  GER disease Mother   . Heart attack Father   . Cancer Father     prostate  . Hyperlipidemia Brother   . Hypertension Brother   . Diabetes Brother   . Heart attack Paternal Grandmother   . Heart attack Paternal Grandfather   . Stroke Paternal Grandfather   . Hyperlipidemia Brother   . Hypertension Brother     Social History   Social History  . Marital Status: Married    Spouse Name: N/A  . Number of Children: 1  . Years of Education: N/A   Occupational History  . stay at home mom    Social History Main Topics  . Smoking status: Never Smoker   . Smokeless tobacco: Never Used  . Alcohol Use: No  . Drug Use: No  . Sexual Activity: Not on file   Other Topics Concern  . Not on file   Social History Narrative   The PMH, PSH, Social History, Family History, Medications, and allergies have been reviewed in [facility], and have been updated if relevant.  Review of Systems  Constitutional: Positive for fatigue.  Eyes: Negative.   Respiratory: Negative.   Cardiovascular: Negative.   Musculoskeletal: Negative.   Skin: Negative.   Neurological: Positive for facial asymmetry and headaches. Negative for dizziness, tremors, seizures, syncope, speech difficulty, weakness, light-headedness and numbness.  Hematological: Negative.   Psychiatric/Behavioral: Negative.        Objective:    BP 118/78 mmHg  Pulse 60  Temp(Src) 98 F (36.7 C) (Oral)  Wt 248 lb 8 oz (112.719 kg)  SpO2 98%  LMP 02/10/2015   Physical Exam   General:  Well-developed,well-nourished,in no acute distress; alert,appropriate and cooperative throughout examination Head:  normocephalic and atraumatic.   Facial droop on the left  Inability to wrinkle forehead on left  Lungs:  Normal respiratory effort, chest expands symmetrically. Lungs are clear to auscultation, no crackles or wheezes. Heart:  Normal rate and regular rhythm. S1 and S2 normal without gallop, murmur, click, rub or other extra  sounds. Abdomen:  Bowel sounds positive,abdomen soft and non-tender without masses, organomegaly or hernias noted. Msk:  No deformity or scoliosis noted of thoracic or lumbar spine.   Extremities:  No clubbing, cyanosis, edema, or deformity noted with normal full range of motion of all joints.   Neurologic:  alert & oriented X3 and gait normal.   Skin:  Intact without suspicious lesions or rashes Cervical Nodes:  No lymphadenopathy noted Axillary Nodes:  No palpable lymphadenopathy Psych:  Cognition and judgment appear intact. Alert and cooperative with normal attention span and concentration. No apparent delusions, illusions, hallucinations       Assessment & Plan:   Bell's palsy No Follow-up on file.

## 2015-09-29 NOTE — Assessment & Plan Note (Signed)
S/p course of valtrex and prednisone. Still symptomatic.  Discussed course and prognosis of Bell's Palsy. No additional rx warranted at this time. Referral placed to neurologist. The patient indicates understanding of these issues and agrees with the plan.

## 2015-09-29 NOTE — Patient Instructions (Signed)
Bell Palsy °Bell palsy is a condition in which the muscles on one side of the face become paralyzed. This often causes one side of the face to droop. It is a common condition and most people recover completely. °RISK FACTORS °Risk factors for Bell palsy include: °· Pregnancy. °· Diabetes. °· An infection by a virus, such as infections that cause cold sores. °CAUSES  °Bell palsy is caused by damage to or inflammation of a nerve in your face. It is unclear why this happens, but an infection by a virus may lead to it. Most of the time the reason it happens is unknown. °SIGNS AND SYMPTOMS  °Symptoms can range from mild to severe and can take place over a number of hours. Symptoms may include: °· Being unable to: °¨ Raise one or both eyebrows. °¨ Close one or both eyes. °¨ Feel parts of your face (facial numbness). °· Drooping of the eyelid and corner of the mouth. °· Weakness in the face. °· Paralysis of half your face. °· Loss of taste. °· Sensitivity to loud noises. °· Difficulty chewing. °· Tearing up of the affected eye. °· Dryness in the affected eye. °· Drooling. °· Pain behind one ear. °DIAGNOSIS  °Diagnosis of Bell palsy may include: °· A medical history and physical exam. °· An MRI. °· A CT scan. °· Electromyography (EMG). This is a test that checks how your nerves are working. °TREATMENT  °Treatment may include antiviral medicine to help shorten the length of the condition. Sometimes treatment is not needed and the symptoms go away on their own. °HOME CARE INSTRUCTIONS  °· Take medicines only as directed by your health care provider. °· Do facial massages and exercises as directed by your health care provider. °· If your eye is affected: °¨ Use moisturizing eye drops to prevent drying of your eye as directed by your health care provider. °¨ Protect your eye as directed by your health care provider. °SEEK MEDICAL CARE IF: °· Your symptoms do not get better or get worse. °· You are drooling. °· Your eye is red,  irritated, or hurts. °SEEK IMMEDIATE MEDICAL CARE IF:  °· Another part of your body feels weak or numb. °· You have difficulty swallowing. °· You have a fever along with symptoms of Bell palsy. °· You develop neck pain. °MAKE SURE YOU:  °· Understand these instructions. °· Will watch your condition. °· Will get help right away if you are not doing well or get worse. °  °This information is not intended to replace advice given to you by your health care provider. Make sure you discuss any questions you have with your health care provider. °  °Document Released: 04/11/2005 Document Revised: 12/31/2014 Document Reviewed: 07/19/2013 °Elsevier Interactive Patient Education ©2016 [employer] ° °

## 2015-09-29 NOTE — Progress Notes (Signed)
Pre visit review using our clinic review tool, if applicable. No additional management support is needed unless otherwise documented below in the visit note. 

## 2015-10-01 ENCOUNTER — Ambulatory Visit (INDEPENDENT_AMBULATORY_CARE_PROVIDER_SITE_OTHER): Payer: Commercial Managed Care - HMO | Admitting: Neurology

## 2015-10-01 ENCOUNTER — Encounter: Payer: Self-pay | Admitting: Neurology

## 2015-10-01 VITALS — BP 131/74 | HR 64 | Ht 66.0 in | Wt 244.8 lb

## 2015-10-01 DIAGNOSIS — G519 Disorder of facial nerve, unspecified: Secondary | ICD-10-CM | POA: Diagnosis not present

## 2015-10-01 DIAGNOSIS — R5382 Chronic fatigue, unspecified: Secondary | ICD-10-CM | POA: Diagnosis not present

## 2015-10-01 DIAGNOSIS — G510 Bell's palsy: Secondary | ICD-10-CM

## 2015-10-01 MED ORDER — TRAMADOL HCL 50 MG PO TABS: ORAL_TABLET | ORAL | Status: DC

## 2015-10-01 NOTE — Progress Notes (Signed)
[facility]    Provider:  Dr Jaynee Eagles Referring Provider: Lucille Passy, MD Primary Care Physician:  Arnette Norris, MD  CC:  Left-sided facial droop dxed with Bell's Palsy  HPI:  Cassandra Mcconnell is a 50 y.o. female here as a referral from Dr. Deborra Medina for Bell's palsy. Past medical history of anxiety, hyperlipidemia, hypertension, depression, Bell's palsy. She started getting a headache last Monday and Tuesday she noticed weakness, she woke up Wednesday with weakness and ear pain. No inciting events, no trauma, no rash or other. She is having associated pain in the left ear. Things sound louder. It is giving her a headache. tylenol and ibuprofen not helping. She has a history of remote bell's palsy that cleared up in 2 weeks. Patient went to the ED and she was started on prednisone and valtrex. She is using drops in the eyes to lubricate, she is aware of the risk of drying out and corneal damage, wearing glasses all the time to protect her eyes given decrease in blink rate (discussed all these again and the need for vigilance). She can shut her eye and so her eye is closed all night, no dry eye in the morning. No inciting event or head trauma or illness. Her eye is blurry on the left. She has been stressed. She has left facial droop, difficulty closing the left eye with decreased blink, blurry left eye vision. No other focal neurologic symptoms or complaints.  Reviewed notes, labs and imaging from outside physicians, which showed:  CT of the head in 2009 showed No acute intracranial abnormalities including mass lesion or mass effect, hydrocephalus, extra-axial fluid collection, midline shift, hemorrhage, or acute infarction, large ischemic events (personally reviewed images)  Reviewed records. Patient presented to the emergency room on 09/22/2015. She complained of constant, gradually worsening left-sided facial droop upon awakening that morning. Associated symptoms were headache, fatigue  and left ear pain. The pain in the left ear began followed by draping. She has a history of Bell's palsy with right-sided facial droop in the past. Denied any other complaints. Exam was significant for facial droop on the left, and ability to wrinkle forehead on the left, intact sensation. She was diagnosed with Bell's palsy. Supportive care and treatment was recommended.  Review of Systems: Patient complains of symptoms per HPI as well as the following symptoms: Blurred vision, headache, numbness, weakness, slurred speech, anxiety. Pertinent negatives per HPI. All others negative.   Social History   Social History  . Marital Status: Married    Spouse Name: N/A  . Number of Children: 1  . Years of Education: N/A   Occupational History  . stay at home mom    Social History Main Topics  . Smoking status: Never Smoker   . Smokeless tobacco: Never Used  . Alcohol Use: No  . Drug Use: No  . Sexual Activity: Not on file   Other Topics Concern  . Not on file   Social History Narrative    Family History  Problem Relation Age of Onset  . COPD Mother   . Hypertension Mother   . GER disease Mother   . Heart attack Father   . Cancer Father     prostate  . Hyperlipidemia Brother   . Hypertension Brother   . Diabetes Brother   . Neuropathy Brother   . Heart attack Paternal Grandmother   . Heart attack Paternal Grandfather   . Stroke Paternal Grandfather   . Hyperlipidemia Brother   .  Hypertension Brother     Past Medical History  Diagnosis Date  . Anxiety   . GERD (gastroesophageal reflux disease)   . Hyperlipidemia   . Hypertension   . Wears glasses   . Depression   . Back pain   . Diverticulitis   . Bell's palsy   . Bell's palsy     Past Surgical History  Procedure Laterality Date  . Septoplasty  1988  . Tonsillectomy  1991  . Cholecystectomy      1998  . Back surgery  2004    l4/5 fusion  . Breast reduction surgery Bilateral 08/08/2014    Procedure:  BILATERAL BREAST REDUCTION  (BREAST);  Surgeon: Irene Limbo, MD;  Location: [place];  Service: Plastics;  Laterality: Bilateral;  . Laparoscopic bilateral salpingo oopherectomy Bilateral 03/11/2015    Procedure: LAPAROSCOPIC BILATERAL SALPINGO OOPHORECTOMY;  Surgeon: Florian Buff, MD;  Location: [facility];  Service: Gynecology;  Laterality: Bilateral;    Current Outpatient Prescriptions  Medication Sig Dispense Refill  . acetaminophen (TYLENOL) 500 MG tablet Take 1,000 mg by mouth every 6 (six) hours as needed. For pain     . citalopram (CELEXA) 20 MG tablet Take 1 tablet (20 mg total) by mouth 2 (two) times daily. 60 tablet 2  . citalopram (CELEXA) 20 MG tablet     . docusate sodium (COLACE) 50 MG capsule Take 50 mg by mouth daily as needed for mild constipation.    Marland Kitchen ibuprofen (ADVIL,MOTRIN) 200 MG tablet Take 800 mg by mouth daily as needed. For cramps    . metoprolol tartrate (LOPRESSOR) 25 MG tablet Take 1 tablet (25 mg total) by mouth 2 (two) times daily. 60 tablet 6  . Multiple Vitamin (MULTIVITAMIN) tablet Take 1 tablet by mouth daily.    Marland Kitchen omeprazole (PRILOSEC) 20 MG capsule Take 20 mg by mouth daily.    . simvastatin (ZOCOR) 20 MG tablet Take 1 tablet (20 mg total) by mouth at bedtime. OFFICE VISIT REQUIRED WITH LABS REQUIRED FOR ADDITIONAL REFILLS 30 tablet 0  . triamcinolone cream (KENALOG) 0.1 % Apply 1 application topically 2 (two) times daily. 30 g 2  . traMADol (ULTRAM) 50 MG tablet Tramadol 1-2 every 6-8 hours as needed for pain. Max 400mg  /day 90 tablet 2   No current facility-administered medications for this visit.    Allergies as of 10/01/2015 - Review Complete 10/01/2015  Allergen Reaction Noted  . Penicillin g Shortness Of Breath 10/01/2015  . Penicillins Shortness Of Breath   . Zofran [ondansetron hcl] Shortness Of Breath 10/15/2014  . Azithromycin Swelling 12/29/2011  . Bupropion Other (See Comments) 10/01/2015  . Cimetidine Other (See  Comments)   . Conjugated estrogens Other (See Comments)   . Cyclobenzaprine hcl  03/12/2010  . Dicyclomine hcl Other (See Comments)   . Estrogens, conjugated Other (See Comments) 10/01/2015  . Flagyl [metronidazole] Nausea Only 10/15/2014  . Lisinopril  06/07/2007  . Norco [hydrocodone-acetaminophen] Other (See Comments) 10/12/2014  . Paroxetine Other (See Comments)   . Percocet [oxycodone-acetaminophen]  10/15/2014  . Rocephin [ceftriaxone] Nausea Only 10/15/2014  . Rosuvastatin Dermatitis   . Sulfa antibiotics Hives and Itching 02/15/2011  . Wellbutrin [bupropion hcl] Other (See Comments) 02/15/2011  . Ciprofloxacin Itching and Rash   . Doxycycline Rash 10/01/2015  . Doxycycline hyclate Itching and Rash   . Erythromycin base Itching and Rash   . Erythromycin base Rash 10/01/2015  . Prednisone Rash 10/31/2013    Vitals: BP 131/74 mmHg  Pulse  64  Ht 5\' 6"  (1.676 m)  Wt 244 lb 12.8 oz (111.041 kg)  BMI 39.53 kg/m2  LMP 02/10/2015 Last Weight:  Wt Readings from Last 1 Encounters:  10/01/15 244 lb 12.8 oz (111.041 kg)   Last Height:   Ht Readings from Last 1 Encounters:  10/01/15 5\' 6"  (1.676 m)    Physical exam: Exam: Gen: NAD, conversant, well nourised, obese, well groomed                     CV: RRR, no MRG. No Carotid Bruits. No peripheral edema, warm, nontender Eyes: Conjunctivae clear without exudates or hemorrhage  Neuro: Detailed Neurologic Exam  Speech:    Speech is normal; fluent and spontaneous with normal comprehension.  Cognition:    The patient is oriented to person, place, and time;     recent and remote memory intact;     language fluent;     normal attention, concentration,     fund of knowledge Cranial Nerves:    The pupils are equal, round, and reactive to light. The fundi are normal and spontaneous venous pulsations are present. Visual fields are full to finger confrontation. Extraocular movements are intact. Trigeminal sensation is intact and  the muscles of mastication are normal. Left lower facial droop, incomplete closure of left eye with weakness, weakness of left forehead on eyebrow elevation, Bell's phenomena. Facial sensation normal. The palate elevates in the midline. Hearing intact. Voice is normal. Shoulder shrug is normal. The tongue has normal motion without fasciculations.   Coordination:    Normal finger to nose and heel to shin. Normal rapid alternating movements.   Gait:    Slightly wide based, large body habitus  Motor Observation:    No asymmetry, no atrophy, and no involuntary movements noted. Tone:    Normal muscle tone.    Posture:    Posture is normal. normal erect    Strength:    Strength is V/V in the upper and lower limbs.      Sensation: intact to LT     Reflex Exam:  DTR's:    Deep tendon reflexes in the upper and lower extremities are normal bilaterally.   Toes:    The toes are downgoing bilaterally.   Clonus:    Clonus is absent.      Assessment/Plan:  50 year old female with peripheral cranial nerve VII palsy likely Bell's palsy. She presented to the emergency room and was treated with steroids and Valtrex. Continue this until completion. Discussed Bell's palsy, likely resolution but may take time. Important to protect left eye and cornea including lubricating drops, eye protection such as glasses, and ensuring that the left eye closed all night to avoid drying of the cornea and corneal damage. Discussed normal labs that we check for however is usually idiopathic.  Declines rpr and hiv, we'll check Lyme, sarcoid, Sjogren's Fatigue and weakness: thyroid, cmp, and cbc Discussed MRI brain, will wait and see progress For her pain I will give her tramadol when necessary, Discussed serotonin syndrome as she takes other serotonin drugs, take only as needed, she denies allerfy to tramadol and has taken it before without effect, rxn to oxycodone was not allergy but intolerance  Sarina Ill,  MD  [facility] [address] [place], [place] [address]  Phone [phone] Fax [phone]

## 2015-10-01 NOTE — Patient Instructions (Addendum)
Remember to drink plenty of fluid, eat healthy meals and do not skip any meals. Try to eat protein with a every meal and eat a healthy snack such as fruit or nuts in between meals. Try to keep a regular sleep-wake schedule and try to exercise daily, particularly in the form of walking, 20-30 minutes a day, if you can.   As far as your medications are concerned, I would like to suggest: Tramadol 1-2 every 6-8 hours as needed for pain  As far as diagnostic testing: labs  Our phone number is [phone]. We also have an after hours call service for urgent matters and there is a physician on-call for urgent questions. For any emergencies you know to call 911 or go to the nearest emergency room

## 2015-10-02 LAB — COMPREHENSIVE METABOLIC PANEL
A/G RATIO: 1.5 (ref 1.2–2.2)
ALK PHOS: 72 IU/L (ref 39–117)
ALT: 33 IU/L — AB (ref 0–32)
AST: 23 IU/L (ref 0–40)
Albumin: 4.1 g/dL (ref 3.5–5.5)
BILIRUBIN TOTAL: 0.5 mg/dL (ref 0.0–1.2)
BUN / CREAT RATIO: 23 (ref 9–23)
BUN: 18 mg/dL (ref 6–24)
CO2: 21 mmol/L (ref 18–29)
Calcium: 10.1 mg/dL (ref 8.7–10.2)
Chloride: 100 mmol/L (ref 96–106)
Creatinine, Ser: 0.79 mg/dL (ref 0.57–1.00)
GFR calc Af Amer: 100 mL/min/{1.73_m2} (ref >59)
GFR calc non Af Amer: 87 mL/min/{1.73_m2} (ref >59)
GLOBULIN, TOTAL: 2.8 g/dL (ref 1.5–4.5)
Glucose: 86 mg/dL (ref 65–99)
Potassium: 4.5 mmol/L (ref 3.5–5.2)
Sodium: 140 mmol/L (ref 134–144)
Total Protein: 6.9 g/dL (ref 6.0–8.5)

## 2015-10-02 LAB — CBC
Hematocrit: 42.5 % (ref 34.0–46.6)
Hemoglobin: 14 g/dL (ref 11.1–15.9)
MCH: 27.4 pg (ref 26.6–33.0)
MCHC: 32.9 g/dL (ref 31.5–35.7)
MCV: 83 fL (ref 79–97)
PLATELETS: 284 10*3/uL (ref 150–379)
RBC: 5.11 x10E6/uL (ref 3.77–5.28)
RDW: 16.8 % — AB (ref 12.3–15.4)
WBC: 12.6 10*3/uL — AB (ref 3.4–10.8)

## 2015-10-02 LAB — SJOGREN'S SYNDROME ANTIBODS(SSA + SSB)

## 2015-10-02 LAB — THYROID PANEL WITH TSH
Free Thyroxine Index: 2.3 (ref 1.2–4.9)
T3 Uptake Ratio: 29 % (ref 24–39)
T4 TOTAL: 7.8 ug/dL (ref 4.5–12.0)
TSH: 1.46 u[IU]/mL (ref 0.450–4.500)

## 2015-10-02 LAB — B. BURGDORFI ANTIBODIES: Lyme IgG/IgM Ab: <0.91 {ISR} (ref 0.00–0.90)

## 2015-10-02 LAB — ANGIOTENSIN CONVERTING ENZYME: ANGIO CONVERT ENZYME: 32 U/L (ref 14–82)

## 2015-10-05 ENCOUNTER — Telehealth: Payer: Self-pay | Admitting: *Deleted

## 2015-10-05 NOTE — Telephone Encounter (Signed)
-----   Message from Melvenia Beam, MD sent at 10/03/2015  6:11 PM EDT ----- Labs are normal except for elevated white blood cells which is due to the steroids she is taking(this happens with steroids). One of her liver function tests is very minimally elevated at 33 (should be less than 32) but I am not too concerned about this small elevation, she should have it checked again at next primary care visit.

## 2015-10-05 NOTE — Telephone Encounter (Signed)
I have spoken with Cassandra Mcconnell this morning, and per AA, advised that labs look ok--wbc's are some elevated, but this is due to the steroids she has been taking.  Also, she has one lft that was minimally elevated, but AA is not concerned with this right now.  She should have pcp recheck lft's at next ov.  She verbalized understanding of same/fim

## 2015-10-12 ENCOUNTER — Ambulatory Visit: Payer: Self-pay | Admitting: Family Medicine

## 2015-10-12 ENCOUNTER — Other Ambulatory Visit: Payer: Self-pay | Admitting: Family Medicine

## 2015-10-12 DIAGNOSIS — Z0289 Encounter for other administrative examinations: Secondary | ICD-10-CM

## 2015-10-13 NOTE — Telephone Encounter (Signed)
Pt has not had anxiety f/u since 06/2014

## 2015-10-15 ENCOUNTER — Encounter: Payer: Self-pay | Admitting: Family Medicine

## 2015-10-15 ENCOUNTER — Ambulatory Visit (INDEPENDENT_AMBULATORY_CARE_PROVIDER_SITE_OTHER): Payer: Commercial Managed Care - HMO | Admitting: Family Medicine

## 2015-10-15 VITALS — BP 128/80 | HR 69 | Temp 97.5°F | Wt 248.0 lb

## 2015-10-15 DIAGNOSIS — B351 Tinea unguium: Secondary | ICD-10-CM | POA: Diagnosis not present

## 2015-10-15 DIAGNOSIS — F329 Major depressive disorder, single episode, unspecified: Secondary | ICD-10-CM | POA: Diagnosis not present

## 2015-10-15 DIAGNOSIS — E7800 Pure hypercholesterolemia, unspecified: Secondary | ICD-10-CM | POA: Diagnosis not present

## 2015-10-15 DIAGNOSIS — I10 Essential (primary) hypertension: Secondary | ICD-10-CM

## 2015-10-15 DIAGNOSIS — F32A Depression, unspecified: Secondary | ICD-10-CM

## 2015-10-15 LAB — LIPID PANEL
Cholesterol: 169 mg/dL (ref 0–200)
HDL: 33.1 mg/dL — ABNORMAL LOW (ref >39.00)
NONHDL: 135.57
TRIGLYCERIDES: 236 mg/dL — AB (ref 0.0–149.0)
Total CHOL/HDL Ratio: 5
VLDL: 47.2 mg/dL — ABNORMAL HIGH (ref 0.0–40.0)

## 2015-10-15 LAB — LDL CHOLESTEROL, DIRECT: Direct LDL: 105 mg/dL

## 2015-10-15 MED ORDER — BUSPIRONE HCL 30 MG PO TABS: 30.0000 mg | ORAL_TABLET | Freq: Two times a day (BID) | ORAL | Status: DC

## 2015-10-15 NOTE — Assessment & Plan Note (Addendum)
Deteriorated. Multiple drug allergies and intolerances to SSRIs, including zoloft. Restart buspar. Continue celexa 40 mg daily. Call or return to clinic prn if these symptoms worsen or fail to improve as anticipated. The patient indicates understanding of these issues and agrees with the plan.

## 2015-10-15 NOTE — Progress Notes (Signed)
Pre visit review using our clinic review tool, if applicable. No additional management support is needed unless otherwise documented below in the visit note. 

## 2015-10-15 NOTE — Patient Instructions (Signed)
Good to see you.  We are referring you to a foot doctor (podiatry).  We will call you with your lab results and you can view them online.

## 2015-10-15 NOTE — Progress Notes (Signed)
Subjective:   Patient ID: Cassandra Mcconnell, female    DOB: December 23, 1965, 50 y.o.   MRN: [id number]  Cassandra Mcconnell is a pleasant 50 y.o. year old female who presents to clinic today with Follow-up; Medication Refill; and Toe Pain  on 10/15/2015  HPI:  Left big toe nail fungus- was on oral lamasil months ago after failing topical rx without any improvement.  Nail is still thickened and becoming painful.  No surrounding erythema or swelling.   HTN- well controlled Lopressor 25 mg twice daily. Denies HA, blurred vision, CP or SOB.  Lab Results  Component Value Date   CREATININE 0.79 10/01/2015   HLD- taking Zocor 20 mg daily. Due for labs Lab Results  Component Value Date   CHOL 172 10/15/2014   HDL 31.90* 10/15/2014   LDLCALC 122* 10/15/2014   LDLDIRECT 136.4 10/31/2013   TRIG 90.0 10/15/2014   CHOLHDL 5 10/15/2014   Lab Results  Component Value Date   ALT 33* 10/01/2015   AST 23 10/01/2015   ALKPHOS 72 10/01/2015   BILITOT 0.5 10/01/2015    Anxiety/depression- Symptoms have not been controlled with celexa 40 mg daily. Having more mood swings and anxiety. Not sleeping as well. No SI or HI. Intolerant to several SSRIs, most recently Zoloft.   Current Outpatient Prescriptions on File Prior to Visit  Medication Sig Dispense Refill  . acetaminophen (TYLENOL) 500 MG tablet Take 1,000 mg by mouth every 6 (six) hours as needed. For pain     . citalopram (CELEXA) 20 MG tablet Take 1 tablet (20 mg total) by mouth 2 (two) times daily. 60 tablet 2  . citalopram (CELEXA) 20 MG tablet TAKE ONE TABLET BY MOUTH TWICE DAILY. 60 tablet 0  . docusate sodium (COLACE) 50 MG capsule Take 50 mg by mouth daily as needed for mild constipation.    Marland Kitchen ibuprofen (ADVIL,MOTRIN) 200 MG tablet Take 800 mg by mouth daily as needed. For cramps    . metoprolol tartrate (LOPRESSOR) 25 MG tablet Take 1 tablet (25 mg total) by mouth 2 (two) times daily. 60 tablet 6  . Multiple Vitamin (MULTIVITAMIN)  tablet Take 1 tablet by mouth daily.    Marland Kitchen omeprazole (PRILOSEC) 20 MG capsule Take 20 mg by mouth daily.    . simvastatin (ZOCOR) 20 MG tablet Take 1 tablet (20 mg total) by mouth at bedtime. OFFICE VISIT REQUIRED WITH LABS REQUIRED FOR ADDITIONAL REFILLS 30 tablet 0  . traMADol (ULTRAM) 50 MG tablet Tramadol 1-2 every 6-8 hours as needed for pain. Max 400mg  /day 90 tablet 2  . triamcinolone cream (KENALOG) 0.1 % Apply 1 application topically 2 (two) times daily. 30 g 2   No current facility-administered medications on file prior to visit.    Allergies  Allergen Reactions  . Penicillin G Shortness Of Breath  . Penicillins Shortness Of Breath    Pt tolerated ceftriaxone given in ED 10/12/14  . Zofran [Ondansetron Hcl] Shortness Of Breath    Numbness,  . Azithromycin Swelling  . Bupropion Other (See Comments)    Emotional   . Cimetidine Other (See Comments)    unknown  . Conjugated Estrogens Other (See Comments)    emotional  . Cyclobenzaprine Hcl     REACTION: disorientation  . Dicyclomine Hcl Other (See Comments)    unknown  . Estrogens, Conjugated Other (See Comments)    emotional  . Flagyl [Metronidazole] Nausea Only  . Lisinopril     REACTION: RASH AND MOUTH SORE  .  Norco [Hydrocodone-Acetaminophen] Other (See Comments)    Felt like she was going through withdrawal  . Paroxetine Other (See Comments)    Unknown  . Percocet [Oxycodone-Acetaminophen]     Hypotension   . Rocephin [Ceftriaxone] Nausea Only  . Rosuvastatin Dermatitis  . Sulfa Antibiotics Hives and Itching  . Wellbutrin [Bupropion Hcl] Other (See Comments)    Emotional, makes depression worse  . Ciprofloxacin Itching and Rash  . Doxycycline Rash  . Doxycycline Hyclate Itching and Rash  . Erythromycin Base Itching and Rash  . Erythromycin Base Rash  . Prednisone Rash    Past Medical History  Diagnosis Date  . Anxiety   . GERD (gastroesophageal reflux disease)   . Hyperlipidemia   . Hypertension   .  Wears glasses   . Depression   . Back pain   . Diverticulitis   . Bell's palsy   . Bell's palsy     Past Surgical History  Procedure Laterality Date  . Septoplasty  1988  . Tonsillectomy  1991  . Cholecystectomy      1998  . Back surgery  2004    l4/5 fusion  . Breast reduction surgery Bilateral 08/08/2014    Procedure: BILATERAL BREAST REDUCTION  (BREAST);  Surgeon: Irene Limbo, MD;  Location: [place];  Service: Plastics;  Laterality: Bilateral;  . Laparoscopic bilateral salpingo oopherectomy Bilateral 03/11/2015    Procedure: LAPAROSCOPIC BILATERAL SALPINGO OOPHORECTOMY;  Surgeon: Florian Buff, MD;  Location: [facility];  Service: Gynecology;  Laterality: Bilateral;    Family History  Problem Relation Age of Onset  . COPD Mother   . Hypertension Mother   . GER disease Mother   . Heart attack Father   . Cancer Father     prostate  . Hyperlipidemia Brother   . Hypertension Brother   . Diabetes Brother   . Neuropathy Brother   . Heart attack Paternal Grandmother   . Heart attack Paternal Grandfather   . Stroke Paternal Grandfather   . Hyperlipidemia Brother   . Hypertension Brother     Social History   Social History  . Marital Status: Married    Spouse Name: N/A  . Number of Children: 1  . Years of Education: N/A   Occupational History  . stay at home mom    Social History Main Topics  . Smoking status: Never Smoker   . Smokeless tobacco: Never Used  . Alcohol Use: No  . Drug Use: No  . Sexual Activity: Not on file   Other Topics Concern  . Not on file   Social History Narrative   The PMH, PSH, Social History, Family History, Medications, and allergies have been reviewed in [facility], and have been updated if relevant.   Review of Systems  Constitutional: Negative.   HENT: Negative.   Respiratory: Negative.   Cardiovascular: Negative.   Musculoskeletal: Negative.   Skin: Negative.        +nail discoloration  Neurological:  Negative.   Hematological: Negative.   Psychiatric/Behavioral: Positive for sleep disturbance, dysphoric mood and agitation. Negative for suicidal ideas and self-injury. The patient is nervous/anxious.   All other systems reviewed and are negative.      Objective:    BP 128/80 mmHg  Pulse 69  Temp(Src) 97.5 F (36.4 C) (Oral)  Wt 248 lb (112.492 kg)  SpO2 98%  LMP 02/10/2015   Physical Exam  Constitutional: She is oriented to person, place, and time. She appears well-developed and well-nourished. No  distress.  HENT:  Head: Normocephalic.  Eyes: Conjunctivae are normal.  Cardiovascular: Normal rate and regular rhythm.   Pulmonary/Chest: Effort normal and breath sounds normal.  Musculoskeletal: She exhibits no edema.  Neurological: She is alert and oriented to person, place, and time. No cranial nerve deficit.  Skin: Skin is warm and dry. She is not diaphoretic. No erythema.  Left great toe nail thickened and discolored  Psychiatric: She has a normal mood and affect. Her behavior is normal. Judgment and thought content normal.  Nursing note and vitals reviewed.         Assessment & Plan:   HYPERCHOLESTEROLEMIA, PURE - Plan: Lipid panel  Depression  HYPERTENSION, BENIGN ESSENTIAL  Onychomycosis - Plan: Ambulatory referral to Podiatry No Follow-up on file.

## 2015-10-15 NOTE — Assessment & Plan Note (Signed)
Continue current dose of statin. Check labs today. 

## 2015-10-15 NOTE — Assessment & Plan Note (Signed)
Well controlled on current rx. NO changes made today.

## 2015-10-15 NOTE — Assessment & Plan Note (Signed)
Has failed topical and oral lamisil. Refer to podiatry.

## 2015-11-04 ENCOUNTER — Ambulatory Visit: Payer: Commercial Managed Care - HMO | Admitting: Podiatry

## 2015-11-04 ENCOUNTER — Telehealth: Payer: Self-pay | Admitting: Neurology

## 2015-11-04 NOTE — Telephone Encounter (Signed)
Patient is calling because she is still having problems with vision in her left eye since her last visit on 10-01-15.and her face is still paralyzed. The patient states she needs to be seen or an MRI needs to be scheduled. Please call and discuss.

## 2015-11-04 NOTE — Telephone Encounter (Signed)
This patient will be seen tomorrow morning in revisit. The onset of the Bell's palsy was in the beginning of June, it is quite a bit early to expect full recovery at this time.

## 2015-11-04 NOTE — Telephone Encounter (Signed)
Pt called back sts she has not seen any improvement in paralysis, speech difficulty.

## 2015-11-04 NOTE — Telephone Encounter (Signed)
Returned pt TC and appt scheduled for tomorrow morning w/ Dr. Jannifer Franklin d/t no available work-in appts this afternoon.

## 2015-11-05 ENCOUNTER — Encounter: Payer: Self-pay | Admitting: Neurology

## 2015-11-05 ENCOUNTER — Ambulatory Visit (INDEPENDENT_AMBULATORY_CARE_PROVIDER_SITE_OTHER): Payer: Commercial Managed Care - HMO | Admitting: Neurology

## 2015-11-05 VITALS — BP 120/64 | HR 72 | Resp 20 | Ht 66.0 in | Wt 251.0 lb

## 2015-11-05 DIAGNOSIS — G510 Bell's palsy: Secondary | ICD-10-CM

## 2015-11-05 NOTE — Patient Instructions (Signed)
Bell Palsy °Bell palsy is a condition in which the muscles on one side of the face become paralyzed. This often causes one side of the face to droop. It is a common condition and most people recover completely. °RISK FACTORS °Risk factors for Bell palsy include: °· Pregnancy. °· Diabetes. °· An infection by a virus, such as infections that cause cold sores. °CAUSES  °Bell palsy is caused by damage to or inflammation of a nerve in your face. It is unclear why this happens, but an infection by a virus may lead to it. Most of the time the reason it happens is unknown. °SIGNS AND SYMPTOMS  °Symptoms can range from mild to severe and can take place over a number of hours. Symptoms may include: °· Being unable to: °¨ Raise one or both eyebrows. °¨ Close one or both eyes. °¨ Feel parts of your face (facial numbness). °· Drooping of the eyelid and corner of the mouth. °· Weakness in the face. °· Paralysis of half your face. °· Loss of taste. °· Sensitivity to loud noises. °· Difficulty chewing. °· Tearing up of the affected eye. °· Dryness in the affected eye. °· Drooling. °· Pain behind one ear. °DIAGNOSIS  °Diagnosis of Bell palsy may include: °· A medical history and physical exam. °· An MRI. °· A CT scan. °· Electromyography (EMG). This is a test that checks how your nerves are working. °TREATMENT  °Treatment may include antiviral medicine to help shorten the length of the condition. Sometimes treatment is not needed and the symptoms go away on their own. °HOME CARE INSTRUCTIONS  °· Take medicines only as directed by your health care provider. °· Do facial massages and exercises as directed by your health care provider. °· If your eye is affected: °¨ Use moisturizing eye drops to prevent drying of your eye as directed by your health care provider. °¨ Protect your eye as directed by your health care provider. °SEEK MEDICAL CARE IF: °· Your symptoms do not get better or get worse. °· You are drooling. °· Your eye is red,  irritated, or hurts. °SEEK IMMEDIATE MEDICAL CARE IF:  °· Another part of your body feels weak or numb. °· You have difficulty swallowing. °· You have a fever along with symptoms of Bell palsy. °· You develop neck pain. °MAKE SURE YOU:  °· Understand these instructions. °· Will watch your condition. °· Will get help right away if you are not doing well or get worse. °  °This information is not intended to replace advice given to you by your health care provider. Make sure you discuss any questions you have with your health care provider. °  °Document Released: 04/11/2005 Document Revised: 12/31/2014 Document Reviewed: 07/19/2013 °Elsevier Interactive Patient Education ©2016 [employer] ° °

## 2015-11-05 NOTE — Progress Notes (Signed)
Reason for visit: Left Bell's palsy  Cassandra Mcconnell is an 50 y.o. female  History of present illness:  Cassandra Mcconnell is a 50 year old left-handed white female with a history of prior left-sided Bell's palsy in 2009. The patient had a good recovery from this which occurred within only a few weeks of onset of the weakness. The patient has had recurrence of the same event that began in late May 2017. The patient started with a three-day history of left occipital headache that then progressed to severe pain involving the left ear with onset of left-sided facial weakness on 09/22/2015. The patient has had some hyperacusis with the left ear, she denies any change in taste. She has had some blurred vision and light sensitivity with the left eye. She denies trouble swallowing or chewing. She is concerned that she has not gotten better from her Bell's palsy at this point. The patient denies any numbness or weakness of the arms or legs, she denies any balance issues or difficulty controlling the bowels or the bladder. She reports some monocular double vision in the left eye. When the left eye is covered, there is no double vision with the right eye. She is using a methylcellulose eyedrop. She reports a history of dryness of the eyes prior to the onset of the Bell's palsy, she has had some issues with tearing of the eye on the left after onset of the Bell's palsy. She returns for an evaluation.  Past Medical History  Diagnosis Date  . Anxiety   . GERD (gastroesophageal reflux disease)   . Hyperlipidemia   . Hypertension   . Wears glasses   . Depression   . Back pain   . Diverticulitis   . Bell's palsy     Left side, 2009, 2017    Past Surgical History  Procedure Laterality Date  . Septoplasty  1988  . Tonsillectomy  1991  . Cholecystectomy      1998  . Back surgery  2004    l4/5 fusion  . Breast reduction surgery Bilateral 08/08/2014    Procedure: BILATERAL BREAST REDUCTION  (BREAST);   Surgeon: Irene Limbo, MD;  Location: [place];  Service: Plastics;  Laterality: Bilateral;  . Laparoscopic bilateral salpingo oopherectomy Bilateral 03/11/2015    Procedure: LAPAROSCOPIC BILATERAL SALPINGO OOPHORECTOMY;  Surgeon: Florian Buff, MD;  Location: [facility];  Service: Gynecology;  Laterality: Bilateral;    Family History  Problem Relation Age of Onset  . COPD Mother   . Hypertension Mother   . GER disease Mother   . Heart attack Father   . Cancer Father     prostate  . Hyperlipidemia Brother   . Hypertension Brother   . Diabetes Brother   . Neuropathy Brother   . Heart attack Paternal Grandmother   . Heart attack Paternal Grandfather   . Stroke Paternal Grandfather   . Hyperlipidemia Brother   . Hypertension Brother     Social history:  reports that she has never smoked. She has never used smokeless tobacco. She reports that she does not drink alcohol or use illicit drugs.    Allergies  Allergen Reactions  . Penicillin G Shortness Of Breath  . Penicillins Shortness Of Breath    Pt tolerated ceftriaxone given in ED 10/12/14  . Zofran [Ondansetron Hcl] Shortness Of Breath    Numbness,  . Azithromycin Swelling  . Bupropion Other (See Comments)    Emotional   . Cimetidine Other (See Comments)  unknown  . Conjugated Estrogens Other (See Comments)    emotional  . Cyclobenzaprine Hcl     REACTION: disorientation  . Dicyclomine Hcl Other (See Comments)    unknown  . Estrogens, Conjugated Other (See Comments)    emotional  . Flagyl [Metronidazole] Nausea Only  . Lisinopril     REACTION: RASH AND MOUTH SORE  . Norco [Hydrocodone-Acetaminophen] Other (See Comments)    Felt like she was going through withdrawal  . Paroxetine Other (See Comments)    Unknown  . Percocet [Oxycodone-Acetaminophen]     Hypotension   . Rocephin [Ceftriaxone] Nausea Only  . Rosuvastatin Dermatitis  . Sulfa Antibiotics Hives and Itching  . Wellbutrin [Bupropion  Hcl] Other (See Comments)    Emotional, makes depression worse  . Ciprofloxacin Itching and Rash  . Doxycycline Rash  . Doxycycline Hyclate Itching and Rash  . Erythromycin Base Itching and Rash  . Erythromycin Base Rash  . Prednisone Rash    Medications:  Prior to Admission medications   Medication Sig Start Date End Date Taking? Authorizing Provider  acetaminophen (TYLENOL) 500 MG tablet Take 1,000 mg by mouth every 6 (six) hours as needed. For pain    Yes Historical Provider, MD  citalopram (CELEXA) 20 MG tablet Take 1 tablet (20 mg total) by mouth 2 (two) times daily. 07/16/15  Yes Lucille Passy, MD  docusate sodium (COLACE) 50 MG capsule Take 50 mg by mouth daily as needed for mild constipation.   Yes Historical Provider, MD  ibuprofen (ADVIL,MOTRIN) 200 MG tablet Take 800 mg by mouth daily as needed. For cramps   Yes Historical Provider, MD  metoprolol tartrate (LOPRESSOR) 25 MG tablet Take 1 tablet (25 mg total) by mouth 2 (two) times daily. 05/07/15  Yes Lucille Passy, MD  Multiple Vitamin (MULTIVITAMIN) tablet Take 1 tablet by mouth daily.   Yes Historical Provider, MD  omeprazole (PRILOSEC) 20 MG capsule Take 20 mg by mouth daily.   Yes Historical Provider, MD  simvastatin (ZOCOR) 20 MG tablet Take 1 tablet (20 mg total) by mouth at bedtime. OFFICE VISIT REQUIRED WITH LABS REQUIRED FOR ADDITIONAL REFILLS 09/25/15  Yes Lucille Passy, MD  triamcinolone cream (KENALOG) 0.1 % Apply 1 application topically 2 (two) times daily. 10/15/14  Yes Lucille Passy, MD  traMADol (ULTRAM) 50 MG tablet Tramadol 1-2 every 6-8 hours as needed for pain. Max 400mg  /day Patient not taking: Reported on 11/05/2015 10/01/15   Melvenia Beam, MD    ROS:  Out of a complete 14 system review of symptoms, the patient complains only of the following symptoms, and all other reviewed systems are negative.  Facial swelling, hearing loss Eye discharge, light sensitivity, double vision, eye pain, blurred vision Dizziness,  speech difficulty, weakness, facial drooping  Blood pressure 120/64, pulse 72, resp. rate 20, height 5\' 6"  (1.676 m), weight 251 lb (113.853 kg), last menstrual period 02/10/2015.  Physical Exam  General: The patient is alert and cooperative at the time of the examination. The patient is markedly obese.  Ear: The tympanic membranes are clear bilaterally, there is some erythema bilaterally in the ear canals.  Skin: No significant peripheral edema is noted.   Neurologic Exam  Mental status: The patient is alert and oriented x 3 at the time of the examination. The patient has apparent normal recent and remote memory, with an apparently normal attention span and concentration ability.   Cranial nerves: Facial symmetry is not present. The patient has prominent  peripheral facial weakness on the left, she is able to or sleep study left eye voluntarily, she is able to raise the left eyebrow slightly, she has minimal strength in the lower face on the left. The patient has good strength with the tongue, jaw opening and closure, head turning and shoulder shrug. The patient reports monocular double vision with the left eye. Speech is normal, no aphasia or dysarthria is noted. Extraocular movements are full. Visual fields are full. Pupils are equal, round, and reactive to light. Discs are flat bilaterally.  Motor: The patient has good strength in all 4 extremities.  Sensory examination: Soft touch sensation is symmetric on the face, arms, and legs.  Coordination: The patient has good finger-nose-finger and heel-to-shin bilaterally.  Gait and station: The patient has a normal gait. Tandem gait is normal. Romberg is negative. No drift is seen.  Reflexes: Deep tendon reflexes are symmetric.   Assessment/Plan:  1. Left Bell's palsy, recurrent  The patient is only 6 weeks out from onset of the left Bell's palsy, I indicated to her that it may be too early to indicate whether the prognosis is poor or  not, full recovery may take 3-5 months. The patient is already gaining the ability to raise up the left eyebrow, she can forcibly close the left eye, I believe that the recovery process has started. The fact that she has had a prior left Bell's palsy reduces the chances of rapid recovery and a full recovery. She may be at higher risk for aberrant regeneration of the left facial nerve. She is to use the methylcellulose artificial tears solution frequently during the day, and forcibly close the eye frequently to keep the cornea moist. She will follow-up through this office if needed, she is to contact me if she does not believe that she is recovering within the next 6-8 weeks.  Jill Alexanders MD 11/05/2015 8:58 PM  [facility] [address] [place], [place] [address]  Phone [phone] Fax [phone]

## 2015-11-09 ENCOUNTER — Ambulatory Visit: Payer: Self-pay | Admitting: Family Medicine

## 2015-11-11 ENCOUNTER — Encounter: Payer: Self-pay | Admitting: Family Medicine

## 2015-11-12 ENCOUNTER — Other Ambulatory Visit: Payer: Self-pay | Admitting: Family Medicine

## 2015-11-13 ENCOUNTER — Other Ambulatory Visit: Payer: Self-pay | Admitting: Family Medicine

## 2015-12-09 ENCOUNTER — Encounter: Payer: Self-pay | Admitting: Family Medicine

## 2015-12-09 ENCOUNTER — Other Ambulatory Visit: Payer: Self-pay | Admitting: Family Medicine

## 2015-12-09 MED ORDER — TRIAMCINOLONE ACETONIDE 0.1 % EX CREA: TOPICAL_CREAM | CUTANEOUS | 0 refills | Status: DC

## 2015-12-09 NOTE — Telephone Encounter (Signed)
Pt left vm requesting status of refills requested in earlier. Pt request cb.

## 2015-12-15 ENCOUNTER — Other Ambulatory Visit: Payer: Self-pay

## 2015-12-15 MED ORDER — METOPROLOL TARTRATE 25 MG PO TABS: 25.0000 mg | ORAL_TABLET | Freq: Two times a day (BID) | ORAL | 3 refills | Status: DC

## 2015-12-15 MED ORDER — SIMVASTATIN 20 MG PO TABS: 20.0000 mg | ORAL_TABLET | Freq: Every day | ORAL | 3 refills | Status: DC

## 2015-12-15 MED ORDER — CITALOPRAM HYDROBROMIDE 20 MG PO TABS: 20.0000 mg | ORAL_TABLET | Freq: Two times a day (BID) | ORAL | 1 refills | Status: DC

## 2015-12-15 NOTE — Telephone Encounter (Signed)
Pt request refill citalopram, lopressor and simvastatin to [facility] order pharmacy; unable to refill due to refill warnings when try to sign rxs. Please advise.

## 2015-12-15 NOTE — Telephone Encounter (Signed)
Pt said she also needs referral resubmitted [place] [facility]; Bariatric gastric sleeve. Pt request cb.

## 2015-12-15 NOTE — Telephone Encounter (Signed)
Pt left v/m requesting status of letter of medical necessity pt sent email about. Pt request cb. [phone].

## 2015-12-17 NOTE — Telephone Encounter (Signed)
Pt called, She is concerned that she has an appointment with [facility], Dr. Lucia Gaskins on 12/22/15 and she would like to request the letter of medical necessity.  Pt is requesting a callback at [phone]

## 2015-12-21 ENCOUNTER — Telehealth: Payer: Self-pay | Admitting: Family Medicine

## 2015-12-21 NOTE — Telephone Encounter (Signed)
Opened in error

## 2015-12-21 NOTE — Telephone Encounter (Signed)
Patient called back about her letter of medical necessity.  Her appointment is tomorrow and she's very concerned.  Please call her back at [phone]. Patient said if she doesn't hear anything by 3:30 she said she'll call back and file a complaint.

## 2015-12-22 ENCOUNTER — Encounter: Payer: Self-pay | Admitting: Family Medicine

## 2015-12-24 DIAGNOSIS — Z6841 Body Mass Index (BMI) 40.0 and over, adult: Secondary | ICD-10-CM | POA: Diagnosis not present

## 2015-12-24 DIAGNOSIS — G510 Bell's palsy: Secondary | ICD-10-CM | POA: Diagnosis not present

## 2015-12-25 ENCOUNTER — Telehealth: Payer: Self-pay | Admitting: Family Medicine

## 2015-12-25 NOTE — Telephone Encounter (Signed)
Called wanting to know if you could wave charges for 10/12/15 no show. She made appointment with neuro that date and she stated she called to cancel Please advise

## 2015-12-30 ENCOUNTER — Ambulatory Visit (INDEPENDENT_AMBULATORY_CARE_PROVIDER_SITE_OTHER): Payer: Commercial Managed Care - HMO | Admitting: Family Medicine

## 2015-12-30 ENCOUNTER — Encounter: Payer: Self-pay | Admitting: Family Medicine

## 2015-12-30 ENCOUNTER — Ambulatory Visit: Payer: Self-pay

## 2015-12-30 VITALS — BP 120/80 | HR 72 | Wt 244.0 lb

## 2015-12-30 DIAGNOSIS — Z23 Encounter for immunization: Secondary | ICD-10-CM | POA: Diagnosis not present

## 2015-12-30 DIAGNOSIS — M5441 Lumbago with sciatica, right side: Secondary | ICD-10-CM | POA: Diagnosis not present

## 2015-12-30 MED ORDER — MELOXICAM 15 MG PO TABS
15.0000 mg | ORAL_TABLET | Freq: Every day | ORAL | 0 refills | Status: DC | PRN
Start: 2015-12-30 — End: 2016-02-03

## 2015-12-30 MED ORDER — DIAZEPAM 5 MG PO TABS: 5.0000 mg | ORAL_TABLET | Freq: Every evening | ORAL | 0 refills | Status: DC | PRN

## 2015-12-30 NOTE — Patient Instructions (Signed)

## 2015-12-30 NOTE — Progress Notes (Signed)
Subjective:    Patient ID: Cassandra Mcconnell, female    DOB: 1965/09/17, 50 y.o.   MRN: [id number]  HPI This is a 50 yo female who presents today with back pain for 3 days. Pain is in the middle of her low back and intermittently shoots down to her right hip. Pain is where she has had surgery before. This is the worse pain she has had since her surgery in 2004. No numbness or tingling in legs. Has a couple doses of tylenol yesterday without relief. Pain is constant. No weakness of arms/legs, no bowel or bladder incontinence, no falls. Not sure if she overdid some housework prior to pain starting. Has history of chronic back pain. Had PT many years ago following surgery. Does not currently exercise or do stretching exercises. Can not tolerate most muscle relaxants but states that she has used valium in the past. She requests only a small amount of anything prescribed, stating that she hates to take medicine and doesn't want to have to discard what is left.  Past Medical History:  Diagnosis Date  . Anxiety   . Back pain   . Bell's palsy    Left side, 2009, 2017  . Depression   . Diverticulitis   . GERD (gastroesophageal reflux disease)   . Hyperlipidemia   . Hypertension   . Wears glasses    Past Surgical History:  Procedure Laterality Date  . BACK SURGERY  2004   l4/5 fusion  . BREAST REDUCTION SURGERY Bilateral 08/08/2014   Procedure: BILATERAL BREAST REDUCTION  (BREAST);  Surgeon: Irene Limbo, MD;  Location: [place];  Service: Plastics;  Laterality: Bilateral;  . CHOLECYSTECTOMY     1998  . LAPAROSCOPIC BILATERAL SALPINGO OOPHERECTOMY Bilateral 03/11/2015   Procedure: LAPAROSCOPIC BILATERAL SALPINGO OOPHORECTOMY;  Surgeon: Florian Buff, MD;  Location: [facility];  Service: Gynecology;  Laterality: Bilateral;  . SEPTOPLASTY  1988  . tonsillectomy  1991   Family History  Problem Relation Age of Onset  . COPD Mother   . Hypertension Mother   . GER disease Mother     . Heart attack Father   . Cancer Father     prostate  . Hyperlipidemia Brother   . Hypertension Brother   . Diabetes Brother   . Neuropathy Brother   . Heart attack Paternal Grandmother   . Heart attack Paternal Grandfather   . Stroke Paternal Grandfather   . Hyperlipidemia Brother   . Hypertension Brother    Social History  Substance Use Topics  . Smoking status: Never Smoker  . Smokeless tobacco: Never Used  . Alcohol use No      Review of Systems Per HPI    Objective:   Physical Exam  Constitutional: She is oriented to person, place, and time. She appears well-developed and well-nourished. No distress.  obese  HENT:  Head: Normocephalic and atraumatic.  Eyes: Conjunctivae are normal.  Cardiovascular: Normal rate, regular rhythm and normal heart sounds.   Pulmonary/Chest: Effort normal and breath sounds normal.  Musculoskeletal:       Cervical back: Normal.       Thoracic back: Normal.       Lumbar back: She exhibits decreased range of motion (unable to perform any twisting movements due to implanted rods) and tenderness (lower middle and right paraspinal). She exhibits no bony tenderness, no swelling and no edema.  Neurological: She is alert and oriented to person, place, and time. She has normal reflexes. No cranial  nerve deficit. Coordination normal.  Skin: Skin is warm and dry. She is not diaphoretic.  Psychiatric: She has a normal mood and affect. Her behavior is normal. Judgment and thought content normal.  Vitals reviewed.     BP 120/80   Pulse 72   Wt 244 lb (110.7 kg)   LMP 02/10/2015   SpO2 98%   BMI 39.38 kg/m  Wt Readings from Last 3 Encounters:  12/30/15 244 lb (110.7 kg)  11/05/15 251 lb (113.9 kg)  10/15/15 248 lb (112.5 kg)       Assessment & Plan:  1. Low back pain with right-sided sciatica, unspecified back pain laterality - discussed chronic nature of back pain and encouraged increased exercise- walking, stretching and strength.  Provided written information - meloxicam (MOBIC) 15 MG tablet; Take 1 tablet (15 mg total) by mouth daily as needed for pain.  Dispense: 20 tablet; Refill: 0 - diazepam (VALIUM) 5 MG tablet; Take 1 tablet (5 mg total) by mouth at bedtime as needed for anxiety.  Dispense: 10 tablet; Refill: 0 - RTC precautions reviewed, if no improvement in 2 weeks, consider PT referral or back to Dr. Saintclair Halsted who did her surgery  2. Need for influenza vaccination - Flu Vaccine QUAD 36+ mos PF IM (Fluarix & Fluzone Quad PF)   Clarene Reamer, FNP-BC  [facility]  01/02/2016 11:40 AM

## 2015-12-30 NOTE — Progress Notes (Signed)
Pre visit review using our clinic review tool, if applicable. No additional management support is needed unless otherwise documented below in the visit note. 

## 2016-01-01 ENCOUNTER — Other Ambulatory Visit (HOSPITAL_COMMUNITY): Payer: Self-pay | Admitting: Surgery

## 2016-01-04 ENCOUNTER — Ambulatory Visit (HOSPITAL_COMMUNITY)
Admission: RE | Admit: 2016-01-04 | Discharge: 2016-01-04 | Disposition: A | Discharge status: Home / Self Care | Payer: Commercial Managed Care - HMO | Source: Ambulatory Visit | Attending: Surgery | Admitting: Surgery

## 2016-01-04 DIAGNOSIS — K449 Diaphragmatic hernia without obstruction or gangrene: Secondary | ICD-10-CM | POA: Insufficient documentation

## 2016-01-04 DIAGNOSIS — Z01818 Encounter for other preprocedural examination: Secondary | ICD-10-CM | POA: Diagnosis not present

## 2016-01-04 DIAGNOSIS — K224 Dyskinesia of esophagus: Secondary | ICD-10-CM | POA: Insufficient documentation

## 2016-01-04 DIAGNOSIS — Z6841 Body Mass Index (BMI) 40.0 and over, adult: Secondary | ICD-10-CM | POA: Insufficient documentation

## 2016-01-05 ENCOUNTER — Ambulatory Visit: Payer: Commercial Managed Care - HMO | Admitting: Obstetrics & Gynecology

## 2016-01-08 ENCOUNTER — Encounter: Payer: Self-pay | Admitting: Dietician

## 2016-01-08 ENCOUNTER — Encounter: Payer: Commercial Managed Care - HMO | Attending: Surgery | Admitting: Dietician

## 2016-01-08 DIAGNOSIS — Z713 Dietary counseling and surveillance: Secondary | ICD-10-CM | POA: Diagnosis not present

## 2016-01-08 NOTE — Progress Notes (Signed)
  Pre-Op Assessment Visit:  Pre-Operative Sleeve gastrectomy Surgery  Medical Nutrition Therapy:  Appt start time: 0930   End time:  1010.  Patient was seen on 01/08/2016 for Pre-Operative Nutrition Assessment. Assessment and letter of approval faxed to [facility] Bariatric Surgery Program coordinator on 01/08/2016.   Preferred Learning Style:   No preference indicated   Learning Readiness:   Ready  Handouts given during visit include:  Pre-Op Goals Bariatric Surgery Protein Shakes Pre op diet   During the appointment today the following Pre-Op Goals were reviewed with the patient: Maintain or lose weight as instructed by your surgeon Make healthy food choices Begin to limit portion sizes Limited concentrated sugars and fried foods Keep fat/sugar in the single digits per serving on   food labels Practice CHEWING your food  (aim for 30 chews per bite or until applesauce consistency) Practice not drinking 15 minutes before, during, and 30 minutes after each meal/snack Avoid all carbonated beverages  Avoid/limit caffeinated beverages  Avoid all sugar-sweetened beverages Consume 3 meals per day; eat every 3-5 hours Make a list of non-food related activities Aim for 64-100 ounces of FLUID daily  Aim for at least 60-80 grams of PROTEIN daily Look for a liquid protein source that contain ?15 g protein and ?5 g carbohydrate  (ex: shakes, drinks, shots)  Demonstrated degree of understanding via:  Teach Back  Teaching Method Utilized:  Visual Auditory Hands on  Barriers to learning/adherence to lifestyle change: family stress  Patient to call the [facility] to enroll in Pre-Op and Post-Op Nutrition Education when surgery date is scheduled.

## 2016-01-18 ENCOUNTER — Encounter: Payer: Commercial Managed Care - HMO | Admitting: Dietician

## 2016-01-18 DIAGNOSIS — Z713 Dietary counseling and surveillance: Secondary | ICD-10-CM | POA: Diagnosis not present

## 2016-01-19 ENCOUNTER — Encounter: Payer: Self-pay | Admitting: Dietician

## 2016-01-19 NOTE — Progress Notes (Signed)
  Pre-Operative Nutrition Class:  Appt start time: 4098   End time:  1830.  Patient was seen on 01/18/2016 for Pre-Operative Bariatric Surgery Education at the [facility].   Surgery date:  Surgery type: Sleeve gastrectomy Start weight at [facility]: 246 lbs on 01/08/2016 Weight today:   TANITA  BODY COMP RESULTS  01/18/16   BMI (kg/m^2) N/A   Fat Mass (lbs)    Fat Free Mass (lbs)    Total Body Water (lbs)    Samples given per MNT protocol. Patient educated on appropriate usage: Bariatric Advantage Multivitamin (mixed fruit - qty 1) Lot #: [id number] Exp: 10/2016  Premier protein shake (chocolate - qty 1) Lot #: [id number] Exp: 10/2016  Bariatric Advantage Vitamins Calcium Citrate chew (strawberry - qty 1) Lot #: [id number] Exp: 08/2016  Renee Pain Protein Powder (unflavored - qty 1) Lot #: [id number] Exp: 04/2017  The following the learning objectives were met by the patient during this course:  Identify Pre-Op Dietary Goals and will begin 2 weeks pre-operatively  Identify appropriate sources of fluids and proteins   State protein recommendations and appropriate sources pre and post-operatively  Identify Post-Operative Dietary Goals and will follow for 2 weeks post-operatively  Identify appropriate multivitamin and calcium sources  Describe the need for physical activity post-operatively and will follow MD recommendations  State when to call healthcare provider regarding medication questions or post-operative complications  Handouts given during class include:  Pre-Op Bariatric Surgery Diet Handout  Protein Shake Handout  Post-Op Bariatric Surgery Nutrition Handout  BELT Program Information Flyer  Support Group Information Flyer  [facility] Bariatric Supplements Price List  Follow-Up Plan: Patient will follow-up at [facility] 2 weeks post operatively for diet advancement per MD.

## 2016-01-27 ENCOUNTER — Encounter: Payer: Self-pay | Admitting: Family Medicine

## 2016-01-29 ENCOUNTER — Encounter (HOSPITAL_COMMUNITY): Payer: Self-pay | Admitting: Emergency Medicine

## 2016-01-29 ENCOUNTER — Ambulatory Visit (HOSPITAL_COMMUNITY): Payer: Self-pay | Admitting: Psychology

## 2016-01-29 ENCOUNTER — Emergency Department (HOSPITAL_COMMUNITY)
Admission: EM | Admit: 2016-01-29 | Discharge: 2016-01-29 | Disposition: A | Discharge status: Home / Self Care | Payer: Commercial Managed Care - HMO | Attending: Emergency Medicine | Admitting: Emergency Medicine

## 2016-01-29 ENCOUNTER — Emergency Department (HOSPITAL_COMMUNITY): Payer: Commercial Managed Care - HMO

## 2016-01-29 DIAGNOSIS — M25561 Pain in right knee: Secondary | ICD-10-CM | POA: Diagnosis not present

## 2016-01-29 DIAGNOSIS — Z79899 Other long term (current) drug therapy: Secondary | ICD-10-CM | POA: Diagnosis not present

## 2016-01-29 DIAGNOSIS — Z791 Long term (current) use of non-steroidal anti-inflammatories (NSAID): Secondary | ICD-10-CM | POA: Insufficient documentation

## 2016-01-29 DIAGNOSIS — I10 Essential (primary) hypertension: Secondary | ICD-10-CM | POA: Diagnosis not present

## 2016-01-29 DIAGNOSIS — M25562 Pain in left knee: Secondary | ICD-10-CM | POA: Diagnosis not present

## 2016-01-29 NOTE — Discharge Instructions (Signed)
Take your ibuprofen, apply ice and follow up with the orthopedic doctor. Return here as needed.

## 2016-01-29 NOTE — ED Triage Notes (Signed)
Onset 2 days ago left knee pain, denies injury, heard it pop.

## 2016-01-29 NOTE — ED Provider Notes (Signed)
[id number] [facility] Provider Note   CSN: [id number] Arrival date & time: 01/29/16  1648     History   Chief Complaint Chief Complaint  Patient presents with  . Knee Pain    HPI Cassandra Mcconnell is a 50 y.o. female who presents to the ED with right knee pain. Patient reports that she was sitting and when she got up she felt a pop in her knee followed by pain and swelling. She elevated the area last night and it helped reduced the swelling but she continues to have pain.   Patient ambulating with a cane. She reports she does not usually use a cane unless she has a flair of her back pain.   The history is provided by the patient. No language interpreter was used.  Knee Pain   This is a new problem. The current episode started yesterday. The problem occurs constantly. The problem has not changed since onset.The pain is present in the left knee. The quality of the pain is described as sharp and constant. The pain is moderate. Pertinent negatives include no numbness. She has tried nothing for the symptoms. There has been no history of extremity trauma.    Past Medical History:  Diagnosis Date  . Anxiety   . Back pain   . Bell's palsy    Left side, 2009, 2017  . Depression   . Diverticulitis   . GERD (gastroesophageal reflux disease)   . Hyperlipidemia   . Hypertension   . Wears glasses     Patient Active Problem List   Diagnosis Date Noted  . Onychomycosis 10/15/2015  . Bell's palsy 09/29/2015  . Cutaneous skin tags 02/18/2015  . Complex ovarian cyst 10/15/2014  . Diverticulitis 10/12/2014  . Severe obesity (BMI >= 40) ([place]) 05/26/2014  . Depression 05/31/2013  . Morbid obesity ([place]) 12/04/2009  . 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 HYPERTROPHY, BREAST 11/20/2006  . Anxiety state 11/10/2006  . GERD 11/10/2006  . HYPERCHOLESTEROLEMIA, PURE 11/09/2006  . HYPERTENSION, BENIGN ESSENTIAL 11/09/2006  . CHOLECYSTECTOMY, HX OF 11/09/2006    Past  Surgical History:  Procedure Laterality Date  . BACK SURGERY  2004   l4/5 fusion  . BREAST REDUCTION SURGERY Bilateral 08/08/2014   Procedure: BILATERAL BREAST REDUCTION  (BREAST);  Surgeon: Irene Limbo, MD;  Location: [facility];  Service: Plastics;  Laterality: Bilateral;  . CHOLECYSTECTOMY     1998  . LAPAROSCOPIC BILATERAL SALPINGO OOPHERECTOMY Bilateral 03/11/2015   Procedure: LAPAROSCOPIC BILATERAL SALPINGO OOPHORECTOMY;  Surgeon: Florian Buff, MD;  Location: [facility];  Service: Gynecology;  Laterality: Bilateral;  . SEPTOPLASTY  1988  . tonsillectomy  1991    OB History    No data available       Home Medications    Prior to Admission medications   Medication Sig Start Date End Date Taking? Authorizing Provider  acetaminophen (TYLENOL) 500 MG tablet Take 1,000 mg by mouth every 6 (six) hours as needed. For pain     Historical Provider, MD  citalopram (CELEXA) 20 MG tablet Take 1 tablet (20 mg total) by mouth 2 (two) times daily. 12/15/15   Lucille Passy, MD  diazepam (VALIUM) 5 MG tablet Take 1 tablet (5 mg total) by mouth at bedtime as needed for anxiety. 12/30/15   Elby Beck, FNP  docusate sodium (COLACE) 50 MG capsule Take 50 mg by mouth daily as needed for mild constipation.    Historical Provider, MD  ibuprofen (ADVIL,MOTRIN) 200  MG tablet Take 800 mg by mouth daily as needed. For cramps    Historical Provider, MD  meloxicam (MOBIC) 15 MG tablet Take 1 tablet (15 mg total) by mouth daily as needed for pain. 12/30/15   Elby Beck, FNP  metoprolol tartrate (LOPRESSOR) 25 MG tablet Take 1 tablet (25 mg total) by mouth 2 (two) times daily. 12/15/15   Lucille Passy, MD  Multiple Vitamin (MULTIVITAMIN) tablet Take 1 tablet by mouth daily.    Historical Provider, MD  omeprazole (PRILOSEC) 20 MG capsule Take 20 mg by mouth daily.    Historical Provider, MD  simvastatin (ZOCOR) 20 MG tablet Take 1 tablet (20 mg total) by mouth at bedtime. 12/15/15   Lucille Passy, MD  triamcinolone cream (KENALOG) 0.1 % APPLY TO AFFECTED AREA TWICE DAILY. 12/09/15   Lucille Passy, MD    Family History Family History  Problem Relation Age of Onset  . COPD Mother   . Hypertension Mother   . GER disease Mother   . Heart attack Father   . Cancer Father     prostate  . Hyperlipidemia Brother   . Hypertension Brother   . Diabetes Brother   . Neuropathy Brother   . Hyperlipidemia Brother   . Hypertension Brother   . Heart attack Paternal Grandmother   . Heart attack Paternal Grandfather   . Stroke Paternal Grandfather     Social History Social History  Substance Use Topics  . Smoking status: Never Smoker  . Smokeless tobacco: Never Used  . Alcohol use No     Allergies   Penicillin g; Penicillins; Zofran [ondansetron hcl]; Azithromycin; Bupropion; Cimetidine; Conjugated estrogens; Cyclobenzaprine hcl; Dicyclomine hcl; Estrogens, conjugated; Flagyl [metronidazole]; Lisinopril; Norco [hydrocodone-acetaminophen]; Paroxetine; Percocet [oxycodone-acetaminophen]; Rocephin [ceftriaxone]; Rosuvastatin; Sulfa antibiotics; Wellbutrin [bupropion hcl]; Ciprofloxacin; Doxycycline; Doxycycline hyclate; Erythromycin base; Erythromycin base; and Prednisone   Review of Systems Review of Systems  Musculoskeletal: Positive for arthralgias.       Left knee pain  Neurological: Negative for numbness.     Physical Exam Updated Vital Signs BP 131/71 (BP Location: Left Arm)   Pulse 88   Temp 98.4 F (36.9 C) (Oral)   Resp 20   Ht 5\' 5"  (1.651 m)   Wt 107.5 kg   LMP 02/10/2015   SpO2 99%   BMI 39.44 kg/m   Physical Exam  Constitutional: She is oriented to person, place, and time. She appears well-developed and well-nourished. No distress.  Walking with a cane.   HENT:  Facial droop on the left, hx Bell's Palsy.  Eyes: EOM are normal.  Neck: Neck supple.  Cardiovascular: Normal rate.   Pulmonary/Chest: Effort normal.  Musculoskeletal:       Right knee:  She exhibits swelling. She exhibits no deformity, no erythema, normal alignment and normal patellar mobility. Decreased range of motion: due to pain. Tenderness found. MCL and LCL tenderness noted.  Pedal pulses 2+, adequate circulation. Slightly increased warmth right knee compared to left.   Neurological: She is alert and oriented to person, place, and time. No cranial nerve deficit.  Skin: Skin is warm and dry.  Psychiatric: She has a normal mood and affect. Her behavior is normal.  Nursing note and vitals reviewed.    ED Treatments / Results  Labs (all labs ordered are listed, but only abnormal results are displayed) Labs Reviewed - No data to display  Radiology Dg Knee Complete 4 Views Left  Result Date: 01/29/2016 CLINICAL DATA:  Knee pain  without known injury. EXAM: LEFT KNEE - COMPLETE 4+ VIEW COMPARISON:  None. FINDINGS: Mild medial compartment narrowing and patellofemoral compartment narrowing. No fracture or dislocation. No effusion. No worrisome osseous lesion. Soft tissues unremarkable. IMPRESSION: Negative. Electronically Signed   By: Staci Righter M.D.   On: 01/29/2016 17:39    Procedures Procedures (including critical care time)  Medications Ordered in ED Medications - No data to display   Initial Impression / Assessment and Plan / ED Course  I have reviewed the triage vital signs and the nursing notes.  Pertinent imaging results that were available during my care of the patient were reviewed by me and considered in my medical decision making (see chart for details).  Knee immobilizer applied prior to d/c.  Clinical Course   Final Clinical Impressions(s) / ED Diagnoses  50 y.o. female with right knee pain stable for d/c without focal neuro deficits. Discussed with the patient clinical and x-ray findings and plan of care. All questioned fully answered. She will f/u with ortho or return here if any problems arise. She will take her 800 mg ibuprofen for pain and  inflammation.   Final diagnoses:  Acute pain of right knee    New Prescriptions New Prescriptions   No medications on file     [facility], NP 01/29/16 [place], NP 01/29/16 2054    Ezequiel Essex, MD 01/30/16 [phone]

## 2016-02-02 ENCOUNTER — Ambulatory Visit (INDEPENDENT_AMBULATORY_CARE_PROVIDER_SITE_OTHER): Payer: Commercial Managed Care - HMO | Admitting: Psychiatry

## 2016-02-02 DIAGNOSIS — F411 Generalized anxiety disorder: Secondary | ICD-10-CM | POA: Diagnosis not present

## 2016-02-03 ENCOUNTER — Ambulatory Visit (INDEPENDENT_AMBULATORY_CARE_PROVIDER_SITE_OTHER): Payer: Commercial Managed Care - HMO | Admitting: Family Medicine

## 2016-02-03 ENCOUNTER — Encounter: Payer: Self-pay | Admitting: Family Medicine

## 2016-02-03 ENCOUNTER — Ambulatory Visit (INDEPENDENT_AMBULATORY_CARE_PROVIDER_SITE_OTHER): Payer: Commercial Managed Care - HMO | Admitting: Psychiatry

## 2016-02-03 VITALS — BP 104/62 | HR 91 | Temp 98.5°F | Ht 65.0 in | Wt 240.5 lb

## 2016-02-03 DIAGNOSIS — M25562 Pain in left knee: Secondary | ICD-10-CM

## 2016-02-03 NOTE — Progress Notes (Signed)
Pre visit review using our clinic review tool, if applicable. No additional management support is needed unless otherwise documented below in the visit note. 

## 2016-02-03 NOTE — Progress Notes (Signed)
Dr. Frederico Hamman T. Meliton Samad, MD, [facility] [place], [address] Phone: [phone] Fax: [phone]  02/03/2016  Patient: Cassandra Mcconnell, MRN: [id number], DOB: 04/24/1966, 50 y.o.  Primary Physician:  Arnette Norris, MD   Chief Complaint  Patient presents with  . Knee Pain    Left-Seen in ED on Friday night-told she had fluid on her knee   Subjective:   Cassandra Mcconnell is a 50 y.o. very pleasant female patient who presents with the following:  F/u ER visit on Friday night.   Pleasant patient with Body mass index is 40.02 kg/m.  Who presents with acute left-sided knee pain and mechanical symptoms.  One week ago she attempted to stand up, felt a pop in her knee, had immediate knee swelling and  Felt something move around inside of her knee.  Since that time, she has been unable to fully move her knee and has had dramatic restriction of range of motion.   She initially was using a cane to walk around.  History is also significant for a prior fall in the spring of this year.  X-rays from 1 week ago are remarkable only for minimal medial compartmental osteoarthritis.  These were reviewed face-to-face with the patient in the office at the time  Of examination.  Past Medical History, Surgical History, Social History, Family History, Problem List, Medications, and Allergies have been reviewed and updated if relevant.  Patient Active Problem List   Diagnosis Date Noted  . Onychomycosis 10/15/2015  . Bell's palsy 09/29/2015  . Cutaneous skin tags 02/18/2015  . Complex ovarian cyst 10/15/2014  . Diverticulitis 10/12/2014  . Severe obesity (BMI >= 40) ([place]) 05/26/2014  . Depression 05/31/2013  . Morbid obesity ([place]) 12/04/2009  . 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 HYPERTROPHY, BREAST 11/20/2006  . Anxiety state 11/10/2006  . GERD 11/10/2006  . HYPERCHOLESTEROLEMIA, PURE 11/09/2006  . HYPERTENSION, BENIGN ESSENTIAL  11/09/2006  . CHOLECYSTECTOMY, HX OF 11/09/2006    Past Medical History:  Diagnosis Date  . Anxiety   . Back pain   . Bell's palsy    Left side, 2009, 2017  . Depression   . Diverticulitis   . GERD (gastroesophageal reflux disease)   . Hyperlipidemia   . Hypertension   . Wears glasses     Past Surgical History:  Procedure Laterality Date  . BACK SURGERY  2004   l4/5 fusion  . BREAST REDUCTION SURGERY Bilateral 08/08/2014   Procedure: BILATERAL BREAST REDUCTION  (BREAST);  Surgeon: Irene Limbo, MD;  Location: [facility];  Service: Plastics;  Laterality: Bilateral;  . CHOLECYSTECTOMY     1998  . LAPAROSCOPIC BILATERAL SALPINGO OOPHERECTOMY Bilateral 03/11/2015   Procedure: LAPAROSCOPIC BILATERAL SALPINGO OOPHORECTOMY;  Surgeon: Florian Buff, MD;  Location: [facility];  Service: Gynecology;  Laterality: Bilateral;  . SEPTOPLASTY  1988  . tonsillectomy  1991    Social History   Social History  . Marital status: Married    Spouse name: N/A  . Number of children: 1  . Years of education: N/A   Occupational History  . stay at home mom    Social History Main Topics  . Smoking status: Never Smoker  . Smokeless tobacco: Never Used  . Alcohol use No  . Drug use: No  . Sexual activity: Not on file   Other Topics Concern  . Not on file   Social History Narrative  . No  narrative on file    Family History  Problem Relation Age of Onset  . COPD Mother   . Hypertension Mother   . GER disease Mother   . Heart attack Father   . Cancer Father     prostate  . Hyperlipidemia Brother   . Hypertension Brother   . Diabetes Brother   . Neuropathy Brother   . Hyperlipidemia Brother   . Hypertension Brother   . Heart attack Paternal Grandmother   . Heart attack Paternal Grandfather   . Stroke Paternal Grandfather     Allergies  Allergen Reactions  . Penicillin G Shortness Of Breath  . Penicillins Shortness Of Breath    Pt tolerated ceftriaxone given  in ED 10/12/14  . Zofran [Ondansetron Hcl] Shortness Of Breath    Numbness,  . Azithromycin Swelling  . Bupropion Other (See Comments)    Emotional   . Cimetidine Other (See Comments)    unknown  . Conjugated Estrogens Other (See Comments)    emotional  . Cyclobenzaprine Hcl     REACTION: disorientation  . Dicyclomine Hcl Other (See Comments)    unknown  . Estrogens, Conjugated Other (See Comments)    emotional  . Flagyl [Metronidazole] Nausea Only  . Lisinopril     REACTION: RASH AND MOUTH SORE  . Norco [Hydrocodone-Acetaminophen] Other (See Comments)    Felt like she was going through withdrawal  . Paroxetine Other (See Comments)    Unknown  . Percocet [Oxycodone-Acetaminophen]     Hypotension   . Rocephin [Ceftriaxone] Nausea Only  . Rosuvastatin Dermatitis  . Sulfa Antibiotics Hives and Itching  . Wellbutrin [Bupropion Hcl] Other (See Comments)    Emotional, makes depression worse  . Ciprofloxacin Itching and Rash  . Doxycycline Rash  . Doxycycline Hyclate Itching and Rash  . Erythromycin Base Itching and Rash  . Erythromycin Base Rash  . Prednisone Rash    Medication list reviewed and updated in full in [facility].  GEN: No fevers, chills. Nontoxic. Primarily MSK c/o today. MSK: Detailed in the HPI GI: tolerating PO intake without difficulty Neuro: No numbness, parasthesias, or tingling associated. Otherwise the pertinent positives of the ROS are noted above.   Objective:   BP 104/62   Pulse 91   Temp 98.5 F (36.9 C) (Oral)   Ht 5\' 5"  (1.651 m)   Wt 240 lb 8 oz (109.1 kg)   LMP 02/10/2015   BMI 40.02 kg/m    GEN: WDWN, NAD, Non-toxic, Alert & Oriented x 3 HEENT: Atraumatic, Normocephalic.  Ears and Nose: No external deformity. EXTR: No clubbing/cyanosis/edema NEURO: notably limping PSYCH: Normally interactive. Conversant. Not depressed or anxious appearing.  Calm demeanor.    Left knee: The patient is tender throughout the entirety of the  patella.   She is tender with both medial and lateral deviation of the patella.  The patient lacks 5 of extension.  She is only able to bend her knee to approximately 80. Significant medial and lateral joint line tenderness. There is no significant tenderness at the tibial tubercle.  No significant tenderness at the present anserine bursa.  Dramatic tenderness and pain with stress of the MCL and LCL. Significant stress and pain when attempting to do Lachman maneuver or anterior and posterior drawer testing. Flexion pinch testing is markedly positive.  Bounce home testing is markedly positive. McMurray's testing is markedly positive. Examination halted secondary to pain.  Radiology: Dg Knee Complete 4 Views Left  Result Date: 01/29/2016 CLINICAL  DATA:  Knee pain without known injury. EXAM: LEFT KNEE - COMPLETE 4+ VIEW COMPARISON:  None. FINDINGS: Mild medial compartment narrowing and patellofemoral compartment narrowing. No fracture or dislocation. No effusion. No worrisome osseous lesion. Soft tissues unremarkable. IMPRESSION: Negative. Electronically Signed   By: Staci Righter M.D.   On: 01/29/2016 17:39    Assessment and Plan:   Acute pain of left knee - Plan: MR Knee Left  Wo Contrast  Dramatic Pain on multiple aspects of the patient's knee examination throughout almost the entirety of the patient's knee.  Marked restriction of motion compared to what would be expected and compared to baseline prior to initial injury.  Obtain an MRI of the left knee to evaluate for potential occult fracture not seen on  Plain x-ray,  ACL rupture, PCL rupture,  Meniscal tear, loose body, MCL tear, LCL tear, or other acute internal derangement.  MRI needed to further delineate plan of care  Follow-up: based on results of advanced imaging.  Orders Placed This Encounter  Procedures  . MR Knee Left  Wo Contrast    Signed,  Fynlee Rowlands T. Alexsandra Shontz, MD   Patient's Medications  New Prescriptions    No medications on file  Previous Medications   ACETAMINOPHEN (TYLENOL) 500 MG TABLET    Take 1,000 mg by mouth every 6 (six) hours as needed. For pain    CITALOPRAM (CELEXA) 20 MG TABLET    Take 1 tablet (20 mg total) by mouth 2 (two) times daily.   DOCUSATE SODIUM (COLACE) 50 MG CAPSULE    Take 50 mg by mouth daily as needed for mild constipation.   IBUPROFEN (ADVIL,MOTRIN) 200 MG TABLET    Take 800 mg by mouth daily as needed. For cramps   MULTIPLE VITAMIN (MULTIVITAMIN) TABLET    Take 1 tablet by mouth daily.   TRIAMCINOLONE CREAM (KENALOG) 0.1 %    APPLY TO AFFECTED AREA TWICE DAILY.  Modified Medications   No medications on file  Discontinued Medications   DIAZEPAM (VALIUM) 5 MG TABLET    Take 1 tablet (5 mg total) by mouth at bedtime as needed for anxiety.   MELOXICAM (MOBIC) 15 MG TABLET    Take 1 tablet (15 mg total) by mouth daily as needed for pain.   METOPROLOL TARTRATE (LOPRESSOR) 25 MG TABLET    Take 1 tablet (25 mg total) by mouth 2 (two) times daily.   OMEPRAZOLE (PRILOSEC) 20 MG CAPSULE    Take 20 mg by mouth daily.   SIMVASTATIN (ZOCOR) 20 MG TABLET    Take 1 tablet (20 mg total) by mouth at bedtime.

## 2016-02-03 NOTE — Patient Instructions (Signed)

## 2016-02-09 ENCOUNTER — Ambulatory Visit (HOSPITAL_COMMUNITY)
Admission: RE | Admit: 2016-02-09 | Discharge: 2016-02-09 | Disposition: A | Discharge status: Home / Self Care | Payer: Commercial Managed Care - HMO | Source: Ambulatory Visit | Attending: Family Medicine | Admitting: Family Medicine

## 2016-02-09 DIAGNOSIS — S83242A Other tear of medial meniscus, current injury, left knee, initial encounter: Secondary | ICD-10-CM | POA: Diagnosis not present

## 2016-02-09 DIAGNOSIS — M25562 Pain in left knee: Secondary | ICD-10-CM | POA: Insufficient documentation

## 2016-02-09 DIAGNOSIS — M948X6 Other specified disorders of cartilage, lower leg: Secondary | ICD-10-CM | POA: Insufficient documentation

## 2016-02-10 ENCOUNTER — Telehealth: Payer: Self-pay | Admitting: Family Medicine

## 2016-02-10 ENCOUNTER — Other Ambulatory Visit: Payer: Self-pay | Admitting: Family Medicine

## 2016-02-10 DIAGNOSIS — S83242A Other tear of medial meniscus, current injury, left knee, initial encounter: Secondary | ICD-10-CM

## 2016-02-10 NOTE — Telephone Encounter (Signed)
Patient called to get the results of her MRI. °

## 2016-02-10 NOTE — Telephone Encounter (Signed)
See note

## 2016-02-15 ENCOUNTER — Telehealth: Payer: Self-pay

## 2016-02-15 NOTE — Telephone Encounter (Signed)
Pt left v/m; for [facility] needs 6 months of documented visits for weight loss. Pt needs documentation for 6 visits last year faxed to [place] at [facility]. I see dates for weight loss on 05/26/14, 06/23/14 and 07/23/14. Pt request cb.

## 2016-02-15 NOTE — Telephone Encounter (Signed)
Spoke to pt and advised that she, unfortunately did not have 6 consecutive months of weight loss management. She began coming in Feb 2016 but [facility] or cancelled, some of the upcoming appts she has scheduled. Also spoke to [facility] and advised

## 2016-02-15 NOTE — Telephone Encounter (Signed)
Please call pt to help her with this.

## 2016-02-16 DIAGNOSIS — M1712 Unilateral primary osteoarthritis, left knee: Secondary | ICD-10-CM | POA: Diagnosis not present

## 2016-02-16 DIAGNOSIS — S83242A Other tear of medial meniscus, current injury, left knee, initial encounter: Secondary | ICD-10-CM | POA: Diagnosis not present

## 2016-02-17 ENCOUNTER — Encounter: Payer: Self-pay | Admitting: Dietician

## 2016-02-17 ENCOUNTER — Other Ambulatory Visit (HOSPITAL_COMMUNITY): Payer: Self-pay | Admitting: Surgery

## 2016-02-17 ENCOUNTER — Encounter: Payer: Commercial Managed Care - HMO | Attending: Surgery | Admitting: Dietician

## 2016-02-17 DIAGNOSIS — Z713 Dietary counseling and surveillance: Secondary | ICD-10-CM | POA: Insufficient documentation

## 2016-02-17 DIAGNOSIS — Z6841 Body Mass Index (BMI) 40.0 and over, adult: Secondary | ICD-10-CM | POA: Diagnosis not present

## 2016-02-17 NOTE — Progress Notes (Signed)
Supervised Weight Loss:  Appt start time: 425 end time:  440.  SWL:  Primary concerns today: Cassandra Mcconnell returns today having lost 10 pounds since initial visit. She has attended pre op class and has been following the pre op diet. Struggling with constipation and has been taking Colace. She has no further questions regarding bariatric surgery at this time.   Weight: 236.3 lbs BMI: 40.56  MEDICATIONS: see list  DIETARY INTAKE: Patient reports following high protein, low carbohydrate prescribed pre op diet.  Recent physical activity: did not assess  Estimated energy needs: 1400-1600 calories  Progress Towards Goal(s):  In progress.   Nutritional Diagnosis:  [place]-3.3 Overweight/obesity related to past poor dietary habits and physical inactivity as evidenced by patient in SWL for pending bariatric surgery following dietary guidelines for continued weight loss.     Intervention:  Nutrition counseling provided.  Handouts given during visit include:  none  Monitoring/Evaluation:  Dietary intake, exercise, and body weight in 4 week(s).

## 2016-02-26 ENCOUNTER — Ambulatory Visit (HOSPITAL_COMMUNITY)
Admission: RE | Admit: 2016-02-26 | Discharge: 2016-02-26 | Disposition: A | Discharge status: Home / Self Care | Payer: Commercial Managed Care - HMO | Source: Ambulatory Visit | Attending: Surgery | Admitting: Surgery

## 2016-02-26 DIAGNOSIS — Z6841 Body Mass Index (BMI) 40.0 and over, adult: Secondary | ICD-10-CM | POA: Insufficient documentation

## 2016-02-26 DIAGNOSIS — I10 Essential (primary) hypertension: Secondary | ICD-10-CM | POA: Diagnosis not present

## 2016-02-26 DIAGNOSIS — Z01818 Encounter for other preprocedural examination: Secondary | ICD-10-CM | POA: Diagnosis not present

## 2016-03-11 NOTE — Progress Notes (Signed)
Pt is being scheduled for preop appt; please place surgical orders in epic. Thanks.  

## 2016-03-14 NOTE — Progress Notes (Signed)
[name], please PLACE SURGICAL ORDERS IN EPIC  Thanks

## 2016-03-15 NOTE — Progress Notes (Signed)
Dr Georg Ruddle place pre op SURGICAL ORDERS in Epic-  Her appt is Monday 04/20/16   thanks

## 2016-03-16 ENCOUNTER — Other Ambulatory Visit: Payer: Self-pay | Admitting: Family Medicine

## 2016-03-16 ENCOUNTER — Ambulatory Visit: Payer: Self-pay | Admitting: Dietician

## 2016-03-16 ENCOUNTER — Other Ambulatory Visit: Payer: Self-pay | Admitting: Surgery

## 2016-03-16 DIAGNOSIS — K449 Diaphragmatic hernia without obstruction or gangrene: Secondary | ICD-10-CM | POA: Diagnosis not present

## 2016-03-16 DIAGNOSIS — G510 Bell's palsy: Secondary | ICD-10-CM | POA: Diagnosis not present

## 2016-03-16 DIAGNOSIS — K219 Gastro-esophageal reflux disease without esophagitis: Secondary | ICD-10-CM | POA: Diagnosis not present

## 2016-03-16 DIAGNOSIS — Z6841 Body Mass Index (BMI) 40.0 and over, adult: Secondary | ICD-10-CM | POA: Diagnosis not present

## 2016-03-21 ENCOUNTER — Encounter (HOSPITAL_COMMUNITY): Payer: Self-pay | Admitting: *Deleted

## 2016-03-21 ENCOUNTER — Encounter (HOSPITAL_COMMUNITY)
Admission: RE | Admit: 2016-03-21 | Discharge: 2016-03-21 | Disposition: A | Discharge status: Home / Self Care | Payer: Commercial Managed Care - HMO | Source: Ambulatory Visit | Attending: Surgery | Admitting: Surgery

## 2016-03-21 DIAGNOSIS — Z01812 Encounter for preprocedural laboratory examination: Secondary | ICD-10-CM

## 2016-03-21 DIAGNOSIS — I10 Essential (primary) hypertension: Secondary | ICD-10-CM | POA: Diagnosis not present

## 2016-03-21 DIAGNOSIS — Z6841 Body Mass Index (BMI) 40.0 and over, adult: Secondary | ICD-10-CM | POA: Diagnosis not present

## 2016-03-21 DIAGNOSIS — G510 Bell's palsy: Secondary | ICD-10-CM | POA: Diagnosis not present

## 2016-03-21 DIAGNOSIS — E7800 Pure hypercholesterolemia, unspecified: Secondary | ICD-10-CM | POA: Diagnosis not present

## 2016-03-21 DIAGNOSIS — F419 Anxiety disorder, unspecified: Secondary | ICD-10-CM | POA: Diagnosis not present

## 2016-03-21 DIAGNOSIS — Z79899 Other long term (current) drug therapy: Secondary | ICD-10-CM | POA: Diagnosis not present

## 2016-03-21 DIAGNOSIS — K219 Gastro-esophageal reflux disease without esophagitis: Secondary | ICD-10-CM | POA: Diagnosis not present

## 2016-03-21 DIAGNOSIS — K449 Diaphragmatic hernia without obstruction or gangrene: Secondary | ICD-10-CM | POA: Diagnosis not present

## 2016-03-21 DIAGNOSIS — F329 Major depressive disorder, single episode, unspecified: Secondary | ICD-10-CM | POA: Diagnosis not present

## 2016-03-21 HISTORY — DX: Personal history of other diseases of the digestive system: Z87.19

## 2016-03-21 LAB — BASIC METABOLIC PANEL
ANION GAP: 6 (ref 5–15)
BUN: 28 mg/dL — ABNORMAL HIGH (ref 6–20)
CALCIUM: 9.6 mg/dL (ref 8.9–10.3)
CHLORIDE: 108 mmol/L (ref 101–111)
CO2: 26 mmol/L (ref 22–32)
Creatinine, Ser: 0.75 mg/dL (ref 0.44–1.00)
GFR calc non Af Amer: >60 mL/min (ref >60)
GLUCOSE: 93 mg/dL (ref 65–99)
POTASSIUM: 4.1 mmol/L (ref 3.5–5.1)
Sodium: 140 mmol/L (ref 135–145)

## 2016-03-21 LAB — CBC
HEMATOCRIT: 40 % (ref 36.0–46.0)
HEMOGLOBIN: 13.2 g/dL (ref 12.0–15.0)
MCH: 27.6 pg (ref 26.0–34.0)
MCHC: 33 g/dL (ref 30.0–36.0)
MCV: 83.5 fL (ref 78.0–100.0)
Platelets: 257 10*3/uL (ref 150–400)
RBC: 4.79 MIL/uL (ref 3.87–5.11)
RDW: 14.8 % (ref 11.5–15.5)
WBC: 10.3 10*3/uL (ref 4.0–10.5)

## 2016-03-21 NOTE — H&P (Signed)
Cassandra Mcconnell. [name]  Location: [facility] Surgery Patient #: [id number] DOB: 12-Apr-1966 Married / Language: English / Race: White Female   History of Present Illness   The patient is a 50 year old female who presents with a complaint of weight loss surgery.   The PCP is Dr. Bjorn Loser  The patient was referred by Dr. Bjorn Loser  She comes by herself. Her husband works for the [employer] and could not come.   She is scheduled for her sleeve gastrectomy next Tuesday, 12/28. She is excited about surgery. She has lost 7 pounds since I last saw her. She is trying to make conscious decisions about her eating.  Her UGI on 01/04/2016 showed a small HH and mild esophageal dysmotility. She saw Lauris Chroman for psychology.  I talked to her about the [facility] and possibly repairing that at the time of surgery. Her pastor's wife - Cassandra Mcconnell - had a sleeve gastrectomy by Dr. Kieth Brightly a couple of months ago and is doing well.  As far as our pre op meds - she cannot take Zofran. But she does well with phenergan.  History of weight problems: The patient has been to the online seminar. She is interested in a sleeve gastrectomy. Her cousin, her pastor's wife, and a friend in [place] have had a sleeve gastrectomy and done well. She has tried multiple diets including Weight Watchers and Slim fast. She's primarily tried counting calories has lost as much as 15-20 pounds. She tried some pills for her appetite, but her sister had some trouble with Fen/Phen and heart disease, so she is not taking pills.  Per the [place], the patient is a candidate for bariatric surgery. The patient attended our initial information session and reviewed the types of bariatric surgery.  The patient is interested in the sleeve gastrectomy. I discussed with the patient the indications and risks of bariatric surgery. The potential risks of surgery  include, but are not limited to, bleeding, infection, leak from the bowel, DVT and PE, open surgery, long term nutrition consequences, and death. The patient understands the importance of compliance and long term follow-up with our group after surgery.  Past Medical History: 1. Bell's palsy - Dr. Marcella Dubs Dr. Roxy Cedar - since Sep 22, 2015. This is her second bout with this. 2. HTN 3. History of bilateral salpingo oophorectomy Dr. Marciano Sequin Linna Hoff - Nov 2016. She did well from this surgery and sees this as to how she will do with weight loss surgery. 4. History of diverticulitis - June 2016 She has never had a colonoscopy 5. Depression 6. Breast reduction - 07/2014 - Dr. Iran Planas 7. Chest pain seoncdary to anxiety 8. Gall bladder out - 1998 9. Back trouble L5-S1 - G. Cram - 2004 10. On disability since 2004 for her back 11. She saw Lauris Chroman for psychology.  Social History: Married Her husband is working for the [employer] - he has busy hours ... so he did not make her pre op visit. On disability since 2004 She has 2 children (both adopted) - 87 yo daughter and 26 yo son   Other Problems Shann Medal, MD; 03/16/2016 8:27 AM) Anxiety Disorder Arthritis Back Pain Cholelithiasis Diverticulosis Gastroesophageal Reflux Disease Hemorrhoids High blood pressure Hypercholesterolemia Oophorectomy Bilateral.  Past Surgical History Shann Medal, MD; 03/16/2016 8:27 AM) Gallbladder Surgery - Laparoscopic Hysterectomy (not due to cancer) - Partial Mammoplasty; Reduction Bilateral. Oral Surgery Spinal Surgery - Lower Back Tonsillectomy  Allergies (Armen Eulas Post, CMA; 03/16/2016 10:41 AM) PAROXETINE Percocet *ANALGESICS - OPIOID* Rocephin *CEPHALOSPORINS* Sulfa Antibiotics Hives, Itching. Wellbutrin *ANTIDEPRESSANTS* CIPROFLOXACIN Itching, Rash. DOXYCYCLINE Rash. PREDNISONE Rash. Zithromax Z-Pak  *MACROLIDES* CefTRIAXone Sodium *CHEMICALS* Dicyclomine HCl *CHEMICALS* Lisinopril *ANTIHYPERTENSIVES* Penicillin V *PENICILLINS* Anaphylaxis. Erythromycin *OPHTHALMIC AGENTS* Itching, Rash. Cyclobenzaprine *CHEMICALS* Vicodin *ANALGESICS - OPIOID* MetroNIDAZOLE *ANTI-INFECTIVE AGENTS - MISC.* Sulfa 10 *OPHTHALMIC AGENTS* PROzac *ANTIDEPRESSANTS*  Medication History (Armen Glenn, CMA; 03/16/2016 10:43 AM) Simvastatin (20MG  Tablet, Oral) Active. Metoprolol Tartrate (25MG  Tablet, Oral) Active. CeleXA (20MG  Tablet, Oral) Active. Multivitamins (Oral) Active. Acetaminophen (500MG  Tablet, Oral) Active. Colace (50MG  Capsule, Oral) Active. Triamcinolone Acetonide (Top) (0.1% Cream, External) Active. Refresh Dry Eye Therapy (1-1% Solution, Ophthalmic) Active. Medications Reconciled  Vitals (Armen Glenn CMA; 03/16/2016 10:36 AM) 03/16/2016 10:36 AM Weight: 235.5 lb Height: 65in Body Surface Area: 2.12 m Body Mass Index: 39.19 kg/m  Temp.: 98.57F  Pulse: 81 (Regular)  P.OX: 96% (Room air) BP: 138/82 (Sitting, Left Arm, Standard)   Physical Exam  General: WN WF alert and generally healthy appearing.  Skin: Inspection and palpation of the skin unremarkable.  Eyes: Conjunctivae white, pupils equal. Face, ears, nose, mouth, and throat: Face - Left Bell's palsy with weakness of the left side of her face. Normal ears and nose. Lips and teeth normal.   Neck: Supple. No mass. Trachea midline. No thyroid mass.  Lymph Nodes: No supraclavicular or cervical adenopathy. No axillary adenopathy.  Lungs: Normal respiratory effort. Clear to auscultation and symmetric breath sounds. Cardiovascular: Regular rate and rythm. Normal auscultation of the heart. No murmur or rub.  Breast: Right - reduction mammoplasty Left: reduction mammoplasty  Abdomen: Soft. No mass. Liver and spleen not palpable. No tenderness. No hernia. Normal bowel sounds.  Pannus - moderate She is more apple than pear.  Musculoskeletal/extremities: Normal gait. Good strength and ROM in upper and lower extremities.   Neurologic: Grossly intact to motor and sensory function.   Psychiatric: Has normal mood and affect. Judgement and insight appear normal.   Assessment & Plan  1. MORBID OBESITY WITH BMI OF 40.0-44.9, ADULT (E66.01)  Plan:  1) Laparoscopic sleeve gastrectomy scheduled for 03/22/2016. She is ready.  2.  HIATAL HERNIA WITH GERD (K21.9)  Impression: Will evaluate this at the time of surgery. 3.  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 HTN 5. History of bilateral salpingo oophorectomy Dr. Marciano Sequin Linna Hoff - Nov 2016. She did well from this surgery and sees this as to how she will do with weight loss surgery. 6. History of diverticulitis - June 2016 She has never had a colonoscopy 7. Depression 8. Breast reduction - 07/2014 - Dr. Iran Planas 9. Chest pain seoncdary to anxiety 10. Back trouble  L5-S1 - G. Cram - 2004 11. On disability since 2004 for her back   Alphonsa Overall, MD, [facility] Surgery Pager: [phone] Office phone:  [phone]

## 2016-03-21 NOTE — Progress Notes (Signed)
BMP results in epic per PAT visit 03/21/2016 sent to [name]

## 2016-03-21 NOTE — Patient Instructions (Signed)
Cassandra Mcconnell  03/21/2016   Your procedure is scheduled on: Tuesday March 22, 2016   Report to [facility] Main  Entrance take [place]  elevators to 3rd floor to  [place] at 9:30 AM.  Call this number if you have problems the morning of surgery [phone]   Remember: ONLY 1 PERSON MAY GO WITH YOU TO SHORT STAY TO GET  READY MORNING OF [place].  Do not eat food or drink liquids :After Midnight.     Take these medicines the morning of surgery with A SIP OF WATER: Citalopram (Celexa)                                You may not have any metal on your body including hair pins and              piercings  Do not wear jewelry, make-up, lotions, powders or perfumes, deodorant             Do not wear nail polish.  Do not shave  48 hours prior to surgery.     Do not bring valuables to the hospital. [place].  Contacts, dentures or bridgework may not be worn into surgery.  Leave suitcase in the car. After surgery it may be brought to your room.              [place] - Preparing for Surgery Before surgery, you can play an important role.  Because skin is not sterile, your skin needs to be as free of germs as possible.  You can reduce the number of germs on your skin by washing with CHG (chlorahexidine gluconate) soap before surgery.  CHG is an antiseptic cleaner which kills germs and bonds with the skin to continue killing germs even after washing. Please DO NOT use if you have an allergy to CHG or antibacterial soaps.  If your skin becomes reddened/irritated stop using the CHG and inform your nurse when you arrive at Short Stay. Do not shave (including legs and underarms) for at least 48 hours prior to the first CHG shower.  You may shave your face/neck. Please follow these instructions carefully:  1.  Shower with CHG Soap the night before surgery and the  morning of Surgery.  2.  If you choose to  wash your hair, wash your hair first as usual with your  normal  shampoo.  3.  After you shampoo, rinse your hair and body thoroughly to remove the  shampoo.                           4.  Use CHG as you would any other liquid soap.  You can apply chg directly  to the skin and wash                       Gently with a scrungie or clean washcloth.  5.  Apply the CHG Soap to your body ONLY FROM THE NECK DOWN.   Do not use on face/ open  Wound or open sores. Avoid contact with eyes, ears mouth and genitals (private parts).                       Wash face,  Genitals (private parts) with your normal soap.             6.  Wash thoroughly, paying special attention to the area where your surgery  will be performed.  7.  Thoroughly rinse your body with warm water from the neck down.  8.  DO NOT shower/wash with your normal soap after using and rinsing off  the CHG Soap.                9.  Pat yourself dry with a clean towel.            10.  Wear clean pajamas.            11.  Place clean sheets on your bed the night of your first shower and do not  sleep with pets. Day of Surgery : Do not apply any lotions/deodorants the morning of surgery.  Please wear clean clothes to the hospital/surgery center.  FAILURE TO FOLLOW THESE INSTRUCTIONS MAY RESULT IN THE CANCELLATION OF YOUR SURGERY PATIENT SIGNATURE_________________________________  NURSE SIGNATURE__________________________________  ________________________________________________________________________

## 2016-03-22 ENCOUNTER — Inpatient Hospital Stay (HOSPITAL_COMMUNITY)
Admission: RE | Admit: 2016-03-22 | Discharge: 2016-03-23 | DRG: 621 | Disposition: A | Discharge status: Home / Self Care | Payer: Commercial Managed Care - HMO | Source: Ambulatory Visit | Attending: Surgery | Admitting: Surgery

## 2016-03-22 ENCOUNTER — Encounter (HOSPITAL_COMMUNITY)
Admission: RE | Disposition: A | Discharge status: Home / Self Care | Payer: Self-pay | Source: Ambulatory Visit | Attending: Surgery

## 2016-03-22 ENCOUNTER — Inpatient Hospital Stay (HOSPITAL_COMMUNITY): Payer: Commercial Managed Care - HMO | Admitting: Anesthesiology

## 2016-03-22 ENCOUNTER — Encounter (HOSPITAL_COMMUNITY): Payer: Self-pay | Admitting: *Deleted

## 2016-03-22 DIAGNOSIS — K449 Diaphragmatic hernia without obstruction or gangrene: Secondary | ICD-10-CM | POA: Diagnosis present

## 2016-03-22 DIAGNOSIS — Z79899 Other long term (current) drug therapy: Secondary | ICD-10-CM | POA: Diagnosis not present

## 2016-03-22 DIAGNOSIS — F419 Anxiety disorder, unspecified: Secondary | ICD-10-CM | POA: Diagnosis present

## 2016-03-22 DIAGNOSIS — G510 Bell's palsy: Secondary | ICD-10-CM | POA: Diagnosis present

## 2016-03-22 DIAGNOSIS — F329 Major depressive disorder, single episode, unspecified: Secondary | ICD-10-CM | POA: Diagnosis present

## 2016-03-22 DIAGNOSIS — Z6841 Body Mass Index (BMI) 40.0 and over, adult: Secondary | ICD-10-CM | POA: Diagnosis not present

## 2016-03-22 DIAGNOSIS — K219 Gastro-esophageal reflux disease without esophagitis: Secondary | ICD-10-CM | POA: Diagnosis present

## 2016-03-22 DIAGNOSIS — E7800 Pure hypercholesterolemia, unspecified: Secondary | ICD-10-CM | POA: Diagnosis present

## 2016-03-22 DIAGNOSIS — I10 Essential (primary) hypertension: Secondary | ICD-10-CM | POA: Diagnosis present

## 2016-03-22 HISTORY — PX: LAPAROSCOPIC GASTRIC SLEEVE RESECTION WITH HIATAL HERNIA REPAIR: SHX6512

## 2016-03-22 LAB — HEMOGLOBIN AND HEMATOCRIT, BLOOD
HEMATOCRIT: 40 % (ref 36.0–46.0)
HEMOGLOBIN: 13.1 g/dL (ref 12.0–15.0)

## 2016-03-22 SURGERY — GASTRECTOMY, SLEEVE, LAPAROSCOPIC, WITH HIATAL HERNIA REPAIR
Anesthesia: General | Site: Abdomen

## 2016-03-22 MED ORDER — SUCCINYLCHOLINE CHLORIDE 200 MG/10ML IV SOSY
PREFILLED_SYRINGE | INTRAVENOUS | Status: DC | PRN
  Administered 2016-03-22: 120 mg via INTRAVENOUS

## 2016-03-22 MED ORDER — APREPITANT 80 MG PO CAPS
80.0000 mg | ORAL_CAPSULE | ORAL | Status: AC
  Administered 2016-03-22: 80 mg via ORAL
  Filled 2016-03-22: qty 1

## 2016-03-22 MED ORDER — ROCURONIUM BROMIDE 50 MG/5ML IV SOSY
PREFILLED_SYRINGE | INTRAVENOUS | Status: AC
  Filled 2016-03-22: qty 5

## 2016-03-22 MED ORDER — HEPARIN SODIUM (PORCINE) 5000 UNIT/ML IJ SOLN
5000.0000 [IU] | Freq: Three times a day (TID) | INTRAMUSCULAR | Status: DC
  Administered 2016-03-22 – 2016-03-23 (×2): 5000 [IU] via SUBCUTANEOUS
  Filled 2016-03-22 (×3): qty 1

## 2016-03-22 MED ORDER — LIDOCAINE 2% (20 MG/ML) 5 ML SYRINGE
INTRAMUSCULAR | Status: DC | PRN
Start: 2016-03-22 — End: 2016-03-22
  Administered 2016-03-22: 60 mg via INTRAVENOUS
  Administered 2016-03-22: 80 mg via INTRAVENOUS

## 2016-03-22 MED ORDER — FENTANYL CITRATE (PF) 250 MCG/5ML IJ SOLN
INTRAMUSCULAR | Status: DC | PRN
  Administered 2016-03-22 (×3): 50 ug via INTRAVENOUS

## 2016-03-22 MED ORDER — OXYCODONE HCL 5 MG/5ML PO SOLN: 5.0000 mg | ORAL | Status: DC | PRN

## 2016-03-22 MED ORDER — TISSEEL VH 10 ML EX KIT
PACK | CUTANEOUS | Status: DC | PRN
  Administered 2016-03-22: 10 mL

## 2016-03-22 MED ORDER — ACETAMINOPHEN 160 MG/5ML PO SOLN: 325.0000 mg | ORAL | Status: DC | PRN

## 2016-03-22 MED ORDER — ROCURONIUM BROMIDE 50 MG/5ML IV SOSY
PREFILLED_SYRINGE | INTRAVENOUS | Status: DC | PRN
  Administered 2016-03-22: 10 mg via INTRAVENOUS
  Administered 2016-03-22: 40 mg via INTRAVENOUS

## 2016-03-22 MED ORDER — MEPERIDINE HCL 50 MG/ML IJ SOLN: 6.2500 mg | INTRAMUSCULAR | Status: DC | PRN

## 2016-03-22 MED ORDER — FENTANYL CITRATE (PF) 250 MCG/5ML IJ SOLN
INTRAMUSCULAR | Status: AC
  Filled 2016-03-22: qty 5

## 2016-03-22 MED ORDER — EPHEDRINE SULFATE 50 MG/ML IJ SOLN
INTRAMUSCULAR | Status: DC | PRN
  Administered 2016-03-22: 10 mg via INTRAVENOUS

## 2016-03-22 MED ORDER — LABETALOL HCL 5 MG/ML IV SOLN
5.0000 mg | INTRAVENOUS | Status: DC | PRN
  Administered 2016-03-22: 5 mg via INTRAVENOUS

## 2016-03-22 MED ORDER — MIDAZOLAM HCL 2 MG/2ML IJ SOLN
INTRAMUSCULAR | Status: AC
  Filled 2016-03-22: qty 2

## 2016-03-22 MED ORDER — CHLORHEXIDINE GLUCONATE 0.12 % MT SOLN
15.0000 mL | Freq: Two times a day (BID) | OROMUCOSAL | Status: DC
  Administered 2016-03-22: 15 mL via OROMUCOSAL
  Filled 2016-03-22: qty 15

## 2016-03-22 MED ORDER — KCL IN DEXTROSE-NACL 20-5-0.45 MEQ/L-%-% IV SOLN
INTRAVENOUS | Status: DC
  Administered 2016-03-22 – 2016-03-23 (×2): via INTRAVENOUS
  Filled 2016-03-22 (×2): qty 1000

## 2016-03-22 MED ORDER — SUCCINYLCHOLINE CHLORIDE 200 MG/10ML IV SOSY
PREFILLED_SYRINGE | INTRAVENOUS | Status: AC
  Filled 2016-03-22: qty 10

## 2016-03-22 MED ORDER — BUPIVACAINE HCL (PF) 0.5 % IJ SOLN
INTRAMUSCULAR | Status: AC
  Filled 2016-03-22: qty 30

## 2016-03-22 MED ORDER — MIDAZOLAM HCL 5 MG/5ML IJ SOLN
INTRAMUSCULAR | Status: DC | PRN
  Administered 2016-03-22: 2 mg via INTRAVENOUS

## 2016-03-22 MED ORDER — STERILE WATER FOR IRRIGATION IR SOLN
Status: DC | PRN
  Administered 2016-03-22: 2000 mL

## 2016-03-22 MED ORDER — ORAL CARE MOUTH RINSE: 15.0000 mL | Freq: Two times a day (BID) | OROMUCOSAL | Status: DC

## 2016-03-22 MED ORDER — SUGAMMADEX SODIUM 200 MG/2ML IV SOLN
INTRAVENOUS | Status: AC
  Filled 2016-03-22: qty 2

## 2016-03-22 MED ORDER — PROMETHAZINE HCL 25 MG/ML IJ SOLN
12.5000 mg | Freq: Four times a day (QID) | INTRAMUSCULAR | Status: DC | PRN
  Administered 2016-03-22: 25 mg via INTRAVENOUS
  Filled 2016-03-22: qty 1

## 2016-03-22 MED ORDER — HYDROMORPHONE HCL 1 MG/ML IJ SOLN
0.2500 mg | INTRAMUSCULAR | Status: DC | PRN
  Administered 2016-03-22: 0.5 mg via INTRAVENOUS

## 2016-03-22 MED ORDER — CEFOTETAN DISODIUM-DEXTROSE 2-2.08 GM-% IV SOLR
INTRAVENOUS | Status: AC
  Filled 2016-03-22: qty 50

## 2016-03-22 MED ORDER — LIDOCAINE 2% (20 MG/ML) 5 ML SYRINGE
INTRAMUSCULAR | Status: AC
  Filled 2016-03-22: qty 5

## 2016-03-22 MED ORDER — MORPHINE SULFATE (PF) 2 MG/ML IV SOLN
2.0000 mg | INTRAVENOUS | Status: DC | PRN
  Administered 2016-03-22: 2 mg via INTRAVENOUS
  Filled 2016-03-22: qty 1

## 2016-03-22 MED ORDER — LABETALOL HCL 5 MG/ML IV SOLN
INTRAVENOUS | Status: AC
  Filled 2016-03-22: qty 4

## 2016-03-22 MED ORDER — HEPARIN SODIUM (PORCINE) 5000 UNIT/ML IJ SOLN
5000.0000 [IU] | Freq: Once | INTRAMUSCULAR | Status: AC
  Administered 2016-03-22: 5000 [IU] via SUBCUTANEOUS
  Filled 2016-03-22: qty 1

## 2016-03-22 MED ORDER — LACTATED RINGERS IV SOLN
INTRAVENOUS | Status: DC
  Administered 2016-03-22 (×2): via INTRAVENOUS

## 2016-03-22 MED ORDER — DEXAMETHASONE SODIUM PHOSPHATE 10 MG/ML IJ SOLN
INTRAMUSCULAR | Status: AC
  Filled 2016-03-22: qty 1

## 2016-03-22 MED ORDER — ACETAMINOPHEN 160 MG/5ML PO SOLN
650.0000 mg | ORAL | Status: DC | PRN
  Administered 2016-03-23: 650 mg via ORAL
  Filled 2016-03-22: qty 20.3

## 2016-03-22 MED ORDER — SCOPOLAMINE 1 MG/3DAYS TD PT72
MEDICATED_PATCH | TRANSDERMAL | Status: DC | PRN
  Administered 2016-03-22: 1 via TRANSDERMAL

## 2016-03-22 MED ORDER — PREMIER PROTEIN SHAKE: 2.0000 [oz_av] | ORAL | Status: DC

## 2016-03-22 MED ORDER — PROMETHAZINE HCL 25 MG/ML IJ SOLN
6.2500 mg | INTRAMUSCULAR | Status: DC | PRN
  Administered 2016-03-22: 6.25 mg via INTRAVENOUS

## 2016-03-22 MED ORDER — 0.9 % SODIUM CHLORIDE (POUR BTL) OPTIME
TOPICAL | Status: DC | PRN
  Administered 2016-03-22: 1000 mL

## 2016-03-22 MED ORDER — SCOPOLAMINE 1 MG/3DAYS TD PT72
MEDICATED_PATCH | TRANSDERMAL | Status: AC
  Filled 2016-03-22: qty 1

## 2016-03-22 MED ORDER — DEXAMETHASONE SODIUM PHOSPHATE 10 MG/ML IJ SOLN
INTRAMUSCULAR | Status: DC | PRN
  Administered 2016-03-22: 10 mg via INTRAVENOUS

## 2016-03-22 MED ORDER — MIDAZOLAM HCL 2 MG/2ML IJ SOLN: 0.5000 mg | Freq: Once | INTRAMUSCULAR | Status: DC | PRN

## 2016-03-22 MED ORDER — HYDROMORPHONE HCL 1 MG/ML IJ SOLN
INTRAMUSCULAR | Status: AC
  Administered 2016-03-22: 0.5 mg via INTRAVENOUS
  Filled 2016-03-22: qty 1

## 2016-03-22 MED ORDER — PROPOFOL 10 MG/ML IV BOLUS
INTRAVENOUS | Status: DC | PRN
  Administered 2016-03-22: 150 mg via INTRAVENOUS

## 2016-03-22 MED ORDER — ONDANSETRON HCL 4 MG/2ML IJ SOLN
INTRAMUSCULAR | Status: DC | PRN
  Administered 2016-03-22: 4 mg via INTRAVENOUS

## 2016-03-22 MED ORDER — EPHEDRINE 5 MG/ML INJ
INTRAVENOUS | Status: AC
  Filled 2016-03-22: qty 10

## 2016-03-22 MED ORDER — PROMETHAZINE HCL 25 MG/ML IJ SOLN
INTRAMUSCULAR | Status: AC
  Filled 2016-03-22: qty 1

## 2016-03-22 MED ORDER — LACTATED RINGERS IR SOLN
Status: DC | PRN
  Administered 2016-03-22: 3000 mL

## 2016-03-22 MED ORDER — CEFOTETAN DISODIUM-DEXTROSE 2-2.08 GM-% IV SOLR
2.0000 g | INTRAVENOUS | Status: AC
  Administered 2016-03-22: 2 g via INTRAVENOUS

## 2016-03-22 MED ORDER — BUPIVACAINE HCL 0.5 % IJ SOLN
INTRAMUSCULAR | Status: DC | PRN
  Administered 2016-03-22: 30 mL

## 2016-03-22 MED ORDER — PROPOFOL 10 MG/ML IV BOLUS
INTRAVENOUS | Status: AC
  Filled 2016-03-22: qty 20

## 2016-03-22 MED ORDER — ONDANSETRON HCL 4 MG/2ML IJ SOLN
INTRAMUSCULAR | Status: AC
  Filled 2016-03-22: qty 2

## 2016-03-22 MED ORDER — SUGAMMADEX SODIUM 200 MG/2ML IV SOLN
INTRAVENOUS | Status: DC | PRN
  Administered 2016-03-22: 300 mg via INTRAVENOUS

## 2016-03-22 SURGICAL SUPPLY — 68 items
ADH SKN CLS APL DERMABOND .7 (GAUZE/BANDAGES/DRESSINGS) ×1
APL SRG 32X5 SNPLK LF DISP (MISCELLANEOUS) ×1
APPLICATOR COTTON TIP 6IN STRL (MISCELLANEOUS) IMPLANT
APPLIER CLIP ROT 10 11.4 M/L (STAPLE)
APPLIER CLIP ROT 13.4 12 LRG (CLIP)
APR CLP LRG 13.4X12 ROT 20 MLT (CLIP)
APR CLP MED LRG 11.4X10 (STAPLE)
BLADE SURG 15 STRL LF DISP TIS (BLADE) ×1 IMPLANT
BLADE SURG 15 STRL SS (BLADE) ×3
CABLE HIGH FREQUENCY MONO STRZ (ELECTRODE) ×2 IMPLANT
CHLORAPREP W/TINT 26ML (MISCELLANEOUS) ×3 IMPLANT
CLIP APPLIE ROT 10 11.4 M/L (STAPLE) IMPLANT
CLIP APPLIE ROT 13.4 12 LRG (CLIP) IMPLANT
DERMABOND ADVANCED (GAUZE/BANDAGES/DRESSINGS) ×2
DERMABOND ADVANCED .7 DNX12 (GAUZE/BANDAGES/DRESSINGS) ×1 IMPLANT
DEVICE SUT QUICK LOAD TK 5 (STAPLE) ×1 IMPLANT
DEVICE SUT TI-KNOT TK 5X26 (MISCELLANEOUS) ×1 IMPLANT
DEVICE SUTURE ENDOST 10MM (ENDOMECHANICALS) ×2 IMPLANT
DEVICE TI KNOT TK5 (MISCELLANEOUS) ×1
DEVICE TROCAR PUNCTURE CLOSURE (ENDOMECHANICALS) ×3 IMPLANT
DISSECTOR BLUNT TIP ENDO 5MM (MISCELLANEOUS) IMPLANT
DRAPE UTILITY XL STRL (DRAPES) ×6 IMPLANT
ELECT REM PT RETURN 9FT ADLT (ELECTROSURGICAL) ×3
ELECTRODE REM PT RTRN 9FT ADLT (ELECTROSURGICAL) ×1 IMPLANT
GAUZE SPONGE 4X4 12PLY STRL (GAUZE/BANDAGES/DRESSINGS) IMPLANT
GLOVE SURG SIGNA 7.5 PF LTX (GLOVE) ×3 IMPLANT
GOWN STRL REUS W/TWL XL LVL3 (GOWN DISPOSABLE) ×9 IMPLANT
HOVERMATT SINGLE USE (MISCELLANEOUS) ×3 IMPLANT
IRRIG SUCT STRYKERFLOW 2 WTIP (MISCELLANEOUS) ×3
IRRIGATION SUCT STRKRFLW 2 WTP (MISCELLANEOUS) ×1 IMPLANT
KIT BASIN OR (CUSTOM PROCEDURE TRAY) ×3 IMPLANT
MARKER SKIN DUAL TIP RULER LAB (MISCELLANEOUS) ×3 IMPLANT
NDL SPNL 22GX3.5 QUINCKE BK (NEEDLE) ×1 IMPLANT
NEEDLE SPNL 22GX3.5 QUINCKE BK (NEEDLE) ×3 IMPLANT
PACK UNIVERSAL I (CUSTOM PROCEDURE TRAY) ×3 IMPLANT
QUICK LOAD TK 5 (STAPLE) ×1
RELOAD STAPLE 60 3.6 BLU REG (STAPLE) IMPLANT
RELOAD STAPLE 60 3.8 GOLD REG (STAPLE) IMPLANT
RELOAD STAPLE 60 4.1 GRN THCK (STAPLE) ×2 IMPLANT
RELOAD STAPLER BLUE 60MM (STAPLE) ×2 IMPLANT
RELOAD STAPLER GOLD 60MM (STAPLE) ×2 IMPLANT
RELOAD STAPLER GREEN 60MM (STAPLE) ×2 IMPLANT
SCISSORS LAP 5X35 DISP (ENDOMECHANICALS) ×3 IMPLANT
SEALANT SURGICAL APPL DUAL CAN (MISCELLANEOUS) ×3 IMPLANT
SHEARS HARMONIC ACE PLUS 45CM (MISCELLANEOUS) ×3 IMPLANT
SLEEVE ADV FIXATION 5X100MM (TROCAR) ×3 IMPLANT
SLEEVE GASTRECTOMY 36FR VISIGI (MISCELLANEOUS) ×3 IMPLANT
SOLUTION ANTI FOG 6CC (MISCELLANEOUS) ×3 IMPLANT
SPONGE LAP 18X18 X RAY DECT (DISPOSABLE) ×3 IMPLANT
STAPLER ECHELON LONG 60 440 (INSTRUMENTS) ×3 IMPLANT
STAPLER RELOAD BLUE 60MM (STAPLE) ×6
STAPLER RELOAD GOLD 60MM (STAPLE) ×6
STAPLER RELOAD GREEN 60MM (STAPLE) ×6
SUT MNCRL AB 4-0 PS2 18 (SUTURE) ×5 IMPLANT
SUT SURGIDAC NAB ES-9 0 48 120 (SUTURE) ×2 IMPLANT
SUT VICRYL 0 TIES 12 18 (SUTURE) ×3 IMPLANT
SYR 10ML ECCENTRIC (SYRINGE) ×3 IMPLANT
SYR 20CC LL (SYRINGE) ×3 IMPLANT
TOWEL OR 17X26 10 PK STRL BLUE (TOWEL DISPOSABLE) ×3 IMPLANT
TOWEL OR NON WOVEN STRL DISP B (DISPOSABLE) ×3 IMPLANT
TROCAR ADV FIXATION 12X100MM (TROCAR) ×3 IMPLANT
TROCAR ADV FIXATION 5X100MM (TROCAR) ×3 IMPLANT
TROCAR BLADELESS 15MM (ENDOMECHANICALS) ×3 IMPLANT
TROCAR BLADELESS OPT 5 100 (ENDOMECHANICALS) ×3 IMPLANT
TUBING CONNECTING 10 (TUBING) ×2 IMPLANT
TUBING CONNECTING 10' (TUBING) ×1
TUBING ENDO SMARTCAP PENTAX (MISCELLANEOUS) ×3 IMPLANT
TUBING INSUF HEATED (TUBING) ×3 IMPLANT

## 2016-03-22 NOTE — Op Note (Signed)
PATIENT:   Cassandra Mcconnell DOB:   1965/09/02 MRN:   [id number]  DATE OF PROCEDURE: 03/22/2016                   FACILITY:  [facility]  OPERATIVE REPORT  PREOPERATIVE DIAGNOSIS:  Morbid obesity, hiatal hernia.  POSTOPERATIVE DIAGNOSIS:  Morbid obesity (weight 242, BMI of 40.4), hiatal hernia.  PROCEDURE:  Laparoscopic Sleeve gastrectomy (intraoperative upper endoscopy by Dr. Kae Heller), repair of hiatal hernia  SURGEON:  Fenton Malling. Lucia Gaskins, MD  FIRST ASSISTANT:  Romana Juniper, MD  ANESTHESIA:  General endotracheal.  Anesthesiologist: Annye Asa, MD CRNA: Dione Booze, CRNA  General  ESTIMATED BLOOD LOSS:  Minimal.  LOCAL ANESTHESIA:  30 cc of 0000000 Marcaine  COMPLICATIONS:  None.  INDICATION FOR SURGERY:  Cassandra Mcconnell is a 50 y.o. white female who sees Arnette Norris, MD as her primary care doctor.  She has completed our preoperative bariatric program and now comes for a laparoscopic sleeve gastrectomy.  The indications, potential complications of surgery were explained to the patient.  Potential complications of the surgery include, but are not limited to, bleeding, infection, DVT, open surgery, and long-term nutritional consequences.  OPERATIVE NOTE:  The patient taken to room #1 at [facility] where Ms. [name] underwent a general endotracheal anesthetic, supervised by Anesthesiologist: Annye Asa, MD CRNA: Dione Booze, CRNA.  The patient was given 2 g of cefotetan at the beginning of the procedure.  A time-out was held and surgical checklist run.  I accessed her abdominal cavity through the left upper quadrant with a 5 mm Optiview. I did an abdominal exploration.   Her omentum and bowel were unremarkable. The right and left lobes of the liver unremarkable. Gallbladder was absent. Her stomach was unremarkable.   She did have scattered adhesions to the anterior peritoneal surface - around the gall bladder fossa in the RUQ, along the left lower abdomen, and along the right lower  abdomen.  I spent about 10 minutes taking down these adhesions.  I placed a total of 5 trocars. I placed a 5 mm left lateral trocar, a 5 mm left paramedian trocar (for the scope), a 12 mm right paramedian torcar, a 5 mm right subcostal trocar that I converted to a 15 mm to extract the stomach and 5 mm subxiphoid trocar for the liver retractor.  I first examined the patient for a hiatal hernia.  I passed the sizing balloon and insufflated the balloon with 15 cc of air.  The balloon was pulled back to the hiatus and through the hiatus.  I then exposed the right crus and left crus behind the gastroesophageal junction and a single suture of 0 Ethibond about 1.5 cm above the base of the [place].  I secured the Ethibond with a Tye knot.   I then retested the hiatus.  With 15 cc of air in the balloon, the sizing tube held up.  With 10 cc of air, the tube barely went through the hiatus.  So I decided the I had finished closing the crura posteriorly.  I started out taking down the greater curvature attachments of the stomach. I measured approximately 6 cm proximal from the pylorus and mobilized the greater curvature of the stomach with the Harmonic Scalpel. I took this dissection cranially around the greater curvature of her stomach to the angle of His and the left crus.   After I had mobilized the greater curvature of the stomach, I then passed the 36 French ViSiGi bougie which  was used to suck the stomach up against the lesser curvature and placed into the antrum. During the staple firing,  I tried to give the [place] a cuff at least about 1 cm. I tried to avoid narrowing the incisura. I used a total of 6 staple firings.  From antrum to the angle of His, I used 2 green, 2 gold and 2 blue [place]on 60 mm Ethicon staplers. I did not use staple line reinforcement.   At each firing of the EndoGIA stapler, I inspected the stomach, anterior wall of the stomach, and underneath to make sure there was no compromise or  impingement on to the ViSiGi bougie.   The staple line seemed linear without any corkscrewing of the stomach. Hemostasis was good. I did not use any reinforcement. She had no areas of bleeding along the stomach staple line.   Because I thought we had a good staple line, I then had the ViSiGi converted to insufflate the pouch. The new stomach pouch was placed under water. There was no bubbling or leak noted.   At this point, Dr. Kae Heller broke scrub and passed an upper endoscope down through the esophagus into the stomach pouch.  The EG junction was about 35 cm.  The stomach was tubular. There was no narrowing of the stomach pouch or angulation. We were easily able to pass the endoscope into the antrum and again put air pressure on the staple line. I irrigated the upper abdomen with saline. There was no bubbling or evidence of air leak. The mucosa looked viable. Dr. Kae Heller decompressed the stomach with the endoscopy.   I converted to right subcostal trocar to a 15 trocar and extracted the stomach remnant through this intact and sent this to Pathology. I then placed 10 cc of Tisseel along the new greater curvature staple line and covered the entire staple line with the Tisseel.  I aspirated out the saline that I had irrigated because I thought the staple line looked viable and complete. There was no evidence of leak. I did not leave a drain in place.   Then, I closed the trocar sites. I placed 0 Vicryl sutures at the 15-mm port site in the right upper quadrant. The other port sites seemed smaller not requiring sutures. I closed the skin at each site with a 4-0 Monocryl, painted each wound with LiquiBand.   The patient was transported to recovery room in good condition. Sponge and needle count were correct at the end of the case.    Alphonsa Overall, MD, [facility] Surgery Pager: [phone] Office phone:  [phone]

## 2016-03-22 NOTE — Anesthesia Preprocedure Evaluation (Addendum)
Anesthesia Evaluation  Patient identified by MRN, date of birth, ID band Patient awake    Reviewed: Allergy & Precautions, NPO status , Patient's Chart, lab work & pertinent test results  History of Anesthesia Complications Negative for: history of anesthetic complications  Airway Mallampati: I  TM Distance: >3 FB Neck ROM: Full    Dental  (+) Dental Advisory Given   Pulmonary neg pulmonary ROS,    breath sounds clear to auscultation       Cardiovascular hypertension (no longer on BP meds),  Rhythm:Regular Rate:Normal     Neuro/Psych Anxiety Depression S/p Bell's palsy    GI/Hepatic Neg liver ROS, hiatal hernia, GERD  Poorly Controlled,  Endo/Other  Morbid obesity  Renal/GU negative Renal ROS     Musculoskeletal   Abdominal (+) + obese,   Peds  Hematology negative hematology ROS (+)   Anesthesia Other Findings   Reproductive/Obstetrics                            Anesthesia Physical Anesthesia Plan  ASA: III  Anesthesia Plan: General   Post-op Pain Management:    Induction: Intravenous  Airway Management Planned: Oral ETT  Additional Equipment:   Intra-op Plan:   Post-operative Plan: Extubation in OR  Informed Consent: I have reviewed the patients History and Physical, chart, labs and discussed the procedure including the risks, benefits and alternatives for the proposed anesthesia with the patient or authorized representative who has indicated his/her understanding and acceptance.   Dental advisory given  Plan Discussed with: CRNA and Surgeon  Anesthesia Plan Comments: (Plan routine monitors, GETA Pt accepts PON/V prophylaxis with Zofran (says she was nauseated when she took it before), and decadron, scop patch)        Anesthesia Quick Evaluation

## 2016-03-22 NOTE — Transfer of Care (Signed)
Immediate Anesthesia Transfer of Care Note  Patient: Cassandra PETTAWAY  Procedure(s) Performed: Procedure(s): LAPAROSCOPIC GASTRIC SLEEVE RESECTION WITH HIATAL HERNIA REPAIR WITHUPPER ENDOSCOPY (N/A)  Patient Location: PACU  Anesthesia Type:General  Level of Consciousness: awake, alert , oriented and patient cooperative  Airway & Oxygen Therapy: Patient Spontanous Breathing and Patient connected to face mask oxygen  Post-op Assessment: Report given to RN and Post -op Vital signs reviewed and stable  Post vital signs: Reviewed and stable  Last Vitals:  Vitals:   03/22/16 0935  BP: (!) 142/82  Pulse: 73  Resp: 18  Temp: 36.6 C    Last Pain:  Vitals:   03/22/16 0935  TempSrc: Oral      Patients Stated Pain Goal: 4 (0000000 [id number])  Complications: No apparent anesthesia complications

## 2016-03-22 NOTE — Anesthesia Postprocedure Evaluation (Signed)
Anesthesia Post Note  Patient: Cassandra Mcconnell  Procedure(s) Performed: Procedure(s) (LRB): LAPAROSCOPIC GASTRIC SLEEVE RESECTION WITH HIATAL HERNIA REPAIR WITHUPPER ENDOSCOPY (N/A)  Patient location during evaluation: PACU Anesthesia Type: General Level of consciousness: awake and alert, oriented and patient cooperative Pain management: pain level controlled Vital Signs Assessment: post-procedure vital signs reviewed and stable Respiratory status: spontaneous breathing, nonlabored ventilation, respiratory function stable and patient connected to nasal cannula oxygen Cardiovascular status: blood pressure returned to baseline and stable Postop Assessment: no signs of nausea or vomiting Anesthetic complications: no    Last Vitals:  Vitals:   03/22/16 1600 03/22/16 1625  BP: 133/86 133/72  Pulse: 71 69  Resp: (!) 22 16  Temp:  36.5 C    Last Pain:  Vitals:   03/22/16 1600  TempSrc:   PainSc: 0-No pain                 Juan Kissoon,E. Delaney Schnick

## 2016-03-22 NOTE — Anesthesia Procedure Notes (Signed)
Procedure Name: Intubation Date/Time: 03/22/2016 12:27 PM Performed by: Dione Booze Pre-anesthesia Checklist: Emergency Drugs available, Suction available, Patient being monitored and Patient identified Patient Re-evaluated:Patient Re-evaluated prior to inductionOxygen Delivery Method: Circle system utilized Preoxygenation: Pre-oxygenation with 100% oxygen Intubation Type: IV induction Laryngoscope Size: Mac and 4 Grade View: Grade I Tube type: Oral Tube size: 7.5 mm Number of attempts: 1 Airway Equipment and Method: Stylet Placement Confirmation: ETT inserted through vocal cords under direct vision,  positive ETCO2 and breath sounds checked- equal and bilateral Secured at: 21 cm Tube secured with: Tape Dental Injury: Teeth and Oropharynx as per pre-operative assessment

## 2016-03-22 NOTE — Discharge Instructions (Addendum)
Aprepitant Discharge Instructions  On the day of surgery you were given the medication aprepitant. This medication interacts with hormonal forms of birth control (oral contraceptives and injected or implanted birth control) and may make them ineffective. IF YOU USE ANY HORMONAL FORM OF BIRTH CONTROL, YOU MUST USE AN ADDITIONAL BARRIER BIRTH CONTROL METHOD FOR ONE MONTH after receiving aprepitant or there is a chance you could become pregnant.    [facility] [place] [facility] - DISCHARGE INSTRUCTIONS TO PATIENT  Activity:  Driving - May drive in 2 to 4 days, if doing well and off pain meds.   Lifting - No lifting more than 15 pounds for 1 week, then no limit  Wound Care:   May shower starting tomorrow.  Diet:  Post gastric sleeve diet  Follow up appointment:  Call Dr. Pollie Friar office [facility]) at [phone] for an appointment in 2 to 3 weeks.  Medications and dosages:  Resume your home medications.  You have a prescription for:  Pain and nausea at home.  Call Dr. Lucia Gaskins or his office  [phone]) if you have:  Temperature greater than 100.4,  Persistent nausea and vomiting,  Severe uncontrolled pain,  Redness, tenderness, or signs of infection (pain, swelling, redness, odor or green/yellow discharge around the site),  Difficulty breathing, headache or visual disturbances,  Any other questions or concerns you may have after discharge.  In an emergency, call 911 or go to an Emergency Department at a nearby hospital.       GASTRIC BYPASS/SLEEVE  Home Care Instructions   These instructions are to help you care for yourself when you go home.  Call: If you have any problems.  Call [phone] and ask for the surgeon on call  If you need immediate assistance come to the ER at [facility]. Tell the ER staff you are a new post-op gastric bypass or gastric sleeve patient  Signs and symptoms to report:  Severe  vomiting or nausea o If you cannot handle clear  liquids for longer than 1 day, call your surgeon  Abdominal pain which does not get better after taking your pain medication  Fever greater than 100.4  F and chills  Heart rate over 100 beats a minute  Trouble breathing  Chest pain  Redness,  swelling, drainage, or foul odor at incision (surgical) sites  If your incisions open or pull apart  Swelling or pain in calf (lower leg)  Diarrhea (Loose bowel movements that happen often), frequent watery, uncontrolled bowel movements  Constipation, (no bowel movements for 3 days) if this happens: o Take Milk of Magnesia, 2 tablespoons by mouth, 3 times a day for 2 days if needed o Stop taking Milk of Magnesia once you have had a bowel movement o Call your doctor if constipation continues Or o Take Miralax  (instead of Milk of Magnesia) following the label instructions o Stop taking Miralax once you have had a bowel movement o Call your doctor if constipation continues  Anything you think is abnormal for you   Normal side effects after surgery:  Unable to sleep at night or unable to concentrate  Irritability  Being tearful (crying) or depressed  These are common complaints, possibly related to your anesthesia, stress of surgery, and change in lifestyle, that usually go away a few weeks after surgery. If these feelings continue, call your medical doctor.  Wound Care: You may have surgical glue, steri-strips, or staples over your incisions after surgery  Surgical glue: Looks like clear film  over your incisions and will wear off a little at a time  Steri-strips: Adhesive strips of tape over your incisions. You may notice a yellowish color on skin under the steri-strips. This is used to make the steri-strips stick better. Do not pull the steri-strips off - let them fall off  Staples: Staples may be removed before you leave the hospital o If you go home with staples, call [facility] for an appointment with your  surgeons nurse to have staples removed 10 days after surgery, [phone]  Showering: You may shower two (2) days after your surgery unless your surgeon tells you differently o Wash gently around incisions with warm soapy water, rinse well, and gently pat dry o If you have a drain (tube from your incision), you may need someone to hold this while you shower o No tub baths until staples are removed and incisions are healed   Medications:  Medications should be liquid or crushed if larger than the size of a dime  Extended release pills (medication that releases a little bit at a time through the  day) should not be crushed  Depending on the size and number of medications you take, you may need to space (take a few throughout the day)/change the time you take your medications so that you do not over-fill your pouch (smaller stomach)  Make sure you follow-up with you primary care physician to make medication changes needed during rapid weight loss and life -style changes  If you have diabetes, follow up with your doctor that orders your diabetes medication(s) within one week after surgery and check your blood sugar regularly   Do not drive while taking narcotics (pain medications)   Do not take acetaminophen (Tylenol) and Roxicet or Lortab Elixir at the same time since these pain medications contain acetaminophen   Diet:  First 2 Weeks You will see the nutritionist about two (2) weeks after your surgery. The nutritionist will increase the types of foods you can eat if you are handling liquids well:  If you have severe vomiting or nausea and cannot handle clear liquids lasting longer than 1 day call your surgeon Protein Shake  Drink at least 2 ounces of shake 5-6 times per day  Each serving of protein shakes (usually 8-12 ounces) should have a minimum of: o 15 grams of protein o And no more than 5 grams of carbohydrate  Goal for protein each day: o Men = 80 grams per day o Women  = 60 grams per day     Protein powder may be added to fluids such as non-fat milk or Lactaid milk or Soy milk (limit to 35 grams added protein powder per serving)  Hydration  Slowly increase the amount of water and other clear liquids as tolerated (See Acceptable Fluids)  Slowly increase the amount of protein shake as tolerated  Sip fluids slowly and throughout the day  May use sugar substitutes in small amounts (no more than 6-8 packets per day; i.e. Splenda)  Fluid Goal  The first goal is to drink at least 8 ounces of protein shake/drink per day (or as directed by the nutritionist); some examples of protein shakes are [employer], [employer]R [employer], EAS Edge HP, and Unjury. - See handout from pre-op Bariatric Education Class: o Slowly increase the amount of protein shake you drink as tolerated o You may find it easier to slowly sip shakes throughout the day o It is important to get your proteins in first  Your fluid goal is to drink 64-100 ounces of fluid daily o It may take a few weeks to build up to this   32 oz. (or more) should be clear liquids And  32 oz. (or more) should be full liquids (see below for examples)  Liquids should not contain sugar, caffeine, or carbonation  Clear Liquids:  Water of Sugar-free flavored water (i.e. Fruit HO, Propel)  Decaffeinated coffee or tea (sugar-free)  Crystal lite, Wylers Lite, Minute Maid Lite  Sugar-free Jell-O  Bouillon or broth  Sugar-free Popsicle:    - Less than 20 calories each; Limit 1 per day  Full Liquids:                   Protein Shakes/Drinks + 2 choices per day of other full liquids  Full liquids must be: o No More Than 12 grams of Carbs per serving o No More Than 3 grams of Fat per serving  Strained low-fat cream soup  Non-Fat milk  Fat-free Lactaid Milk  Sugar-free yogurt (Dannon Lite & Fit, Greek yogurt)    Vitamins and Minerals  Start 1 day after surgery unless otherwise directed by your  surgeon  2 Chewable Multivitamin / Multimineral Supplement with iron (i.e. Centrum for Adults)  Vitamin B-12, 350-500 micrograms sub-lingual (place tablet under the tongue) each day  Chewable Calcium Citrate with Vitamin D-3 (Example: 3 Chewable Calcium  Plus 600 with Vitamin D-3) o Take 500 mg three (3) times a day for a total of 1500 mg each day o Do not take all 3 doses of calcium at one time as it may cause constipation, and you can only absorb 500 mg at a time o Do not mix multivitamins containing iron with calcium supplements;  take 2 hours apart o Do not substitute Tums (calcium carbonate) for your calcium  Menstruating women and those at risk for anemia ( a blood disease that causes weakness) may need extra iron o Talk to your doctor to see if you need more iron  If you need extra iron: Total daily Iron recommendation (including Vitamins) is 50 to 100 mg Iron/day  Do not stop taking or change any vitamins or minerals until you talk to your nutritionist or surgeon  Your nutritionist and/or surgeon must approve all vitamin and mineral supplements   Activity and Exercise: It is important to continue walking at home. Limit your physical activity as instructed by your doctor. During this time, use these guidelines:  Do not lift anything greater than ten  (10) pounds for at least two (2) weeks  Do not go back to work or drive until Engineer, production says you can  You may have sex when you feel comfortable o It is VERY important for female patients to use a reliable birth control method; fertility often increase after surgery o Do not get pregnant for at least 18 months  Start exercising as soon as your doctor tells you that you can o Make sure your doctor approves any physical activity  Start with a simple walking program  Walk 5-15 minutes each day, 7 days per week  Slowly increase until you are walking 30-45 minutes per day  Consider joining our [place] program. [phone] or  email [email or web address]@[employer] .[email or web address]   Special Instructions Things to remember:  Free counseling is available for you and your family through collaboration between [facility] and [place]. Please call [phone] and leave a message  Use your CPAP when sleeping if this applies to you  Consider buying a medical alert bracelet that says you had lap-band surgery     You will likely have your first fill (fluid added to your band) 6 - 8 weeks after surgery  [facility] has a free Bariatric Surgery Support Group that meets monthly, the 3rd Thursday, [place]. You can see classes online at [email or web address]  It is very important to keep all follow up appointments with your surgeon, nutritionist, primary care physician, and behavioral health practitioner o After the first year, please follow up with your bariatric surgeon and nutritionist at least once a year in order to maintain best weight loss results                    [facility]:  [place]: [phone]               Bariatric Nurse Coordinator: [phone]  Gastric Bypass/Sleeve Home Care Instructions  Rev. 05/2012                                                         Reviewed and Endorsed                                                    by [facility] Patient Education Committee, Jan, 2014

## 2016-03-22 NOTE — Interval H&P Note (Signed)
History and Physical Interval Note:  03/22/2016 11:53 AM  Cassandra Mcconnell  has presented today for surgery, with the diagnosis of MORBID OBESITY  The various methods of treatment have been discussed with the patient and family.  Her husband is to be here.  Her allergy to penicillin was in the 1980's and was a lump in her throat.  After consideration of risks, benefits and other options for treatment, the patient has consented to  Procedure(s): LAPAROSCOPIC GASTRIC SLEEVE RESECTION WITH HIATAL HERNIA REPAIR WITHUPPER ENDOSCOPY (N/A) as a surgical intervention .  The patient's history has been reviewed, patient examined, no change in status, stable for surgery.  I have reviewed the patient's chart and labs.  Questions were answered to the patient's satisfaction.     Elanie Hammitt H

## 2016-03-22 NOTE — Anesthesia Procedure Notes (Signed)
Procedures

## 2016-03-22 NOTE — Op Note (Signed)
Preoperative diagnosis: laparoscopic sleeve gastrectomy  Postoperative diagnosis: Same   Procedure: Upper endoscopy   Surgeon: Clovis Riley, M.D.  Anesthesia: Gen.   Indications for procedure: This patient was undergoing a laparoscopic sleeve gastrectomy.   Description of procedure: The endoscopy was placed in the mouth and into the oropharynx and under endoscopic vision it was advanced to the esophagogastric junction. The pouch was insufflated and no bleeding or bubbles were seen. The GEJ was identified at 35cm from the teeth. The angularis incisura was widely patent. No bleeding or leaks were detected. The scope was withdrawn without difficulty.   Clovis Riley, M.D. General, Bariatric, & Minimally Invasive Surgery [facility]

## 2016-03-23 LAB — CBC WITH DIFFERENTIAL/PLATELET
BASOS PCT: 0 %
Basophils Absolute: 0 10*3/uL (ref 0.0–0.1)
Eosinophils Absolute: 0 10*3/uL (ref 0.0–0.7)
Eosinophils Relative: 0 %
HEMATOCRIT: 39.6 % (ref 36.0–46.0)
HEMOGLOBIN: 12.5 g/dL (ref 12.0–15.0)
LYMPHS ABS: 0.9 10*3/uL (ref 0.7–4.0)
LYMPHS PCT: 7 %
MCH: 27.7 pg (ref 26.0–34.0)
MCHC: 31.6 g/dL (ref 30.0–36.0)
MCV: 87.6 fL (ref 78.0–100.0)
MONO ABS: 0.9 10*3/uL (ref 0.1–1.0)
MONOS PCT: 7 %
NEUTROS ABS: 11.2 10*3/uL — AB (ref 1.7–7.7)
NEUTROS PCT: 86 %
Platelets: 267 10*3/uL (ref 150–400)
RBC: 4.52 MIL/uL (ref 3.87–5.11)
RDW: 15.2 % (ref 11.5–15.5)
WBC: 13 10*3/uL — ABNORMAL HIGH (ref 4.0–10.5)

## 2016-03-23 MED ORDER — LIP MEDEX EX OINT
TOPICAL_OINTMENT | CUTANEOUS | Status: AC
  Administered 2016-03-23: 10:00:00
  Filled 2016-03-23: qty 7

## 2016-03-23 NOTE — Discharge Summary (Signed)
Physician Discharge Summary  Patient ID:  Cassandra Mcconnell  MRN: [id number]  DOB/AGE: [birth year] 50 y.o.  Admit date: 03/22/2016 Discharge date: 03/23/2016  Discharge Diagnoses:  1. MORBID OBESITY WITH BMI OF 40.0-44.9, ADULT (E66.01)  2.  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 HTN 5. History of bilateral salpingo oophorectomy  Dr. Marciano Sequin Linna Hoff - Nov 2016. She did well from this surgery and sees this as to how she will do with weight loss surgery. 6. History of diverticulitis - June 2016  She has never had a colonoscopy 7. Depression 8. Breast reduction - 07/2014 - Dr. Iran Planas 9. Chest pain seoncdary to anxiety 10. Back trouble      L5-S1 - G. Cram - 2004 11. On disability since 2004 for her back   Active Problems:   Morbid obesity ([place])  Operation: Procedure(s): LAPAROSCOPIC GASTRIC SLEEVE RESECTION WITH HIATAL HERNIA REPAIR WITHUPPER ENDOSCOPY on 03/22/2016 - D. [facility]  Discharged Condition: good  Hospital Course: LENIX MCCORMAC is an 50 y.o. female whose primary care physician is Arnette Norris, MD and who was admitted 03/22/2016 with a chief complaint of Morbid Obesity.  She was brought to the operating room on 03/22/2016 and underwent  LAPAROSCOPIC GASTRIC SLEEVE RESECTION WITH HIATAL HERNIA REPAIR WITHUPPER ENDOSCOPY.   She was started on water last night and has tolerated this well.  She will progress to her protein shakes today.  If these are tolerated, will go home later today.  The discharge instructions were reviewed with the patient.  Consults: None  Significant Diagnostic Studies: Results for orders placed or performed during the hospital encounter of 03/22/16  Hemoglobin and hematocrit, blood  Result Value Ref Range   Hemoglobin 13.1 12.0 - 15.0 g/dL   HCT 40.0 36.0 - 46.0 %  CBC WITH DIFFERENTIAL  Result Value Ref Range   WBC 13.0 (H) 4.0 - 10.5 K/uL   RBC 4.52 3.87 - 5.11 MIL/uL   Hemoglobin 12.5  12.0 - 15.0 g/dL   HCT 39.6 36.0 - 46.0 %   MCV 87.6 78.0 - 100.0 fL   MCH 27.7 26.0 - 34.0 pg   MCHC 31.6 30.0 - 36.0 g/dL   RDW 15.2 11.5 - 15.5 %   Platelets 267 150 - 400 K/uL   Neutrophils Relative % 86 %   Neutro Abs 11.2 (H) 1.7 - 7.7 K/uL   Lymphocytes Relative 7 %   Lymphs Abs 0.9 0.7 - 4.0 K/uL   Monocytes Relative 7 %   Monocytes Absolute 0.9 0.1 - 1.0 K/uL   Eosinophils Relative 0 %   Eosinophils Absolute 0.0 0.0 - 0.7 K/uL   Basophils Relative 0 %   Basophils Absolute 0.0 0.0 - 0.1 K/uL    Dg Chest 2 View  Result Date: 02/26/2016 CLINICAL DATA:  Preoperative evaluation. Hypertension. Morbid obesity. EXAM: CHEST  2 VIEW COMPARISON:  August 03, 2006. FINDINGS: The lungs are clear. Heart size and pulmonary vascularity are normal. No adenopathy. There is an azygos lobe on the right, an anatomic variant. No bone lesions. IMPRESSION: No edema or consolidation. Electronically Signed   By: Lowella Grip III M.D.   On: 02/26/2016 16:13    Discharge Exam:  Vitals:   03/23/16 0108 03/23/16 0515  BP: 134/64 132/60  Pulse: 67 65  Resp: 16 15  Temp: 97.6 F (36.4 C) 97.9 F (36.6 C)    General: Obese WF who is alert and generally healthy appearing.  Lungs: Clear to  auscultation and symmetric breath sounds.  IS = 1,200 cc Heart:  RRR. No murmur or rub. Abdomen: Soft.  No hernia. Normal bowel sounds.  Incisions look good.  Discharge Medications:     Medication List    TAKE these medications   acetaminophen 500 MG tablet Commonly known as:  TYLENOL Take 1,000 mg by mouth every 6 (six) hours as needed. For pain   citalopram 20 MG tablet Commonly known as:  CELEXA Take 1 tablet (20 mg total) by mouth 2 (two) times daily.   docusate sodium 100 MG capsule Commonly known as:  COLACE Take 100 mg by mouth daily as needed for mild constipation.   multivitamin tablet Take 1 tablet by mouth daily.   REFRESH OP Apply 1 drop to eye daily as needed (dry eyes).    triamcinolone cream 0.1 % Commonly known as:  KENALOG APPLY TO AFFECTED AREA TWICE DAILY.       Disposition: 01-Home or Self Care  Discharge Instructions    Ambulate hourly while awake    Complete by:  As directed    Call MD for:  difficulty breathing, headache or visual disturbances    Complete by:  As directed    Call MD for:  persistant dizziness or light-headedness    Complete by:  As directed    Call MD for:  persistant nausea and vomiting    Complete by:  As directed    Call MD for:  redness, tenderness, or signs of infection (pain, swelling, redness, odor or green/yellow discharge around incision site)    Complete by:  As directed    Call MD for:  severe uncontrolled pain    Complete by:  As directed    Call MD for:  temperature >101 F    Complete by:  As directed    Diet bariatric full liquid    Complete by:  As directed    Incentive spirometry    Complete by:  As directed    Perform hourly while awake       Activity:  Driving - May drive in 2 to 4 days, if doing well and off pain meds.   Lifting - No lifting more than 15 pounds for 1 week, then no limit  Wound Care:   May shower starting tomorrow.  Diet:  Post gastric sleeve diet  Follow up appointment:  Call Dr. Pollie Friar office [facility]) at [phone] for an appointment in 2 to 3 weeks.  Medications and dosages:  Resume your home medications.  You have a prescription for:  Pain and nausea at home.   Signed: Alphonsa Overall, M.D., [facility] Office:  [phone]  03/23/2016, 7:26 AM

## 2016-03-23 NOTE — Progress Notes (Signed)
Patient ready for discharge but not yet seen by dietician. Vilinda Flake -bariatric nurse states she discussed diet in detail and patient may be released without seeing dietician.  Patient tolerating protein shakes with out nausea. Voiding qs and ambulating without difficulty.

## 2016-03-23 NOTE — Progress Notes (Signed)
Patient alert and oriented, pain is controlled. Patient is tolerating fluids, advanced to protein shake today, patient is tolerating well.  Reviewed Gastric sleeve discharge instructions with patient and patient is able to articulate understanding.  Provided information on BELT program, Support Group and [facility] outpatient pharmacy. All questions answered, will continue to monitor.  

## 2016-04-05 ENCOUNTER — Ambulatory Visit: Payer: Self-pay | Admitting: Dietician

## 2016-04-13 ENCOUNTER — Encounter: Payer: Commercial Managed Care - HMO | Attending: Surgery | Admitting: Dietician

## 2016-04-13 DIAGNOSIS — Z713 Dietary counseling and surveillance: Secondary | ICD-10-CM | POA: Insufficient documentation

## 2016-04-13 DIAGNOSIS — Z6841 Body Mass Index (BMI) 40.0 and over, adult: Secondary | ICD-10-CM | POA: Insufficient documentation

## 2016-04-13 NOTE — Patient Instructions (Addendum)
Goals:  Follow Phase 3A: Soft High Protein Phase  Eat 3-6 small meals/snacks, every 3-5 hrs  Increase lean protein foods to meet 60g goal  Increase fluid intake to 64oz +  Avoid drinking 15 minutes before, during and 30 minutes after eating  Aim for >30 min of physical activity daily per MD  Consider downloading Baritastic App or using calendar in phone to remind to drink  Surgery date: 03/22/2016 Surgery type: Sleeve gastrectomy Start weight at [facility]: 246 lbs on 01/08/2016  Weight today: 225.6 lbs Weight change: 20.4 lbs  TANITA  BODY COMP RESULTS  01/18/16 04/13/16   BMI (kg/m^2) N/A 38.7   Fat Mass (lbs)  118.8   Fat Free Mass (lbs)  106.8   Total Body Water (lbs)  77.6
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
## 2016-04-19 ENCOUNTER — Other Ambulatory Visit: Payer: Self-pay | Admitting: Family Medicine

## 2016-04-20 ENCOUNTER — Telehealth: Payer: Self-pay | Admitting: Skilled Nursing Facility1

## 2016-04-20 NOTE — Telephone Encounter (Signed)
Pt called seeking Lajean Saver MS, RD, LDN  or Roxan Hockey MS, RD, LDN both of which were unavailable so this Dietitian took the call. Pt states her Hair is falling out in clumps: last week and this weekend losing more hair. Hx of sleeve gastrectomy 03/22/2016.Pt states she is feeling nauseated and stomach aches and pains, shaky and weak, cant barly stomach protein shakes, been eating eggs, beans, [place], ground beef without issue, 24 hours to get in 48 fluid ounces, started drinking yogurt fruit smoothies. Pt states she is under a lot of stress with a special needs daughter.Pt states she is taking a bariatric specific multivitamin and calcium every day. Pt states she does not have an appointment with Doctor Lucia Gaskins until January 18th after missing her original appointment with him.  Dietitian advised she stop the fruit smoothies, continue to aim for more fluid and protein every day. Also advised she call her surgeons office if these symptoms persist.

## 2016-04-21 ENCOUNTER — Observation Stay (HOSPITAL_COMMUNITY)
Admission: EM | Admit: 2016-04-21 | Discharge: 2016-04-21 | Discharge status: Against Medical Advice | Payer: Commercial Managed Care - HMO | Attending: Emergency Medicine | Admitting: Emergency Medicine

## 2016-04-21 ENCOUNTER — Encounter (HOSPITAL_COMMUNITY): Payer: Self-pay | Admitting: Emergency Medicine

## 2016-04-21 ENCOUNTER — Observation Stay (HOSPITAL_COMMUNITY): Payer: Commercial Managed Care - HMO

## 2016-04-21 DIAGNOSIS — F329 Major depressive disorder, single episode, unspecified: Secondary | ICD-10-CM | POA: Diagnosis not present

## 2016-04-21 DIAGNOSIS — Z8719 Personal history of other diseases of the digestive system: Secondary | ICD-10-CM

## 2016-04-21 DIAGNOSIS — Z6838 Body mass index (BMI) 38.0-38.9, adult: Secondary | ICD-10-CM | POA: Diagnosis not present

## 2016-04-21 DIAGNOSIS — Z9884 Bariatric surgery status: Secondary | ICD-10-CM | POA: Diagnosis not present

## 2016-04-21 DIAGNOSIS — Z79891 Long term (current) use of opiate analgesic: Secondary | ICD-10-CM | POA: Insufficient documentation

## 2016-04-21 DIAGNOSIS — R109 Unspecified abdominal pain: Secondary | ICD-10-CM | POA: Diagnosis not present

## 2016-04-21 DIAGNOSIS — R110 Nausea: Secondary | ICD-10-CM

## 2016-04-21 DIAGNOSIS — R1110 Vomiting, unspecified: Secondary | ICD-10-CM | POA: Diagnosis not present

## 2016-04-21 DIAGNOSIS — I10 Essential (primary) hypertension: Secondary | ICD-10-CM | POA: Diagnosis not present

## 2016-04-21 DIAGNOSIS — Z981 Arthrodesis status: Secondary | ICD-10-CM | POA: Diagnosis not present

## 2016-04-21 DIAGNOSIS — Z79899 Other long term (current) drug therapy: Secondary | ICD-10-CM | POA: Insufficient documentation

## 2016-04-21 DIAGNOSIS — R112 Nausea with vomiting, unspecified: Secondary | ICD-10-CM | POA: Diagnosis not present

## 2016-04-21 DIAGNOSIS — K219 Gastro-esophageal reflux disease without esophagitis: Secondary | ICD-10-CM | POA: Diagnosis not present

## 2016-04-21 DIAGNOSIS — Z9889 Other specified postprocedural states: Secondary | ICD-10-CM

## 2016-04-21 HISTORY — DX: Nausea: R11.0

## 2016-04-21 HISTORY — DX: Other specified postprocedural states: Z98.890

## 2016-04-21 HISTORY — DX: Nausea: Z98.890

## 2016-04-21 LAB — CBC
HCT: 43.8 % (ref 36.0–46.0)
Hemoglobin: 14.5 g/dL (ref 12.0–15.0)
MCH: 27.6 pg (ref 26.0–34.0)
MCHC: 33.1 g/dL (ref 30.0–36.0)
MCV: 83.3 fL (ref 78.0–100.0)
Platelets: 250 10*3/uL (ref 150–400)
RBC: 5.26 MIL/uL — AB (ref 3.87–5.11)
RDW: 15.6 % — ABNORMAL HIGH (ref 11.5–15.5)
WBC: 8.8 10*3/uL (ref 4.0–10.5)

## 2016-04-21 LAB — CBG MONITORING, ED: Glucose-Capillary: 92 mg/dL (ref 65–99)

## 2016-04-21 LAB — COMPREHENSIVE METABOLIC PANEL
ALT: 48 U/L (ref 14–54)
ANION GAP: 10 (ref 5–15)
AST: 37 U/L (ref 15–41)
Albumin: 4.5 g/dL (ref 3.5–5.0)
Alkaline Phosphatase: 87 U/L (ref 38–126)
BUN: 14 mg/dL (ref 6–20)
CHLORIDE: 108 mmol/L (ref 101–111)
CO2: 23 mmol/L (ref 22–32)
Calcium: 9.7 mg/dL (ref 8.9–10.3)
Creatinine, Ser: 0.74 mg/dL (ref 0.44–1.00)
GFR calc non Af Amer: >60 mL/min (ref >60)
Glucose, Bld: 96 mg/dL (ref 65–99)
Potassium: 3.6 mmol/L (ref 3.5–5.1)
SODIUM: 141 mmol/L (ref 135–145)
Total Bilirubin: 1.1 mg/dL (ref 0.3–1.2)
Total Protein: 7.5 g/dL (ref 6.5–8.1)

## 2016-04-21 LAB — URINALYSIS, ROUTINE W REFLEX MICROSCOPIC
BILIRUBIN URINE: NEGATIVE
Glucose, UA: NEGATIVE mg/dL
HGB URINE DIPSTICK: NEGATIVE
KETONES UR: 20 mg/dL — AB
Nitrite: NEGATIVE
PH: 5 (ref 5.0–8.0)
Protein, ur: 30 mg/dL — AB
Specific Gravity, Urine: 1.027 (ref 1.005–1.030)

## 2016-04-21 LAB — LIPASE, BLOOD: LIPASE: 28 U/L (ref 11–51)

## 2016-04-21 MED ORDER — SODIUM CHLORIDE 0.45 % IV SOLN: INTRAVENOUS | Status: DC

## 2016-04-21 MED ORDER — METOPROLOL TARTRATE 25 MG PO TABS: 25.0000 mg | ORAL_TABLET | Freq: Two times a day (BID) | ORAL | Status: DC

## 2016-04-21 MED ORDER — HEPARIN SODIUM (PORCINE) 5000 UNIT/ML IJ SOLN: 5000.0000 [IU] | Freq: Three times a day (TID) | INTRAMUSCULAR | Status: DC

## 2016-04-21 MED ORDER — DOCUSATE SODIUM 100 MG PO CAPS: 100.0000 mg | ORAL_CAPSULE | Freq: Every day | ORAL | Status: DC | PRN

## 2016-04-21 MED ORDER — PANTOPRAZOLE SODIUM 40 MG IV SOLR: 40.0000 mg | Freq: Every day | INTRAVENOUS | Status: DC

## 2016-04-21 MED ORDER — BISACODYL 10 MG RE SUPP: 10.0000 mg | Freq: Every day | RECTAL | Status: DC | PRN

## 2016-04-21 MED ORDER — CITALOPRAM HYDROBROMIDE 10 MG PO TABS: 20.0000 mg | ORAL_TABLET | Freq: Two times a day (BID) | ORAL | Status: DC

## 2016-04-21 MED ORDER — MORPHINE SULFATE (PF) 2 MG/ML IV SOLN: 1.0000 mg | INTRAVENOUS | Status: DC | PRN

## 2016-04-21 MED ORDER — IOPAMIDOL (ISOVUE-300) INJECTION 61%
100.0000 mL | Freq: Once | INTRAVENOUS | Status: AC | PRN
  Administered 2016-04-21: 100 mL via INTRAVENOUS

## 2016-04-21 MED ORDER — IOPAMIDOL (ISOVUE-300) INJECTION 61%
INTRAVENOUS | Status: AC
  Filled 2016-04-21: qty 100

## 2016-04-21 MED ORDER — PROCHLORPERAZINE EDISYLATE 5 MG/ML IJ SOLN: 10.0000 mg | Freq: Four times a day (QID) | INTRAMUSCULAR | Status: DC | PRN

## 2016-04-21 MED ORDER — HYDRALAZINE HCL 20 MG/ML IJ SOLN: 10.0000 mg | INTRAMUSCULAR | Status: DC | PRN

## 2016-04-21 MED ORDER — PROMETHAZINE HCL 25 MG/ML IJ SOLN
25.0000 mg | Freq: Once | INTRAMUSCULAR | Status: DC
  Filled 2016-04-21: qty 1

## 2016-04-21 MED ORDER — IOPAMIDOL (ISOVUE-300) INJECTION 61%
30.0000 mL | Freq: Once | INTRAVENOUS | Status: AC | PRN
  Administered 2016-04-21: 30 mL via ORAL

## 2016-04-21 MED ORDER — METOPROLOL TARTRATE 5 MG/5ML IV SOLN: 10.0000 mg | Freq: Two times a day (BID) | INTRAVENOUS | Status: DC

## 2016-04-21 MED ORDER — IOPAMIDOL (ISOVUE-300) INJECTION 61%
INTRAVENOUS | Status: AC
  Filled 2016-04-21: qty 30

## 2016-04-21 MED ORDER — OXYCODONE HCL 5 MG/5ML PO SOLN: 5.0000 mg | ORAL | Status: DC | PRN

## 2016-04-21 MED ORDER — SODIUM CHLORIDE 0.9 % IV BOLUS (SEPSIS): 1000.0000 mL | Freq: Once | INTRAVENOUS | Status: DC

## 2016-04-21 NOTE — ED Notes (Signed)
Report called to Elnita Maxwell on 5E. Pt can go to unit following CT

## 2016-04-21 NOTE — ED Notes (Addendum)
Two RNs have attempted to start IV on pt. IV team consult made. This RN offered to have medications changed to different medications or different route. Pt declined

## 2016-04-21 NOTE — ED Notes (Signed)
Per admitting MD contrast for CT should be IV and oral

## 2016-04-21 NOTE — ED Notes (Signed)
Pt refusing all medications at this time. IV team started a 24 g IV. CT notified

## 2016-04-21 NOTE — ED Notes (Signed)
Offered patient zofran. However, patient states she is allergic. Patient states she has taken phenergan with minimal relief.

## 2016-04-21 NOTE — ED Provider Notes (Signed)
Please see previous note for full H&P. Patient was seen by surgery for observation due to persistent nausea and inability to tolerate by mouth. CT scan was ordered, prior to CT scan patient reports that she would stay for CT scan did not want to be admitted to the hospital. She is tolerating by mouth both liquid and solids, no significant nausea. CT scan returned showing no significant abnormalities. Patient adamantly requesting discharge home, refusing hospital stay.  Patient is very well-appearing with reassuring laboratory results, and reassuring CT, and reassuring vital signs. Surgery will be consulted to inform them that patient does not want to be admitted, she will be discharged home with outpatient follow-up.  Attempted to consult surgery, patient left AMA prior to surgery consult. She appears stable, no acute findings in today's workup.  Vitals:   04/21/16 1432 04/21/16 1849  BP: 142/89 134/59  Pulse: 73 64  Resp: 16 18  Temp:        Okey Regal, PA-C 04/22/16 0047    Carmin Muskrat, MD 04/22/16 [phone]

## 2016-04-21 NOTE — ED Notes (Signed)
Pt was informed that she needed to be discharged by her surgeon, but she had the option of leaving AMA, but she would need to sign a form stating she was leaving prior to being medically cleared. Pt explained the risks of leaving prior to being cleared. Pt consented. When this RN left the room pt attempted to leave and refused to stop. After being asked multiple times to stop to sign stating she was leaving against medical advise, pt stopped and signed

## 2016-04-21 NOTE — ED Notes (Signed)
Unsuccessful IV attempt.  Pt would not allow this writer to even at L arm or R AC area.  Primary RN notified.  Will consult IV team.

## 2016-04-21 NOTE — ED Provider Notes (Signed)
[place] DEPT Provider Note   CSN: [id number] Arrival date & time: 04/21/16  1137     History   Chief Complaint Chief Complaint  Patient presents with  . Abdominal Pain  . Nausea    HPI Cassandra Mcconnell is a 50 y.o. female.  HPI   50 year old female presents today with complaints of abdominal pain. Patient is 4 weeks status post laparoscopic sleeve gastrectomy and repair of hiatal hernia performed by Dr. Lucia Gaskins. Patient notes since that time she has had centralized abdominal pain, she describes this as a constant ache, worse with palpation. She reports that she has had severe nausea that is worsened over the last several days. Patient notes no vomiting, persistent dry heaves, unable to tolerate both solid and liquid. She reports normal bowel movements, denies diarrhea or fever. Patient notes trying promethazine at home with no significant improvement in her symptoms.     Past Medical History:  Diagnosis Date  . Anxiety   . Back pain   . Bell's palsy    Left side, 2009, 2017  . Depression   . Diverticulitis   . GERD (gastroesophageal reflux disease)   . History of hiatal hernia   . Hyperlipidemia   . Hypertension   . Postoperative nausea 04/21/2016  . Wears glasses     Patient Active Problem List   Diagnosis Date Noted  . Postoperative nausea 04/21/2016  . Onychomycosis 10/15/2015  . Bell's palsy 09/29/2015  . Cutaneous skin tags 02/18/2015  . Complex ovarian cyst 10/15/2014  . Diverticulitis 10/12/2014  . Severe obesity (BMI >= 40) ([place]) 05/26/2014  . Depression 05/31/2013  . Morbid obesity ([place]) 12/04/2009  . 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 HYPERTROPHY, BREAST 11/20/2006  . Anxiety state 11/10/2006  . GERD 11/10/2006  . HYPERCHOLESTEROLEMIA, PURE 11/09/2006  . HYPERTENSION, BENIGN ESSENTIAL 11/09/2006  . CHOLECYSTECTOMY, HX OF 11/09/2006    Past Surgical History:  Procedure Laterality Date  . BACK SURGERY  2004   l4/5 fusion  .  BREAST REDUCTION SURGERY Bilateral 08/08/2014   Procedure: BILATERAL BREAST REDUCTION  (BREAST);  Surgeon: Irene Limbo, MD;  Location: [facility];  Service: Plastics;  Laterality: Bilateral;  . CHOLECYSTECTOMY     1998  . LAPAROSCOPIC BILATERAL SALPINGO OOPHERECTOMY Bilateral 03/11/2015   Procedure: LAPAROSCOPIC BILATERAL SALPINGO OOPHORECTOMY;  Surgeon: Florian Buff, MD;  Location: [facility];  Service: Gynecology;  Laterality: Bilateral;  . LAPAROSCOPIC GASTRIC SLEEVE RESECTION WITH HIATAL HERNIA REPAIR N/A 03/22/2016   Procedure: LAPAROSCOPIC GASTRIC SLEEVE RESECTION WITH HIATAL HERNIA REPAIR WITHUPPER ENDOSCOPY;  Surgeon: Alphonsa Overall, MD;  Location: [facility];  Service: General;  Laterality: N/A;  . SEPTOPLASTY  1988  . tonsillectomy  1991  . TONSILLECTOMY      OB History    No data available       Home Medications    Prior to Admission medications   Medication Sig Start Date End Date Taking? Authorizing Provider  acetaminophen (TYLENOL) 500 MG tablet Take 1,000 mg by mouth every 6 (six) hours as needed. For pain    Yes Historical Provider, MD  CALCIUM PO Take 1 tablet by mouth daily.   Yes Historical Provider, MD  citalopram (CELEXA) 20 MG tablet Take 1 tablet (20 mg total) by mouth 2 (two) times daily. 12/15/15  Yes Lucille Passy, MD  docusate sodium (COLACE) 100 MG capsule Take 100 mg by mouth daily as needed for mild constipation.   Yes Historical Provider, MD  metoprolol tartrate (LOPRESSOR)  25 MG tablet Take 25 mg by mouth 2 (two) times daily. 03/30/16  Yes Historical Provider, MD  Multiple Vitamin (MULTIVITAMIN) tablet Take 1 tablet by mouth daily.   Yes Historical Provider, MD  oxyCODONE (ROXICODONE) 5 MG/5ML solution Take 5 mLs by mouth 3 (three) times daily. 03/16/16  Yes Historical Provider, MD  pantoprazole (PROTONIX) 40 MG tablet Take 40 mg by mouth daily. 04/19/16  Yes Historical Provider, MD  Polyvinyl Alcohol-Povidone (REFRESH OP) Apply 1 drop to eye  daily as needed (dry eyes).   Yes Historical Provider, MD  Promethazine HCl (PHENERGAN PO) Take 25 mg by mouth every 6 (six) hours as needed (nausea).    Yes Historical Provider, MD  triamcinolone cream (KENALOG) 0.1 % APPLY TO AFFECTED AREA TWICE DAILY. 04/19/16  Yes Lucille Passy, MD    Family History Family History  Problem Relation Age of Onset  . COPD Mother   . Hypertension Mother   . GER disease Mother   . Heart attack Father   . Cancer Father     prostate  . Hyperlipidemia Brother   . Hypertension Brother   . Diabetes Brother   . Neuropathy Brother   . Hyperlipidemia Brother   . Hypertension Brother   . Heart attack Paternal Grandmother   . Heart attack Paternal Grandfather   . Stroke Paternal Grandfather     Social History Social History  Substance Use Topics  . Smoking status: Never Smoker  . Smokeless tobacco: Never Used  . Alcohol use No     Allergies   Penicillins; Zofran [ondansetron hcl]; Azithromycin; Cimetidine; Conjugated estrogens; Cyclobenzaprine hcl; Dicyclomine hcl; Flagyl [metronidazole]; Lisinopril; Norco [hydrocodone-acetaminophen]; Nsaids; Paroxetine; Percocet [oxycodone-acetaminophen]; Rocephin [ceftriaxone]; Rosuvastatin; Sulfa antibiotics; Wellbutrin [bupropion hcl]; Ciprofloxacin; Doxycycline hyclate; Erythromycin base; and Prednisone   Review of Systems Review of Systems  All other systems reviewed and are negative.    Physical Exam Updated Vital Signs BP 142/89 (BP Location: Left Arm)   Pulse 73   Temp 98.3 F (36.8 C)   Resp 16   Ht 5\' 4"  (1.626 m)   Wt 100.2 kg   SpO2 100%   BMI 37.93 kg/m   Physical Exam  Constitutional: She is oriented to person, place, and time. She appears well-developed and well-nourished.  HENT:  Head: Normocephalic and atraumatic.  Eyes: Conjunctivae are normal. Pupils are equal, round, and reactive to light. Right eye exhibits no discharge. Left eye exhibits no discharge. No scleral icterus.  Neck:  Normal range of motion. No JVD present. No tracheal deviation present.  Pulmonary/Chest: Effort normal. No stridor.  Abdominal:  Surgical incision sites clean with no signs of infection, no significant surrounding tenderness- tenderness to palpation of mid abdomen  Neurological: She is alert and oriented to person, place, and time. Coordination normal.  Psychiatric: She has a normal mood and affect. Her behavior is normal. Judgment and thought content normal.  Nursing note and vitals reviewed.    ED Treatments / Results  Labs (all labs ordered are listed, but only abnormal results are displayed) Labs Reviewed  CBC - Abnormal; Notable for the following:       Result Value   RBC 5.26 (*)    RDW 15.6 (*)    All other components within normal limits  LIPASE, BLOOD  COMPREHENSIVE METABOLIC PANEL  URINALYSIS, ROUTINE W REFLEX MICROSCOPIC  CBC  CREATININE, SERUM  CBG MONITORING, ED    EKG  EKG Interpretation None       Radiology No results found.  Procedures Procedures (including critical care time)  Medications Ordered in ED Medications  sodium chloride 0.9 % bolus 1,000 mL (not administered)  promethazine (PHENERGAN) injection 25 mg (not administered)  heparin injection 5,000 Units (not administered)  0.45 % sodium chloride infusion (not administered)  pantoprazole (PROTONIX) injection 40 mg (not administered)  hydrALAZINE (APRESOLINE) injection 10 mg (not administered)  iopamidol (ISOVUE-300) 61 % injection (not administered)  iopamidol (ISOVUE-300) 61 % injection (not administered)  prochlorperazine (COMPAZINE) injection 10 mg (not administered)  bisacodyl (DULCOLAX) suppository 10 mg (not administered)  morphine 2 MG/ML injection 1-2 mg (not administered)  oxyCODONE (ROXICODONE) 5 MG/5ML solution 5 mg (not administered)  metoprolol tartrate (LOPRESSOR) tablet 25 mg (not administered)  docusate sodium (COLACE) capsule 100 mg (not administered)  citalopram (CELEXA)  tablet 20 mg (not administered)     Initial Impression / Assessment and Plan / ED Course  I have reviewed the triage vital signs and the nursing notes.  Pertinent labs & imaging results that were available during my care of the patient were reviewed by me and considered in my medical decision making (see chart for details).  Clinical Course      Final Clinical Impressions(s) / ED Diagnoses   Final diagnoses:  Nausea  Status post laparoscopic sleeve gastrectomy  History of repair of hiatal hernia    Labs:  Imaging:  Consults:  Therapeutics:  Discharge Meds:   Assessment/Plan:  50 year old female presents today with abdominal pain status post laparoscopic sleeve gastrectomy. Patient unable tolerate by mouth, sent to the emergency room by Dr. Lucia Gaskins for consultation here. Patient was seen by mid-level prior to my evaluation, observation orders placed. Patient was given antibiotics and normal saline down here in the ED. Patient's labs reassuring her vital signs are stable.      New Prescriptions New Prescriptions   No medications on file     Okey Regal, PA-C 04/21/16 1629    Okey Regal, PA-C 04/21/16 1932    Margette Fast, MD 04/21/16 2001

## 2016-04-21 NOTE — ED Notes (Signed)
Pt refusing all medications and has stated she will refuse admission. EDP has been notified and surgeon has been paged twice.

## 2016-04-21 NOTE — ED Triage Notes (Signed)
Patient is complaining of abdominal pain and nausea for 3 weeks. Patient has been trying to be seen by Dr. Lucia Gaskins, however, the doctor has not been able to get her in so the patient was sent here. Denies emesis and diarrhea. Patient states she has been unable to keep food/fluids down.

## 2016-04-21 NOTE — Consult Note (Deleted)
[facility] Admission Note  Cassandra Mcconnell 07/04/1965  [id number].    Requesting MD: Long Chief Complaint/Reason for Consult: Nausea  HPI:  Cassandra Mcconnell is a 50yo female 4 weeks s/p Laparoscopic Sleeve gastrectomy (intraoperative upper endoscopy by Dr. Kae Heller) and repair of hiatal hernia 03/22/16 Dr. Lucia Gaskins. States that she has had intermittent central abdominal pain x3 weeks. It has been more severe/constant x2 days. This has been associated with nausea and dry heaving. She has not vomited. Denies constipation, diarrhea, or fevers. Her bowel habits are normal (2-3 BM's per day) and she is passing flatus. She also reports elevated BP, chills, dizziness, and fatigue. States that she has not been able to take anything PO x24 hours. Tried phenergan but this did not help. Patient did enquire if this could be due to stress? States that she has a son at home who is difficult to care for and they have had a lot going on at home, but she did not want to go into detail.  PMH significant for HTN, anxiety Denies home use of anticoagulants Employment: disabled  ROS: Review of Systems  Constitutional: Positive for chills and malaise/fatigue. Negative for fever.  HENT: Negative.   Respiratory: Negative.   Cardiovascular: Negative.   Gastrointestinal: Positive for abdominal pain and nausea. Negative for blood in stool, constipation, diarrhea, heartburn, melena and vomiting.  Genitourinary: Negative.   Skin: Negative.   Psychiatric/Behavioral: The patient is nervous/anxious.      All systems reviewed and otherwise negative except for as above  Family History  Problem Relation Age of Onset  . COPD Mother   . Hypertension Mother   . GER disease Mother   . Heart attack Father   . Cancer Father     prostate  . Hyperlipidemia Brother   . Hypertension Brother   . Diabetes Brother   . Neuropathy Brother   . Hyperlipidemia Brother   . Hypertension Brother   . Heart attack  Paternal Grandmother   . Heart attack Paternal Grandfather   . Stroke Paternal Grandfather     Past Medical History:  Diagnosis Date  . Anxiety   . Back pain   . Bell's palsy    Left side, 2009, 2017  . Depression   . Diverticulitis   . GERD (gastroesophageal reflux disease)   . History of hiatal hernia   . Hyperlipidemia   . Hypertension   . Wears glasses     Past Surgical History:  Procedure Laterality Date  . BACK SURGERY  2004   l4/5 fusion  . BREAST REDUCTION SURGERY Bilateral 08/08/2014   Procedure: BILATERAL BREAST REDUCTION  (BREAST);  Surgeon: Irene Limbo, MD;  Location: [place];  Service: Plastics;  Laterality: Bilateral;  . CHOLECYSTECTOMY     1998  . LAPAROSCOPIC BILATERAL SALPINGO OOPHERECTOMY Bilateral 03/11/2015   Procedure: LAPAROSCOPIC BILATERAL SALPINGO OOPHORECTOMY;  Surgeon: Florian Buff, MD;  Location: [facility];  Service: Gynecology;  Laterality: Bilateral;  . LAPAROSCOPIC GASTRIC SLEEVE RESECTION WITH HIATAL HERNIA REPAIR N/A 03/22/2016   Procedure: LAPAROSCOPIC GASTRIC SLEEVE RESECTION WITH HIATAL HERNIA REPAIR WITHUPPER ENDOSCOPY;  Surgeon: Alphonsa Overall, MD;  Location: [facility];  Service: General;  Laterality: N/A;  . SEPTOPLASTY  1988  . tonsillectomy  1991  . TONSILLECTOMY      Social History:  reports that she has never smoked. She has never used smokeless tobacco. She reports that she does not drink alcohol or use drugs.  Allergies:  Allergies  Allergen Reactions  .  Penicillins Shortness Of Breath    Pt tolerated ceftriaxone given in ED 10/12/14  . Zofran [Ondansetron Hcl] Shortness Of Breath    Numbness,  . Azithromycin Swelling  . Cimetidine Other (See Comments)    unknown  . Conjugated Estrogens Other (See Comments)    emotional  . Cyclobenzaprine Hcl     REACTION: disorientation  . Dicyclomine Hcl Other (See Comments)    unknown  . Flagyl [Metronidazole] Nausea Only  . Lisinopril     REACTION: RASH AND MOUTH  SORE  . Norco [Hydrocodone-Acetaminophen] Other (See Comments)    Felt like she was going through withdrawal  . Paroxetine Other (See Comments)    Unknown  . Percocet [Oxycodone-Acetaminophen]     Hypotension   . Rocephin [Ceftriaxone] Nausea Only  . Rosuvastatin Dermatitis  . Sulfa Antibiotics Hives and Itching  . Wellbutrin [Bupropion Hcl] Other (See Comments)    Emotional, makes depression worse  . Ciprofloxacin Itching and Rash  . Doxycycline Hyclate Itching and Rash  . Erythromycin Base Itching and Rash  . Prednisone Rash     (Not in a hospital admission)  Blood pressure 142/89, pulse 73, temperature 98.3 F (36.8 C), resp. rate 16, height _0  (1.626 m), weight 221 lb (100.2 kg), SpO2 100 %. Physical Exam: General: pleasant, WD/WN white female who is laying in bed in NAD HEENT: head is normocephalic, atraumatic.  Sclera are noninjected.  Mouth is pink and moist Heart: regular, rate, and rhythm.  No obvious murmurs, gallops, or rubs noted.  Palpable pedal pulses bilaterally Lungs: CTAB, no wheezes, rhonchi, or rales noted.  Respiratory effort nonlabored Abd: multiple well healed laparoscopic incisions, soft, ND, +BS, no masses, hernias, or organomegaly. TTP epigastric region MS: all 4 extremities are symmetrical with no cyanosis, clubbing, or edema. Skin: warm and dry with no masses, lesions, or rashes Psych: A&Ox3 with an appropriate affect. Neuro: CM 2-12 intact, extremity CSM intact bilaterally, normal speech  Results for orders placed or performed during the hospital encounter of 04/21/16 (from the past 48 hour(s))  CBG monitoring, ED     Status: None   Collection Time: 04/21/16 11:59 AM  Result Value Ref Range   Glucose-Capillary 92 65 - 99 mg/dL   Comment 1 Notify RN    Comment 2 Document in Chart   Lipase, blood     Status: None   Collection Time: 04/21/16 12:10 PM  Result Value Ref Range   Lipase 28 11 - 51 U/L  Comprehensive metabolic panel     Status:  None   Collection Time: 04/21/16 12:10 PM  Result Value Ref Range   Sodium 141 135 - 145 mmol/L   Potassium 3.6 3.5 - 5.1 mmol/L   Chloride 108 101 - 111 mmol/L   CO2 23 22 - 32 mmol/L   Glucose, Bld 96 65 - 99 mg/dL   BUN 14 6 - 20 mg/dL   Creatinine, Ser 0.74 0.44 - 1.00 mg/dL   Calcium 9.7 8.9 - 10.3 mg/dL   Total Protein 7.5 6.5 - 8.1 g/dL   Albumin 4.5 3.5 - 5.0 g/dL   AST 37 15 - 41 U/L   ALT 48 14 - 54 U/L   Alkaline Phosphatase 87 38 - 126 U/L   Total Bilirubin 1.1 0.3 - 1.2 mg/dL   GFR calc non Af Amer >60 >60 mL/min   GFR calc Af Amer >60 >60 mL/min    Comment: (NOTE) The eGFR has been calculated using the CKD EPI equation.  This calculation has not been validated in all clinical situations. eGFR's persistently <60 mL/min signify possible Chronic Kidney Disease.    Anion gap 10 5 - 15  CBC     Status: Abnormal   Collection Time: 04/21/16 12:10 PM  Result Value Ref Range   WBC 8.8 4.0 - 10.5 K/uL   RBC 5.26 (H) 3.87 - 5.11 MIL/uL   Hemoglobin 14.5 12.0 - 15.0 g/dL   HCT 43.8 36.0 - 46.0 %   MCV 83.3 78.0 - 100.0 fL   MCH 27.6 26.0 - 34.0 pg   MCHC 33.1 30.0 - 36.0 g/dL   RDW 15.6 (H) 11.5 - 15.5 %   Platelets 250 150 - 400 K/uL   No results found.    Assessment/Plan Nausea x3 weeks S/p Laparoscopic Sleeve gastrectomy (intraoperative upper endoscopy by Dr. Kae Heller), repair of hiatal hernia 03/22/16 Dr. Lucia Gaskins - 4 weeks postop, for 3 weeks she has been unable to tolerate anything by mouth without nausea or dry heaving - bowels functioning  HTN Anxiety  Plan - Admit patient to med-surg for IVF, NPO, antiemetics. CT abdomen/pelvis w contrast to eval for leak or any other postoperative complications.  Jerrye Beavers, [facility] Surgery 04/21/2016, 3:45 PM Pager: [phone] Consults: [phone] Mon-Fri 7:00 am-4:30 pm Sat-Sun 7:00 am-11:30 am

## 2016-04-21 NOTE — H&P (Signed)
[facility] Surgery Admission Note  Cassandra Mcconnell 1965/04/26  [id number].    Requesting MD: Long Chief Complaint/Reason for Consult: Nausea  HPI:  Cassandra Mcconnell is a 50yo female 4 weeks s/p Laparoscopic Sleeve gastrectomy (intraoperative upper endoscopy by Dr. Kae Heller) and repair of hiatal hernia 03/22/16 Dr. Lucia Gaskins. States that she has had intermittent central abdominal pain x3 weeks. It has been more severe/constant x2 days. This has been associated with nausea and dry heaving. She has not vomited. Denies constipation, diarrhea, or fevers. Her bowel habits are normal (2-3 BM's per day) and she is passing flatus. She also reports elevated BP, chills, dizziness, and fatigue. States that she has not been able to take anything PO x24 hours. Tried phenergan but this did not help. Patient did enquire if this could be due to stress? States that she has a son at home who is difficult to care for and they have had a lot going on at home, but she did not want to go into detail.  PMH significant for HTN, anxiety Denies home use of anticoagulants Employment: disabled  ROS: Review of Systems  Constitutional: Positive for chills and malaise/fatigue. Negative for fever.  HENT: Negative.   Respiratory: Negative.   Cardiovascular: Negative.   Gastrointestinal: Positive for abdominal pain and nausea. Negative for blood in stool, constipation, diarrhea, heartburn, melena and vomiting.  Genitourinary: Negative.   Skin: Negative.   Psychiatric/Behavioral: The patient is nervous/anxious.      All systems reviewed and otherwise negative except for as above  Family History  Problem Relation Age of Onset  . COPD Mother   . Hypertension Mother   . GER disease Mother   . Heart attack Father   . Cancer Father     prostate  . Hyperlipidemia Brother   . Hypertension Brother   . Diabetes Brother   . Neuropathy Brother   . Hyperlipidemia Brother   . Hypertension Brother   . Heart attack  Paternal Grandmother   . Heart attack Paternal Grandfather   . Stroke Paternal Grandfather     Past Medical History:  Diagnosis Date  . Anxiety   . Back pain   . Bell's palsy    Left side, 2009, 2017  . Depression   . Diverticulitis   . GERD (gastroesophageal reflux disease)   . History of hiatal hernia   . Hyperlipidemia   . Hypertension   . Wears glasses     Past Surgical History:  Procedure Laterality Date  . BACK SURGERY  2004   l4/5 fusion  . BREAST REDUCTION SURGERY Bilateral 08/08/2014   Procedure: BILATERAL BREAST REDUCTION  (BREAST);  Surgeon: Irene Limbo, MD;  Location: [facility];  Service: Plastics;  Laterality: Bilateral;  . CHOLECYSTECTOMY     1998  . LAPAROSCOPIC BILATERAL SALPINGO OOPHERECTOMY Bilateral 03/11/2015   Procedure: LAPAROSCOPIC BILATERAL SALPINGO OOPHORECTOMY;  Surgeon: Florian Buff, MD;  Location: [facility];  Service: Gynecology;  Laterality: Bilateral;  . LAPAROSCOPIC GASTRIC SLEEVE RESECTION WITH HIATAL HERNIA REPAIR N/A 03/22/2016   Procedure: LAPAROSCOPIC GASTRIC SLEEVE RESECTION WITH HIATAL HERNIA REPAIR WITHUPPER ENDOSCOPY;  Surgeon: Alphonsa Overall, MD;  Location: [facility];  Service: General;  Laterality: N/A;  . SEPTOPLASTY  1988  . tonsillectomy  1991  . TONSILLECTOMY      Social History:  reports that she has never smoked. She has never used smokeless tobacco. She reports that she does not drink alcohol or use drugs.  Allergies:  Allergies  Allergen  Reactions  . Penicillins Shortness Of Breath    Pt tolerated ceftriaxone given in ED 10/12/14  . Zofran [Ondansetron Hcl] Shortness Of Breath    Numbness,  . Azithromycin Swelling  . Cimetidine Other (See Comments)    unknown  . Conjugated Estrogens Other (See Comments)    emotional  . Cyclobenzaprine Hcl     REACTION: disorientation  . Dicyclomine Hcl Other (See Comments)    unknown  . Flagyl [Metronidazole] Nausea Only  . Lisinopril     REACTION: RASH AND MOUTH  SORE  . Norco [Hydrocodone-Acetaminophen] Other (See Comments)    Felt like she was going through withdrawal  . Paroxetine Other (See Comments)    Unknown  . Percocet [Oxycodone-Acetaminophen]     Hypotension   . Rocephin [Ceftriaxone] Nausea Only  . Rosuvastatin Dermatitis  . Sulfa Antibiotics Hives and Itching  . Wellbutrin [Bupropion Hcl] Other (See Comments)    Emotional, makes depression worse  . Ciprofloxacin Itching and Rash  . Doxycycline Hyclate Itching and Rash  . Erythromycin Base Itching and Rash  . Prednisone Rash     (Not in a hospital admission)  Blood pressure 142/89, pulse 73, temperature 98.3 F (36.8 C), resp. rate 16, height '5\' 4"'$  (1.626 m), weight 221 lb (100.2 kg), SpO2 100 %. Physical Exam: General: pleasant, WD/WN white female who is laying in bed in NAD HEENT: head is normocephalic, atraumatic.  Sclera are noninjected.  Mouth is pink and moist Heart: regular, rate, and rhythm.  No obvious murmurs, gallops, or rubs noted.  Palpable pedal pulses bilaterally Lungs: CTAB, no wheezes, rhonchi, or rales noted.  Respiratory effort nonlabored Abd: multiple well healed laparoscopic incisions, soft, ND, +BS, no masses, hernias, or organomegaly. TTP epigastric region MS: all 4 extremities are symmetrical with no cyanosis, clubbing, or edema. Skin: warm and dry with no masses, lesions, or rashes Psych: A&Ox3 with an appropriate affect. Neuro: CM 2-12 intact, extremity CSM intact bilaterally, normal speech  Results for orders placed or performed during the hospital encounter of 04/21/16 (from the past 48 hour(s))  CBG monitoring, ED     Status: None   Collection Time: 04/21/16 11:59 AM  Result Value Ref Range   Glucose-Capillary 92 65 - 99 mg/dL   Comment 1 Notify RN    Comment 2 Document in Chart   Lipase, blood     Status: None   Collection Time: 04/21/16 12:10 PM  Result Value Ref Range   Lipase 28 11 - 51 U/L  Comprehensive metabolic panel     Status:  None   Collection Time: 04/21/16 12:10 PM  Result Value Ref Range   Sodium 141 135 - 145 mmol/L   Potassium 3.6 3.5 - 5.1 mmol/L   Chloride 108 101 - 111 mmol/L   CO2 23 22 - 32 mmol/L   Glucose, Bld 96 65 - 99 mg/dL   BUN 14 6 - 20 mg/dL   Creatinine, Ser 0.74 0.44 - 1.00 mg/dL   Calcium 9.7 8.9 - 10.3 mg/dL   Total Protein 7.5 6.5 - 8.1 g/dL   Albumin 4.5 3.5 - 5.0 g/dL   AST 37 15 - 41 U/L   ALT 48 14 - 54 U/L   Alkaline Phosphatase 87 38 - 126 U/L   Total Bilirubin 1.1 0.3 - 1.2 mg/dL   GFR calc non Af Amer >60 >60 mL/min   GFR calc Af Amer >60 >60 mL/min    Comment: (NOTE) The eGFR has been calculated using the  CKD EPI equation. This calculation has not been validated in all clinical situations. eGFR's persistently <60 mL/min signify possible Chronic Kidney Disease.    Anion gap 10 5 - 15  CBC     Status: Abnormal   Collection Time: 04/21/16 12:10 PM  Result Value Ref Range   WBC 8.8 4.0 - 10.5 K/uL   RBC 5.26 (H) 3.87 - 5.11 MIL/uL   Hemoglobin 14.5 12.0 - 15.0 g/dL   HCT 43.8 36.0 - 46.0 %   MCV 83.3 78.0 - 100.0 fL   MCH 27.6 26.0 - 34.0 pg   MCHC 33.1 30.0 - 36.0 g/dL   RDW 15.6 (H) 11.5 - 15.5 %   Platelets 250 150 - 400 K/uL   No results found.    Assessment/Plan Nausea x3 weeks S/p Laparoscopic Sleeve gastrectomy (intraoperative upper endoscopy by Dr. Kae Heller), repair of hiatal hernia 03/22/16 Dr. Lucia Gaskins - 4 weeks postop, for 3 weeks she has been unable to tolerate anything by mouth without nausea or dry heaving - bowels functioning  HTN Anxiety  Plan - Admit patient to med-surg for IVF, antiemetics. Ok for bariatric clears as tolerated. CT abdomen/pelvis w contrast to eval for leak or any other postoperative complications.  Jerrye Beavers, [facility] Surgery 04/21/2016, 3:45 PM Pager: [phone] Consults: [phone] Mon-Fri 7:00 am-4:30 pm Sat-Sun 7:00 am-11:30 am  Agree with above.  She does not look that sick or dehydrated  and all labs look good.  But it is 5 PM in the afternoon and I think that she is best admitted.  Will get abdominal CT scan to evaluate upper abdomen and gastric pouch. Then re-evaluate in the AM.  Alphonsa Overall, MD, [facility] Surgery Pager: [phone] Office phone:  [phone]

## 2016-04-21 NOTE — ED Notes (Signed)
Pt Is unable to provide urine sample in triage but is aware that one is needed

## 2016-04-22 ENCOUNTER — Telehealth: Payer: Self-pay

## 2016-04-22 NOTE — Telephone Encounter (Signed)
Spoke to Cassandra Mcconnell who states that they were wanting a suggestion on how to treat pt for UTI, due to her multiple allergies. Cassandra Mcconnell states they proceeded with giving her septra and advised her to take it along with Benadryl. No additional cultures were performed to confirm what medications the UTI are sensitive to.

## 2016-04-22 NOTE — Telephone Encounter (Signed)
Left a message on Triage line asking to speak to Dr Hulen Shouts assistant about this mutual patient.

## 2016-05-05 ENCOUNTER — Encounter: Payer: Self-pay | Admitting: Family Medicine

## 2016-05-05 ENCOUNTER — Ambulatory Visit (INDEPENDENT_AMBULATORY_CARE_PROVIDER_SITE_OTHER): Payer: Commercial Managed Care - HMO | Admitting: Family Medicine

## 2016-05-05 VITALS — BP 104/64 | HR 68 | Temp 98.3°F | Wt 217.2 lb

## 2016-05-05 DIAGNOSIS — H1131 Conjunctival hemorrhage, right eye: Secondary | ICD-10-CM | POA: Diagnosis not present

## 2016-05-05 NOTE — Progress Notes (Signed)
R eye red, medially.  Noted yesterday AM.  No acute vision changes.  No eye pain.  No eye discharge.  Was redder yesterday.  No L eye changes.  H/o L facial/bell's palsy at baseline.  No FCNAVD.  No FB sensation in the eye.  No eye trauma. Wears glasses, no contacts.  No ST, no ear pain.  Some rhinorrhea at baseline.    Meds, vitals, and allergies reviewed.   ROS: Per HPI unless specifically indicated in ROS section   nad ncat Tm wnl  Nasal and OP exam wnl PERRL EOMI R eye with resolving medial subconjunctival hemorrhage noted No discharge

## 2016-05-05 NOTE — Patient Instructions (Addendum)
This should gradually thin out and resolve.  Take care.  Update [place] as needed.

## 2016-05-05 NOTE — Progress Notes (Signed)
Pre visit review using our clinic review tool, if applicable. No additional management support is needed unless otherwise documented below in the visit note. 

## 2016-05-06 DIAGNOSIS — H1130 Conjunctival hemorrhage, unspecified eye: Secondary | ICD-10-CM | POA: Insufficient documentation

## 2016-05-06 NOTE — Assessment & Plan Note (Signed)
Reassured, resolving already, f/u prn.  She agrees.

## 2016-05-16 ENCOUNTER — Ambulatory Visit: Payer: Self-pay | Admitting: Dietician

## 2016-05-19 ENCOUNTER — Ambulatory Visit: Payer: Self-pay | Admitting: Dietician

## 2016-05-19 ENCOUNTER — Ambulatory Visit: Payer: Commercial Managed Care - HMO | Admitting: Psychiatry

## 2016-05-31 ENCOUNTER — Encounter: Payer: Commercial Managed Care - HMO | Attending: Surgery | Admitting: Dietician

## 2016-05-31 ENCOUNTER — Encounter: Payer: Self-pay | Admitting: Dietician

## 2016-05-31 DIAGNOSIS — Z6841 Body Mass Index (BMI) 40.0 and over, adult: Secondary | ICD-10-CM | POA: Diagnosis not present

## 2016-05-31 DIAGNOSIS — Z713 Dietary counseling and surveillance: Secondary | ICD-10-CM | POA: Diagnosis not present

## 2016-05-31 NOTE — Progress Notes (Signed)
  Follow-up visit:  2.5 months Post-Operative Sleeve Gastrectomy Surgery  Medical Nutrition Therapy:  Appt start time: [id number] end time:  940  Primary concerns today: Post-operative Bariatric Surgery Nutrition Management. Cassandra Mcconnell returns having lost a total of 40 pounds. She was previously struggling with severe nausea and reports that this has resolved. Constipation is getting better also and no longer needing to take Colace as often. No longer drinking protein shakes.  Surgery date: 03/22/2016 Surgery type: Sleeve gastrectomy Start weight at [facility]: 246 lbs on 01/08/2016 (253 lbs per patient) Weight today: 213.6 lbs Weight change: 12 lbs Total weight lost: 40 lbs from highest weight  Goal weight: 175 lbs  TANITA  BODY COMP RESULTS  01/18/16 04/13/16 05/31/16   BMI (kg/m^2) N/A 38.7 36.7   Fat Mass (lbs)  118.8 108   Fat Free Mass (lbs)  106.8 105.6   Total Body Water (lbs)  77.6 76.4    Preferred Learning Style:   No preference indicated   Learning Readiness:   Ready  24-hr recall: B (AM): [place] yogurt OR egg with 2 pieces of bacon (12g) Snk (AM): cheese (7g) L (PM): 2.5 oz chicken (14-16 g) Snk (PM):  D (PM): pork chop with beans or salad (14-20 g) Snk (PM): cheese or protein bar (7-10g)   Fluid intake: did not assess Estimated total protein intake: 60 g  Medications: see list  Supplementation: taking, struggles with all the calcium  Using straws: No Drinking while eating: small sips Hair loss: No Carbonated beverages: No N/V/D/C: none Dumping syndrome: No   Recent physical activity:  Walking every day 5-30 minutes  Progress Towards Goal(s):  In progress.  Handouts given during visit include:  Phase 3B lean Protein + non starchy vegetables    Nutritional Diagnosis:  -3.3 Overweight/obesity related to past poor dietary habits and physical inactivity as evidenced by patient w/ recent sleeve gastrectomy surgery following dietary guidelines for continued  weight loss.    Intervention:  Nutrition counseling provided.  Teaching Method Utilized:  Visual Auditory Hands on  Barriers to learning/adherence to lifestyle change: stress  Demonstrated degree of understanding via:  Teach Back   Monitoring/Evaluation:  Dietary intake, exercise, and body weight. Follow up in 2 months for 4.5 month post-op visit.

## 2016-05-31 NOTE — Patient Instructions (Addendum)
Goals:  Follow Phase 3B: High Protein + Non-Starchy Vegetables  Eat 3-6 small meals/snacks, every 3-5 hrs  Increase lean protein foods to meet 60g goal  Increase fluid intake to 64oz +  Avoid drinking 15 minutes before, during and 30 minutes after eating  Aim for >30 min of physical activity daily  Surgery date: 03/22/2016 Surgery type: Sleeve gastrectomy Start weight at [facility]: 246 lbs on 01/08/2016 (253 lbs per patient) Weight today: 213.6 lbs Weight change: 12 lbs Total weight lost: 40 lbs from highest weight  Goal weight: 175 lbs  TANITA  BODY COMP RESULTS  01/18/16 04/13/16 05/31/16   BMI (kg/m^2) N/A 38.7 36.7   Fat Mass (lbs)  118.8 108   Fat Free Mass (lbs)  106.8 105.6   Total Body Water (lbs)  77.6 76.4

## 2016-06-06 ENCOUNTER — Telehealth: Payer: Self-pay

## 2016-06-06 NOTE — Telephone Encounter (Signed)
I spoke with pt and she did not go to ED or UC. Pt said it took 10 hours to get a cb from nurse at [facility] and when pt was explaining how sick she was to the nurse pt said the nurse laughed. Pt wants nurse with [facility] reported. Will send note to [facility] office manager and Lorenda Peck RN. Pt said she is feeling better today and will cb if needed.

## 2016-06-06 NOTE — Telephone Encounter (Signed)
PLEASE NOTE: All timestamps contained within this report are represented as [place] Standard Time. CONFIDENTIALTY NOTICE: This fax transmission is intended only for the addressee. It contains information that is legally privileged, confidential or otherwise protected from use or disclosure. If you are not the intended recipient, you are strictly prohibited from reviewing, disclosing, copying using or disseminating any of this information or taking any action in reliance on or regarding this information. If you have received this fax in error, please notify us immediately by telephone so that we can arrange for its return to [place]. Phone: [phone], Toll-Free: [phone], Fax: [phone] Page: 1 of 1 Call Id: [id number] Startup Patient Name: Cassandra Mcconnell Gender: Female DOB: 11/01/1965 Age: 51 Y 9 M 27 D Return Phone Number: [phone] (Primary), [phone] (Secondary) City/State/Zip: [place] [address] Client [place] [facility] - Client Client Site [place] Physician Arnette Norris - MD Who Is Calling Patient / Member / Family / Caregiver Call Type Triage / Clinical Relationship To Patient Self Return Phone Number [phone] (Primary) Chief Complaint Diarrhea Reason for Call Symptomatic / Request for [name] states that she has flu symptoms, diarrhea, no fever Nurse Assessment Nurse: Georgina Peer, RN, Kendrick Fries Date/Time (Eastern Time): 06/05/2016 10:02:27 PM Confirm and document reason for call. If symptomatic, describe symptoms. ---Caller states that she has flu symptoms, watery diarrhea 10x ,vomited 5x, no fever, not able to keep anything down. This started yesterday. Does the PT have any chronic conditions? (i.e. diabetes, asthma, etc.) ---Yes List chronic conditions. ---anxiety, HTN, s/p bariatric  surgery in November, Is the patient pregnant or possibly pregnant? (Ask all females between the ages of 78-55) ---No Guidelines Guideline Title Affirmed Question Vomiting [1] Drinking very little AND [2] dehydration suspected (e.g., no urine > 12 hours, very dry mouth, very lightheaded) Disp. Time Eilene Ghazi Time) Disposition Final User 06/05/2016 10:11:20 PM Go to ED Now (or PCP triage) Yes Georgina Peer, RN, Kendrick Fries Referrals GO TO [place] Advice Given Per Guideline GO TO ED NOW (OR PCP TRIAGE): * IF NO PCP TRIAGE: You need to be seen. Go to the [facility] at [facility] within the next hour. Leave as soon as you can. CARE ADVICE per Vomiting (Adult) guideline.

## 2016-06-15 ENCOUNTER — Ambulatory Visit: Payer: Commercial Managed Care - HMO | Admitting: Neurology

## 2016-06-30 ENCOUNTER — Telehealth: Payer: Self-pay | Admitting: Family Medicine

## 2016-06-30 NOTE — Telephone Encounter (Signed)
Pt declined to schedule AWV °

## 2016-08-10 ENCOUNTER — Encounter: Payer: Commercial Managed Care - HMO | Attending: Surgery | Admitting: Skilled Nursing Facility1

## 2016-08-10 ENCOUNTER — Encounter: Payer: Self-pay | Admitting: Skilled Nursing Facility1

## 2016-08-10 DIAGNOSIS — Z713 Dietary counseling and surveillance: Secondary | ICD-10-CM | POA: Insufficient documentation

## 2016-08-10 DIAGNOSIS — Z6841 Body Mass Index (BMI) 40.0 and over, adult: Secondary | ICD-10-CM | POA: Diagnosis not present

## 2016-08-10 NOTE — Progress Notes (Signed)
Follow-up visit:  2.5 months Post-Operative Sleeve Gastrectomy Surgery  Medical Nutrition Therapy:  Appt start time: 2:37 end time:  3:13  Primary concerns today: Post-operative Bariatric Surgery Nutrition Management.  Pt states everything is okay but then by the end of the appointment admits to her stress..Pt states her 52 year old Son is in a mental health facility for violent outburst which has triggered PTSD in her 107 year old daughter who now needs counseling.Pt states she has a therapist she trusts and has worked with in the past but cannot start seeing her until her daughter is better. Pt states she knows she is getting enough protein. Pt states she gets a Sick feeling with beef products. Pt states she went from a band size of 26 to 18-20; had breast surgery. Pt states she is having to take tylenol often to ease pain in her mid-upper back, has a well fitting bra. 22-16-18 skirt size, from a size 10 to a 8.5. Pt states she gets up in the night 3-4 times due to thirst, pt states she cannot drink more than she already is, having tried lemon water and flavored water as well as plain water. Pt states she is burnt out on protein shakes.    Surgery date: 03/22/2016 Surgery type: Sleeve gastrectomy Start weight at [facility]: 246 lbs on 01/08/2016 (253 lbs per patient) Weight today: 204.2 lbs Weight change: 9.4 lbs  Goal weight: 175 lbs  TANITA  BODY COMP RESULTS  01/18/16 04/13/16 05/31/16 08/10/2016   BMI (kg/m^2) N/A 38.7 36.7 35.1   Fat Mass (lbs)  118.8 108 98.6   Fat Free Mass (lbs)  106.8 105.6 105.6   Total Body Water (lbs)  77.6 76.4 75.8    Preferred Learning Style:   No preference indicated   Learning Readiness:   Ready  24-hr recall: B (AM): yogurt and egg (10g) Snk (AM): cheese (7g) L (PM): salad with chicken and cheese (21g) Snk (PM):  D (PM): pork chop with beans or salad (14-20 g) Snk (PM): cheese or protein bar (7-10g)   Fluid intake: unable to get in 64 fluid ounces  closer 48 ounces with 50/50 orange juice and water and coffee  Estimated total protein intake: 60 g  Medications: see list  Supplementation: 2 flinstone complete, b12, iron, calcium, b complex (pt has not been taking these in proper sequence)  Using straws: No Drinking while eating: small sips Hair loss: Patches out and its thin Carbonated beverages: No N/V/D/C: none Dumping syndrome: No Drinking while eating: no Having you been chewing well:no Chewing/swallowing difficulties: no Changes in vision:  Changes to mood/headaches: no Hair loss/Changes to skin/Changes to nails: no Any difficulty focusing or concentrating: no Sweating: no Dizziness/Lightheaded: no Palpitations: no  Carbonated beverages: no N/V/D/C/GAS: will not have a bowel movement unless taking colace  Abdominal Pain: no   Recent physical activity:  Back to gym after being out  Progress Towards Goal(s):  In progress.  Handouts given during visit include:  Phase 3B lean Protein + non starchy vegetables    Nutritional Diagnosis:  [place]-3.3 Overweight/obesity related to past poor dietary habits and physical inactivity as evidenced by patient w/ recent sleeve gastrectomy surgery following dietary guidelines for continued weight loss.    Intervention:  Nutrition counseling provided. Goals: -Read your B-complex and you need at least 12mg  of thiamin  -Aim for at least 64 fluid  -Try half a banana to see if that helps with your bowel movements  -Try vanilla shake in  your coffee -Try taking a probiotic or try kefir in the yogurt aisle  -Take your calcium 3 times a day  Teaching Method Utilized:  Visual Auditory Hands on  Barriers to learning/adherence to lifestyle change: stress  Demonstrated degree of understanding via:  Teach Back   Monitoring/Evaluation:  Dietary intake, exercise, and body weight. Follow up in 2 months for 4.5 month post-op visit.

## 2016-08-10 NOTE — Patient Instructions (Addendum)
-  Read your B-complex and you need at least 12mg  of thiamin   -Aim for at least 64 fluid   -Try half a banana to see if that helps with your bowel movements   -Try vanilla shake in your coffee  -Try taking a probiotic or try kefir in the yogurt aisle   -Take your calcium 3 times a day

## 2016-08-11 ENCOUNTER — Other Ambulatory Visit: Payer: Self-pay | Admitting: Family Medicine

## 2016-08-11 NOTE — Telephone Encounter (Signed)
Pt called to ck on refill citalopram. Advised already sent to [facility]. Pt voiced understanding.

## 2016-08-15 ENCOUNTER — Telehealth: Payer: Self-pay | Admitting: Neurology

## 2016-08-15 NOTE — Telephone Encounter (Signed)
Called pt and offered 1st available w/ Dr. Jaynee Eagles in May. Sooner appt scheduled per pt request for tomorrow w/ Hoyle Sauer NP.

## 2016-08-15 NOTE — Telephone Encounter (Signed)
Appointment Request From: Cassandra Mcconnell. Cassandra Mcconnell    With Provider: Melvenia Beam, MD [[facility]]    Preferred Date Range: From 08/15/2016 To 08/22/2016    Preferred Times: Any    Reason for visit: Office Visit    Comments:  I need to see Dr. Jaynee Eagles for a follow-up with the Bells Palsy. It has been 11 months since this onset. It has not cleared up like it should have. In the last few weeks I have had some regression with it. I have noticed that my left eye is starting to droop and I can feel the nerves around my eye moving and jumping.

## 2016-08-16 ENCOUNTER — Ambulatory Visit (INDEPENDENT_AMBULATORY_CARE_PROVIDER_SITE_OTHER): Payer: Medicare HMO | Admitting: Nurse Practitioner

## 2016-08-16 ENCOUNTER — Encounter: Payer: Self-pay | Admitting: Nurse Practitioner

## 2016-08-16 VITALS — BP 105/65 | HR 60 | Wt 204.2 lb

## 2016-08-16 DIAGNOSIS — G510 Bell's palsy: Secondary | ICD-10-CM

## 2016-08-16 DIAGNOSIS — G527 Disorders of multiple cranial nerves: Secondary | ICD-10-CM | POA: Diagnosis not present

## 2016-08-16 MED ORDER — GABAPENTIN 300 MG PO CAPS: 300.0000 mg | ORAL_CAPSULE | Freq: Every day | ORAL | 6 refills | Status: DC

## 2016-08-16 NOTE — Progress Notes (Signed)
[facility]  PATIENT: Cassandra Mcconnell DOB: August 13, 1965   REASON FOR VISIT: Follow-up for Bell's palsy HISTORY FROM: Patient    HISTORY OF PRESENT ILLNESS UPDATE 4/24/2018CM Cassandra Mcconnell, 51 year old female returns for follow-up with a prior history of Bell's palsy in 2009 and recurrence in May 2017. She returns today with continued dull pain on the left side of her face and some left facial weakness and intermittent droopiness of the left eye. She has some occasional double vision. She denies any trouble swallowing or chewing she has not had any weakness or numbness of the arms or legs. She claims she has been under a lot of stress recently her son who is age 42 is at [facility] because he is wanting to kill people. She returns for reevaluation  11/05/15 KWMs. Cassandra Mcconnell is a 51 year old left-handed white female with a history of prior left-sided Bell's palsy in 2009. The patient had a good recovery from this which occurred within only a few weeks of onset of the weakness. The patient has had recurrence of the same event that began in late May 2017. The patient started with a three-day history of left occipital headache that then progressed to severe pain involving the left ear with onset of left-sided facial weakness on 09/22/2015. The patient has had some hyperacusis with the left ear, she denies any change in taste. She has had some blurred vision and light sensitivity with the left eye. She denies trouble swallowing or chewing. She is concerned that she has not gotten better from her Bell's palsy at this point. The patient denies any numbness or weakness of the arms or legs, she denies any balance issues or difficulty controlling the bowels or the bladder. She reports some monocular double vision in the left eye. When the left eye is covered, there is no double vision with the right eye. She is using a methylcellulose eyedrop. She reports a history of dryness of the eyes prior to the  onset of the Bell's palsy, she has had some issues with tearing of the eye on the left after onset of the Bell's palsy. She returns for an evaluation.   10/01/15 Cassandra Mcconnell is a 51 y.o. female here as a referral from Dr. Deborra Medina for Bell's palsy. Past medical history of anxiety, hyperlipidemia, hypertension, depression, Bell's palsy. She started getting a headache last Monday and Tuesday she noticed weakness, she woke up Wednesday with weakness and ear pain. No inciting events, no trauma, no rash or other. She is having associated pain in the left ear. Things sound louder. It is giving her a headache. tylenol and ibuprofen not helping. She has a history of remote bell's palsy that cleared up in 2 weeks. Patient went to the ED and she was started on prednisone and valtrex. She is using drops in the eyes to lubricate, she is aware of the risk of drying out and corneal damage, wearing glasses all the time to protect her eyes given decrease in blink rate (discussed all these again and the need for vigilance). She can shut her eye and so her eye is closed all night, no dry eye in the morning. No inciting event or head trauma or illness. Her eye is blurry on the left. She has been stressed. She has left facial droop, difficulty closing the left eye with decreased blink, blurry left eye vision. No other focal neurologic symptoms or complaints.   REVIEW OF SYSTEMS: Full 14 system review of systems performed and notable  only for those listed, all others are neg:  Constitutional: neg  Cardiovascular: neg Ear/Nose/Throat: neg  Skin: neg Eyes: Discomfort under the left eye Respiratory: neg Gastroitestinal: neg  Hematology/Lymphatic: neg  Endocrine: neg Musculoskeletal:neg Allergy/Immunology: neg Neurological: Left facial drooping Psychiatric: Anxiety Sleep : neg   ALLERGIES: Allergies  Allergen Reactions  . Penicillins Shortness Of Breath    Pt tolerated ceftriaxone given in ED 10/12/14  . Zofran  [Ondansetron Hcl] Shortness Of Breath    Numbness,  . Azithromycin Swelling  . Cimetidine Other (See Comments)    unknown  . Conjugated Estrogens Other (See Comments)    emotional  . Cyclobenzaprine Hcl     REACTION: disorientation  . Dicyclomine Hcl Other (See Comments)    unknown  . Flagyl [Metronidazole] Nausea Only  . Lisinopril     REACTION: RASH AND MOUTH SORE  . Norco [Hydrocodone-Acetaminophen] Other (See Comments)    Felt like she was going through withdrawal  . Nsaids Other (See Comments)    Cannot have because of bariatric surgery  . Paroxetine Other (See Comments)    Unknown  . Percocet [Oxycodone-Acetaminophen]     Hypotension   . Rocephin [Ceftriaxone] Nausea Only  . Rosuvastatin Dermatitis  . Sulfa Antibiotics Hives and Itching  . Wellbutrin [Bupropion Hcl] Other (See Comments)    Emotional, makes depression worse  . Ciprofloxacin Itching and Rash  . Doxycycline Hyclate Itching and Rash  . Erythromycin Base Itching and Rash  . Prednisone Rash    HOME MEDICATIONS: Outpatient Medications Prior to Visit  Medication Sig Dispense Refill  . acetaminophen (TYLENOL) 500 MG tablet Take 1,000 mg by mouth every 6 (six) hours as needed. For pain     . calcium gluconate 500 MG tablet Take 1 tablet by mouth daily.    Marland Kitchen CALCIUM PO Take 1 tablet by mouth daily.    . citalopram (CELEXA) 20 MG tablet TAKE 1 TABLET TWICE DAILY 180 tablet 0  . cyanocobalamin 1000 MCG tablet Take 1,000 mcg by mouth daily.    Marland Kitchen docusate sodium (COLACE) 100 MG capsule Take 100 mg by mouth daily as needed for mild constipation.    . metoprolol tartrate (LOPRESSOR) 25 MG tablet Take 25 mg by mouth 2 (two) times daily.    . NON FORMULARY Bariatric Advantage - MV/Mineral - twice daily    . pantoprazole (PROTONIX) 40 MG tablet Take 40 mg by mouth daily.    . polyvinyl alcohol-povidone (REFRESH) 1.4-0.6 % ophthalmic solution 1-2 drops as needed.    . triamcinolone cream (KENALOG) 0.1 % APPLY TO  AFFECTED AREA TWICE DAILY. 30 g 0   No facility-administered medications prior to visit.     PAST MEDICAL HISTORY: Past Medical History:  Diagnosis Date  . Anxiety   . Back pain   . Bell's palsy    Left side, 2009, 2017  . Depression   . Diverticulitis   . GERD (gastroesophageal reflux disease)   . History of hiatal hernia   . Hyperlipidemia   . Hypertension   . Postoperative nausea 04/21/2016  . Wears glasses     PAST SURGICAL HISTORY: Past Surgical History:  Procedure Laterality Date  . BACK SURGERY  2004   l4/5 fusion  . BREAST REDUCTION SURGERY Bilateral 08/08/2014   Procedure: BILATERAL BREAST REDUCTION  (BREAST);  Surgeon: Irene Limbo, MD;  Location: [place] [facility];  Service: Plastics;  Laterality: Bilateral;  . CHOLECYSTECTOMY     1998  . LAPAROSCOPIC BILATERAL SALPINGO OOPHERECTOMY Bilateral  03/11/2015   Procedure: LAPAROSCOPIC BILATERAL SALPINGO OOPHORECTOMY;  Surgeon: Florian Buff, MD;  Location: [facility];  Service: Gynecology;  Laterality: Bilateral;  . LAPAROSCOPIC GASTRIC SLEEVE RESECTION WITH HIATAL HERNIA REPAIR N/A 03/22/2016   Procedure: LAPAROSCOPIC GASTRIC SLEEVE RESECTION WITH HIATAL HERNIA REPAIR WITHUPPER ENDOSCOPY;  Surgeon: Alphonsa Overall, MD;  Location: [facility];  Service: General;  Laterality: N/A;  . SEPTOPLASTY  1988  . tonsillectomy  1991  . TONSILLECTOMY      FAMILY HISTORY: Family History  Problem Relation Age of Onset  . COPD Mother   . Hypertension Mother   . GER disease Mother   . Heart attack Father   . Cancer Father     prostate  . Hyperlipidemia Brother   . Hypertension Brother   . Diabetes Brother   . Neuropathy Brother   . Hyperlipidemia Brother   . Hypertension Brother   . Heart attack Paternal Grandmother   . Heart attack Paternal Grandfather   . Stroke Paternal Grandfather     SOCIAL HISTORY: Social History   Social History  . Marital status: Married    Spouse name: N/A  . Number of children: 1  .  Years of education: N/A   Occupational History  . stay at home mom    Social History Main Topics  . Smoking status: Never Smoker  . Smokeless tobacco: Never Used  . Alcohol use No  . Drug use: No  . Sexual activity: Not on file   Other Topics Concern  . Not on file   Social History Narrative  . No narrative on file     PHYSICAL EXAM  Vitals:   08/16/16 0908  BP: 105/65  Pulse: 60  Weight: 204 lb 3.2 oz (92.6 kg)   Body mass index is 35.05 kg/m.  Generalized: Well developed, Obese female in no acute distress  Head: normocephalic and atraumatic,. Oropharynx benign   Neurological examination   Mentation: Alert oriented to time, place, history taking. Attention span and concentration appropriate. Recent and remote memory intact.  Follows all commands speech and language fluent.   Cranial nerve II-XII: Pupils were equal round reactive to light extraocular movements were full, visual field were full on confrontational test. Facial symmetry is not present, patient has significant facial weakness on the left, she is able to raise the left eyebrow and close the  left eye.  Sensation is less on the left than the right to pinprick.  Uvula tongue midline. head turning and shoulder shrug were normal and symmetric.Tongue protrusion into cheek strength was normal. Motor: normal bulk and tone, full strength in the BUE, BLE, fine finger movements normal, no pronator drift. No focal weakness Sensory: normal and symmetric to light touch, pinprick, and  Vibration, in the upper and lower extremities except decreased sensation to the left face  Coordination: finger-nose-finger, heel-to-shin bilaterally, no dysmetria Reflexes: Brachioradialis 2/2, biceps 2/2, triceps 2/2, patellar 2/2, Achilles 2/2, plantar responses were flexor bilaterally. Gait and Station: Rising up from seated position without assistance, normal stance,  moderate stride, good arm swing, smooth turning, able to perform tiptoe,  and heel walking without difficulty. Tandem gait is steady  DIAGNOSTIC DATA (LABS, IMAGING, TESTING) - I reviewed patient records, labs, notes, testing and imaging myself where available.  Lab Results  Component Value Date   WBC 8.8 04/21/2016   HGB 14.5 04/21/2016   HCT 43.8 04/21/2016   MCV 83.3 04/21/2016   PLT 250 04/21/2016      Component Value Date/Time  NA 141 04/21/2016 1210   NA 140 10/01/2015 1429   K 3.6 04/21/2016 1210   CL 108 04/21/2016 1210   CO2 23 04/21/2016 1210   GLUCOSE 96 04/21/2016 1210   BUN 14 04/21/2016 1210   BUN 18 10/01/2015 1429   CREATININE 0.74 04/21/2016 1210   CALCIUM 9.7 04/21/2016 1210   PROT 7.5 04/21/2016 1210   PROT 6.9 10/01/2015 1429   ALBUMIN 4.5 04/21/2016 1210   ALBUMIN 4.1 10/01/2015 1429   AST 37 04/21/2016 1210   ALT 48 04/21/2016 1210   ALKPHOS 87 04/21/2016 1210   BILITOT 1.1 04/21/2016 1210   BILITOT 0.5 10/01/2015 1429   GFRNONAA >60 04/21/2016 1210   GFRAA >60 04/21/2016 1210   Lab Results  Component Value Date   CHOL 169 10/15/2015   HDL 33.10 (L) 10/15/2015   LDLCALC 122 (H) 10/15/2014   LDLDIRECT 105.0 10/15/2015   TRIG 236.0 (H) 10/15/2015   CHOLHDL 5 10/15/2015    Lab Results  Component Value Date   TSH 1.460 10/01/2015      ASSESSMENT AND PLAN  51 y.o. year old female  has a past medical history of Anxiety; Back pain; Bell's palsy;Which is recurrent. The fact that she has had prior Bell's palsy reduces the chances of full recovery.    PLAN: Try Gabapentin 300mg  at hs for nerve pain and discomfort Continue to use artificial tears  Follow up in 6 months I spent 25 min in total face to face time with the patient more than 50% of which was spent counseling and coordination of care, reviewing test results reviewing medications and discussing and reviewing the diagnosis of recurrent Bell's palsy and further treatment options. , Rayburn Ma, Sanford [place], APRN  [facility] [address] [place], [place] [address] [phone]

## 2016-08-16 NOTE — Patient Instructions (Addendum)
Try Gabapentin 300mg  at hs for nerve pain Follow up in 6 months  Bell Palsy Bell palsy is a condition in which the muscles on one side of the face become paralyzed. This often causes one side of the face to droop. It is a common condition and most people recover completely. RISK FACTORS Risk factors for Bell palsy include:  Pregnancy.  Diabetes.  An infection by a virus, such as infections that cause cold sores. CAUSES  Bell palsy is caused by damage to or inflammation of a nerve in your face. It is unclear why this happens, but an infection by a virus may lead to it. Most of the time the reason it happens is unknown. SIGNS AND SYMPTOMS  Symptoms can range from mild to severe and can take place over a number of hours. Symptoms may include:  Being unable to:  Raise one or both eyebrows.  Close one or both eyes.  Feel parts of your face (facial numbness).  Drooping of the eyelid and corner of the mouth.  Weakness in the face.  Paralysis of half your face.  Loss of taste.  Sensitivity to loud noises.  Difficulty chewing.  Tearing up of the affected eye.  Dryness in the affected eye.  Drooling.  Pain behind one ear. DIAGNOSIS  Diagnosis of Bell palsy may include:  A medical history and physical exam.  An MRI.  A CT scan.  Electromyography (EMG). This is a test that checks how your nerves are working. TREATMENT  Treatment may include antiviral medicine to help shorten the length of the condition. Sometimes treatment is not needed and the symptoms go away on their own. HOME CARE INSTRUCTIONS   Take medicines only as directed by your health care provider.  Do facial massages and exercises as directed by your health care provider.  If your eye is affected:  Use moisturizing eye drops to prevent drying of your eye as directed by your health care provider.  Protect your eye as directed by your health care provider. SEEK MEDICAL CARE IF:  Your symptoms do  not get better or get worse.  You are drooling.  Your eye is red, irritated, or hurts. SEEK IMMEDIATE MEDICAL CARE IF:   Another part of your body feels weak or numb.  You have difficulty swallowing.  You have a fever along with symptoms of Bell palsy.  You develop neck pain. MAKE SURE YOU:   Understand these instructions.  Will watch your condition.  Will get help right away if you are not doing well or get worse. This information is not intended to replace advice given to you by your health care provider. Make sure you discuss any questions you have with your health care provider. Document Released: 04/11/2005 Document Revised: 12/31/2014 Document Reviewed: 07/19/2013 Elsevier Interactive Patient Education  2017 [employer].

## 2016-08-18 ENCOUNTER — Telehealth: Payer: Self-pay | Admitting: Nurse Practitioner

## 2016-08-18 MED ORDER — GABAPENTIN 100 MG PO CAPS: 100.0000 mg | ORAL_CAPSULE | Freq: Every day | ORAL | 3 refills | Status: DC

## 2016-08-18 NOTE — Telephone Encounter (Signed)
Spoke with patient and informed her that Cassandra Mcconnell has sent in new Rx for decreased dose. Advised she will take Gabapentin 100 mg at bedtime. Advised she may take during the day as needed if tolerated. She verbalized understanding, appreciation. She stated she would call if any problems.

## 2016-08-18 NOTE — Telephone Encounter (Signed)
Patient called office in reference gabapentin (NEURONTIN) 300 MG capsule.  Patient states the medication worked well for the nerve pain but patient can not take the 300mg .  Patient states she felt groggy for over 24hrs.  Please call after 12:00 per patient.  Pharmacy- [facility]

## 2016-08-18 NOTE — Telephone Encounter (Signed)
Please tell patient to trial 100 mg dose at bedtime and when necessary during the day if tolerated. I will call this and her local pharmacy

## 2016-08-18 NOTE — Addendum Note (Signed)
Addended by: Otilio Jefferson on: 08/18/2016 12:02 PM   Modules accepted: Orders

## 2016-08-22 ENCOUNTER — Encounter: Payer: Self-pay | Admitting: Family Medicine

## 2016-08-22 ENCOUNTER — Ambulatory Visit (INDEPENDENT_AMBULATORY_CARE_PROVIDER_SITE_OTHER): Payer: Commercial Managed Care - HMO | Admitting: Family Medicine

## 2016-08-22 VITALS — BP 102/60 | HR 63 | Temp 97.4°F | Wt 202.0 lb

## 2016-08-22 DIAGNOSIS — I10 Essential (primary) hypertension: Secondary | ICD-10-CM

## 2016-08-22 DIAGNOSIS — F329 Major depressive disorder, single episode, unspecified: Secondary | ICD-10-CM

## 2016-08-22 DIAGNOSIS — F32A Depression, unspecified: Secondary | ICD-10-CM

## 2016-08-22 DIAGNOSIS — R5383 Other fatigue: Secondary | ICD-10-CM

## 2016-08-22 LAB — VITAMIN D 25 HYDROXY (VIT D DEFICIENCY, FRACTURES): VITD: 34.23 ng/mL (ref 30.00–100.00)

## 2016-08-22 LAB — LIPID PANEL
CHOL/HDL RATIO: 6
Cholesterol: 228 mg/dL — ABNORMAL HIGH (ref 0–200)
HDL: 35.5 mg/dL — AB (ref >39.00)
LDL Cholesterol: 159 mg/dL — ABNORMAL HIGH (ref 0–99)
NONHDL: 192.64
Triglycerides: 170 mg/dL — ABNORMAL HIGH (ref 0.0–149.0)
VLDL: 34 mg/dL (ref 0.0–40.0)

## 2016-08-22 LAB — CBC WITH DIFFERENTIAL/PLATELET
BASOS PCT: 0.5 % (ref 0.0–3.0)
Basophils Absolute: 0 10*3/uL (ref 0.0–0.1)
EOS ABS: 0.1 10*3/uL (ref 0.0–0.7)
Eosinophils Relative: 1.4 % (ref 0.0–5.0)
HEMATOCRIT: 40.7 % (ref 36.0–46.0)
Hemoglobin: 13.7 g/dL (ref 12.0–15.0)
LYMPHS PCT: 20.2 % (ref 12.0–46.0)
Lymphs Abs: 1.8 10*3/uL (ref 0.7–4.0)
MCHC: 33.6 g/dL (ref 30.0–36.0)
MCV: 86.2 fl (ref 78.0–100.0)
Monocytes Absolute: 1 10*3/uL (ref 0.1–1.0)
Monocytes Relative: 10.8 % (ref 3.0–12.0)
NEUTROS PCT: 67.1 % (ref 43.0–77.0)
Neutro Abs: 6.1 10*3/uL (ref 1.4–7.7)
PLATELETS: 256 10*3/uL (ref 150.0–400.0)
RBC: 4.72 Mil/uL (ref 3.87–5.11)
RDW: 14.9 % (ref 11.5–15.5)
WBC: 9.1 10*3/uL (ref 4.0–10.5)

## 2016-08-22 LAB — COMPREHENSIVE METABOLIC PANEL
ALT: 23 U/L (ref 0–35)
AST: 25 U/L (ref 0–37)
Albumin: 4.1 g/dL (ref 3.5–5.2)
Alkaline Phosphatase: 77 U/L (ref 39–117)
BUN: 13 mg/dL (ref 6–23)
CHLORIDE: 108 meq/L (ref 96–112)
CO2: 26 meq/L (ref 19–32)
Calcium: 9.9 mg/dL (ref 8.4–10.5)
Creatinine, Ser: 0.73 mg/dL (ref 0.40–1.20)
GFR: 88.97 mL/min (ref >60.00)
GLUCOSE: 68 mg/dL — AB (ref 70–99)
POTASSIUM: 4.2 meq/L (ref 3.5–5.1)
Sodium: 141 mEq/L (ref 135–145)
Total Bilirubin: 0.7 mg/dL (ref 0.2–1.2)
Total Protein: 6.9 g/dL (ref 6.0–8.3)

## 2016-08-22 LAB — VITAMIN B12: Vitamin B-12: 1173 pg/mL — ABNORMAL HIGH (ref 211–911)

## 2016-08-22 LAB — TSH: TSH: 0.83 u[IU]/mL (ref 0.35–4.50)

## 2016-08-22 MED ORDER — METOPROLOL TARTRATE 25 MG PO TABS: 12.5000 mg | ORAL_TABLET | Freq: Two times a day (BID) | ORAL | 3 refills | Status: DC

## 2016-08-22 NOTE — Patient Instructions (Signed)
Great to see you.  Happy Birthday!  Please decrease your metoprolol to 12.5 mg twice daily.  Please come see me in 1 month.  We will call you with your lab results and you can view them online.

## 2016-08-22 NOTE — Progress Notes (Signed)
Pre visit review using our clinic review tool, if applicable. No additional management support is needed unless otherwise documented below in the visit note. 

## 2016-08-22 NOTE — Progress Notes (Signed)
Subjective:   Patient ID: Cassandra Mcconnell, female    DOB: April 04, 1966, 51 y.o.   MRN: [id number]  Cassandra Mcconnell is a pleasant 52 y.o. year old female who presents to clinic today with Anxiety and Fatigue  on 08/22/2016  HPI:  Fatigue/dizziness- currently taking lopressor 25 mg twice daily. Symptoms on going for 3- 4 months.  Losing weight- has s/p bariatric surgery. Has already lost 50 pounds.  Dizziness typically occurs when she stands from a seated position.   Wt Readings from Last 3 Encounters:  08/22/16 202 lb (91.6 kg)  08/16/16 204 lb 3.2 oz (92.6 kg)  08/10/16 204 lb 3.2 oz (92.6 kg)    Also has felt more depressed lately and she is not sure if it's from the fatigue or if her depression is causing her to feel fatigued. Not sleeping well.  Taking celexa 40 mg daily.  Denies anxiety.  Denies SI or HI.  Son has been having more psych issues so she is under more stress lately.  Lab Results  Component Value Date   WBC 8.8 04/21/2016   HGB 14.5 04/21/2016   HCT 43.8 04/21/2016   MCV 83.3 04/21/2016   PLT 250 04/21/2016   No results found for: VITAMINB12  Anxiety/depression-     Current Outpatient Prescriptions on File Prior to Visit  Medication Sig Dispense Refill  . acetaminophen (TYLENOL) 500 MG tablet Take 1,000 mg by mouth every 6 (six) hours as needed. For pain     . Biotin 1 MG CAPS Take by mouth.    . Calcium 600-200 MG-UNIT tablet Take 1 tablet by mouth daily.    . citalopram (CELEXA) 20 MG tablet TAKE 1 TABLET TWICE DAILY 180 tablet 0  . cyanocobalamin 1000 MCG tablet Take 1,000 mcg by mouth daily.    Marland Kitchen docusate sodium (COLACE) 100 MG capsule Take 100 mg by mouth daily as needed for mild constipation.    . ferrous sulfate 325 (65 FE) MG EC tablet Take 325 mg by mouth 3 (three) times daily with meals.    . gabapentin (NEURONTIN) 100 MG capsule Take 1 capsule (100 mg total) by mouth at bedtime. Every hs and prn if needed 45 capsule 3  . Multiple  Vitamin (MULTIVITAMIN) capsule Take 1 capsule by mouth daily.    . pantoprazole (PROTONIX) 40 MG tablet Take 40 mg by mouth daily.    . polyvinyl alcohol-povidone (REFRESH) 1.4-0.6 % ophthalmic solution 1-2 drops as needed.    . promethazine (PHENERGAN) 25 MG tablet 25 mg as needed.    . thiamine (VITAMIN B-1) 100 MG tablet Take 100 mg by mouth daily.    Marland Kitchen triamcinolone cream (KENALOG) 0.1 % APPLY TO AFFECTED AREA TWICE DAILY. 30 g 0   No current facility-administered medications on file prior to visit.     Allergies  Allergen Reactions  . Penicillins Shortness Of Breath    Pt tolerated ceftriaxone given in ED 10/12/14  . Zofran [Ondansetron Hcl] Shortness Of Breath    Numbness,  . Azithromycin Swelling  . Cimetidine Other (See Comments)    unknown  . Conjugated Estrogens Other (See Comments)    emotional  . Cyclobenzaprine Hcl     REACTION: disorientation  . Dicyclomine Hcl Other (See Comments)    unknown  . Flagyl [Metronidazole] Nausea Only  . Lisinopril     REACTION: RASH AND MOUTH SORE  . Norco [Hydrocodone-Acetaminophen] Other (See Comments)    Felt like she was going through withdrawal  .  Nsaids Other (See Comments)    Cannot have because of bariatric surgery  . Paroxetine Other (See Comments)    Unknown  . Percocet [Oxycodone-Acetaminophen]     Hypotension   . Rocephin [Ceftriaxone] Nausea Only  . Rosuvastatin Dermatitis  . Sulfa Antibiotics Hives and Itching  . Wellbutrin [Bupropion Hcl] Other (See Comments)    Emotional, makes depression worse  . Ciprofloxacin Itching and Rash  . Doxycycline Hyclate Itching and Rash  . Erythromycin Base Itching and Rash  . Prednisone Rash    Past Medical History:  Diagnosis Date  . Anxiety   . Back pain   . Bell's palsy    Left side, 2009, 2017  . Depression   . Diverticulitis   . GERD (gastroesophageal reflux disease)   . History of hiatal hernia   . Hyperlipidemia   . Hypertension   . Postoperative nausea  04/21/2016  . Wears glasses     Past Surgical History:  Procedure Laterality Date  . BACK SURGERY  2004   l4/5 fusion  . BREAST REDUCTION SURGERY Bilateral 08/08/2014   Procedure: BILATERAL BREAST REDUCTION  (BREAST);  Surgeon: Irene Limbo, MD;  Location: [place];  Service: Plastics;  Laterality: Bilateral;  . CHOLECYSTECTOMY     1998  . LAPAROSCOPIC BILATERAL SALPINGO OOPHERECTOMY Bilateral 03/11/2015   Procedure: LAPAROSCOPIC BILATERAL SALPINGO OOPHORECTOMY;  Surgeon: Florian Buff, MD;  Location: [facility];  Service: Gynecology;  Laterality: Bilateral;  . LAPAROSCOPIC GASTRIC SLEEVE RESECTION WITH HIATAL HERNIA REPAIR N/A 03/22/2016   Procedure: LAPAROSCOPIC GASTRIC SLEEVE RESECTION WITH HIATAL HERNIA REPAIR WITHUPPER ENDOSCOPY;  Surgeon: Alphonsa Overall, MD;  Location[facility];  Service: General;  Laterality: N/A;  . SEPTOPLASTY  1988  . tonsillectomy  1991  . TONSILLECTOMY      Family History  Problem Relation Age of Onset  . COPD Mother   . Hypertension Mother   . GER disease Mother   . Heart attack Father   . Cancer Father     prostate  . Hyperlipidemia Brother   . Hypertension Brother   . Diabetes Brother   . Neuropathy Brother   . Hyperlipidemia Brother   . Hypertension Brother   . Heart attack Paternal Grandmother   . Heart attack Paternal Grandfather   . Stroke Paternal Grandfather     Social History   Social History  . Marital status: Married    Spouse name: N/A  . Number of children: 1  . Years of education: N/A   Occupational History  . stay at home mom    Social History Main Topics  . Smoking status: Never Smoker  . Smokeless tobacco: Never Used  . Alcohol use No  . Drug use: No  . Sexual activity: Not on file   Other Topics Concern  . Not on file   Social History Narrative  . No narrative on file   The PMH, PSH, Social History, Family History, Medications, and allergies have been reviewed in [facility], and have been updated if  relevant.   Review of Systems  Constitutional: Negative.   Neurological: Positive for dizziness. Negative for tremors, seizures, syncope, facial asymmetry, speech difficulty, weakness, light-headedness, numbness and headaches.  Psychiatric/Behavioral: Positive for dysphoric mood and sleep disturbance. Negative for agitation, behavioral problems, confusion, decreased concentration, hallucinations, self-injury and suicidal ideas. The patient is not nervous/anxious and is not hyperactive.   All other systems reviewed and are negative.      Objective:    BP 102/60 (BP  Location: Left Arm, Patient Position: Sitting, Cuff Size: Large)   Pulse 63   Temp 97.4 F (36.3 C) (Oral)   Wt 202 lb (91.6 kg)   SpO2 97%   BMI 34.67 kg/m    Physical Exam   General:  Well-developed,well-nourished,in no acute distress; alert,appropriate and cooperative throughout examination Head:  normocephalic and atraumatic.   Eyes:  vision grossly intact, PERRL Ears:  R ear normal and L ear normal externally, TMs clear bilaterally Nose:  no external deformity.   Mouth:  good dentition.    Lungs:  Normal respiratory effort, chest expands symmetrically. Lungs are clear to auscultation, no crackles or wheezes. Heart:  Normal rate and regular rhythm. S1 and S2 normal without gallop, murmur, click, rub or other extra sounds. Msk:  No deformity or scoliosis noted of thoracic or lumbar spine.   Extremities:  No clubbing, cyanosis, edema, or deformity noted with normal full range of motion of all joints.   Neurologic:  alert & oriented X3 and gait normal.   Skin:  Intact without suspicious lesions or rashes Psych:  Cognition and judgment appear intact. Alert and cooperative with normal attention span and concentration. No apparent delusions, illusions, hallucinations       Assessment & Plan:   Depression, unspecified depression type  HYPERTENSION, BENIGN ESSENTIAL  Other fatigue - Plan: CBC with  Differential/Platelet, Vitamin B12, Vitamin D, 25-hydroxy, TSH, Lipid panel, Comprehensive metabolic panel No Follow-up on file.

## 2016-08-22 NOTE — Assessment & Plan Note (Signed)
Deteriorated and likely multifactorial. Will check labs today given h/o bariatric surgery- may have some anemia or vit deficiencies. Pt agreed to await changing antidepressants until labs returned. Also having some symptoms of orthostasis so we are decreasing her metoprolol to 12.5 mg twice daily as this can also add to fatigue. Follow up in 1 month. The patient indicates understanding of these issues and agrees with the plan. Orders Placed This Encounter  Procedures  . CBC with Differential/Platelet  . Vitamin B12  . Vitamin D, 25-hydroxy  . TSH  . Lipid panel  . Comprehensive metabolic panel

## 2016-08-23 ENCOUNTER — Encounter: Payer: Self-pay | Admitting: Family Medicine

## 2016-08-23 ENCOUNTER — Telehealth: Payer: Self-pay | Admitting: Skilled Nursing Facility1

## 2016-08-23 NOTE — Telephone Encounter (Signed)
Pt states she is Feeling like crap and has been very tired. Pt states her blood pressure medicine was cut in half yeasterday. Pt states her Cholesterol is high (228). Pt is very concerned about her glucose being 68 and states that is very low for her, pt states she has a family hx of diabetes and wants to start checking her sugar so she will get a meter from her doctor. Pt states she has not had her mental health medications managed since surgery.  Dietitian advised she switch to low fat cheese but pt states she does not want to do that because she loves her cheese so dietitian advised she cut back on her cheese consumption from 3 times a day to 1 time a day. Dietitian asked if her B complex has thiamin and it did not so pt got a thiamin supplement with at least 12 mg. Dietitian advised she have 1 serving of a energy food with each meal to help with her glucose. Dietitian advised she stay hydrated every day which could help with her energy level. Dietitian advised she see her doctor to talk about possibly adjusting her mental health medications.

## 2016-08-24 ENCOUNTER — Encounter: Payer: Self-pay | Admitting: Family Medicine

## 2016-08-26 ENCOUNTER — Encounter: Payer: Self-pay | Admitting: Family Medicine

## 2016-08-29 ENCOUNTER — Emergency Department (HOSPITAL_COMMUNITY)
Admission: EM | Admit: 2016-08-29 | Discharge: 2016-08-29 | Disposition: A | Discharge status: Home / Self Care | Payer: Medicare HMO | Attending: Emergency Medicine | Admitting: Emergency Medicine

## 2016-08-29 ENCOUNTER — Emergency Department (HOSPITAL_COMMUNITY): Payer: Medicare HMO

## 2016-08-29 ENCOUNTER — Encounter (HOSPITAL_COMMUNITY): Payer: Self-pay | Admitting: Cardiology

## 2016-08-29 ENCOUNTER — Ambulatory Visit: Payer: Self-pay | Admitting: Family Medicine

## 2016-08-29 DIAGNOSIS — R109 Unspecified abdominal pain: Secondary | ICD-10-CM | POA: Diagnosis not present

## 2016-08-29 DIAGNOSIS — R197 Diarrhea, unspecified: Secondary | ICD-10-CM | POA: Diagnosis not present

## 2016-08-29 DIAGNOSIS — I10 Essential (primary) hypertension: Secondary | ICD-10-CM | POA: Insufficient documentation

## 2016-08-29 DIAGNOSIS — Z79899 Other long term (current) drug therapy: Secondary | ICD-10-CM | POA: Diagnosis not present

## 2016-08-29 DIAGNOSIS — R1032 Left lower quadrant pain: Secondary | ICD-10-CM | POA: Diagnosis present

## 2016-08-29 DIAGNOSIS — K5730 Diverticulosis of large intestine without perforation or abscess without bleeding: Secondary | ICD-10-CM | POA: Insufficient documentation

## 2016-08-29 DIAGNOSIS — K5732 Diverticulitis of large intestine without perforation or abscess without bleeding: Secondary | ICD-10-CM

## 2016-08-29 LAB — URINALYSIS, ROUTINE W REFLEX MICROSCOPIC
BILIRUBIN URINE: NEGATIVE
Glucose, UA: NEGATIVE mg/dL
Hgb urine dipstick: NEGATIVE
KETONES UR: 5 mg/dL — AB
Nitrite: NEGATIVE
PROTEIN: 30 mg/dL — AB
SPECIFIC GRAVITY, URINE: 1.028 (ref 1.005–1.030)
pH: 6 (ref 5.0–8.0)

## 2016-08-29 LAB — CBC WITH DIFFERENTIAL/PLATELET
Basophils Absolute: 0 10*3/uL (ref 0.0–0.1)
Basophils Relative: 0 %
EOS PCT: 1 %
Eosinophils Absolute: 0.1 10*3/uL (ref 0.0–0.7)
HCT: 38.2 % (ref 36.0–46.0)
Hemoglobin: 13.1 g/dL (ref 12.0–15.0)
LYMPHS ABS: 2 10*3/uL (ref 0.7–4.0)
LYMPHS PCT: 20 %
MCH: 29.8 pg (ref 26.0–34.0)
MCHC: 34.3 g/dL (ref 30.0–36.0)
MCV: 86.8 fL (ref 78.0–100.0)
MONO ABS: 1 10*3/uL (ref 0.1–1.0)
MONOS PCT: 10 %
Neutro Abs: 7.2 10*3/uL (ref 1.7–7.7)
Neutrophils Relative %: 69 %
PLATELETS: 266 10*3/uL (ref 150–400)
RBC: 4.4 MIL/uL (ref 3.87–5.11)
RDW: 13.9 % (ref 11.5–15.5)
WBC: 10.3 10*3/uL (ref 4.0–10.5)

## 2016-08-29 LAB — COMPREHENSIVE METABOLIC PANEL
ALT: 22 U/L (ref 14–54)
AST: 30 U/L (ref 15–41)
Albumin: 3.9 g/dL (ref 3.5–5.0)
Alkaline Phosphatase: 80 U/L (ref 38–126)
Anion gap: 9 (ref 5–15)
BUN: 11 mg/dL (ref 6–20)
CHLORIDE: 106 mmol/L (ref 101–111)
CO2: 23 mmol/L (ref 22–32)
Calcium: 9.2 mg/dL (ref 8.9–10.3)
Creatinine, Ser: 0.67 mg/dL (ref 0.44–1.00)
Glucose, Bld: 89 mg/dL (ref 65–99)
Potassium: 3.5 mmol/L (ref 3.5–5.1)
Sodium: 138 mmol/L (ref 135–145)
TOTAL PROTEIN: 7.1 g/dL (ref 6.5–8.1)
Total Bilirubin: 0.8 mg/dL (ref 0.3–1.2)

## 2016-08-29 LAB — LIPASE, BLOOD: Lipase: 27 U/L (ref 11–51)

## 2016-08-29 MED ORDER — MOXIFLOXACIN HCL 400 MG PO TABS: 400.0000 mg | ORAL_TABLET | Freq: Every day | ORAL | 0 refills | Status: DC

## 2016-08-29 MED ORDER — PROMETHAZINE HCL 25 MG PO TABS: 25.0000 mg | ORAL_TABLET | Freq: Four times a day (QID) | ORAL | 1 refills | Status: DC | PRN

## 2016-08-29 MED ORDER — PROMETHAZINE HCL 25 MG/ML IJ SOLN
12.5000 mg | Freq: Once | INTRAMUSCULAR | Status: AC
  Administered 2016-08-29: 12.5 mg via INTRAVENOUS
  Filled 2016-08-29: qty 1

## 2016-08-29 MED ORDER — MORPHINE SULFATE (PF) 2 MG/ML IV SOLN
2.0000 mg | Freq: Once | INTRAVENOUS | Status: AC
  Administered 2016-08-29: 2 mg via INTRAVENOUS
  Filled 2016-08-29: qty 1

## 2016-08-29 MED ORDER — SODIUM CHLORIDE 0.9 % IV BOLUS (SEPSIS)
500.0000 mL | Freq: Once | INTRAVENOUS | Status: AC
  Administered 2016-08-29: 500 mL via INTRAVENOUS

## 2016-08-29 MED ORDER — SODIUM CHLORIDE 0.9 % IV SOLN: INTRAVENOUS | Status: DC

## 2016-08-29 MED ORDER — IOPAMIDOL (ISOVUE-300) INJECTION 61%
100.0000 mL | Freq: Once | INTRAVENOUS | Status: AC | PRN
  Administered 2016-08-29: 100 mL via INTRAVENOUS

## 2016-08-29 NOTE — ED Notes (Signed)
Patient requesting more pain medication, EDP made aware.  

## 2016-08-29 NOTE — Discharge Instructions (Signed)
Take antibiotic as directed. Return for any new or worse symptoms. Take the Phenergan as directed for nausea.  Return for any new or worse symptoms or if not improving in 2-4 days. Make appointment to follow-up with your doctor.

## 2016-08-29 NOTE — ED Provider Notes (Signed)
[facility] DEPT Provider Note   CSN: [id number] Arrival date & time: 08/29/16  1514     History   Chief Complaint Chief Complaint  Patient presents with  . Abdominal Pain    HPI Cassandra Mcconnell is a 51 y.o. female.  Patient with complaint of suprapubic lower quadrant abdominal pain for one week. Patient had both ovaries removed in 2016. Patient had a history of diverticulitis in the past. And also has active Bell's palsy is been going on for months with weakness to the left side of face. No fevers. Associated with nausea no vomiting no diarrhea. No dysuria.      Past Medical History:  Diagnosis Date  . Anxiety   . Back pain   . Bell's palsy    Left side, 2009, 2017  . Depression   . Diverticulitis   . GERD (gastroesophageal reflux disease)   . History of hiatal hernia   . Hyperlipidemia   . Hypertension   . Postoperative nausea 04/21/2016  . Wears glasses     Patient Active Problem List   Diagnosis Date Noted  . Fatigue 08/22/2016  . Postoperative nausea 04/21/2016  . Onychomycosis 10/15/2015  . Bell's palsy 09/29/2015  . Cutaneous skin tags 02/18/2015  . Complex ovarian cyst 10/15/2014  . Diverticulitis 10/12/2014  . Severe obesity (BMI >= 40) ([place]) 05/26/2014  . Depression 05/31/2013  . Morbid obesity ([place]) 12/04/2009  . 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 HYPERTROPHY, BREAST 11/20/2006  . Anxiety state 11/10/2006  . GERD 11/10/2006  . HYPERCHOLESTEROLEMIA, PURE 11/09/2006  . HYPERTENSION, BENIGN ESSENTIAL 11/09/2006  . CHOLECYSTECTOMY, HX OF 11/09/2006    Past Surgical History:  Procedure Laterality Date  . BACK SURGERY  2004   l4/5 fusion  . BREAST REDUCTION SURGERY Bilateral 08/08/2014   Procedure: BILATERAL BREAST REDUCTION  (BREAST);  Surgeon: Irene Limbo, MD;  Location: [facility];  Service: Plastics;  Laterality: Bilateral;  . CHOLECYSTECTOMY     1998  . LAPAROSCOPIC BILATERAL SALPINGO OOPHERECTOMY Bilateral  03/11/2015   Procedure: LAPAROSCOPIC BILATERAL SALPINGO OOPHORECTOMY;  Surgeon: Florian Buff, MD;  Location: [facility];  Service: Gynecology;  Laterality: Bilateral;  . LAPAROSCOPIC GASTRIC SLEEVE RESECTION WITH HIATAL HERNIA REPAIR N/A 03/22/2016   Procedure: LAPAROSCOPIC GASTRIC SLEEVE RESECTION WITH HIATAL HERNIA REPAIR WITHUPPER ENDOSCOPY;  Surgeon: Alphonsa Overall, MD;  Location: [facility];  Service: General;  Laterality: N/A;  . SEPTOPLASTY  1988  . tonsillectomy  1991  . TONSILLECTOMY      OB History    No data available       Home Medications    Prior to Admission medications   Medication Sig Start Date End Date Taking? Authorizing Provider  acetaminophen (TYLENOL) 500 MG tablet Take 1,000 mg by mouth every 6 (six) hours as needed. For pain    Yes [provider]  Biotin 1 MG CAPS Take by mouth.   Yes [provider]  Calcium 600-200 MG-UNIT tablet Take 1 tablet by mouth daily.   Yes [provider]  citalopram (CELEXA) 20 MG tablet TAKE 1 TABLET TWICE DAILY 08/11/16  Yes Lucille Passy, MD  cyanocobalamin 1000 MCG tablet Take 1,000 mcg by mouth daily.   Yes [provider]  docusate sodium (COLACE) 100 MG capsule Take 100-200 mg by mouth daily.    Yes [provider]  ferrous sulfate 325 (65 FE) MG EC tablet Take 325 mg by mouth daily.    Yes [provider]  Melatonin  2.5 MG CAPS Take 1 capsule by mouth at bedtime as needed and may repeat dose one time if needed (for sleep).    Yes [provider]  Multiple Vitamin (MULTIVITAMIN) capsule Take 1 capsule by mouth daily.   Yes [provider]  pantoprazole (PROTONIX) 40 MG tablet Take 40 mg by mouth daily. 04/19/16  Yes [provider]  polyvinyl alcohol-povidone (REFRESH) 1.4-0.6 % ophthalmic solution Place 1-2 drops into both eyes daily as needed (for dry eye relief).    Yes [provider]  promethazine (PHENERGAN) 25 MG tablet Take 25 mg by mouth  every 8 (eight) hours as needed for nausea or vomiting.  06/27/16  Yes [provider]  thiamine (VITAMIN B-1) 100 MG tablet Take 100 mg by mouth daily.   Yes [provider]  triamcinolone cream (KENALOG) 0.1 % APPLY TO AFFECTED AREA TWICE DAILY. 04/19/16  Yes Lucille Passy, MD  gabapentin (NEURONTIN) 100 MG capsule Take 1 capsule (100 mg total) by mouth at bedtime. Every hs and prn if needed 08/18/16   Dennie Bible, NP  metoprolol tartrate (LOPRESSOR) 25 MG tablet Take 0.5 tablets (12.5 mg total) by mouth 2 (two) times daily. Patient not taking: Reported on 08/29/2016 08/22/16   Lucille Passy, MD  moxifloxacin (AVELOX) 400 MG tablet Take 1 tablet (400 mg total) by mouth daily at 8 pm. 08/29/16   Fredia Sorrow, MD  simvastatin (ZOCOR) 20 MG tablet Take 20 mg by mouth daily. 08/06/16   [provider]    Family History Family History  Problem Relation Age of Onset  . COPD Mother   . Hypertension Mother   . GER disease Mother   . Heart attack Father   . Cancer Father     prostate  . Hyperlipidemia Brother   . Hypertension Brother   . Diabetes Brother   . Neuropathy Brother   . Hyperlipidemia Brother   . Hypertension Brother   . Heart attack Paternal Grandmother   . Heart attack Paternal Grandfather   . Stroke Paternal Grandfather     Social History Social History  Substance Use Topics  . Smoking status: Never Smoker  . Smokeless tobacco: Never Used  . Alcohol use No     Allergies   Penicillins; Zofran [ondansetron hcl]; Azithromycin; Cimetidine; Conjugated estrogens; Cyclobenzaprine hcl; Dicyclomine hcl; Flagyl [metronidazole]; Lisinopril; Norco [hydrocodone-acetaminophen]; Nsaids; Paroxetine; Percocet [oxycodone-acetaminophen]; Rocephin [ceftriaxone]; Rosuvastatin; Sulfa antibiotics; Wellbutrin [bupropion hcl]; Ciprofloxacin; Doxycycline hyclate; Erythromycin base; and Prednisone   Review of Systems Review of Systems  Constitutional: Negative  for fever.  HENT: Negative for congestion.   Eyes: Negative for redness.  Respiratory: Negative for shortness of breath.   Cardiovascular: Negative for chest pain.  Gastrointestinal: Positive for abdominal pain and nausea. Negative for diarrhea and vomiting.  Genitourinary: Negative for dysuria.  Musculoskeletal: Negative for back pain.  Skin: Negative for rash.  Neurological: Positive for facial asymmetry. Negative for headaches.  Hematological: Does not bruise/bleed easily.  Psychiatric/Behavioral: Negative for confusion.     Physical Exam Updated Vital Signs BP 116/69   Pulse 65   Temp 97.8 F (36.6 C) (Oral)   Resp 20   Ht 5\' 4"  (1.626 m)   Wt 91.2 kg   SpO2 99%   BMI 34.50 kg/m   Physical Exam  Constitutional: She is oriented to person, place, and time. She appears well-developed and well-nourished. No distress.  HENT:  Head: Normocephalic and atraumatic.  Mouth/Throat: Oropharynx is clear and moist.  Eyes:  Conjunctivae and EOM are normal. Pupils are equal, round, and reactive to light.  Neck: Normal range of motion. Neck supple.  Cardiovascular: Normal rate, regular rhythm and normal heart sounds.   Pulmonary/Chest: Effort normal and breath sounds normal.  Abdominal: Soft. Bowel sounds are normal. There is tenderness. There is no guarding.  Tenderness lower quadrants.  Musculoskeletal: Normal range of motion.  Neurological: She is alert and oriented to person, place, and time. No cranial nerve deficit or sensory deficit. She exhibits normal muscle tone. Coordination normal.  Except for a persistent weakness to the left side of the face. Patient able to close her eye.  Skin: Skin is warm.  Nursing note and vitals reviewed.    ED Treatments / Results  Labs (all labs ordered are listed, but only abnormal results are displayed) Labs Reviewed  URINALYSIS, ROUTINE W REFLEX MICROSCOPIC - Abnormal; Notable for the following:       Result Value   Color, Urine AMBER  (*)    APPearance HAZY (*)    Ketones, ur 5 (*)    Protein, ur 30 (*)    Leukocytes, UA MODERATE (*)    Bacteria, UA RARE (*)    Squamous Epithelial / LPF 6-30 (*)    All other components within normal limits  URINE CULTURE  CBC WITH DIFFERENTIAL/PLATELET  COMPREHENSIVE METABOLIC PANEL  LIPASE, BLOOD    EKG  EKG Interpretation None       Radiology Ct Abdomen Pelvis W Contrast  Result Date: 08/29/2016 CLINICAL DATA:  Lower abdominal pain.  Diarrhea and nausea. EXAM: CT ABDOMEN AND PELVIS WITH CONTRAST TECHNIQUE: Multidetector CT imaging of the abdomen and pelvis was performed using the standard protocol following bolus administration of intravenous contrast. CONTRAST:  126mL ISOVUE-300 IOPAMIDOL (ISOVUE-300) INJECTION 61% COMPARISON:  CT abdomen pelvis 04/21/2016 FINDINGS: Lower chest: No pulmonary nodules. No visible pleural or pericardial effusion. Hepatobiliary: Normal hepatic size and contours without focal liver lesion. No perihepatic ascites. No intra- or extrahepatic biliary dilatation. Status post cholecystectomy. Pancreas: Normal pancreatic contours and enhancement. No peripancreatic fluid collection or pancreatic ductal dilatation. Spleen: Normal. Adrenals/Urinary Tract: Normal adrenal glands. No hydronephrosis or solid renal mass. Stomach/Bowel: Status post sleeve gastrectomy. Normal duodenum. Patulous distal esophagus. There is diffuse sigmoid colon diverticulosis with associated wall thickening and surrounding inflammatory stranding. There is a small amount of adjacent free fluid but no organized collection. There is no free intraperitoneal air. Normal appendix. Vascular/Lymphatic: There is atherosclerotic calcification of the non aneurysmal abdominal aorta. No abdominal or pelvic adenopathy. Reproductive: The uterus is normal.  No adnexal mass. Musculoskeletal: L4-L5 posterior spinal fusion. Normal visualized extrathoracic and extraperitoneal soft tissues. Other: No contributory  non-categorized findings. IMPRESSION: 1. Acute sigmoid diverticulitis with adjacent free fluid but no evidence of abscess formation or perforation. 2. Aortic atherosclerosis. 3. Patulous distal esophagus. Electronically Signed   By: Ulyses Jarred M.D.   On: 08/29/2016 19:19    Procedures Procedures (including critical care time)  Medications Ordered in ED Medications  0.9 %  sodium chloride infusion (not administered)  sodium chloride 0.9 % bolus 500 mL (0 mLs Intravenous Stopped 08/29/16 1745)  morphine 2 MG/ML injection 2 mg (2 mg Intravenous Given 08/29/16 1605)  promethazine (PHENERGAN) injection 12.5 mg (12.5 mg Intravenous Given 08/29/16 1605)  morphine 2 MG/ML injection 2 mg (2 mg Intravenous Given 08/29/16 1824)  iopamidol (ISOVUE-300) 61 % injection 100 mL (100 mLs Intravenous Contrast Given 08/29/16 1855)     Initial Impression / Assessment and Plan /  ED Course  I have reviewed the triage vital signs and the nursing notes.  Pertinent labs & imaging results that were available during my care of the patient were reviewed by me and considered in my medical decision making (see chart for details).     Patient nontoxic no acute distress. CT scan not consistent with diverticulitis sigmoid colon without any complicating factors. Patient has a broad the antibiotic allergy profile. But has been on moxifloxacin for diverticulitis in the past and done fine. Patient unable to take Cipro or Flagyl. Will be given oral moxifloxacin. Patient will follow-up with her doctor she'll return if not improving over the next few days. Phenergan provided for nausea.  Final Clinical Impressions(s) / ED Diagnoses   Final diagnoses:  Diverticulitis of large intestine without perforation or abscess without bleeding    New Prescriptions New Prescriptions   MOXIFLOXACIN (AVELOX) 400 MG TABLET    Take 1 tablet (400 mg total) by mouth daily at 8 pm.     Fredia Sorrow, MD 08/29/16 [phone]

## 2016-08-29 NOTE — ED Triage Notes (Signed)
Lower abdominal pain times one week.  Sob today.  Diarrhea.  Nausea.

## 2016-08-31 LAB — URINE CULTURE

## 2016-09-01 ENCOUNTER — Telehealth: Payer: Self-pay | Admitting: Registered"

## 2016-09-01 NOTE — Progress Notes (Signed)
Personally  participated in, made any corrections needed, and agree with history, physical, neuro exam,assessment and plan as stated above.    Antonia Ahern, MD [facility] 

## 2016-09-01 NOTE — Telephone Encounter (Signed)
Pt called on 08/30/2016 due to having an episode with diverticulitis. Pt states she had been to the ER the night before and wanted to know what she could eat/drink. Pt was given some nutrition information and asked if she would like to pick up the information I had for her or if she'd prefer I continue to read the list of acceptable food and drink options that covers about 3 pages of information. Pt informs me that I'm not being helpful, she's not driving from [place] to pick up papers, and she's still on medications that prohibit her from driving. The phone line disconnects before I could see what would work best for her situation.

## 2016-09-02 DIAGNOSIS — G510 Bell's palsy: Secondary | ICD-10-CM | POA: Diagnosis not present

## 2016-09-02 DIAGNOSIS — Z903 Acquired absence of stomach [part of]: Secondary | ICD-10-CM | POA: Diagnosis not present

## 2016-09-02 DIAGNOSIS — K219 Gastro-esophageal reflux disease without esophagitis: Secondary | ICD-10-CM | POA: Diagnosis not present

## 2016-09-02 DIAGNOSIS — K5792 Diverticulitis of intestine, part unspecified, without perforation or abscess without bleeding: Secondary | ICD-10-CM | POA: Diagnosis not present

## 2016-09-02 DIAGNOSIS — K449 Diaphragmatic hernia without obstruction or gangrene: Secondary | ICD-10-CM | POA: Diagnosis not present

## 2016-09-12 ENCOUNTER — Ambulatory Visit (INDEPENDENT_AMBULATORY_CARE_PROVIDER_SITE_OTHER): Payer: Medicare HMO | Admitting: Family Medicine

## 2016-09-12 ENCOUNTER — Encounter: Payer: Self-pay | Admitting: Family Medicine

## 2016-09-12 DIAGNOSIS — I951 Orthostatic hypotension: Secondary | ICD-10-CM | POA: Diagnosis not present

## 2016-09-12 DIAGNOSIS — I959 Hypotension, unspecified: Secondary | ICD-10-CM | POA: Insufficient documentation

## 2016-09-12 NOTE — Progress Notes (Signed)
Subjective:   Patient ID: Cassandra Mcconnell, female    DOB: Jul 06, 1965, 51 y.o.   MRN: [id number]  Cassandra Mcconnell is a pleasant 51 y.o. year old female who presents to clinic today with Hypertension  on 09/12/2016  HPI:  Here for two week follow up.  Saw Dr. Lucia Gaskins for recurrent diverticulitis after ER visit on 08/29/16.  Reviewed.  Has been hypotensive and bradycardic intermittently. Continues to do well with her weight loss. Wt Readings from Last 3 Encounters:  09/12/16 199 lb (90.3 kg)  08/29/16 201 lb (91.2 kg)  08/22/16 202 lb (91.6 kg)   He therefore advised her to stop taking her lopressor and follow up with me here today.  She has been normotensive and without palpitations since stopping lopressor.  Current Outpatient Prescriptions on File Prior to Visit  Medication Sig Dispense Refill  . acetaminophen (TYLENOL) 500 MG tablet Take 1,000 mg by mouth every 6 (six) hours as needed. For pain     . Biotin 1 MG CAPS Take by mouth.    . Calcium 600-200 MG-UNIT tablet Take 1 tablet by mouth daily.    . citalopram (CELEXA) 20 MG tablet TAKE 1 TABLET TWICE DAILY 180 tablet 0  . cyanocobalamin 1000 MCG tablet Take 1,000 mcg by mouth daily.    Marland Kitchen docusate sodium (COLACE) 100 MG capsule Take 100-200 mg by mouth daily.     . ferrous sulfate 325 (65 FE) MG EC tablet Take 325 mg by mouth daily.     Marland Kitchen gabapentin (NEURONTIN) 100 MG capsule Take 1 capsule (100 mg total) by mouth at bedtime. Every hs and prn if needed 45 capsule 3  . Melatonin 2.5 MG CAPS Take 1 capsule by mouth at bedtime as needed and may repeat dose one time if needed (for sleep).     . Multiple Vitamin (MULTIVITAMIN) capsule Take 1 capsule by mouth daily.    . pantoprazole (PROTONIX) 40 MG tablet Take 40 mg by mouth daily.    . polyvinyl alcohol-povidone (REFRESH) 1.4-0.6 % ophthalmic solution Place 1-2 drops into both eyes daily as needed (for dry eye relief).     . promethazine (PHENERGAN) 25 MG tablet Take 25 mg by mouth  every 8 (eight) hours as needed for nausea or vomiting.     . promethazine (PHENERGAN) 25 MG tablet Take 1 tablet (25 mg total) by mouth every 6 (six) hours as needed. 12 tablet 1  . simvastatin (ZOCOR) 20 MG tablet Take 20 mg by mouth daily.    Marland Kitchen thiamine (VITAMIN B-1) 100 MG tablet Take 100 mg by mouth daily.    Marland Kitchen triamcinolone cream (KENALOG) 0.1 % APPLY TO AFFECTED AREA TWICE DAILY. 30 g 0   No current facility-administered medications on file prior to visit.     Allergies  Allergen Reactions  . Penicillins Shortness Of Breath    Pt tolerated ceftriaxone given in ED 10/12/14  . Zofran [Ondansetron Hcl] Shortness Of Breath    Numbness,  . Azithromycin Swelling  . Cimetidine Other (See Comments)    unknown  . Conjugated Estrogens Other (See Comments)    emotional  . Cyclobenzaprine Hcl     REACTION: disorientation  . Dicyclomine Hcl Other (See Comments)    unknown  . Flagyl [Metronidazole] Nausea Only  . Lisinopril     REACTION: RASH AND MOUTH SORE  . Norco [Hydrocodone-Acetaminophen] Other (See Comments)    Felt like she was going through withdrawal  . Nsaids Other (See Comments)  Cannot have because of bariatric surgery  . Paroxetine Other (See Comments)    Unknown  . Percocet [Oxycodone-Acetaminophen]     Hypotension   . Rocephin [Ceftriaxone] Nausea Only  . Rosuvastatin Dermatitis  . Sulfa Antibiotics Hives and Itching  . Wellbutrin [Bupropion Hcl] Other (See Comments)    Emotional, makes depression worse  . Ciprofloxacin Itching and Rash  . Doxycycline Hyclate Itching and Rash  . Erythromycin Base Itching and Rash  . Prednisone Rash    Past Medical History:  Diagnosis Date  . Anxiety   . Back pain   . Bell's palsy    Left side, 2009, 2017  . Depression   . Diverticulitis   . GERD (gastroesophageal reflux disease)   . History of hiatal hernia   . Hyperlipidemia   . Hypertension   . Postoperative nausea 04/21/2016  . Wears glasses     Past Surgical  History:  Procedure Laterality Date  . BACK SURGERY  2004   l4/5 fusion  . BREAST REDUCTION SURGERY Bilateral 08/08/2014   Procedure: BILATERAL BREAST REDUCTION  (BREAST);  Surgeon: Irene Limbo, MD;  Location: [facility];  Service: Plastics;  Laterality: Bilateral;  . CHOLECYSTECTOMY     1998  . LAPAROSCOPIC BILATERAL SALPINGO OOPHERECTOMY Bilateral 03/11/2015   Procedure: LAPAROSCOPIC BILATERAL SALPINGO OOPHORECTOMY;  Surgeon: Florian Buff, MD;  Location: [facility];  Service: Gynecology;  Laterality: Bilateral;  . LAPAROSCOPIC GASTRIC SLEEVE RESECTION WITH HIATAL HERNIA REPAIR N/A 03/22/2016   Procedure: LAPAROSCOPIC GASTRIC SLEEVE RESECTION WITH HIATAL HERNIA REPAIR WITHUPPER ENDOSCOPY;  Surgeon: Alphonsa Overall, MD;  Location: [facility];  Service: General;  Laterality: N/A;  . SEPTOPLASTY  1988  . tonsillectomy  1991  . TONSILLECTOMY      Family History  Problem Relation Age of Onset  . COPD Mother   . Hypertension Mother   . GER disease Mother   . Heart attack Father   . Cancer Father        prostate  . Hyperlipidemia Brother   . Hypertension Brother   . Diabetes Brother   . Neuropathy Brother   . Hyperlipidemia Brother   . Hypertension Brother   . Heart attack Paternal Grandmother   . Heart attack Paternal Grandfather   . Stroke Paternal Grandfather     Social History   Social History  . Marital status: Married    Spouse name: N/A  . Number of children: 1  . Years of education: N/A   Occupational History  . stay at home mom    Social History Main Topics  . Smoking status: Never Smoker  . Smokeless tobacco: Never Used  . Alcohol use No  . Drug use: No  . Sexual activity: Not on file   Other Topics Concern  . Not on file   Social History Narrative  . No narrative on file   The PMH, PSH, Social History, Family History, Medications, and allergies have been reviewed in [facility], and have been updated if relevant.  Review of Systems  Constitutional:  Negative.   Respiratory: Negative.   Cardiovascular: Negative.   Neurological: Negative.   Hematological: Negative.   Psychiatric/Behavioral: Negative.   All other systems reviewed and are negative.      Objective:    BP 112/78   Pulse 70   Ht 5\' 5"  (1.651 m)   Wt 199 lb (90.3 kg)   SpO2 98%   BMI 33.12 kg/m   BP Readings from Last 3 Encounters:  09/12/16  112/78  08/29/16 113/67  08/22/16 102/60    Physical Exam  Constitutional: She is oriented to person, place, and time. She appears well-developed and well-nourished. No distress.  HENT:  Head: Normocephalic and atraumatic.  Eyes: Conjunctivae are normal.  Cardiovascular: Normal rate and regular rhythm.   Pulmonary/Chest: Effort normal and breath sounds normal.  Musculoskeletal: Normal range of motion. She exhibits no edema or tenderness.  Neurological: She is alert and oriented to person, place, and time. No cranial nerve deficit. Coordination normal.  Skin: Skin is warm and dry. She is not diaphoretic.  Psychiatric: She has a normal mood and affect. Her behavior is normal. Judgment and thought content normal.  Nursing note and vitals reviewed.         Assessment & Plan:   Orthostatic hypotension No Follow-up on file.

## 2016-09-12 NOTE — Assessment & Plan Note (Signed)
Resolved since she stopped taking lopressor. Will remove from medication list. The patient indicates understanding of these issues and agrees with the plan.

## 2016-09-12 NOTE — Progress Notes (Signed)
Pre visit review using our clinic review tool, if applicable. No additional management support is needed unless otherwise documented below in the visit note. 

## 2016-09-21 ENCOUNTER — Ambulatory Visit: Payer: Commercial Managed Care - HMO | Admitting: Family Medicine

## 2016-09-22 ENCOUNTER — Telehealth: Payer: Self-pay | Admitting: Family Medicine

## 2016-09-22 ENCOUNTER — Telehealth: Payer: Self-pay | Admitting: Skilled Nursing Facility1

## 2016-09-22 DIAGNOSIS — Z1211 Encounter for screening for malignant neoplasm of colon: Secondary | ICD-10-CM

## 2016-09-22 NOTE — Telephone Encounter (Signed)
Referral placed.

## 2016-09-22 NOTE — Telephone Encounter (Signed)
Pt called to get referral for a colonoscopy. She prefers to go to [facility].

## 2016-09-22 NOTE — Telephone Encounter (Signed)
Pt called complaining of 6-7 attacks of diverticulitis. Attacks feeling like: Severe abdominal pain and nausea. Pt states her attacks are just like the ones her father used to get and he had an  Infection and was on life support. Pt states she is Never stepping foot in [place] [facility] and "making demands on me". Pt states sabrina hicks states she will let the doctor know you cussed [place] all out. Pt states she is in severe pain and she can never get in with her surgeon at [facility] [place] telling her she can not have an appointment until 6 weeks out.   Dietitian notified her PCP has put in a referral to the GI for a colonoscopy which seemed to calm down the pt. Dietitian advised she still try to keep getting her fluids in a protein shake during flair ups.

## 2016-09-26 ENCOUNTER — Encounter: Payer: Self-pay | Admitting: Nurse Practitioner

## 2016-09-27 ENCOUNTER — Ambulatory Visit (INDEPENDENT_AMBULATORY_CARE_PROVIDER_SITE_OTHER): Payer: Medicare HMO | Admitting: Psychiatry

## 2016-09-27 DIAGNOSIS — F509 Eating disorder, unspecified: Secondary | ICD-10-CM

## 2016-09-29 ENCOUNTER — Ambulatory Visit (INDEPENDENT_AMBULATORY_CARE_PROVIDER_SITE_OTHER): Payer: Medicare HMO | Admitting: Nurse Practitioner

## 2016-09-29 ENCOUNTER — Encounter: Payer: Self-pay | Admitting: Nurse Practitioner

## 2016-09-29 VITALS — BP 100/72 | HR 71 | Ht 64.0 in | Wt 199.0 lb

## 2016-09-29 DIAGNOSIS — Z1211 Encounter for screening for malignant neoplasm of colon: Secondary | ICD-10-CM | POA: Diagnosis not present

## 2016-09-29 DIAGNOSIS — K5732 Diverticulitis of large intestine without perforation or abscess without bleeding: Secondary | ICD-10-CM | POA: Diagnosis not present

## 2016-09-29 MED ORDER — NA SULFATE-K SULFATE-MG SULF 17.5-3.13-1.6 GM/177ML PO SOLN: 1.0000 | Freq: Once | ORAL | 0 refills | Status: AC

## 2016-09-29 NOTE — Patient Instructions (Signed)
If you are age 52 or older, your body mass index should be between 23-30. Your Body mass index is 34.16 kg/m. If this is out of the aforementioned range listed, please consider follow up with your Primary Care Provider.  If you are age 62 or younger, your body mass index should be between 19-25. Your Body mass index is 34.16 kg/m. If this is out of the aformentioned range listed, please consider follow up with your Primary Care Provider.   You have been scheduled for a colonoscopy. Please follow written instructions given to you at your visit today.  Please pick up your prep supplies at the pharmacy within the next 1-3 days. If you use inhalers (even only as needed), please bring them with you on the day of your procedure. Your physician has requested that you go to www.startemmi.com and enter the access code given to you at your visit today. This web site gives a general overview about your procedure. However, you should still follow specific instructions given to you by our office regarding your preparation for the procedure.   Thank you for choosing me and [facility].   Tye Savoy, NP

## 2016-09-29 NOTE — Progress Notes (Signed)
HPI: Patient is a  51 year old female with a history of a sleeve gastrectomy and hiatal hernia repair November 2017 by Dr. Lucia Gaskins. She also has a history of sigmoid diverticulitis. Patient is referred by PCP Dr. Deborra Medina for colon cancer screening.   Patient was hospitalized in 2016 with sigmoid diverticulitis with possible phlegmon . This was said to be her first episode of diverticulitis but patient tells me she actually had the same exact symptoms in 2014 and 2015 but didn't seek treatment.  Patient went to ED early May with another episode of diverticulitis. Repeat CT scan showed acute sigmoid diverticulitis without abscess. She was given 2 weeks of antibiotics and a follow up with Dr. Lucia Gaskins at [facility]. She saw Dr. Lucia Gaskins who wanted to see her again for another follow up. Her symptoms have resolved.    Patient has never had a colonoscopy. No rectal bleeding. She has chronic constipation managed nicely with stool softeners. She was started on iron in April though her hemoglobin was normal at 13.7. MCV was normal as well.   Patient has a history of GERD but asymptomatic as long as she avoids certain foods. Dr. Lucia Gaskins started her on a PPI following sleeve gastrectomy / hiatal hernia repair.   Past Medical History:  Diagnosis Date  . Anxiety   . Back pain   . Bell's palsy    Left side, 2009, 2017  . Depression   . Diverticulitis   . GERD (gastroesophageal reflux disease)   . History of hiatal hernia   . Hyperlipidemia   . Hypertension   . Postoperative nausea 04/21/2016  . Wears glasses     Past Surgical History:  Procedure Laterality Date  . BACK SURGERY  2004   l4/5 fusion  . BREAST REDUCTION SURGERY Bilateral 08/08/2014   Procedure: BILATERAL BREAST REDUCTION  (BREAST);  Surgeon: Irene Limbo, MD;  Location: [place];  Service: Plastics;  Laterality: Bilateral;  . CHOLECYSTECTOMY     1998  . LAPAROSCOPIC BILATERAL SALPINGO OOPHERECTOMY Bilateral 03/11/2015     Procedure: LAPAROSCOPIC BILATERAL SALPINGO OOPHORECTOMY;  Surgeon: Florian Buff, MD;  Location: [facility];  Service: Gynecology;  Laterality: Bilateral;  . LAPAROSCOPIC GASTRIC SLEEVE RESECTION WITH HIATAL HERNIA REPAIR N/A 03/22/2016   Procedure: LAPAROSCOPIC GASTRIC SLEEVE RESECTION WITH HIATAL HERNIA REPAIR WITHUPPER ENDOSCOPY;  Surgeon: Alphonsa Overall, MD;  Location: [facility];  Service: General;  Laterality: N/A;  . SEPTOPLASTY  1988  . tonsillectomy  1991  . TONSILLECTOMY     Family History  Problem Relation Age of Onset  . COPD Mother   . Hypertension Mother   . GER disease Mother   . Heart attack Father   . Prostate cancer Father   . Hyperlipidemia Brother   . Hypertension Brother   . Diabetes Brother   . Neuropathy Brother   . Hyperlipidemia Brother   . Hypertension Brother   . Heart attack Paternal Grandmother   . Heart attack Paternal Grandfather   . Stroke Paternal Grandfather   . Colon cancer Neg Hx   . Stomach cancer Neg Hx    Social History  Substance Use Topics  . Smoking status: Never Smoker  . Smokeless tobacco: Never Used  . Alcohol use No   Current Outpatient Prescriptions  Medication Sig Dispense Refill  . acetaminophen (TYLENOL) 500 MG tablet Take 1,000 mg by mouth every 6 (six) hours as needed. For pain     . Biotin 1 MG CAPS Take by mouth.    Marland Kitchen  Calcium 600-200 MG-UNIT tablet Take 1 tablet by mouth daily.    . citalopram (CELEXA) 20 MG tablet TAKE 1 TABLET TWICE DAILY 180 tablet 0  . cyanocobalamin 1000 MCG tablet Take 1,000 mcg by mouth daily.    Marland Kitchen docusate sodium (COLACE) 100 MG capsule Take 100-200 mg by mouth daily.     . ferrous sulfate 325 (65 FE) MG EC tablet Take 325 mg by mouth daily.     Marland Kitchen gabapentin (NEURONTIN) 100 MG capsule Take 1 capsule (100 mg total) by mouth at bedtime. Every hs and prn if needed 45 capsule 3  . Melatonin 2.5 MG CAPS Take 1 capsule by mouth at bedtime as needed and may repeat dose one time if needed (for sleep).     .  Multiple Vitamin (MULTIVITAMIN) capsule Take 1 capsule by mouth daily.    . pantoprazole (PROTONIX) 40 MG tablet Take 40 mg by mouth daily.    . polyvinyl alcohol-povidone (REFRESH) 1.4-0.6 % ophthalmic solution Place 1-2 drops into both eyes daily as needed (for dry eye relief).     . promethazine (PHENERGAN) 25 MG tablet Take 25 mg by mouth every 8 (eight) hours as needed for nausea or vomiting.     . promethazine (PHENERGAN) 25 MG tablet Take 1 tablet (25 mg total) by mouth every 6 (six) hours as needed. 12 tablet 1  . simvastatin (ZOCOR) 20 MG tablet Take 20 mg by mouth daily.    Marland Kitchen thiamine (VITAMIN B-1) 100 MG tablet Take 100 mg by mouth daily.    Marland Kitchen triamcinolone cream (KENALOG) 0.1 % APPLY TO AFFECTED AREA TWICE DAILY. 30 g 0   No current facility-administered medications for this visit.    Allergies  Allergen Reactions  . Penicillins Shortness Of Breath    Pt tolerated ceftriaxone given in ED 10/12/14  . Zofran [Ondansetron Hcl] Shortness Of Breath    Numbness,  . Azithromycin Swelling  . Cimetidine Other (See Comments)    unknown  . Conjugated Estrogens Other (See Comments)    emotional  . Cyclobenzaprine Hcl     REACTION: disorientation  . Dicyclomine Hcl Other (See Comments)    unknown  . Flagyl [Metronidazole] Nausea Only  . Lisinopril     REACTION: RASH AND MOUTH SORE  . Norco [Hydrocodone-Acetaminophen] Other (See Comments)    Felt like she was going through withdrawal  . Nsaids Other (See Comments)    Cannot have because of bariatric surgery  . Paroxetine Other (See Comments)    Unknown  . Percocet [Oxycodone-Acetaminophen]     Hypotension   . Rocephin [Ceftriaxone] Nausea Only  . Rosuvastatin Dermatitis  . Sulfa Antibiotics Hives and Itching  . Wellbutrin [Bupropion Hcl] Other (See Comments)    Emotional, makes depression worse  . Ciprofloxacin Itching and Rash  . Doxycycline Hyclate Itching and Rash  . Erythromycin Base Itching and Rash  . Prednisone Rash      Review of Systems: Positive for anxiety, arthritis, back pain, vision changes and sleeping problems All other systems reviewed and negative except where noted in HPI.    Physical Exam: BP 100/72   Pulse 71   Ht 5\' 4"  (1.626 m)   Wt 199 lb (90.3 kg)   BMI 34.16 kg/m  Constitutional:  Well-developed, white female in no acute distress. Psychiatric: Normal mood and affect. Behavior is normal. EENT: Pupils normal.  Conjunctivae are normal. No scleral icterus. Neck supple.  Cardiovascular: Normal rate, regular rhythm. No edema Pulmonary/chest: Effort normal and  breath sounds normal. No wheezing, rales or rhonchi. Abdominal: Soft, nondistended. Nontender. Bowel sounds active throughout. There are no masses palpable. No hepatomegaly. Lymphadenopathy: No cervical adenopathy noted. Neurological: Alert and oriented to person place and time. Skin: Skin is warm and dry. No rashes noted.   ASSESSMENT AND PLAN:  57. 51 year old female here for colon cancer screening. Patient will be scheduled for a screening colonoscopy with possible polypectomy.  The risks and benefits of the procedure were discussed and the patient agrees to proceed.   2. Hx of diverticulitis. Sigmoid diverticulitis on CTscan in 2016 and again in early May of this year . Symptoms resolved after 14 days of antibiotics. Feels fine now. Of note patient had same symptoms in 2014 and 2015 but didn't seek treatment.    -Colonoscopy will not be done for at least 3 more weeks to ensure complete resolution of the diverticulitis. -She is already being followed by Surgery  3. Sleeve gastrectomy and hiatal hernia repair Nov 2017   Tye Savoy, NP  09/29/2016, 11:23 AM  Cc:  Lucille Passy, MD

## 2016-09-30 NOTE — Progress Notes (Signed)
Agree with assessment and plan as outlined.  

## 2016-10-14 ENCOUNTER — Encounter: Payer: Self-pay | Admitting: Gastroenterology

## 2016-10-19 ENCOUNTER — Ambulatory Visit: Payer: Self-pay | Admitting: Skilled Nursing Facility1

## 2016-10-21 DIAGNOSIS — S83242D Other tear of medial meniscus, current injury, left knee, subsequent encounter: Secondary | ICD-10-CM | POA: Diagnosis not present

## 2016-10-27 ENCOUNTER — Telehealth: Payer: Self-pay | Admitting: Gastroenterology

## 2016-10-27 ENCOUNTER — Ambulatory Visit: Payer: Self-pay | Admitting: Skilled Nursing Facility1

## 2016-10-27 NOTE — Telephone Encounter (Signed)
Returned patients call. Patient was concerned that she could not drink the prep due to her bariatric surgery. Patient was encouraged to take more time in drinking the prep and was reassured that we often do colonoscopies on patients that have had bariatric surgery.  Patient was insistent that she was afraid that it would stretch he pouch and that she could not do it. Patient cancelled her colonoscopy.   Riki Sheer, LPN

## 2016-10-28 ENCOUNTER — Encounter: Payer: Medicare HMO | Admitting: Gastroenterology

## 2016-11-01 DIAGNOSIS — G510 Bell's palsy: Secondary | ICD-10-CM | POA: Diagnosis not present

## 2016-11-01 DIAGNOSIS — K219 Gastro-esophageal reflux disease without esophagitis: Secondary | ICD-10-CM | POA: Diagnosis not present

## 2016-11-01 DIAGNOSIS — K449 Diaphragmatic hernia without obstruction or gangrene: Secondary | ICD-10-CM | POA: Diagnosis not present

## 2016-11-01 DIAGNOSIS — Z903 Acquired absence of stomach [part of]: Secondary | ICD-10-CM | POA: Diagnosis not present

## 2016-11-01 DIAGNOSIS — K5792 Diverticulitis of intestine, part unspecified, without perforation or abscess without bleeding: Secondary | ICD-10-CM | POA: Diagnosis not present

## 2016-11-02 DIAGNOSIS — M2242 Chondromalacia patellae, left knee: Secondary | ICD-10-CM | POA: Diagnosis not present

## 2016-11-02 DIAGNOSIS — S83242A Other tear of medial meniscus, current injury, left knee, initial encounter: Secondary | ICD-10-CM | POA: Diagnosis not present

## 2016-11-08 ENCOUNTER — Other Ambulatory Visit: Payer: Self-pay | Admitting: *Deleted

## 2016-11-08 ENCOUNTER — Telehealth: Payer: Self-pay | Admitting: Nurse Practitioner

## 2016-11-08 MED ORDER — GABAPENTIN 100 MG PO CAPS: 100.0000 mg | ORAL_CAPSULE | Freq: Every day | ORAL | 3 refills | Status: DC

## 2016-11-08 NOTE — Telephone Encounter (Signed)
Gabapentin refill e scribed to [facility] as pt requested.

## 2016-11-08 NOTE — Telephone Encounter (Signed)
Patient called office requesting refill for gabapentin (NEURONTIN) 100 MG capsule.  [place] [facility].

## 2016-11-10 ENCOUNTER — Encounter: Payer: Self-pay | Admitting: Family Medicine

## 2016-11-11 ENCOUNTER — Other Ambulatory Visit: Payer: Self-pay | Admitting: *Deleted

## 2016-11-11 DIAGNOSIS — S83242D Other tear of medial meniscus, current injury, left knee, subsequent encounter: Secondary | ICD-10-CM | POA: Diagnosis not present

## 2016-11-11 MED ORDER — GABAPENTIN 100 MG PO CAPS: 100.0000 mg | ORAL_CAPSULE | Freq: Every day | ORAL | 0 refills | Status: DC

## 2016-11-14 ENCOUNTER — Other Ambulatory Visit: Payer: Self-pay | Admitting: *Deleted

## 2016-11-14 MED ORDER — GABAPENTIN 100 MG PO CAPS: 100.0000 mg | ORAL_CAPSULE | Freq: Every day | ORAL | 0 refills | Status: DC

## 2016-11-23 ENCOUNTER — Other Ambulatory Visit: Payer: Self-pay | Admitting: Family Medicine

## 2016-11-23 MED ORDER — TRIAMCINOLONE ACETONIDE 0.1 % EX CREA: TOPICAL_CREAM | CUTANEOUS | 2 refills | Status: DC

## 2016-11-23 NOTE — Telephone Encounter (Signed)
Last refill 08/11/16 Last OV 08/22/16 Ok to refill?

## 2016-12-02 DIAGNOSIS — S83242D Other tear of medial meniscus, current injury, left knee, subsequent encounter: Secondary | ICD-10-CM | POA: Diagnosis not present

## 2016-12-21 DIAGNOSIS — Z0100 Encounter for examination of eyes and vision without abnormal findings: Secondary | ICD-10-CM | POA: Diagnosis not present

## 2016-12-21 DIAGNOSIS — I10 Essential (primary) hypertension: Secondary | ICD-10-CM | POA: Diagnosis not present

## 2016-12-30 DIAGNOSIS — S83242D Other tear of medial meniscus, current injury, left knee, subsequent encounter: Secondary | ICD-10-CM | POA: Diagnosis not present

## 2017-01-11 ENCOUNTER — Other Ambulatory Visit: Payer: Self-pay | Admitting: Family Medicine

## 2017-01-11 DIAGNOSIS — Z01419 Encounter for gynecological examination (general) (routine) without abnormal findings: Secondary | ICD-10-CM | POA: Insufficient documentation

## 2017-01-11 DIAGNOSIS — E7800 Pure hypercholesterolemia, unspecified: Secondary | ICD-10-CM

## 2017-01-12 ENCOUNTER — Ambulatory Visit: Payer: Self-pay

## 2017-01-18 ENCOUNTER — Encounter: Payer: Self-pay | Admitting: Family Medicine

## 2017-01-19 ENCOUNTER — Other Ambulatory Visit (HOSPITAL_COMMUNITY): Payer: Self-pay | Admitting: Orthopedic Surgery

## 2017-01-19 DIAGNOSIS — M25562 Pain in left knee: Secondary | ICD-10-CM

## 2017-01-23 ENCOUNTER — Other Ambulatory Visit: Payer: Self-pay | Admitting: Family Medicine

## 2017-01-24 ENCOUNTER — Ambulatory Visit (HOSPITAL_COMMUNITY): Payer: Medicare HMO

## 2017-01-24 ENCOUNTER — Encounter (HOSPITAL_COMMUNITY): Payer: Self-pay

## 2017-01-26 ENCOUNTER — Other Ambulatory Visit: Payer: Self-pay | Admitting: Family Medicine

## 2017-01-30 NOTE — Telephone Encounter (Signed)
Last refill 11/23/16 #180  Last OV 09/12/16    Ok to refill?

## 2017-02-13 NOTE — Progress Notes (Deleted)
[facility]  PATIENT: Cassandra Mcconnell DOB: August 13, 1965   REASON FOR VISIT: Follow-up for Bell's palsy HISTORY FROM: Patient    HISTORY OF PRESENT ILLNESS UPDATE 4/24/2018CM Cassandra Mcconnell, 51 year old female returns for follow-up with a prior history of Bell's palsy in 2009 and recurrence in May 2017. She returns today with continued dull pain on the left side of her face and some left facial weakness and intermittent droopiness of the left eye. She has some occasional double vision. She denies any trouble swallowing or chewing she has not had any weakness or numbness of the arms or legs. She claims she has been under a lot of stress recently her son who is age 42 is at [facility] because he is wanting to kill people. She returns for reevaluation  11/05/15 KWMs. Cassandra Mcconnell is a 51 year old left-handed white female with a history of prior left-sided Bell's palsy in 2009. The patient had a good recovery from this which occurred within only a few weeks of onset of the weakness. The patient has had recurrence of the same event that began in late May 2017. The patient started with a three-day history of left occipital headache that then progressed to severe pain involving the left ear with onset of left-sided facial weakness on 09/22/2015. The patient has had some hyperacusis with the left ear, she denies any change in taste. She has had some blurred vision and light sensitivity with the left eye. She denies trouble swallowing or chewing. She is concerned that she has not gotten better from her Bell's palsy at this point. The patient denies any numbness or weakness of the arms or legs, she denies any balance issues or difficulty controlling the bowels or the bladder. She reports some monocular double vision in the left eye. When the left eye is covered, there is no double vision with the right eye. She is using a methylcellulose eyedrop. She reports a history of dryness of the eyes prior to the  onset of the Bell's palsy, she has had some issues with tearing of the eye on the left after onset of the Bell's palsy. She returns for an evaluation.   10/01/15 Cassandra Mcconnell is a 51 y.o. female here as a referral from Dr. Deborra Medina for Bell's palsy. Past medical history of anxiety, hyperlipidemia, hypertension, depression, Bell's palsy. She started getting a headache last Monday and Tuesday she noticed weakness, she woke up Wednesday with weakness and ear pain. No inciting events, no trauma, no rash or other. She is having associated pain in the left ear. Things sound louder. It is giving her a headache. tylenol and ibuprofen not helping. She has a history of remote bell's palsy that cleared up in 2 weeks. Patient went to the ED and she was started on prednisone and valtrex. She is using drops in the eyes to lubricate, she is aware of the risk of drying out and corneal damage, wearing glasses all the time to protect her eyes given decrease in blink rate (discussed all these again and the need for vigilance). She can shut her eye and so her eye is closed all night, no dry eye in the morning. No inciting event or head trauma or illness. Her eye is blurry on the left. She has been stressed. She has left facial droop, difficulty closing the left eye with decreased blink, blurry left eye vision. No other focal neurologic symptoms or complaints.   REVIEW OF SYSTEMS: Full 14 system review of systems performed and notable  only for those listed, all others are neg:  Constitutional: neg  Cardiovascular: neg Ear/Nose/Throat: neg  Skin: neg Eyes: Discomfort under the left eye Respiratory: neg Gastroitestinal: neg  Hematology/Lymphatic: neg  Endocrine: neg Musculoskeletal:neg Allergy/Immunology: neg Neurological: Left facial drooping Psychiatric: Anxiety Sleep : neg   ALLERGIES: Allergies  Allergen Reactions  . Penicillins Shortness Of Breath    Pt tolerated ceftriaxone given in ED 10/12/14  . Zofran  [Ondansetron Hcl] Shortness Of Breath    Numbness,  . Azithromycin Swelling  . Cimetidine Other (See Comments)    unknown  . Conjugated Estrogens Other (See Comments)    emotional  . Cyclobenzaprine Hcl     REACTION: disorientation  . Dicyclomine Hcl Other (See Comments)    unknown  . Flagyl [Metronidazole] Nausea Only  . Lisinopril     REACTION: RASH AND MOUTH SORE  . Norco [Hydrocodone-Acetaminophen] Other (See Comments)    Felt like she was going through withdrawal  . Nsaids Other (See Comments)    Cannot have because of bariatric surgery  . Paroxetine Other (See Comments)    Unknown  . Percocet [Oxycodone-Acetaminophen]     Hypotension   . Rocephin [Ceftriaxone] Nausea Only  . Rosuvastatin Dermatitis  . Sulfa Antibiotics Hives and Itching  . Wellbutrin [Bupropion Hcl] Other (See Comments)    Emotional, makes depression worse  . Ciprofloxacin Itching and Rash  . Doxycycline Hyclate Itching and Rash  . Erythromycin Base Itching and Rash  . Prednisone Rash    HOME MEDICATIONS: Outpatient Medications Prior to Visit  Medication Sig Dispense Refill  . acetaminophen (TYLENOL) 500 MG tablet Take 1,000 mg by mouth every 6 (six) hours as needed. For pain     . Biotin 1 MG CAPS Take by mouth.    . Calcium 600-200 MG-UNIT tablet Take 1 tablet by mouth daily.    . citalopram (CELEXA) 20 MG tablet TAKE 1 TABLET TWICE DAILY 180 tablet 0  . cyanocobalamin 1000 MCG tablet Take 1,000 mcg by mouth daily.    Marland Kitchen docusate sodium (COLACE) 100 MG capsule Take 100-200 mg by mouth daily.     . ferrous sulfate 325 (65 FE) MG EC tablet Take 325 mg by mouth daily.     Marland Kitchen gabapentin (NEURONTIN) 100 MG capsule Take 1 capsule (100 mg total) by mouth at bedtime. Every hs and prn if needed 135 capsule 0  . Melatonin 2.5 MG CAPS Take 1 capsule by mouth at bedtime as needed and may repeat dose one time if needed (for sleep).     . Multiple Vitamin (MULTIVITAMIN) capsule Take 1 capsule by mouth daily.      . pantoprazole (PROTONIX) 40 MG tablet Take 40 mg by mouth daily.    . polyvinyl alcohol-povidone (REFRESH) 1.4-0.6 % ophthalmic solution Place 1-2 drops into both eyes daily as needed (for dry eye relief).     . promethazine (PHENERGAN) 25 MG tablet Take 25 mg by mouth every 8 (eight) hours as needed for nausea or vomiting.     . promethazine (PHENERGAN) 25 MG tablet Take 1 tablet (25 mg total) by mouth every 6 (six) hours as needed. 12 tablet 1  . simvastatin (ZOCOR) 20 MG tablet TAKE 1 TABLET AT BEDTIME 90 tablet 3  . thiamine (VITAMIN B-1) 100 MG tablet Take 100 mg by mouth daily.    Marland Kitchen triamcinolone cream (KENALOG) 0.1 % APPLY TO AFFECTED AREA TWICE DAILY. 30 g 2   No facility-administered medications prior to visit.  PAST MEDICAL HISTORY: Past Medical History:  Diagnosis Date  . Anxiety   . Back pain   . Bell's palsy    Left side, 2009, 2017  . Depression   . Diverticulitis   . GERD (gastroesophageal reflux disease)   . History of hiatal hernia   . Hyperlipidemia   . Hypertension   . Postoperative nausea 04/21/2016  . Wears glasses     PAST SURGICAL HISTORY: Past Surgical History:  Procedure Laterality Date  . BACK SURGERY  2004   l4/5 fusion  . BREAST REDUCTION SURGERY Bilateral 08/08/2014   Procedure: BILATERAL BREAST REDUCTION  (BREAST);  Surgeon: Irene Limbo, MD;  Location: [place];  Service: Plastics;  Laterality: Bilateral;  . CHOLECYSTECTOMY     1998  . LAPAROSCOPIC BILATERAL SALPINGO OOPHERECTOMY Bilateral 03/11/2015   Procedure: LAPAROSCOPIC BILATERAL SALPINGO OOPHORECTOMY;  Surgeon: Florian Buff, MD;  Location: [facility];  Service: Gynecology;  Laterality: Bilateral;  . LAPAROSCOPIC GASTRIC SLEEVE RESECTION WITH HIATAL HERNIA REPAIR N/A 03/22/2016   Procedure: LAPAROSCOPIC GASTRIC SLEEVE RESECTION WITH HIATAL HERNIA REPAIR WITHUPPER ENDOSCOPY;  Surgeon: Alphonsa Overall, MD;  Location: [facility];  Service: General;  Laterality: N/A;  .  SEPTOPLASTY  1988  . tonsillectomy  1991  . TONSILLECTOMY      FAMILY HISTORY: Family History  Problem Relation Age of Onset  . COPD Mother   . Hypertension Mother   . GER disease Mother   . Heart attack Father   . Prostate cancer Father   . Hyperlipidemia Brother   . Hypertension Brother   . Diabetes Brother   . Neuropathy Brother   . Hyperlipidemia Brother   . Hypertension Brother   . Heart attack Paternal Grandmother   . Heart attack Paternal Grandfather   . Stroke Paternal Grandfather   . Colon cancer Neg Hx   . Stomach cancer Neg Hx     SOCIAL HISTORY: Social History   Social History  . Marital status: Married    Spouse name: N/A  . Number of children: 1  . Years of education: N/A   Occupational History  . stay at home mom    Social History Main Topics  . Smoking status: Never Smoker  . Smokeless tobacco: Never Used  . Alcohol use No  . Drug use: No  . Sexual activity: Yes    Partners: Male   Other Topics Concern  . Not on file   Social History Narrative  . No narrative on file     PHYSICAL EXAM  There were no vitals filed for this visit. There is no height or weight on file to calculate BMI.  Generalized: Well developed, Obese female in no acute distress  Head: normocephalic and atraumatic,. Oropharynx benign   Neurological examination   Mentation: Alert oriented to time, place, history taking. Attention span and concentration appropriate. Recent and remote memory intact.  Follows all commands speech and language fluent.   Cranial nerve II-XII: Pupils were equal round reactive to light extraocular movements were full, visual field were full on confrontational test. Facial symmetry is not present, patient has significant facial weakness on the left, she is able to raise the left eyebrow and close the  left eye.  Sensation is less on the left than the right to pinprick.  Uvula tongue midline. head turning and shoulder shrug were normal and  symmetric.Tongue protrusion into cheek strength was normal. Motor: normal bulk and tone, full strength in the BUE, BLE, fine finger movements normal,  no pronator drift. No focal weakness Sensory: normal and symmetric to light touch, pinprick, and  Vibration, in the upper and lower extremities except decreased sensation to the left face  Coordination: finger-nose-finger, heel-to-shin bilaterally, no dysmetria Reflexes: Brachioradialis 2/2, biceps 2/2, triceps 2/2, patellar 2/2, Achilles 2/2, plantar responses were flexor bilaterally. Gait and Station: Rising up from seated position without assistance, normal stance,  moderate stride, good arm swing, smooth turning, able to perform tiptoe, and heel walking without difficulty. Tandem gait is steady  DIAGNOSTIC DATA (LABS, IMAGING, TESTING) - I reviewed patient records, labs, notes, testing and imaging myself where available.  Lab Results  Component Value Date   WBC 10.3 08/29/2016   HGB 13.1 08/29/2016   HCT 38.2 08/29/2016   MCV 86.8 08/29/2016   PLT 266 08/29/2016      Component Value Date/Time   NA 138 08/29/2016 1653   NA 140 10/01/2015 1429   K 3.5 08/29/2016 1653   CL 106 08/29/2016 1653   CO2 23 08/29/2016 1653   GLUCOSE 89 08/29/2016 1653   BUN 11 08/29/2016 1653   BUN 18 10/01/2015 1429   CREATININE 0.67 08/29/2016 1653   CALCIUM 9.2 08/29/2016 1653   PROT 7.1 08/29/2016 1653   PROT 6.9 10/01/2015 1429   ALBUMIN 3.9 08/29/2016 1653   ALBUMIN 4.1 10/01/2015 1429   AST 30 08/29/2016 1653   ALT 22 08/29/2016 1653   ALKPHOS 80 08/29/2016 1653   BILITOT 0.8 08/29/2016 1653   BILITOT 0.5 10/01/2015 1429   GFRNONAA >60 08/29/2016 1653   GFRAA >60 08/29/2016 1653   Lab Results  Component Value Date   CHOL 228 (H) 08/22/2016   HDL 35.50 (L) 08/22/2016   LDLCALC 159 (H) 08/22/2016   LDLDIRECT 105.0 10/15/2015   TRIG 170.0 (H) 08/22/2016   CHOLHDL 6 08/22/2016    Lab Results  Component Value Date   TSH 0.83 08/22/2016       ASSESSMENT AND PLAN  51 y.o. year old female  has a past medical history of Anxiety; Back pain; Bell's palsy;Which is recurrent. The fact that she has had prior Bell's palsy reduces the chances of full recovery.    PLAN: Try Gabapentin 300mg  at hs for nerve pain and discomfort Continue to use artificial tears  Follow up in 6 months I spent 25 min in total face to face time with the patient more than 50% of which was spent counseling and coordination of care, reviewing test results reviewing medications and discussing and reviewing the diagnosis of recurrent Bell's palsy and further treatment options. , Rayburn Ma, [facility], APRN  [facility] [address] [place], [place] [address] [phone]

## 2017-02-14 ENCOUNTER — Ambulatory Visit: Payer: Medicare HMO | Admitting: Nurse Practitioner

## 2017-02-14 ENCOUNTER — Encounter: Payer: Self-pay | Admitting: Family Medicine

## 2017-02-14 ENCOUNTER — Telehealth: Payer: Self-pay

## 2017-02-14 NOTE — Telephone Encounter (Signed)
I called pt, advised her that Hoyle Sauer, NP is out of the office today and her appt will need to be rescheduled. Pt is agreeable to a new appt on 04/20/2017 at 9:15am. Pt verbalized understanding of new appt date and time.

## 2017-02-16 ENCOUNTER — Other Ambulatory Visit: Payer: Self-pay | Admitting: Family Medicine

## 2017-02-16 ENCOUNTER — Other Ambulatory Visit: Payer: Self-pay | Admitting: Neurology

## 2017-02-17 ENCOUNTER — Encounter: Payer: Self-pay | Admitting: Family Medicine

## 2017-02-20 ENCOUNTER — Encounter: Payer: Self-pay | Admitting: Family Medicine

## 2017-02-20 ENCOUNTER — Ambulatory Visit (INDEPENDENT_AMBULATORY_CARE_PROVIDER_SITE_OTHER): Payer: Medicare HMO | Admitting: Family Medicine

## 2017-02-20 DIAGNOSIS — M25562 Pain in left knee: Secondary | ICD-10-CM | POA: Insufficient documentation

## 2017-02-20 DIAGNOSIS — Z9889 Other specified postprocedural states: Secondary | ICD-10-CM | POA: Diagnosis not present

## 2017-02-20 NOTE — Patient Instructions (Signed)
Great to see you. We will call you with your orthopedist referral.

## 2017-02-20 NOTE — Progress Notes (Signed)
Subjective:   Patient ID: Cassandra Mcconnell, female    DOB: March 28, 1966, 51 y.o.   MRN: [id number]  Cassandra Mcconnell is a pleasant 51 y.o. year old female who presents to clinic today with Follow-up (Patient is here today to F/U after a left kneed surgery on 7.11.2018.  States that he was supposed to repair meniscus but he took it out and she is having severe pain in that knee.)  on 02/20/2017  HPI:  S/p left knee surgery on 11/02/16- Dr. French Ana. Per pt, he removed a torn meniscus and she is now having sharp, severe stabbing pain in her left knee. She feels pain is worse that before she had surgery.  Having so much pain, narcotics not helping.  She is asking for referral to another orthopedist. Current Outpatient Prescriptions on File Prior to Visit  Medication Sig Dispense Refill  . acetaminophen (TYLENOL) 500 MG tablet Take 1,000 mg by mouth every 6 (six) hours as needed. For pain     . Biotin 1 MG CAPS Take by mouth.    . Calcium 600-200 MG-UNIT tablet Take 1 tablet by mouth daily.    . citalopram (CELEXA) 20 MG tablet TAKE 1 TABLET TWICE DAILY 180 tablet 2  . cyanocobalamin 1000 MCG tablet Take 1,000 mcg by mouth daily.    Marland Kitchen docusate sodium (COLACE) 100 MG capsule Take 100-200 mg by mouth daily.     Marland Kitchen gabapentin (NEURONTIN) 100 MG capsule TAKE 1 CAPSULE AT BEDTIME  AND AS NEEDED 135 capsule 0  . Melatonin 2.5 MG CAPS Take 1 capsule by mouth at bedtime as needed and may repeat dose one time if needed (for sleep).     . Multiple Vitamin (MULTIVITAMIN) capsule Take 1 capsule by mouth daily.    . pantoprazole (PROTONIX) 40 MG tablet Take 40 mg by mouth daily.    . polyvinyl alcohol-povidone (REFRESH) 1.4-0.6 % ophthalmic solution Place 1-2 drops into both eyes daily as needed (for dry eye relief).     . promethazine (PHENERGAN) 25 MG tablet Take 1 tablet (25 mg total) by mouth every 6 (six) hours as needed. 12 tablet 1  . simvastatin (ZOCOR) 20 MG tablet TAKE 1 TABLET AT BEDTIME 90 tablet 3    . thiamine (VITAMIN B-1) 100 MG tablet Take 100 mg by mouth daily.    Marland Kitchen triamcinolone cream (KENALOG) 0.1 % APPLY TO AFFECTED AREA TWICE DAILY. 30 g 2   No current facility-administered medications on file prior to visit.     Allergies  Allergen Reactions  . Penicillins Shortness Of Breath    Pt tolerated ceftriaxone given in ED 10/12/14  . Zofran [Ondansetron Hcl] Shortness Of Breath    Numbness,  . Azithromycin Swelling  . Cimetidine Other (See Comments)    unknown  . Conjugated Estrogens Other (See Comments)    emotional  . Cyclobenzaprine Hcl     REACTION: disorientation  . Dicyclomine Hcl Other (See Comments)    unknown  . Flagyl [Metronidazole] Nausea Only  . Lisinopril     REACTION: RASH AND MOUTH SORE  . Norco [Hydrocodone-Acetaminophen] Other (See Comments)    Felt like she was going through withdrawal  . Nsaids Other (See Comments)    Cannot have because of bariatric surgery  . Paroxetine Other (See Comments)    Unknown  . Percocet [Oxycodone-Acetaminophen]     Hypotension   . Rocephin [Ceftriaxone] Nausea Only  . Rosuvastatin Dermatitis  . Sulfa Antibiotics Hives and Itching  .  Wellbutrin [Bupropion Hcl] Other (See Comments)    Emotional, makes depression worse  . Ciprofloxacin Itching and Rash  . Doxycycline Hyclate Itching and Rash  . Erythromycin Base Itching and Rash  . Prednisone Rash    Past Medical History:  Diagnosis Date  . Anxiety   . Back pain   . Bell's palsy    Left side, 2009, 2017  . Depression   . Diverticulitis   . GERD (gastroesophageal reflux disease)   . History of hiatal hernia   . Hyperlipidemia   . Hypertension   . Postoperative nausea 04/21/2016  . Wears glasses     Past Surgical History:  Procedure Laterality Date  . BACK SURGERY  2004   l4/5 fusion  . BREAST REDUCTION SURGERY Bilateral 08/08/2014   Procedure: BILATERAL BREAST REDUCTION  (BREAST);  Surgeon: Irene Limbo, MD;  Location: [facility];   Service: Plastics;  Laterality: Bilateral;  . CHOLECYSTECTOMY     1998  . LAPAROSCOPIC BILATERAL SALPINGO OOPHERECTOMY Bilateral 03/11/2015   Procedure: LAPAROSCOPIC BILATERAL SALPINGO OOPHORECTOMY;  Surgeon: Florian Buff, MD;  Location: [facility];  Service: Gynecology;  Laterality: Bilateral;  . LAPAROSCOPIC GASTRIC SLEEVE RESECTION WITH HIATAL HERNIA REPAIR N/A 03/22/2016   Procedure: LAPAROSCOPIC GASTRIC SLEEVE RESECTION WITH HIATAL HERNIA REPAIR WITHUPPER ENDOSCOPY;  Surgeon: Alphonsa Overall, MD;  Location: [facility];  Service: General;  Laterality: N/A;  . SEPTOPLASTY  1988  . tonsillectomy  1991  . TONSILLECTOMY      Family History  Problem Relation Age of Onset  . COPD Mother   . Hypertension Mother   . GER disease Mother   . Heart attack Father   . Prostate cancer Father   . Hyperlipidemia Brother   . Hypertension Brother   . Diabetes Brother   . Neuropathy Brother   . Hyperlipidemia Brother   . Hypertension Brother   . Heart attack Paternal Grandmother   . Heart attack Paternal Grandfather   . Stroke Paternal Grandfather   . Colon cancer Neg Hx   . Stomach cancer Neg Hx     Social History   Social History  . Marital status: Married    Spouse name: N/A  . Number of children: 1  . Years of education: N/A   Occupational History  . stay at home mom    Social History Main Topics  . Smoking status: Never Smoker  . Smokeless tobacco: Never Used  . Alcohol use No  . Drug use: No  . Sexual activity: Yes    Partners: Male   Other Topics Concern  . Not on file   Social History Narrative  . No narrative on file   The PMH, PSH, Social History, Family History, Medications, and allergies have been reviewed in [facility], and have been updated if relevant.   Review of Systems  Musculoskeletal: Positive for arthralgias.  All other systems reviewed and are negative.      Objective:    BP 116/72 (BP Location: Left Arm, Patient Position: Sitting, Cuff Size: Normal)   Pulse  76   Temp 98 F (36.7 C) (Oral)   Ht 5\' 4"  (1.626 m)   Wt 199 lb 12.8 oz (90.6 kg)   SpO2 98%   BMI 34.30 kg/m    Physical Exam  Constitutional: She is oriented to person, place, and time. She appears well-developed and well-nourished. No distress.  HENT:  Head: Normocephalic and atraumatic.  Eyes: Conjunctivae are normal.  Cardiovascular: Normal rate.   Pulmonary/Chest: Effort  normal.  Musculoskeletal:       Left knee: She exhibits decreased range of motion, swelling and effusion.  Neurological: She is alert and oriented to person, place, and time. No cranial nerve deficit.  Skin: Skin is warm and dry. She is not diaphoretic.  Psychiatric: She has a normal mood and affect. Her behavior is normal. Judgment and thought content normal.  Nursing note and vitals reviewed.         Assessment & Plan:   Status post left knee surgery - Plan: Ambulatory referral to Orthopedic Surgery  Acute pain of left knee - Plan: Ambulatory referral to Orthopedic Surgery No Follow-up on file.

## 2017-02-20 NOTE — Assessment & Plan Note (Signed)
With persistent pain. Referral to another orthopedist placed per pt request. Orders Placed This Encounter  Procedures  . Ambulatory referral to Orthopedic Surgery

## 2017-02-28 ENCOUNTER — Encounter: Payer: Self-pay | Admitting: Family Medicine

## 2017-03-10 ENCOUNTER — Encounter: Payer: Self-pay | Admitting: Family Medicine

## 2017-03-13 ENCOUNTER — Other Ambulatory Visit (INDEPENDENT_AMBULATORY_CARE_PROVIDER_SITE_OTHER): Payer: Self-pay

## 2017-03-13 ENCOUNTER — Ambulatory Visit (INDEPENDENT_AMBULATORY_CARE_PROVIDER_SITE_OTHER): Payer: Medicare HMO | Admitting: Orthopaedic Surgery

## 2017-03-13 ENCOUNTER — Encounter (INDEPENDENT_AMBULATORY_CARE_PROVIDER_SITE_OTHER): Payer: Self-pay | Admitting: Orthopaedic Surgery

## 2017-03-13 VITALS — BP 134/61 | HR 75 | Resp 14 | Ht 64.0 in | Wt 198.0 lb

## 2017-03-13 DIAGNOSIS — G8929 Other chronic pain: Secondary | ICD-10-CM

## 2017-03-13 DIAGNOSIS — M25562 Pain in left knee: Principal | ICD-10-CM

## 2017-03-13 NOTE — Progress Notes (Signed)
Office Visit Note   Patient: Cassandra Mcconnell           Date of Birth: Aug 22, 1965           MRN: [id number] Visit Date: 03/13/2017              Requested by: Lucille Passy, MD [place], [place] [id number] PCP: Lucille Passy, MD   Assessment & Plan: Visit Diagnoses:  1. Chronic pain of left knee   I think her postoperative pain is related to the arthritis and predominantly that in the medial compartment. Long discussion regarding different treatment options. Cassandra Mcconnell would like to try a course of physical therapy. We will also try Visco supplementation. She has had  fair response with her medical regimen and has a number of allergies to meds. Has been on very low-dose of gabapentin for Bell's palsy.  Plan: We will schedule physical therapy for left knee.Precert Visco supplementation. Office next 3-4 weeks. This is [place] will check with her neurologist regarding increasing her gabapentin dose  Follow-Up Instructions: Return will precert Visco.   Orders:  No orders of the defined types were placed in this encounter.  No orders of the defined types were placed in this encounter.     Procedures: No procedures performed   Clinical Data: No additional findings.   Subjective: Chief Complaint  Patient presents with  . Left Knee - Pain, Edema, Numbness    Cassandra Mcconnell is a 51 y o that is here for 2nd opinion for her Left knee arthroscopy. She is in so much pain more than prior to her L knee arthroscopy on 11/02/16. No therapy per pt. Pt very unhappy has calf pain, numbness in toes.  Cassandra Mcconnell is 51 years old and is having persistent pain after a left knee arthroscopy performed in July 2018. Dr. French Ana was the operating surgeon. I rate his operative note. 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 There were also grade 2-3  changes of chondromalacia in the medial compartment. Cassandra Mcconnell continues to have pain postoperatively with a recurrent disc swelling and occasional feeling of her knee giving way. 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 She's had an allergic reaction to prednisone. She's tried wearing a brace but not sure that it's made much difference. She is seeking another opinion. Has had a history of chronic back pain treated by Dr.Cram. Presently "asymptomatic. Also has a history of left-sided Bell's palsy treated by the neurologist and has been on low-dose gabapentin I did review Dr. Alvester Morin operative note and office visits. I also reviewed films that she had on a disc of her knee performed in September. There is a decrease in the medial joint space associated with subchondral sclerosis on both the femur and the tibia. No ectopic calcification.  Review of Systems  Constitutional: Negative for chills, fatigue and fever.  Eyes: Negative for itching.  Respiratory: Negative for chest tightness and shortness of breath.   Cardiovascular: Negative for chest pain, palpitations and leg swelling.  Gastrointestinal: Negative for blood in stool, constipation and diarrhea.  Endocrine: Negative for polyuria.  Genitourinary: Negative for dysuria.  Musculoskeletal: Positive for back pain. Negative for joint swelling, neck pain and neck stiffness.  Allergic/Immunologic: Negative for immunocompromised state.  Neurological: Negative for dizziness and numbness.  Hematological:  Does not bruise/bleed easily.  Psychiatric/Behavioral: Positive for sleep disturbance. The patient is nervous/anxious.      Objective: Vital Signs: BP 134/61   Pulse 75   Resp 14   Ht 5\' 4"  (1.626 m)   Wt 198 lb (89.8 kg)   BMI 33.99 kg/m   Physical Exam  Ortho Exam awake alert and comfortable in the sitting position. Examination of the left knee reveals well-healed arthroscopic portals. No effusion  today. Some patellar crepitation with pain along the lateral patella facet. Full extension. Flexion over 105. No instability. Mild to moderate diffuse medial joint pain. None laterally. No popliteal pain or fullness. No calf pain. Has had some altered sensibility "on occasion" distal to her knee to the to the left foot seems fine today. Good pulses. Foot was warm. No edema. Straight leg raise negative. Painless range of motion of both hips.  Specialty Comments:  No specialty comments available.  Imaging: No results found.   PMFS History: Patient Active Problem List   Diagnosis Date Noted  . Status post left knee surgery 02/20/2017  . Left knee pain 02/20/2017  . Hypotension 09/12/2016  . Fatigue 08/22/2016  . Postoperative nausea 04/21/2016  . Onychomycosis 10/15/2015  . Bell's palsy 09/29/2015  . Cutaneous skin tags 02/18/2015  . Complex ovarian cyst 10/15/2014  . Diverticulitis 10/12/2014  . Severe obesity (BMI >= 40) ([place]) 05/26/2014  . Depression 05/31/2013  . Morbid obesity ([place]) 12/04/2009  . 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 HYPERTROPHY, BREAST 11/20/2006  . Anxiety state 11/10/2006  . GERD 11/10/2006  . HYPERCHOLESTEROLEMIA, PURE 11/09/2006  . HYPERTENSION, BENIGN ESSENTIAL 11/09/2006  . CHOLECYSTECTOMY, HX OF 11/09/2006   Past Medical History:  Diagnosis Date  . Anxiety   . Back pain   . Bell's palsy    Left side, 2009, 2017  . Depression   . Diverticulitis   . GERD (gastroesophageal reflux disease)   . History of hiatal hernia   . Hyperlipidemia   . Hypertension   . Postoperative nausea 04/21/2016  . Wears glasses     Family History  Problem Relation Age of Onset  . COPD Mother   . Hypertension Mother   . GER disease Mother   . Heart attack Father   . Prostate cancer Father   . Hyperlipidemia Brother   . Hypertension Brother   . Diabetes Brother   . Neuropathy Brother   . Hyperlipidemia Brother   . Hypertension Brother   . Heart  attack Paternal Grandmother   . Heart attack Paternal Grandfather   . Stroke Paternal Grandfather   . Colon cancer Neg Hx   . Stomach cancer Neg Hx     Past Surgical History:  Procedure Laterality Date  . BACK SURGERY  2004   l4/5 fusion  . BILATERAL BREAST REDUCTION  (BREAST) Bilateral 08/08/2014   Performed by Irene Limbo, MD at [facility]  . CHOLECYSTECTOMY     1998  . LAPAROSCOPIC BILATERAL SALPINGO OOPHORECTOMY Bilateral 03/11/2015   Performed by Florian Buff, MD at [facility]  . LAPAROSCOPIC GASTRIC SLEEVE RESECTION WITH HIATAL HERNIA REPAIR WITHUPPER ENDOSCOPY N/A 03/22/2016   Performed by Alphonsa Overall, MD at [facility]  . SEPTOPLASTY  1988  . tonsillectomy  1991  . TONSILLECTOMY     Social History   Occupational History  . Occupation: stay at home mom  Tobacco Use  . Smoking status: Never Smoker  . Smokeless tobacco: Never Used  Substance and Sexual Activity  .  Alcohol use: No  . Drug use: No  . Sexual activity: Yes    Partners: Male

## 2017-03-14 ENCOUNTER — Encounter: Payer: Self-pay | Admitting: Family Medicine

## 2017-03-14 ENCOUNTER — Ambulatory Visit (HOSPITAL_COMMUNITY): Payer: Medicare HMO | Attending: Orthopaedic Surgery | Admitting: Physical Therapy

## 2017-03-14 ENCOUNTER — Ambulatory Visit: Payer: Medicare HMO | Admitting: Family Medicine

## 2017-03-14 VITALS — BP 118/70 | HR 73 | Temp 97.7°F | Ht 64.0 in | Wt 201.0 lb

## 2017-03-14 DIAGNOSIS — F411 Generalized anxiety disorder: Secondary | ICD-10-CM

## 2017-03-14 DIAGNOSIS — J309 Allergic rhinitis, unspecified: Secondary | ICD-10-CM | POA: Insufficient documentation

## 2017-03-14 DIAGNOSIS — J3089 Other allergic rhinitis: Secondary | ICD-10-CM | POA: Diagnosis not present

## 2017-03-14 MED ORDER — BUSPIRONE HCL 15 MG PO TABS: 15.0000 mg | ORAL_TABLET | Freq: Two times a day (BID) | ORAL | 3 refills | Status: DC

## 2017-03-14 NOTE — Assessment & Plan Note (Signed)
Deteriorated. Advised 3 days of Afrin, continue flonase. Call or return to clinic prn if these symptoms worsen or fail to improve as anticipated.

## 2017-03-14 NOTE — Patient Instructions (Signed)
Great to see you.  Let's try Afrin OTC- no more than 3 days.  We are adding Buspar 15 mg twice daily daily.  Please keep me updated.  Happy Thanksgiving!

## 2017-03-14 NOTE — Progress Notes (Signed)
Subjective:   Patient ID: Cassandra Mcconnell, female    DOB: 05/19/1965, 51 y.o.   MRN: [id number]  Cassandra Mcconnell is a pleasant 50 y.o. year old female who presents to clinic today with Nasal Congestion (left side nose felt like swelling---3 mo) and Stress (anxiety is getting worse. )  on 03/14/2017  HPI:  Nasal congestion-  Left side of nose feels clogged. Flonase is not helping.  Anxiety- feels it is getting worse.  Constantly anxious.  Currently taking Celexa 20 mg twice daily.  Feels Celexa does help.  More stressors at home with her foster children. Denies feeling depressed. No SI or HI.    Current Outpatient Medications on File Prior to Visit  Medication Sig Dispense Refill  . acetaminophen (TYLENOL) 500 MG tablet Take 1,000 mg by mouth every 6 (six) hours as needed. For pain     . Biotin 1 MG CAPS Take by mouth.    . Calcium 600-200 MG-UNIT tablet Take 1 tablet by mouth daily.    . citalopram (CELEXA) 20 MG tablet TAKE 1 TABLET TWICE DAILY 180 tablet 2  . cyanocobalamin 1000 MCG tablet Take 1,000 mcg by mouth daily.    Marland Kitchen docusate sodium (COLACE) 100 MG capsule Take 100-200 mg by mouth daily.     Marland Kitchen gabapentin (NEURONTIN) 100 MG capsule TAKE 1 CAPSULE AT BEDTIME  AND AS NEEDED 135 capsule 0  . Melatonin 2.5 MG CAPS Take 1 capsule by mouth at bedtime as needed and may repeat dose one time if needed (for sleep).     . Multiple Vitamin (MULTIVITAMIN) capsule Take 1 capsule by mouth daily.    . pantoprazole (PROTONIX) 40 MG tablet Take 40 mg by mouth daily.    . polyvinyl alcohol-povidone (REFRESH) 1.4-0.6 % ophthalmic solution Place 1-2 drops into both eyes daily as needed (for dry eye relief).     . triamcinolone cream (KENALOG) 0.1 % APPLY TO AFFECTED AREA TWICE DAILY. 30 g 2  . promethazine (PHENERGAN) 25 MG tablet Take 1 tablet (25 mg total) by mouth every 6 (six) hours as needed. (Patient not taking: Reported on 03/14/2017) 12 tablet 1  . thiamine (VITAMIN B-1) 100 MG tablet  Take 100 mg by mouth daily.     No current facility-administered medications on file prior to visit.     Allergies  Allergen Reactions  . Penicillins Shortness Of Breath    Pt tolerated ceftriaxone given in ED 10/12/14  . Zofran [Ondansetron Hcl] Shortness Of Breath    Numbness,  . Azithromycin Swelling  . Cimetidine Other (See Comments)    unknown  . Conjugated Estrogens Other (See Comments)    emotional  . Cyclobenzaprine Hcl     REACTION: disorientation  . Dicyclomine Hcl Other (See Comments)    unknown  . Flagyl [Metronidazole] Nausea Only  . Lisinopril     REACTION: RASH AND MOUTH SORE  . Norco [Hydrocodone-Acetaminophen] Other (See Comments)    Felt like she was going through withdrawal  . Nsaids Other (See Comments)    Cannot have because of bariatric surgery  . Paroxetine Other (See Comments)    Unknown  . Percocet [Oxycodone-Acetaminophen]     Hypotension   . Rocephin [Ceftriaxone] Nausea Only  . Rosuvastatin Dermatitis  . Sulfa Antibiotics Hives and Itching  . Wellbutrin [Bupropion Hcl] Other (See Comments)    Emotional, makes depression worse  . Ciprofloxacin Itching and Rash  . Doxycycline Hyclate Itching and Rash  . Erythromycin Base Itching  and Rash  . Prednisone Rash    Past Medical History:  Diagnosis Date  . Anxiety   . Back pain   . Bell's palsy    Left side, 2009, 2017  . Depression   . Diverticulitis   . GERD (gastroesophageal reflux disease)   . History of hiatal hernia   . Hyperlipidemia   . Hypertension   . Postoperative nausea 04/21/2016  . Wears glasses     Past Surgical History:  Procedure Laterality Date  . BACK SURGERY  2004   l4/5 fusion  . BILATERAL BREAST REDUCTION  (BREAST) Bilateral 08/08/2014   [facility] Irene Limbo[facility]  . CHOLECYSTECTOMY     1998  . LAPAROSCOPIC BILATERAL SALPINGO OOPHORECTOMY Bilateral 03/11/2015   Performed by Florian Buff, MD at [facility]  . LAPAROSCOPIC  GASTRIC SLEEVE RESECTION WITH HIATAL HERNIA REPAIR WITHUPPER ENDOSCOPY N/A 03/22/2016   Performed by Alphonsa Overall, MD at [facility]  . SEPTOPLASTY  1988  . tonsillectomy  1991  . TONSILLECTOMY      Family History  Problem Relation Age of Onset  . COPD Mother   . Hypertension Mother   . GER disease Mother   . Heart attack Father   . Prostate cancer Father   . Hyperlipidemia Brother   . Hypertension Brother   . Diabetes Brother   . Neuropathy Brother   . Hyperlipidemia Brother   . Hypertension Brother   . Heart attack Paternal Grandmother   . Heart attack Paternal Grandfather   . Stroke Paternal Grandfather   . Colon cancer Neg Hx   . Stomach cancer Neg Hx     Social History   Socioeconomic History  . Marital status: Married    Spouse name: Not on file  . Number of children: 1  . Years of education: Not on file  . Highest education level: Not on file  Social Needs  . Financial resource strain: Not on file  . Food insecurity - worry: Not on file  . Food insecurity - inability: Not on file  . Transportation needs - medical: Not on file  . Transportation needs - non-medical: Not on file  Occupational History  . Occupation: stay at home mom  Tobacco Use  . Smoking status: Never Smoker  . Smokeless tobacco: Never Used  Substance and Sexual Activity  . Alcohol use: No  . Drug use: No  . Sexual activity: Yes    Partners: Male  Other Topics Concern  . Not on file  Social History Narrative  . Not on file   The PMH, PSH, Social History, Family History, Medications, and allergies have been reviewed in [facility], and have been updated if relevant.             Review of Systems  Constitutional: Negative.   Psychiatric/Behavioral: Negative for agitation, behavioral problems, confusion, decreased concentration, dysphoric mood, hallucinations, self-injury, sleep disturbance and suicidal ideas. The patient is nervous/anxious. The patient is not hyperactive.   All other systems  reviewed and are negative.      Objective:    BP 118/70   Pulse 73   Temp 97.7 F (36.5 C)   Ht 5\' 4"  (1.626 m)   Wt 201 lb (91.2 kg)   SpO2 99%   BMI 34.50 kg/m    Physical Exam  Constitutional: She is oriented to person, place, and time. She appears well-developed and well-nourished. No distress.  HENT:  Head: Normocephalic and atraumatic.  Right Ear: Hearing and tympanic membrane normal.  Left Ear: Hearing and tympanic membrane normal.  Nose: Mucosal edema present.  Eyes: Conjunctivae are normal.  Cardiovascular: Normal rate.  Pulmonary/Chest: Effort normal.  Neurological: She is alert and oriented to person, place, and time. No cranial nerve deficit.  Skin: Skin is warm and dry. She is not diaphoretic.  Psychiatric: She has a normal mood and affect. Her behavior is normal. Thought content normal.  Nursing note and vitals reviewed.         Assessment & Plan:   Anxiety state No Follow-up on file.

## 2017-03-14 NOTE — Assessment & Plan Note (Signed)
Deteriorated. Add Buspar 15 mg twice daily. Call or return to clinic prn if these symptoms worsen or fail to improve as anticipated.

## 2017-03-22 ENCOUNTER — Ambulatory Visit (HOSPITAL_COMMUNITY): Payer: Medicare HMO | Admitting: Physical Therapy

## 2017-03-22 ENCOUNTER — Telehealth (HOSPITAL_COMMUNITY): Payer: Self-pay | Admitting: Physical Therapy

## 2017-03-22 NOTE — Telephone Encounter (Signed)
Patient called and canceled her appt ,she donot need rehab at this time the medication she is taken have helped her with the pain alot. When asked if she wanted [place] to hold the order for awhile she said it wont be needand .

## 2017-03-27 ENCOUNTER — Encounter: Payer: Self-pay | Admitting: Family Medicine

## 2017-03-28 ENCOUNTER — Other Ambulatory Visit: Payer: Self-pay | Admitting: Family Medicine

## 2017-03-28 ENCOUNTER — Telehealth (INDEPENDENT_AMBULATORY_CARE_PROVIDER_SITE_OTHER): Payer: Self-pay

## 2017-03-28 MED ORDER — BUSPIRONE HCL 30 MG PO TABS: 30.0000 mg | ORAL_TABLET | Freq: Two times a day (BID) | ORAL | 3 refills | Status: DC

## 2017-03-28 NOTE — Telephone Encounter (Signed)
Called pt regarding her insurance and they told her [employer] will not pay for visco. I gave her the 800 number to call and see which visco they will pay

## 2017-03-29 ENCOUNTER — Telehealth: Payer: Self-pay

## 2017-03-29 NOTE — Telephone Encounter (Signed)
Pt called [facility] upset with a complaint that her email did not get a response/I researched and looks like TA took care of it and increased BuSpar to 30mg  bid and sent to local pharm on file/I called TA and she said that she was very sorry and that she thought that she responded to the patient and to plz express that to the patient/I called and LMOVM explaining all of this and to plz let us know if there is anything else we can help her with and thanked her for bringing this to our attention/thx dmf

## 2017-03-31 ENCOUNTER — Telehealth (INDEPENDENT_AMBULATORY_CARE_PROVIDER_SITE_OTHER): Payer: Self-pay | Admitting: Orthopaedic Surgery

## 2017-03-31 NOTE — Telephone Encounter (Signed)
Patient returning your call in reference to [place]. Please call to discuss.

## 2017-04-05 NOTE — Telephone Encounter (Signed)
Called [place] and she said she had a $300 copay and will not pay that amount. Will try again in 2019.

## 2017-04-19 NOTE — Progress Notes (Signed)
[facility]  PATIENT: Cassandra Mcconnell DOB: May 08, 1965   REASON FOR VISIT: Follow-up for Bell's palsy HISTORY FROM: Patient    HISTORY OF PRESENT ILLNESS UPDATE 12/27/2018CM [name], 51 year old female returns for follow-up with a history of Bell's palsy in 2009 and recurrence in May 2017.  She continues to have a dull pain on the left side of her face with continued left facial weakness and intermittent droopiness of the eyelid on the left.  She denies any double vision.  She denies any difficulty swallowing or chewing.  She has not had any increased weakness or numbness of the arms or legs.  She continues to be under a lot of stress with her son who is currently in a psychiatric inpatient facility but the facility is wanting to discharge him to her home.  Gabapentin continues to work for her nerve pain.  She recently had knee surgery for torn meniscus on the left.  She is currently wearing a brace.  She returns for reevaluation   UPDATE 4/24/2018CM [name], 51 year old female returns for follow-up with a prior history of Bell's palsy in 2009 and recurrence in May 2017. She returns today with continued dull pain on the left side of her face and some left facial weakness and intermittent droopiness of the left eye. She has some occasional double vision. She denies any trouble swallowing or chewing she has not had any weakness or numbness of the arms or legs. She claims she has been under a lot of stress recently her son who is age 57 is at [facility] because he is wanting to kill people. She returns for reevaluation  11/05/15 Cassandra Mcconnell. [place] is a 51 year old left-handed white female with a history of prior left-sided Bell's palsy in 2009. The patient had a good recovery from this which occurred within only a few weeks of onset of the weakness. The patient has had recurrence of the same event that began in late May 2017. The patient started with a three-day history of left  occipital headache that then progressed to severe pain involving the left ear with onset of left-sided facial weakness on 09/22/2015. The patient has had some hyperacusis with the left ear, she denies any change in taste. She has had some blurred vision and light sensitivity with the left eye. She denies trouble swallowing or chewing. She is concerned that she has not gotten better from her Bell's palsy at this point. The patient denies any numbness or weakness of the arms or legs, she denies any balance issues or difficulty controlling the bowels or the bladder. She reports some monocular double vision in the left eye. When the left eye is covered, there is no double vision with the right eye. She is using a methylcellulose eyedrop. She reports a history of dryness of the eyes prior to the onset of the Bell's palsy, she has had some issues with tearing of the eye on the left after onset of the Bell's palsy. She returns for an evaluation.   10/01/15 Cassandra Mcconnell is a 51 y.o. female here as a referral from Dr. Deborra Medina for Bell's palsy. Past medical history of anxiety, hyperlipidemia, hypertension, depression, Bell's palsy. She started getting a headache last Monday and Tuesday she noticed weakness, she woke up Wednesday with weakness and ear pain. No inciting events, no trauma, no rash or other. She is having associated pain in the left ear. Things sound louder. It is giving her a headache. tylenol and ibuprofen not helping.  She has a history of remote bell's palsy that cleared up in 2 weeks. Patient went to the ED and she was started on prednisone and valtrex. She is using drops in the eyes to lubricate, she is aware of the risk of drying out and corneal damage, wearing glasses all the time to protect her eyes given decrease in blink rate (discussed all these again and the need for vigilance). She can shut her eye and so her eye is closed all night, no dry eye in the morning. No inciting event or head trauma or  illness. Her eye is blurry on the left. She has been stressed. She has left facial droop, difficulty closing the left eye with decreased blink, blurry left eye vision. No other focal neurologic symptoms or complaints.   REVIEW OF SYSTEMS: Full 14 system review of systems performed and notable only for those listed, all others are neg:  Constitutional: neg  Cardiovascular: neg Ear/Nose/Throat: neg  Skin: neg Eyes:  Respiratory: neg Gastroitestinal: neg  Hematology/Lymphatic: neg  Endocrine: neg Musculoskeletal: Joint pain, walking difficulty Allergy/Immunology: neg Neurological: Left facial drooping Psychiatric: Anxiety, depression treated by psychiatry Sleep : neg   ALLERGIES: Allergies  Allergen Reactions  . Penicillins Shortness Of Breath    Pt tolerated ceftriaxone given in ED 10/12/14  . Zofran [Ondansetron Hcl] Shortness Of Breath    Numbness,  . Azithromycin Swelling  . Cimetidine Other (See Comments)    unknown  . Conjugated Estrogens Other (See Comments)    emotional  . Cyclobenzaprine Hcl     REACTION: disorientation  . Dicyclomine Hcl Other (See Comments)    unknown  . Flagyl [Metronidazole] Nausea Only  . Lisinopril     REACTION: RASH AND MOUTH SORE  . Norco [Hydrocodone-Acetaminophen] Other (See Comments)    Felt like she was going through withdrawal  . Nsaids Other (See Comments)    Cannot have because of bariatric surgery  . Paroxetine Other (See Comments)    Unknown  . Percocet [Oxycodone-Acetaminophen]     Hypotension   . Rocephin [Ceftriaxone] Nausea Only  . Rosuvastatin Dermatitis  . Sulfa Antibiotics Hives and Itching  . Wellbutrin [Bupropion Hcl] Other (See Comments)    Emotional, makes depression worse  . Ciprofloxacin Itching and Rash  . Doxycycline Hyclate Itching and Rash  . Erythromycin Base Itching and Rash  . Prednisone Rash    HOME MEDICATIONS: Outpatient Medications Prior to Visit  Medication Sig Dispense Refill  .  acetaminophen (TYLENOL) 500 MG tablet Take 1,000 mg by mouth every 6 (six) hours as needed. For pain     . Biotin 1 MG CAPS Take by mouth.    . busPIRone (BUSPAR) 30 MG tablet Take 1 tablet (30 mg total) by mouth 2 (two) times daily. 60 tablet 3  . Calcium 600-200 MG-UNIT tablet Take 1 tablet by mouth daily.    . citalopram (CELEXA) 20 MG tablet TAKE 1 TABLET TWICE DAILY 180 tablet 2  . cyanocobalamin 1000 MCG tablet Take 1,000 mcg by mouth daily.    Marland Kitchen docusate sodium (COLACE) 100 MG capsule Take 100-200 mg by mouth daily.     Marland Kitchen gabapentin (NEURONTIN) 100 MG capsule TAKE 1 CAPSULE AT BEDTIME  AND AS NEEDED 135 capsule 0  . Melatonin 2.5 MG CAPS Take 1 capsule by mouth at bedtime as needed and may repeat dose one time if needed (for sleep).     . Multiple Vitamin (MULTIVITAMIN) capsule Take 1 capsule by mouth daily.    Marland Kitchen  pantoprazole (PROTONIX) 40 MG tablet Take 40 mg by mouth daily.    . polyvinyl alcohol-povidone (REFRESH) 1.4-0.6 % ophthalmic solution Place 1-2 drops into both eyes daily as needed (for dry eye relief).     . promethazine (PHENERGAN) 25 MG tablet Take 1 tablet (25 mg total) by mouth every 6 (six) hours as needed. 12 tablet 1  . thiamine (VITAMIN B-1) 100 MG tablet Take 100 mg by mouth daily.    Marland Kitchen triamcinolone cream (KENALOG) 0.1 % APPLY TO AFFECTED AREA TWICE DAILY. 30 g 2   No facility-administered medications prior to visit.     PAST MEDICAL HISTORY: Past Medical History:  Diagnosis Date  . Anxiety   . Back pain   . Bell's palsy    Left side, 2009, 2017  . Depression   . Diverticulitis   . GERD (gastroesophageal reflux disease)   . History of hiatal hernia   . Hyperlipidemia   . Hypertension   . Postoperative nausea 04/21/2016  . Wears glasses     PAST SURGICAL HISTORY: Past Surgical History:  Procedure Laterality Date  . BACK SURGERY  2004   l4/5 fusion  . BREAST REDUCTION SURGERY Bilateral 08/08/2014   Procedure: BILATERAL BREAST REDUCTION  (BREAST);   Surgeon: Irene Limbo, MD;  Location: [facility];  Service: Plastics;  Laterality: Bilateral;  . CHOLECYSTECTOMY     1998  . LAPAROSCOPIC BILATERAL SALPINGO OOPHERECTOMY Bilateral 03/11/2015   Procedure: LAPAROSCOPIC BILATERAL SALPINGO OOPHORECTOMY;  Surgeon: Florian Buff, MD;  Location: [facility];  Service: Gynecology;  Laterality: Bilateral;  . LAPAROSCOPIC GASTRIC SLEEVE RESECTION WITH HIATAL HERNIA REPAIR N/A 03/22/2016   Procedure: LAPAROSCOPIC GASTRIC SLEEVE RESECTION WITH HIATAL HERNIA REPAIR WITHUPPER ENDOSCOPY;  Surgeon: Alphonsa Overall, MD;  Location: [facility];  Service: General;  Laterality: N/A;  . SEPTOPLASTY  1988  . tonsillectomy  1991  . TONSILLECTOMY      FAMILY HISTORY: Family History  Problem Relation Age of Onset  . COPD Mother   . Hypertension Mother   . GER disease Mother   . Heart attack Father   . Prostate cancer Father   . Hyperlipidemia Brother   . Hypertension Brother   . Diabetes Brother   . Neuropathy Brother   . Hyperlipidemia Brother   . Hypertension Brother   . Heart attack Paternal Grandmother   . Heart attack Paternal Grandfather   . Stroke Paternal Grandfather   . Colon cancer Neg Hx   . Stomach cancer Neg Hx     SOCIAL HISTORY: Social History   Socioeconomic History  . Marital status: Married    Spouse name: Not on file  . Number of children: 1  . Years of education: Not on file  . Highest education level: Not on file  Social Needs  . Financial resource strain: Not on file  . Food insecurity - worry: Not on file  . Food insecurity - inability: Not on file  . Transportation needs - medical: Not on file  . Transportation needs - non-medical: Not on file  Occupational History  . Occupation: stay at home mom  Tobacco Use  . Smoking status: Never Smoker  . Smokeless tobacco: Never Used  Substance and Sexual Activity  . Alcohol use: No  . Drug use: No  . Sexual activity: Yes    Partners: Male  Other Topics Concern  .  Not on file  Social History Narrative  . Not on file     PHYSICAL EXAM  Vitals:  04/20/17 0854  BP: 114/70  Pulse: 73  Weight: 203 lb (92.1 kg)  Height: 5\' 4"  (1.626 m)   Body mass index is 34.84 kg/m.  Generalized: Well developed, Obese female in no acute distress  Head: normocephalic and atraumatic,. Oropharynx benign   Neurological examination   Mentation: Alert oriented to time, place, history taking. Attention span and concentration appropriate. Recent and remote memory intact.  Follows all commands speech and language fluent.   Cranial nerve II-XII: Pupils were equal round reactive to light extraocular movements were full, visual field were full on confrontational test. Facial symmetry is not present, patient has significant facial weakness on the left, she is able to raise the left eyebrow and close the  left eye.  Sensation is less on the left than the right to pinprick.  Uvula tongue midline. head turning and shoulder shrug were normal and symmetric.Tongue protrusion into cheek strength was normal. Motor: normal bulk and tone, full strength in the BUE, BLE, fine finger movements normal, no pronator drift. No focal weakness Sensory: normal and symmetric to light touch, pinprick, and  Vibration, in the upper and lower extremities except decreased sensation to the left face  Coordination: finger-nose-finger, heel-to-shin bilaterally, no dysmetria Reflexes: Brachioradialis 2/2, biceps 2/2, triceps 2/2, patellar 2/2, Achilles 2/2, plantar responses were flexor bilaterally. Gait and Station: Rising up from seated position without assistance, normal stance,  moderate stride, ambulates with a limp since her knee surgery.  Uses a single-point cane.   DIAGNOSTIC DATA (LABS, IMAGING, TESTING) - I reviewed patient records, labs, notes, testing and imaging myself where available.  Lab Results  Component Value Date   WBC 10.3 08/29/2016   HGB 13.1 08/29/2016   HCT 38.2 08/29/2016     MCV 86.8 08/29/2016   PLT 266 08/29/2016      Component Value Date/Time   NA 138 08/29/2016 1653   NA 140 10/01/2015 1429   K 3.5 08/29/2016 1653   CL 106 08/29/2016 1653   CO2 23 08/29/2016 1653   GLUCOSE 89 08/29/2016 1653   BUN 11 08/29/2016 1653   BUN 18 10/01/2015 1429   CREATININE 0.67 08/29/2016 1653   CALCIUM 9.2 08/29/2016 1653   PROT 7.1 08/29/2016 1653   PROT 6.9 10/01/2015 1429   ALBUMIN 3.9 08/29/2016 1653   ALBUMIN 4.1 10/01/2015 1429   AST 30 08/29/2016 1653   ALT 22 08/29/2016 1653   ALKPHOS 80 08/29/2016 1653   BILITOT 0.8 08/29/2016 1653   BILITOT 0.5 10/01/2015 1429   GFRNONAA >60 08/29/2016 1653   GFRAA >60 08/29/2016 1653   Lab Results  Component Value Date   CHOL 228 (H) 08/22/2016   HDL 35.50 (L) 08/22/2016   LDLCALC 159 (H) 08/22/2016   LDLDIRECT 105.0 10/15/2015   TRIG 170.0 (H) 08/22/2016   CHOLHDL 6 08/22/2016    Lab Results  Component Value Date   TSH 0.83 08/22/2016      ASSESSMENT AND PLAN  51 y.o. year old female  has a past medical history of Anxiety; Back pain; Bell's palsy;Which is recurrent. The fact that she has had prior Bell's palsy reduces the chances of full recovery.    PLAN: Try Gabapentin 200mg  at hs for nerve pain and discomfort and 100mg  during the day Continue to use artificial tears  Follow up yearly I spent 20 min in total face to face time with the patient more than 50% of which was spent counseling and coordination of care, reviewing test results reviewing medications and  discussing and reviewing the diagnosis of recurrent Bell's palsy and further treatment options. , Rayburn Ma, [facility], APRN  [facility] [address], [place], [place] [address] [phone]

## 2017-04-20 ENCOUNTER — Encounter: Payer: Self-pay | Admitting: Nurse Practitioner

## 2017-04-20 ENCOUNTER — Telehealth: Payer: Self-pay | Admitting: Neurology

## 2017-04-20 ENCOUNTER — Ambulatory Visit: Payer: Self-pay | Admitting: Nurse Practitioner

## 2017-04-20 ENCOUNTER — Ambulatory Visit: Payer: Medicare HMO | Admitting: Nurse Practitioner

## 2017-04-20 VITALS — BP 114/70 | HR 73 | Ht 64.0 in | Wt 203.0 lb

## 2017-04-20 DIAGNOSIS — G510 Bell's palsy: Secondary | ICD-10-CM

## 2017-04-20 MED ORDER — GABAPENTIN 100 MG PO CAPS
ORAL_CAPSULE | ORAL | 3 refills | Status: DC
Start: 2017-04-20 — End: 2017-11-27

## 2017-04-20 NOTE — Telephone Encounter (Signed)
Cassandra Mcconnell, You saw this patient today. Your exam shows decreased sensation on the left side of the face. This may be subjective , but sensory loss not c/w Bells Palsy. Maybe an MRI brain w/wo contrast in indicated to ensure nothing else is causing her symptoms? Dr. Jannifer Franklin saw her last, unclear if she prefers to follow with him or myself but please consider MRI brain w/wo contrast if she has not had one.

## 2017-04-20 NOTE — Progress Notes (Signed)
Personally  participated in, made any corrections needed, and agree with history, physical, neuro exam,assessment and plan as stated above.    Antonia Ahern, MD [facility] 

## 2017-04-20 NOTE — Progress Notes (Signed)
Cassandra Mcconnell, Your exam shows decreased sensation on the left side of the face. This may be subjective , but sensory loss not c/w Bells Palsy. Maybe an MRI brain w/wo contrast in indicated to ensure nothing else is causing her symptoms. Dr. Jannifer Franklin saw her last, unclear if she prefers to follow with him or myself but please consider MRI brain w/wo contrast if she has not had one.

## 2017-04-20 NOTE — Patient Instructions (Signed)
Try Gabapentin 200mg  at hs for nerve pain and discomfort and 100mg  during the day Continue to use artificial tears  Follow up yearly

## 2017-04-20 NOTE — Progress Notes (Signed)
Personally  participated in, made any corrections needed, and agree with history, physical, neuro exam,assessment and plan as stated above.  May recommend MRI brain w/wo contrast, will discuss with NP.    Sarina Ill, MD [facility]

## 2017-04-21 ENCOUNTER — Telehealth: Payer: Self-pay | Admitting: Nurse Practitioner

## 2017-04-21 DIAGNOSIS — G527 Disorders of multiple cranial nerves: Secondary | ICD-10-CM

## 2017-04-21 DIAGNOSIS — G510 Bell's palsy: Secondary | ICD-10-CM

## 2017-04-21 DIAGNOSIS — R200 Anesthesia of skin: Secondary | ICD-10-CM

## 2017-04-21 DIAGNOSIS — R29818 Other symptoms and signs involving the nervous system: Secondary | ICD-10-CM

## 2017-04-21 NOTE — Telephone Encounter (Signed)
Called and spoke with patient. I advised her that Hoyle Sauer NP spoke with Dr. Leta Baptist regarding some decreased sensation of the L side of her face on exam. Therefore, an MRI has been ordered since she has not had one. She verbalized understanding and is fine with that but did say "oh that's from bell's palsy". She had no further questions or concerns and is aware that the office is closed until Wednesday so she likely won't get a call to schedule until later next week.

## 2017-04-21 NOTE — Telephone Encounter (Signed)
Cassandra Mcconnell, spoke to Dr.  Leta Baptist regarding MRI of the brain because she does have decreased sensation  And sensory loss on exam.  He also recommended MRI since she has never had one done. Will order please call the patient and let her know recommendation. Thanks

## 2017-04-21 NOTE — Progress Notes (Signed)
Cassandra Mcconnell, I read your note, her symptoms have not changed and her facial weakness has not changed but I dont mind getting MRI if you think it is necessary.

## 2017-04-21 NOTE — Telephone Encounter (Signed)
Cassandra Mcconnell, I read your note, her symptoms have not changed and her facial weakness has not changed but I dont mind getting MRI if you think it is necessary.

## 2017-05-02 ENCOUNTER — Ambulatory Visit (HOSPITAL_COMMUNITY): Admission: RE | Admit: 2017-05-02 | Payer: Medicare HMO | Source: Ambulatory Visit

## 2017-05-03 ENCOUNTER — Ambulatory Visit: Payer: Self-pay | Admitting: Registered"

## 2017-05-16 ENCOUNTER — Encounter: Payer: Medicare HMO | Admitting: Family Medicine

## 2017-05-23 ENCOUNTER — Telehealth (INDEPENDENT_AMBULATORY_CARE_PROVIDER_SITE_OTHER): Payer: Self-pay | Admitting: Orthopaedic Surgery

## 2017-05-23 NOTE — Telephone Encounter (Signed)
OK 

## 2017-05-23 NOTE — Telephone Encounter (Signed)
LMOM

## 2017-05-23 NOTE — Telephone Encounter (Signed)
Patient called requesting a return call regarding her knee and the possibility of injection.  Patient's CB# [phone]

## 2017-05-24 NOTE — Telephone Encounter (Signed)
I called patient who was very upset because she has not received information regarding visco. I am reapply. Patient refuses to come into office for another appt.

## 2017-05-24 NOTE — Telephone Encounter (Signed)
FYI - I called patient to schedule appt with Aaron Edelman as discussed on 05/23/17. She is very upset. She wants to know about her knee injections. She stated she is not coming back into the office to pay another co-pay. Note states that we will apply for visco in 2019. I have reapplied for visco. Thank you.

## 2017-05-26 ENCOUNTER — Telehealth (INDEPENDENT_AMBULATORY_CARE_PROVIDER_SITE_OTHER): Payer: Self-pay

## 2017-05-26 NOTE — Telephone Encounter (Signed)
Call [facility] to let them know a PA was sent 05/25/2017. Inquiring about how much out of pocket expense patient will have. Explained she wants patient assistance.  Cassandra Mcconnell wants me to email her all the numbers to call to find out her out of pocket costs for [facility] and Euflexxa

## 2017-05-30 ENCOUNTER — Telehealth (INDEPENDENT_AMBULATORY_CARE_PROVIDER_SITE_OTHER): Payer: Self-pay | Admitting: Orthopaedic Surgery

## 2017-05-30 ENCOUNTER — Telehealth (INDEPENDENT_AMBULATORY_CARE_PROVIDER_SITE_OTHER): Payer: Self-pay

## 2017-05-30 NOTE — Telephone Encounter (Signed)
Patient calling to see if you can email injection information to her so she can contact insurance. Also the phone # for the pharmacy? Patient upset that she has not gotten information, and "tired of playing phone tag". Patient is stating she is going to call her insurance company, and file a Tourist information centre manager. Please call patient to advise.

## 2017-05-30 NOTE — Telephone Encounter (Signed)
Jonathon Resides  [facility] 05/26/2017 2:07 PM  l  ?  Hi Yeraldin, here are the numbers, I hope this helps. Please let us know or respond to this My Chart message with the outcome.  Thank you  Euflexxa [phone] [facility] [phone]  This is the [place]. Just ask about for your insurance, what is your copay and what percentage you will pay, check either [facility] uses patient purchase option mailed to our office.  Thanks, Marcie Bal

## 2017-05-30 NOTE — Telephone Encounter (Signed)
Cassandra Mcconnell, please call her and give her the information she requested. Thank you.

## 2017-05-30 NOTE — Telephone Encounter (Signed)
Called pt to let her know the email did not deliver the email sent from [facility]. Sent a message with both Euflexxa or [facility] numbers for patient assistance

## 2017-06-01 ENCOUNTER — Telehealth: Payer: Self-pay | Admitting: Family Medicine

## 2017-06-01 MED ORDER — BUSPIRONE HCL 30 MG PO TABS: 30.0000 mg | ORAL_TABLET | Freq: Two times a day (BID) | ORAL | 2 refills | Status: DC

## 2017-06-01 NOTE — Telephone Encounter (Signed)
Copied from [place]. Topic: Quick Communication - Rx Refill/Question >> Jun 01, 2017 12:11 PM Aurelio Brash B wrote: Medication: busPIRone (BUSPAR) 30 MG tablet   Has the patient contacted their pharmacy? no  (Agent: If no, request that the patient contact the pharmacy for the refill.)   Preferred Pharmacy (with phone number or street name): [place], [place] [phone] (Phone) [phone] (Fax)     Agent: Please be advised that RX refills may take up to 3 business days. We ask that you follow-up with your pharmacy.

## 2017-06-01 NOTE — Telephone Encounter (Signed)
Contacted pt regarding prescription request; she states that she would like to have her prescription for buspar be changed from her local pharmacy to [facility]; see CRM # [phone]; request sent.

## 2017-06-06 ENCOUNTER — Telehealth (INDEPENDENT_AMBULATORY_CARE_PROVIDER_SITE_OTHER): Payer: Self-pay | Admitting: *Deleted

## 2017-06-06 NOTE — Telephone Encounter (Signed)
Per Dr. Durward Fortes, he doesn't feel like surgery is the best option for patient. He would like for patient to get another option before having surgery. I called patient and offered to refer to her to Dr. Erlinda Hong or another orthopedic surgery for a second option. Patient became very angry. Patient does not want to be referred for another opinion. She stated "doctor's are political and worthless." She said she is not wasting her time, gas and money on another co-pay.

## 2017-06-06 NOTE — Telephone Encounter (Signed)
Patient called, no patient assistance available for viscosupplementation, patient cannot afford viscosupplementation and wants to have surgery, left knee, patient very upset. Thank you.

## 2017-06-08 NOTE — Telephone Encounter (Signed)
I called patient. Patient cannot afford viscosupplementation. Patient stated the patient asisstance program ran out of money so she cannot get any assistance with funding. Patient wants surgery. Dr. Durward Fortes doesn't think this patient is a candidate for surgery. Dr. Durward Fortes suggested referring patient to Dr. Erlinda Hong for a second opinion. Patient refused. She said she is not wasting her time, gas or copay on another option.

## 2017-06-16 DIAGNOSIS — Z9884 Bariatric surgery status: Secondary | ICD-10-CM | POA: Diagnosis not present

## 2017-06-16 DIAGNOSIS — M1712 Unilateral primary osteoarthritis, left knee: Secondary | ICD-10-CM | POA: Diagnosis not present

## 2017-06-16 DIAGNOSIS — J019 Acute sinusitis, unspecified: Secondary | ICD-10-CM | POA: Diagnosis not present

## 2017-06-16 DIAGNOSIS — G510 Bell's palsy: Secondary | ICD-10-CM | POA: Diagnosis not present

## 2017-06-16 DIAGNOSIS — E785 Hyperlipidemia, unspecified: Secondary | ICD-10-CM | POA: Diagnosis not present

## 2017-06-16 DIAGNOSIS — F419 Anxiety disorder, unspecified: Secondary | ICD-10-CM | POA: Diagnosis not present

## 2017-06-30 DIAGNOSIS — E785 Hyperlipidemia, unspecified: Secondary | ICD-10-CM | POA: Diagnosis not present

## 2017-06-30 DIAGNOSIS — Z0000 Encounter for general adult medical examination without abnormal findings: Secondary | ICD-10-CM | POA: Diagnosis not present

## 2017-06-30 DIAGNOSIS — Z6836 Body mass index (BMI) 36.0-36.9, adult: Secondary | ICD-10-CM | POA: Diagnosis not present

## 2017-06-30 DIAGNOSIS — F419 Anxiety disorder, unspecified: Secondary | ICD-10-CM | POA: Diagnosis not present

## 2017-06-30 DIAGNOSIS — G510 Bell's palsy: Secondary | ICD-10-CM | POA: Diagnosis not present

## 2017-06-30 DIAGNOSIS — M1712 Unilateral primary osteoarthritis, left knee: Secondary | ICD-10-CM | POA: Diagnosis not present

## 2017-07-05 ENCOUNTER — Telehealth (INDEPENDENT_AMBULATORY_CARE_PROVIDER_SITE_OTHER): Payer: Self-pay | Admitting: Orthopaedic Surgery

## 2017-07-05 NOTE — Telephone Encounter (Signed)
[facility] left a voicemail regarding the prescription they received for the Euflexxa.  They need to verify the signature on the prescription.  Please call [phone]

## 2017-07-07 DIAGNOSIS — G510 Bell's palsy: Secondary | ICD-10-CM | POA: Diagnosis not present

## 2017-07-07 DIAGNOSIS — Z9884 Bariatric surgery status: Secondary | ICD-10-CM | POA: Diagnosis not present

## 2017-07-07 DIAGNOSIS — J019 Acute sinusitis, unspecified: Secondary | ICD-10-CM | POA: Diagnosis not present

## 2017-07-07 DIAGNOSIS — M1712 Unilateral primary osteoarthritis, left knee: Secondary | ICD-10-CM | POA: Diagnosis not present

## 2017-07-07 DIAGNOSIS — Z6836 Body mass index (BMI) 36.0-36.9, adult: Secondary | ICD-10-CM | POA: Diagnosis not present

## 2017-07-07 DIAGNOSIS — E785 Hyperlipidemia, unspecified: Secondary | ICD-10-CM | POA: Diagnosis not present

## 2017-07-07 DIAGNOSIS — Z0000 Encounter for general adult medical examination without abnormal findings: Secondary | ICD-10-CM | POA: Diagnosis not present

## 2017-07-07 DIAGNOSIS — F419 Anxiety disorder, unspecified: Secondary | ICD-10-CM | POA: Diagnosis not present

## 2017-07-07 NOTE — Telephone Encounter (Signed)
Cassandra Mcconnell working on.

## 2017-07-30 ENCOUNTER — Emergency Department (HOSPITAL_COMMUNITY)
Admission: EM | Admit: 2017-07-30 | Discharge: 2017-07-30 | Disposition: A | Discharge status: Home / Self Care | Payer: Medicare HMO | Attending: Emergency Medicine | Admitting: Emergency Medicine

## 2017-07-30 ENCOUNTER — Encounter (HOSPITAL_COMMUNITY): Payer: Self-pay | Admitting: Emergency Medicine

## 2017-07-30 ENCOUNTER — Other Ambulatory Visit: Payer: Self-pay

## 2017-07-30 DIAGNOSIS — I10 Essential (primary) hypertension: Secondary | ICD-10-CM | POA: Insufficient documentation

## 2017-07-30 DIAGNOSIS — T887XXA Unspecified adverse effect of drug or medicament, initial encounter: Secondary | ICD-10-CM | POA: Diagnosis not present

## 2017-07-30 DIAGNOSIS — Y658 Other specified misadventures during surgical and medical care: Secondary | ICD-10-CM | POA: Diagnosis not present

## 2017-07-30 DIAGNOSIS — Z79899 Other long term (current) drug therapy: Secondary | ICD-10-CM | POA: Insufficient documentation

## 2017-07-30 DIAGNOSIS — T43595A Adverse effect of other antipsychotics and neuroleptics, initial encounter: Secondary | ICD-10-CM | POA: Diagnosis not present

## 2017-07-30 DIAGNOSIS — R5383 Other fatigue: Secondary | ICD-10-CM | POA: Diagnosis not present

## 2017-07-30 DIAGNOSIS — T50905A Adverse effect of unspecified drugs, medicaments and biological substances, initial encounter: Secondary | ICD-10-CM | POA: Insufficient documentation

## 2017-07-30 LAB — URINALYSIS, ROUTINE W REFLEX MICROSCOPIC
BACTERIA UA: NONE SEEN
Bilirubin Urine: NEGATIVE
Glucose, UA: NEGATIVE mg/dL
HGB URINE DIPSTICK: NEGATIVE
Ketones, ur: NEGATIVE mg/dL
NITRITE: NEGATIVE
PROTEIN: NEGATIVE mg/dL
Specific Gravity, Urine: 1.023 (ref 1.005–1.030)
pH: 5 (ref 5.0–8.0)

## 2017-07-30 LAB — CBC WITH DIFFERENTIAL/PLATELET
BASOS ABS: 0 10*3/uL (ref 0.0–0.1)
Basophils Relative: 0 %
EOS ABS: 0.1 10*3/uL (ref 0.0–0.7)
EOS PCT: 2 %
HCT: 41.1 % (ref 36.0–46.0)
HEMOGLOBIN: 13.5 g/dL (ref 12.0–15.0)
Lymphocytes Relative: 32 %
Lymphs Abs: 2.2 10*3/uL (ref 0.7–4.0)
MCH: 28.7 pg (ref 26.0–34.0)
MCHC: 32.8 g/dL (ref 30.0–36.0)
MCV: 87.4 fL (ref 78.0–100.0)
Monocytes Absolute: 0.8 10*3/uL (ref 0.1–1.0)
Monocytes Relative: 12 %
Neutro Abs: 3.7 10*3/uL (ref 1.7–7.7)
Neutrophils Relative %: 54 %
PLATELETS: 215 10*3/uL (ref 150–400)
RBC: 4.7 MIL/uL (ref 3.87–5.11)
RDW: 13.7 % (ref 11.5–15.5)
WBC: 6.9 10*3/uL (ref 4.0–10.5)

## 2017-07-30 LAB — COMPREHENSIVE METABOLIC PANEL
ALBUMIN: 4.1 g/dL (ref 3.5–5.0)
ALK PHOS: 79 U/L (ref 38–126)
ALT: 33 U/L (ref 14–54)
AST: 33 U/L (ref 15–41)
Anion gap: 8 (ref 5–15)
BUN: 17 mg/dL (ref 6–20)
CHLORIDE: 110 mmol/L (ref 101–111)
CO2: 25 mmol/L (ref 22–32)
CREATININE: 0.61 mg/dL (ref 0.44–1.00)
Calcium: 9.8 mg/dL (ref 8.9–10.3)
GFR calc Af Amer: >60 mL/min (ref >60)
GFR calc non Af Amer: >60 mL/min (ref >60)
GLUCOSE: 81 mg/dL (ref 65–99)
Potassium: 4.3 mmol/L (ref 3.5–5.1)
SODIUM: 143 mmol/L (ref 135–145)
Total Bilirubin: 0.7 mg/dL (ref 0.3–1.2)
Total Protein: 7 g/dL (ref 6.5–8.1)

## 2017-07-30 MED ORDER — OLOPATADINE HCL 0.1 % OP SOLN: 1.0000 [drp] | Freq: Two times a day (BID) | OPHTHALMIC | 12 refills | Status: DC

## 2017-07-30 NOTE — ED Triage Notes (Signed)
Patient seen PCP on March 25th, medication increase of Buspar from 60mg  to 90mg  and started on xanax, after reportong symptoms of dizziness, SOB, chest pain, anxiety, tremors, and blurred vision. Patient states that PCP called 5 days later and told that she needed to reduce the Buspar back to 60mg  because she could be experiencing side effects. Patient states she stopped taking all her medications x2 weeks- Neurontin. Buspar, Celexa, and Xanax. Patient states "eyes blood shot," shooting pain behind left ear, fatigue, and "locked jaw."

## 2017-07-30 NOTE — Discharge Instructions (Addendum)
Your testing was normal - no signs of blood abnormlaities - urine sample was normal ER for worsening symptoms.   See your doctor to discuss your medicines on Monday or Tuesday   Patanol 1 drop in each every every 12 hours to help with redness / itching and pain Zyrtec daily at night (10mg )

## 2017-07-30 NOTE — ED Provider Notes (Signed)
[facility] EMERGENCY DEPARTMENT Provider Note   CSN: [id number] Arrival date & time: 07/30/17  4174     History   Chief Complaint Chief Complaint  Patient presents with  . Medication Reaction    HPI Cassandra Mcconnell is a 52 y.o. female.  The history is provided by the patient and the spouse.    The patient is a 52 year old female, she presents with a chief complaint of dizziness and fatigue.  The patient reports that she has been chronically treated for anxiety and depression with BuSpar, she was recently increased from her dose of 60 mg a day to 90 mg a day as well as starting on Xanax for increased anxiety, and reports that soon after doing that she started to feel increased amounts of fatigue generalized weakness, dizziness, feeling like she was in a fog and then noticed that she developed conjunctivitis with some occasional discomfort in her eyes but no drainage or discharge.  She has had some slight blurry vision intermittently with this as well despite using corrective lenses.  She is also having some runny nose, the occasional cough and some seasonal allergies but is not taking an antihistamine.  After 5 days on the increased dose of BuSpar she discussed this with her family doctor who told her to go back to 60 mg but her symptoms have persisted over the last 4 or 5 days.  She denies dysuria diarrhea fevers chills blood in the stool, swelling of the legs or rashes.  Additional history from the spouse who states that her symptoms have been persistent without any significant improvement.  She does use organic drops for her dry eyes chronically and has continued to use them, no other new eyedrops.  She is no longer taking the Xanax because she did not like the way it made her feel  Past Medical History:  Diagnosis Date  . Anxiety   . Back pain   . Bell's palsy    Left side, 2009, 2017  . Depression   . Diverticulitis   . GERD (gastroesophageal reflux disease)   . History  of hiatal hernia   . Hyperlipidemia   . Hypertension   . Postoperative nausea 04/21/2016  . Wears glasses     Patient Active Problem List   Diagnosis Date Noted  . Left facial numbness 04/21/2017  . Allergic rhinitis 03/14/2017  . Status post left knee surgery 02/20/2017  . Left knee pain 02/20/2017  . Hypotension 09/12/2016  . Fatigue 08/22/2016  . Postoperative nausea 04/21/2016  . Onychomycosis 10/15/2015  . Bell's palsy 09/29/2015  . Cutaneous skin tags 02/18/2015  . Complex ovarian cyst 10/15/2014  . Diverticulitis 10/12/2014  . Severe obesity (BMI >= 40) ([place]) 05/26/2014  . Depression 05/31/2013  . Morbid obesity ([place]) 12/04/2009  . 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 HYPERTROPHY, BREAST 11/20/2006  . Anxiety state 11/10/2006  . GERD 11/10/2006  . HYPERCHOLESTEROLEMIA, PURE 11/09/2006  . HYPERTENSION, BENIGN ESSENTIAL 11/09/2006  . CHOLECYSTECTOMY, HX OF 11/09/2006    Past Surgical History:  Procedure Laterality Date  . BACK SURGERY  2004   l4/5 fusion  . BREAST REDUCTION SURGERY Bilateral 08/08/2014   Procedure: BILATERAL BREAST REDUCTION  (BREAST);  Surgeon: Irene Limbo, MD;  Location: [place];  Service: Plastics;  Laterality: Bilateral;  . CHOLECYSTECTOMY     1998  . LAPAROSCOPIC BILATERAL SALPINGO OOPHERECTOMY Bilateral 03/11/2015   Procedure: LAPAROSCOPIC BILATERAL SALPINGO OOPHORECTOMY;  Surgeon: Florian Buff, MD;  Location: [facility];  Service: Gynecology;  Laterality: Bilateral;  . LAPAROSCOPIC GASTRIC SLEEVE RESECTION WITH HIATAL HERNIA REPAIR N/A 03/22/2016   Procedure: LAPAROSCOPIC GASTRIC SLEEVE RESECTION WITH HIATAL HERNIA REPAIR WITHUPPER ENDOSCOPY;  Surgeon: Alphonsa Overall, MD;  Location: [facility];  Service: General;  Laterality: N/A;  . SEPTOPLASTY  1988  . tonsillectomy  1991  . TONSILLECTOMY       OB History   None      Home Medications    Prior to Admission medications   Medication Sig Start Date End Date  Taking? Authorizing Provider  acetaminophen (TYLENOL) 500 MG tablet Take 1,000 mg by mouth every 6 (six) hours as needed. For pain    Yes [provider]  Biotin 1 MG CAPS Take 1 capsule by mouth daily.    Yes [provider]  citalopram (CELEXA) 20 MG tablet TAKE 1 TABLET TWICE DAILY Patient taking differently: TAKE 1 TABLET  DAILY 02/17/17  Yes Lucille Passy, MD  cyanocobalamin 1000 MCG tablet Take 1,000 mcg by mouth daily.   Yes [provider]  docusate sodium (COLACE) 100 MG capsule Take 100-200 mg by mouth daily.    Yes [provider]  Multiple Vitamin (MULTIVITAMIN) capsule Take 1 capsule by mouth daily.   Yes [provider]  polyvinyl alcohol-povidone (REFRESH) 1.4-0.6 % ophthalmic solution Place 1-2 drops into both eyes daily as needed (for dry eye relief).    Yes [provider]  promethazine (PHENERGAN) 25 MG tablet Take 1 tablet (25 mg total) by mouth every 6 (six) hours as needed. 08/29/16  Yes Fredia Sorrow, MD  triamcinolone cream (KENALOG) 0.1 % APPLY TO AFFECTED AREA TWICE DAILY. 11/23/16  Yes Lucille Passy, MD  busPIRone (BUSPAR) 30 MG tablet Take 1 tablet (30 mg total) by mouth 2 (two) times daily. Patient not taking: Reported on 07/30/2017 06/01/17   Lucille Passy, MD  gabapentin (NEURONTIN) 100 MG capsule 100mg  during the day 200mg  at night Patient not taking: Reported on 07/30/2017 04/20/17   Dennie Bible, NP    Family History Family History  Problem Relation Age of Onset  . COPD Mother   . Hypertension Mother   . GER disease Mother   . Heart attack Father   . Prostate cancer Father   . Hyperlipidemia Brother   . Hypertension Brother   . Diabetes Brother   . Neuropathy Brother   . Hyperlipidemia Brother   . Hypertension Brother   . Heart attack Paternal Grandmother   . Heart attack Paternal Grandfather   . Stroke Paternal Grandfather   . Colon cancer Neg Hx   . Stomach cancer Neg Hx     Social  History Social History   Tobacco Use  . Smoking status: Never Smoker  . Smokeless tobacco: Never Used  Substance Use Topics  . Alcohol use: No  . Drug use: No     Allergies   Penicillins; Zofran [ondansetron hcl]; Azithromycin; Cimetidine; Conjugated estrogens; Cyclobenzaprine hcl; Dicyclomine hcl; Flagyl [metronidazole]; Lisinopril; Norco [hydrocodone-acetaminophen]; Nsaids; Paroxetine; Percocet [oxycodone-acetaminophen]; Rocephin [ceftriaxone]; Rosuvastatin; Sulfa antibiotics; Wellbutrin [bupropion hcl]; Ciprofloxacin; Doxycycline hyclate; Erythromycin base; and Prednisone   Review of Systems Review of Systems  All other systems reviewed and are negative.    Physical Exam Updated Vital Signs BP 119/70 (BP Location: Right Arm)   Pulse 73   Temp 97.6 F (36.4 C) (Oral)   Resp 18   Ht 5\' 4"  (1.626 m)   Wt 95.3 kg (210 lb)   SpO2 97%  BMI 36.05 kg/m   Physical Exam  Constitutional: She appears well-developed and well-nourished. No distress.  HENT:  Head: Normocephalic and atraumatic.  Mouth/Throat: Oropharynx is clear and moist. No oropharyngeal exudate.  Eyes: Pupils are equal, round, and reactive to light. EOM are normal. Right eye exhibits no discharge. Left eye exhibits no discharge. No scleral icterus.  There is conjunctival injection of her bilateral eyes right greater than left, pupillary exam is normal, there is no discharge drainage or periorbital swelling.  There is no consensual pain, no photophobia  Neck: Normal range of motion. Neck supple. No JVD present. No thyromegaly present.  Cardiovascular: Normal rate, regular rhythm, normal heart sounds and intact distal pulses. Exam reveals no gallop and no friction rub.  No murmur heard. Pulmonary/Chest: Effort normal and breath sounds normal. No respiratory distress. She has no wheezes. She has no rales.  Abdominal: Soft. Bowel sounds are normal. She exhibits no distension and no mass. There is no tenderness.    Musculoskeletal: Normal range of motion. She exhibits no edema or tenderness.  Lymphadenopathy:    She has no cervical adenopathy.  Neurological: She is alert. Coordination normal.  Skin: Skin is warm and dry. No rash noted. No erythema.  Psychiatric: She has a normal mood and affect. Her behavior is normal.  Nursing note and vitals reviewed.    ED Treatments / Results  Labs (all labs ordered are listed, but only abnormal results are displayed) Labs Reviewed  URINALYSIS, ROUTINE W REFLEX MICROSCOPIC - Abnormal; Notable for the following components:      Result Value   Color, Urine AMBER (*)    APPearance CLOUDY (*)    Leukocytes, UA MODERATE (*)    Squamous Epithelial / LPF 0-5 (*)    All other components within normal limits  CBC WITH DIFFERENTIAL/PLATELET  COMPREHENSIVE METABOLIC PANEL    EKG EKG Interpretation  Date/Time:  Sunday July 30 2017 10:18:18 EDT Ventricular Rate:  71 PR Interval:    QRS Duration: 82 QT Interval:  387 QTC Calculation: 421 R Axis:   56 Text Interpretation:  Sinus rhythm Normal ECG since last tracing no significant change Confirmed by Noemi Chapel [phone]) on 07/30/2017 10:26:11 AM   Radiology No results found.  Procedures Procedures (including critical care time)  Medications Ordered in ED Medications - No data to display   Initial Impression / Assessment and Plan / ED Course  I have reviewed the triage vital signs and the nursing notes.  Pertinent labs & imaging results that were available during my care of the patient were reviewed by me and considered in my medical decision making (see chart for details).  Clinical Course as of Jul 31 1119  Sun Jul 30, 2017  1119 Lab work is negative for urinary infection, metabolic panel liver function panel and CBC are also normal showing no signs of abnormalities elect light disturbances renal failure or liver failure.   [BM]    Clinical Course User Index [BM] Noemi Chapel, MD   The  patient exam is unremarkable, she had increased her dosing into a range above the recommended dose of 60 mg which suggest that her symptoms may be related to that medication.  That being said it is hard to know if there could be some underlying dysfunction either related to that medication or something else so we will check renal function liver function blood counts and a urinalysis.  Her cardiac and pulmonary exam as well as her neurologic exams are unremarkable and I  suspect that she does have some seasonal allergies with coughing runny nose and some conjunctival injection.  We will start her on some antihistamine eyedrops as well as oral Zyrtec which she has not been taking.  The patient is amenable to this plan.  She appears to be nontoxic at this time.  Patient was informed of the results, she is stable for discharge, no indication for treatment of anything specific at this time.  She will need to have her medications changed in the outpatient setting most likely  Final Clinical Impressions(s) / ED Diagnoses   Final diagnoses:  Fatigue, unspecified type  Adverse effect of drug, initial encounter    ED Discharge Orders    None       Noemi Chapel, MD 07/30/17 1121

## 2017-11-16 ENCOUNTER — Ambulatory Visit (HOSPITAL_COMMUNITY)
Admission: RE | Admit: 2017-11-16 | Discharge: 2017-11-16 | Disposition: A | Discharge status: Home / Self Care | Payer: Medicare HMO | Source: Ambulatory Visit | Attending: Internal Medicine | Admitting: Internal Medicine

## 2017-11-16 ENCOUNTER — Other Ambulatory Visit: Payer: Self-pay | Admitting: Internal Medicine

## 2017-11-16 DIAGNOSIS — M25522 Pain in left elbow: Secondary | ICD-10-CM

## 2017-11-16 DIAGNOSIS — M25572 Pain in left ankle and joints of left foot: Secondary | ICD-10-CM | POA: Insufficient documentation

## 2017-11-16 DIAGNOSIS — W19XXXA Unspecified fall, initial encounter: Secondary | ICD-10-CM | POA: Insufficient documentation

## 2017-11-16 DIAGNOSIS — M25562 Pain in left knee: Secondary | ICD-10-CM | POA: Diagnosis not present

## 2017-11-16 DIAGNOSIS — S59902A Unspecified injury of left elbow, initial encounter: Secondary | ICD-10-CM | POA: Diagnosis not present

## 2017-11-16 DIAGNOSIS — G8929 Other chronic pain: Secondary | ICD-10-CM | POA: Insufficient documentation

## 2017-11-16 DIAGNOSIS — S8992XA Unspecified injury of left lower leg, initial encounter: Secondary | ICD-10-CM | POA: Diagnosis not present

## 2017-11-16 DIAGNOSIS — S92425A Nondisplaced fracture of distal phalanx of left great toe, initial encounter for closed fracture: Secondary | ICD-10-CM | POA: Insufficient documentation

## 2017-11-16 DIAGNOSIS — M79672 Pain in left foot: Secondary | ICD-10-CM

## 2017-11-16 DIAGNOSIS — S92415A Nondisplaced fracture of proximal phalanx of left great toe, initial encounter for closed fracture: Secondary | ICD-10-CM | POA: Diagnosis not present

## 2017-11-16 DIAGNOSIS — S99912A Unspecified injury of left ankle, initial encounter: Secondary | ICD-10-CM | POA: Diagnosis not present

## 2017-11-21 ENCOUNTER — Encounter (INDEPENDENT_AMBULATORY_CARE_PROVIDER_SITE_OTHER): Payer: Self-pay | Admitting: Orthopedic Surgery

## 2017-11-21 ENCOUNTER — Ambulatory Visit (INDEPENDENT_AMBULATORY_CARE_PROVIDER_SITE_OTHER): Payer: Medicare HMO | Admitting: Orthopedic Surgery

## 2017-11-21 ENCOUNTER — Ambulatory Visit (HOSPITAL_COMMUNITY)
Admission: RE | Admit: 2017-11-21 | Discharge: 2017-11-21 | Disposition: A | Discharge status: Home / Self Care | Payer: Medicare HMO | Source: Ambulatory Visit | Attending: Orthopedic Surgery | Admitting: Orthopedic Surgery

## 2017-11-21 VITALS — BP 116/76 | HR 77 | Ht 64.0 in | Wt 217.0 lb

## 2017-11-21 DIAGNOSIS — M79662 Pain in left lower leg: Secondary | ICD-10-CM

## 2017-11-21 DIAGNOSIS — S92425A Nondisplaced fracture of distal phalanx of left great toe, initial encounter for closed fracture: Secondary | ICD-10-CM

## 2017-11-21 DIAGNOSIS — S5002XA Contusion of left elbow, initial encounter: Secondary | ICD-10-CM | POA: Diagnosis not present

## 2017-11-21 DIAGNOSIS — M1712 Unilateral primary osteoarthritis, left knee: Secondary | ICD-10-CM | POA: Diagnosis not present

## 2017-11-21 DIAGNOSIS — S8255XA Nondisplaced fracture of medial malleolus of left tibia, initial encounter for closed fracture: Secondary | ICD-10-CM | POA: Diagnosis not present

## 2017-11-21 NOTE — Progress Notes (Signed)
Office Visit Note   Patient: Cassandra Mcconnell           Date of Birth: 11-21-1965           MRN: [id number] Visit Date: 11/21/2017              Requested by: Celene Squibb, MD [place], [place] [address] PCP: Celene Squibb, MD   Assessment & Plan: Visit Diagnoses:  1. Closed nondisplaced fracture of distal phalanx of left great toe, initial encounter   2. Closed nondisplaced fracture of medial malleolus of left tibia, initial encounter   3. Unilateral primary osteoarthritis, left knee   4. Pain of left calf   5. Contusion of left elbow, initial encounter   6. Tenderness of left calf     Plan:  #1: At this time we are going to obtain a Doppler of her left calf to rule out DVT.  She is tender mainly at the midportion of the calf.  This will be done up in the [place] area. #2Margarito Courser boot to the left ankle and foot #3: Sling to left arm for comfort #4: May need to consider MRI scanning and x-ray of the lumbar spine in the future.  Apparently she has had problems with this in the past.  Follow-Up Instructions: Return in about 1 week (around 11/28/2017).   Face-to-face time spent with patient was greater than 30 minutes.  Greater than 50% of the time was spent in counseling and coordination of care.  Orders:  Orders Placed This Encounter  Procedures  . US Venous Img Lower Unilateral Left   No orders of the defined types were placed in this encounter.     Procedures: No procedures performed   Clinical Data: No additional findings.   Subjective: Chief Complaint  Patient presents with  . New Patient (Initial Visit)    pt fell 11/13/17 feet went inward and leg bent backwards. having left knee and elbow pain    HPI  Cassandra Mcconnell is seen today with multiple complaints.  Apparently on 22 July she fell and sustained a hyperflexion valgus injury to her left knee and foot.  She also struck the lateral aspect of her elbow on the floor.  At that time she had injured her left  ankle with pain along the medial aspect of the ankle.  She is also having pain and discomfort in the left knee and left lateral elbow.  In addition she had injured the left great toe.  She continues to have pain along the areas described.  She also states that she has some numbness in the plantar aspect of the foot.  She has had a history of previous back problems.    Review of Systems  Constitutional: Negative for fatigue and fever.  HENT: Positive for ear pain.   Eyes: Positive for pain.  Respiratory: Negative for cough and shortness of breath.   Cardiovascular: Positive for leg swelling.  Gastrointestinal: Negative for constipation and diarrhea.  Genitourinary: Negative for difficulty urinating.  Musculoskeletal: Positive for back pain. Negative for neck pain.  Skin: Negative for rash.  Allergic/Immunologic: Negative for food allergies.  Neurological: Positive for weakness and numbness.  Hematological: Does not bruise/bleed easily.  Psychiatric/Behavioral: Negative for sleep disturbance.     Objective: Vital Signs: BP 116/76 (BP Location: Left Arm, Patient Position: Sitting, Cuff Size: Normal)   Pulse 77   Ht 5\' 4"  (1.626 m)   Wt 217 lb (98.4 kg)  BMI 37.25 kg/m   Physical Exam  Constitutional: She is oriented to person, place, and time. She appears well-developed and well-nourished.  HENT:  Mouth/Throat: Oropharynx is clear and moist.  Eyes: Pupils are equal, round, and reactive to light. EOM are normal.  Pulmonary/Chest: Effort normal.  Neurological: She is alert and oriented to person, place, and time.  Skin: Skin is warm and dry.  Psychiatric: She has a normal mood and affect. Her behavior is normal.    Ortho Exam  Left knee reveals range of motion from near full extension to around 105 degrees.  She does have some diffuse tenderness about both joint lines.  Trace effusion.  No warmth or erythema.  Left ankle reveals tenderness to palpation over the medial  malleolus.  She does have a little bit of swelling about the lateral ankle.  She is tender to palpation over the ATF and CF ligaments.  She does have a mildly positive drawer and inversion.  Which is cause her pain.  I dopplered her foot and she has excellent pulses on Doppler.  No medial pain per se.  Left calf reveals tenderness palpation in the midportion of the gastroc.  Left foot reveals tenderness over the IP joint of the great toe with pain at the distal phalanx.  Good capillary refill.  No real pain in the metatarsal area.  She has good sensation in the foot both dorsally and plantar.  She states her numbness is in the plantar aspect of the foot.  Again excellent pulses on Doppler.  Left elbow today's reveals tenderness to palpation over the lateral epicondylar area.  She has essentially full range of motion flexion extension.  Pronates and supinates to around 60 to 70 degrees.  She has good strength with dorsi and palmar flexion of the wrist.  Questionable swelling over the lateral epicondylar area.  Neurovascular intact distally.  Specialty Comments:  No specialty comments available.  Imaging: Dg Elbow 2 Views Left  Result Date: 11/17/2017 CLINICAL DATA:  Fall with elbow pain EXAM: LEFT ELBOW - 2 VIEW COMPARISON:  None. FINDINGS: There is no evidence of fracture, dislocation, or joint effusion. There is no evidence of arthropathy or other focal bone abnormality. Soft tissues are unremarkable. IMPRESSION: Negative. Electronically Signed   By: Donavan Foil M.D.   On: 11/17/2017 02:13   Dg Knee 1-2 Views Left  Result Date: 11/17/2017 CLINICAL DATA:  Recent fall with ankle pain EXAM: LEFT KNEE - 1-2 VIEW COMPARISON:  01/29/2016 FINDINGS: No fracture or malalignment. Mild patellofemoral and medial degenerative changes. No large knee effusion. IMPRESSION: No acute osseous abnormality Electronically Signed   By: Donavan Foil M.D.   On: 11/17/2017 02:12   Dg Ankle Complete Left  Result  Date: 11/17/2017 CLINICAL DATA:  Fall with ankle pain EXAM: LEFT ANKLE COMPLETE - 3+ VIEW COMPARISON:  None. FINDINGS: Moderate plantar calcaneal spur. Tiny age indeterminate avulsion soft the medial malleolus. Ankle mortise is symmetric IMPRESSION: Tiny age indeterminate avulsion injury off the medial malleolus. Electronically Signed   By: Donavan Foil M.D.   On: 11/17/2017 02:13   Dg Foot 2 Views Left  Result Date: 11/17/2017 CLINICAL DATA:  Fall with foot pain EXAM: LEFT FOOT - 2 VIEW COMPARISON:  None. FINDINGS: Acute nondisplaced fracture involving the proximal shaft and base of the first distal phalanx with extension to the articular surface of the PIP joint. Moderate plantar calcaneal spur. No subluxation. IMPRESSION: Acute nondisplaced intra-articular fracture involving the proximal shaft and base of  the first distal phalanx. Electronically Signed   By: Donavan Foil M.D.   On: 11/17/2017 02:14   X-ray of the left elbow does reveal some ossicle formation of the lateral epicondylar area.  Not seen any fractures.  PMFS History: Patient Active Problem List   Diagnosis Date Noted  . Unilateral primary osteoarthritis, left knee 11/21/2017  . Contusion of left elbow 11/21/2017  . Left facial numbness 04/21/2017  . Allergic rhinitis 03/14/2017  . Status post left knee surgery 02/20/2017  . Left knee pain 02/20/2017  . Hypotension 09/12/2016  . Fatigue 08/22/2016  . Postoperative nausea 04/21/2016  . Onychomycosis 10/15/2015  . Bell's palsy 09/29/2015  . Cutaneous skin tags 02/18/2015  . Complex ovarian cyst 10/15/2014  . Diverticulitis 10/12/2014  . Severe obesity (BMI >= 40) ([place]) 05/26/2014  . Depression 05/31/2013  . Morbid obesity ([place]) 12/04/2009  . 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 HYPERTROPHY, BREAST 11/20/2006  . Anxiety state 11/10/2006  . GERD 11/10/2006  . HYPERCHOLESTEROLEMIA, PURE 11/09/2006  . HYPERTENSION, BENIGN ESSENTIAL 11/09/2006  . CHOLECYSTECTOMY,  HX OF 11/09/2006   Past Medical History:  Diagnosis Date  . Anxiety   . Back pain   . Bell's palsy    Left side, 2009, 2017  . Depression   . Diverticulitis   . GERD (gastroesophageal reflux disease)   . History of hiatal hernia   . Hyperlipidemia   . Hypertension   . Postoperative nausea 04/21/2016  . Wears glasses     Family History  Problem Relation Age of Onset  . COPD Mother   . Hypertension Mother   . GER disease Mother   . Heart attack Father   . Prostate cancer Father   . Hyperlipidemia Brother   . Hypertension Brother   . Diabetes Brother   . Neuropathy Brother   . Hyperlipidemia Brother   . Hypertension Brother   . Heart attack Paternal Grandmother   . Heart attack Paternal Grandfather   . Stroke Paternal Grandfather   . Colon cancer Neg Hx   . Stomach cancer Neg Hx     Past Surgical History:  Procedure Laterality Date  . BACK SURGERY  2004   l4/5 fusion  . BREAST REDUCTION SURGERY Bilateral 08/08/2014   Procedure: BILATERAL BREAST REDUCTION  (BREAST);  Surgeon: Irene Limbo, MD;  Location: [place];  Service: Plastics;  Laterality: Bilateral;  . CHOLECYSTECTOMY     1998  . LAPAROSCOPIC BILATERAL SALPINGO OOPHERECTOMY Bilateral 03/11/2015   Procedure: LAPAROSCOPIC BILATERAL SALPINGO OOPHORECTOMY;  Surgeon: Florian Buff, MD;  Location: [facility];  Service: Gynecology;  Laterality: Bilateral;  . LAPAROSCOPIC GASTRIC SLEEVE RESECTION WITH HIATAL HERNIA REPAIR N/A 03/22/2016   Procedure: LAPAROSCOPIC GASTRIC SLEEVE RESECTION WITH HIATAL HERNIA REPAIR WITHUPPER ENDOSCOPY;  Surgeon: Alphonsa Overall, MD;  Location: [facility];  Service: General;  Laterality: N/A;  . SEPTOPLASTY  1988  . tonsillectomy  1991  . TONSILLECTOMY     Social History   Occupational History  . Occupation: stay at home mom  Tobacco Use  . Smoking status: Never Smoker  . Smokeless tobacco: Never Used  Substance and Sexual Activity  . Alcohol use: No  . Drug use: No  .  Sexual activity: Yes    Partners: Male

## 2017-11-22 ENCOUNTER — Encounter (INDEPENDENT_AMBULATORY_CARE_PROVIDER_SITE_OTHER): Payer: Self-pay | Admitting: Orthopedic Surgery

## 2017-11-27 ENCOUNTER — Ambulatory Visit: Payer: Medicare HMO | Admitting: Neurology

## 2017-11-27 ENCOUNTER — Encounter: Payer: Self-pay | Admitting: Neurology

## 2017-11-27 ENCOUNTER — Telehealth: Payer: Self-pay | Admitting: Neurology

## 2017-11-27 ENCOUNTER — Encounter

## 2017-11-27 VITALS — BP 114/62 | HR 60 | Ht 64.0 in

## 2017-11-27 DIAGNOSIS — H02402 Unspecified ptosis of left eyelid: Secondary | ICD-10-CM

## 2017-11-27 DIAGNOSIS — G510 Bell's palsy: Secondary | ICD-10-CM

## 2017-11-27 DIAGNOSIS — R200 Anesthesia of skin: Secondary | ICD-10-CM | POA: Diagnosis not present

## 2017-11-27 DIAGNOSIS — R29810 Facial weakness: Secondary | ICD-10-CM

## 2017-11-27 DIAGNOSIS — R202 Paresthesia of skin: Secondary | ICD-10-CM

## 2017-11-27 MED ORDER — GABAPENTIN 100 MG PO CAPS: ORAL_CAPSULE | ORAL | 3 refills | Status: DC

## 2017-11-27 NOTE — Patient Instructions (Addendum)
Continue medication MRI of the brain Lab today Will refer to surgery

## 2017-11-27 NOTE — Progress Notes (Signed)
[facility]    Provider:  Dr Jaynee Eagles Referring Provider: Celene Squibb, MD Primary Care Physician:  Celene Squibb, MD  CC:  Left-sided facial droop dxed with Bell's Palsy but with atypical features, MRI was ordered but patient did not complete   Interval history 11/27/2017: Patient with Bell's Palsy(2009 and 2017). She takes gabapentin for nerve pain. She has been stable for several years. She is under significant stress with her son who was hospitalized for psychiatric issues. MRI of the brain was ordered in 03/2017 but was not completed by patient.  She has spasms on the left side of the face, no pain, Likely synkinesis. She has hand numbness recommended wrist splints and can consider emg/ncs in the future.   HPI:  Cassandra Mcconnell is a 52 y.o. female here as a referral from Dr. Nevada Crane for Bell's palsy. Past medical history of anxiety, hyperlipidemia, hypertension, depression, Bell's palsy. She started getting a headache last Monday and Tuesday she noticed weakness, she woke up Wednesday with weakness and ear pain. No inciting events, no trauma, no rash or other. She is having associated pain in the left ear. Things sound louder. It is giving her a headache. tylenol and ibuprofen not helping. She has a history of remote bell's palsy that cleared up in 2 weeks. Patient went to the ED and she was started on prednisone and valtrex. She is using drops in the eyes to lubricate, she is aware of the risk of drying out and corneal damage, wearing glasses all the time to protect her eyes given decrease in blink rate (discussed all these again and the need for vigilance). She can shut her eye and so her eye is closed all night, no dry eye in the morning. No inciting event or head trauma or illness. Her eye is blurry on the left. She has been stressed. She has left facial droop, difficulty closing the left eye with decreased blink, blurry left eye vision. No other focal neurologic symptoms or  complaints.  Reviewed notes, labs and imaging from outside physicians, which showed:  CT of the head in 2009 showed No acute intracranial abnormalities including mass lesion or mass effect, hydrocephalus, extra-axial fluid collection, midline shift, hemorrhage, or acute infarction, large ischemic events (personally reviewed images)  Reviewed records. Patient presented to the emergency room on 09/22/2015. She complained of constant, gradually worsening left-sided facial droop upon awakening that morning. Associated symptoms were headache, fatigue and left ear pain. The pain in the left ear began followed by draping. She has a history of Bell's palsy with right-sided facial droop in the past. Denied any other complaints. Exam was significant for facial droop on the left, and ability to wrinkle forehead on the left, intact sensation. She was diagnosed with Bell's palsy. Supportive care and treatment was recommended.  Review of Systems: Patient complains of symptoms per HPI as well as the following symptoms: Blurred vision, headache, numbness, weakness, slurred speech, anxiety. Pertinent negatives per HPI. All others negative.   Social History   Socioeconomic History  . Marital status: Married    Spouse name: Not on file  . Number of children: 1  . Years of education: Not on file  . Highest education level: Not on file  Occupational History  . Occupation: stay at home mom  Social Needs  . Financial resource strain: Not on file  . Food insecurity:    Worry: Not on file    Inability: Not on file  . Transportation needs:  Medical: Not on file    Non-medical: Not on file  Tobacco Use  . Smoking status: Never Smoker  . Smokeless tobacco: Never Used  Substance and Sexual Activity  . Alcohol use: No  . Drug use: No  . Sexual activity: Yes    Partners: Male    Birth control/protection: Post-menopausal  Lifestyle  . Physical activity:    Days per week: Not on file    Minutes per  session: Not on file  . Stress: Not on file  Relationships  . Social connections:    Talks on phone: Not on file    Gets together: Not on file    Attends religious service: Not on file    Active member of club or organization: Not on file    Attends meetings of clubs or organizations: Not on file    Relationship status: Not on file  . Intimate partner violence:    Fear of current or ex partner: Not on file    Emotionally abused: Not on file    Physically abused: Not on file    Forced sexual activity: Not on file  Other Topics Concern  . Not on file  Social History Narrative   Lives at home with her husband   Left handed   Caffeine: 4-8 cups of coffee daily    Family History  Problem Relation Age of Onset  . COPD Mother   . Hypertension Mother   . GER disease Mother   . Heart attack Father   . Prostate cancer Father   . Hyperlipidemia Brother   . Hypertension Brother   . Diabetes Brother   . Neuropathy Brother   . Hyperlipidemia Brother   . Hypertension Brother   . Heart attack Paternal Grandmother   . Heart attack Paternal Grandfather   . Stroke Paternal Grandfather   . Colon cancer Neg Hx   . Stomach cancer Neg Hx     Past Medical History:  Diagnosis Date  . Ankle fracture    left  . Anxiety   . Back pain   . Bell's palsy    Left side, 2009, 2017  . Depression   . Diverticulitis   . GERD (gastroesophageal reflux disease)   . History of hiatal hernia   . Hyperlipidemia   . Hypertension   . Postoperative nausea 04/21/2016  . Toe fracture    left great toe  . Wears glasses     Past Surgical History:  Procedure Laterality Date  . BACK SURGERY  2004   l4/5 fusion  . BREAST REDUCTION SURGERY Bilateral 08/08/2014   Procedure: BILATERAL BREAST REDUCTION  (BREAST);  Surgeon: Irene Limbo, MD;  Location: [place];  Service: Plastics;  Laterality: Bilateral;  . CHOLECYSTECTOMY     1998  . LAPAROSCOPIC BILATERAL SALPINGO OOPHERECTOMY  Bilateral 03/11/2015   Procedure: LAPAROSCOPIC BILATERAL SALPINGO OOPHORECTOMY;  Surgeon: Florian Buff, MD;  Location: [facility];  Service: Gynecology;  Laterality: Bilateral;  . LAPAROSCOPIC GASTRIC SLEEVE RESECTION WITH HIATAL HERNIA REPAIR N/A 03/22/2016   Procedure: LAPAROSCOPIC GASTRIC SLEEVE RESECTION WITH HIATAL HERNIA REPAIR WITHUPPER ENDOSCOPY;  Surgeon: Alphonsa Overall, MD;  Location: [facility];  Service: General;  Laterality: N/A;  . SEPTOPLASTY  1988  . tonsillectomy  1991  . TONSILLECTOMY      Current Outpatient Medications  Medication Sig Dispense Refill  . acetaminophen (TYLENOL) 500 MG tablet Take 1,000 mg by mouth every 6 (six) hours as needed. For pain     .  Biotin 1 MG CAPS Take 1 capsule by mouth daily.     Marland Kitchen CALCIUM PO Take 2 tablets by mouth daily.    . citalopram (CELEXA) 20 MG tablet TAKE 1 TABLET TWICE DAILY (Patient taking differently: TAKE 1 TABLET  DAILY) 180 tablet 2  . cyanocobalamin 1000 MCG tablet Take 1,000 mcg by mouth daily.    Marland Kitchen docusate sodium (COLACE) 100 MG capsule Take 100-200 mg by mouth daily.     Marland Kitchen gabapentin (NEURONTIN) 100 MG capsule 100mg  during the day 200mg  at night 270 capsule 3  . loratadine (CLARITIN) 10 MG tablet Take 10-20 mg by mouth daily.    . Multiple Vitamin (MULTIVITAMIN) capsule Take 1 capsule by mouth daily.    . pantoprazole (PROTONIX) 40 MG tablet     . polyvinyl alcohol-povidone (REFRESH) 1.4-0.6 % ophthalmic solution Place 1-2 drops into both eyes daily as needed (for dry eye relief).     . triamcinolone cream (KENALOG) 0.1 % APPLY TO AFFECTED AREA TWICE DAILY. 30 g 2   No current facility-administered medications for this visit.     Allergies as of 11/27/2017 - Review Complete 11/27/2017  Allergen Reaction Noted  . Penicillins Shortness Of Breath   . Zofran [ondansetron hcl] Shortness Of Breath 10/15/2014  . Azithromycin Swelling 12/29/2011  . Cimetidine Other (See Comments)   . Conjugated estrogens Other (See Comments)   .  Cyclobenzaprine hcl  03/12/2010  . Dicyclomine hcl Other (See Comments)   . Flagyl [metronidazole] Nausea Only 10/15/2014  . Lisinopril  06/07/2007  . Norco [hydrocodone-acetaminophen] Other (See Comments) 10/12/2014  . Nsaids Other (See Comments) 04/21/2016  . Paroxetine Other (See Comments)   . Percocet [oxycodone-acetaminophen]  10/15/2014  . Rocephin [ceftriaxone] Nausea Only 10/15/2014  . Rosuvastatin Dermatitis   . Sulfa antibiotics Hives and Itching 02/15/2011  . Wellbutrin [bupropion hcl] Other (See Comments) 02/15/2011  . Ciprofloxacin Itching and Rash   . Doxycycline hyclate Itching and Rash   . Erythromycin base Itching and Rash   . Prednisone Rash 10/31/2013    Vitals: Ht 5\' 4"  (1.626 m)   LMP 02/10/2015   BMI 37.25 kg/m  Last Weight:  Wt Readings from Last 1 Encounters:  11/21/17 217 lb (98.4 kg)   Last Height:   Ht Readings from Last 1 Encounters:  11/27/17 5\' 4"  (1.626 m)    Physical exam: Exam: Gen: NAD, conversant, well nourised, obese, well groomed                     CV: RRR, no MRG. No Carotid Bruits. No peripheral edema, warm, nontender Eyes: Conjunctivae clear without exudates or hemorrhage  Neuro: Detailed Neurologic Exam  Speech:    Speech is normal; fluent and spontaneous with normal comprehension.  Cognition:    The patient is oriented to person, place, and time;     recent and remote memory intact;     language fluent;     normal attention, concentration,     fund of knowledge Cranial Nerves:    The pupils are equal, round, and reactive to light. The fundi are normal and spontaneous venous pulsations are present. Visual fields are full to finger confrontation. Extraocular movements are intact. Trigeminal sensation is intact and the muscles of mastication are normal. Left lower facial droop, incomplete closure of left eye with weakness and droop, weakness of left forehead on eyebrow elevation, Bell's phenomena. Facial sensation impaired per  patient. The palate elevates in the midline. Hearing intact. Voice is  normal. Shoulder shrug is normal. The tongue has normal motion without fasciculations.   Coordination:    Normal finger to nose and heel to shin. Normal rapid alternating movements.   Gait:    Slightly wide based, large body habitus  Motor Observation:    No asymmetry, no atrophy, and no involuntary movements noted. Tone:    Normal muscle tone.    Posture:    Posture is normal. normal erect    Strength:    Strength is V/V in the upper and lower limbs.      Sensation: intact to LT     Reflex Exam:  DTR's:    Deep tendon reflexes in the upper and lower extremities are normal bilaterally.   Toes:    The toes are downgoing bilaterally.   Clonus:    Clonus is absent.      Assessment/Plan:  52 year old female with peripheral cranial nerve VII palsy likely Bell's palsy. She presented to the emergency room and was treated with steroids and Valtrex. Did not completely recover, now with synkinesis of the left face  Synkinesis: Synkinesis occurs secondary to abnormal facial nerve regeneration after Bell's palsy, or in instances where the facial nerve has been cut and sewn back together. The facial nerve fibers can implant into the different muscles in cases of Bell's palsy.  She would like to see a facial surgeon to see if any surgical options for left bells palsy for nerve transplantation or decompression  MRI brain due to unusual symptoms of facial numbness, left ptosis which is not common in [place], MD  [facility] [address] [place], [place] [address]  Phone [phone] Fax [phone]  A total of 25 minutes was spent face-to-face with this patient. Over half this time was spent on counseling patient on the  1. Facial weakness   2. Numbness and tingling of left side of face   3. Ptosis of left eyelid   4. Bell's palsy     diagnosis and  different diagnostic and therapeutic options, counseling and coordination of care, risks ans benefits of management, compliance, or risk factor reduction and education.

## 2017-11-27 NOTE — Telephone Encounter (Signed)
Cassandra Mcconnell: [id number] (exp. 11/27/17 to 12/27/17) patient is scheduled for 12/04/17 at [facility] arrival time is 3:30 pm I left a voicemail of this information and their number of [phone] if for some reason she needed to r/s.

## 2017-11-28 LAB — BASIC METABOLIC PANEL
BUN/Creatinine Ratio: 17 (ref 9–23)
BUN: 13 mg/dL (ref 6–24)
CALCIUM: 9.9 mg/dL (ref 8.7–10.2)
CHLORIDE: 106 mmol/L (ref 96–106)
CO2: 18 mmol/L — AB (ref 20–29)
Creatinine, Ser: 0.78 mg/dL (ref 0.57–1.00)
GFR calc Af Amer: 100 mL/min/{1.73_m2} (ref >59)
GFR calc non Af Amer: 87 mL/min/{1.73_m2} (ref >59)
GLUCOSE: 82 mg/dL (ref 65–99)
POTASSIUM: 4.8 mmol/L (ref 3.5–5.2)
Sodium: 145 mmol/L — ABNORMAL HIGH (ref 134–144)

## 2017-11-30 ENCOUNTER — Other Ambulatory Visit (HOSPITAL_COMMUNITY): Payer: Self-pay | Admitting: Internal Medicine

## 2017-11-30 ENCOUNTER — Ambulatory Visit (INDEPENDENT_AMBULATORY_CARE_PROVIDER_SITE_OTHER): Payer: Medicare HMO | Admitting: Orthopaedic Surgery

## 2017-11-30 DIAGNOSIS — Z1231 Encounter for screening mammogram for malignant neoplasm of breast: Secondary | ICD-10-CM

## 2017-12-04 ENCOUNTER — Ambulatory Visit (HOSPITAL_COMMUNITY)
Admission: RE | Admit: 2017-12-04 | Discharge: 2017-12-04 | Disposition: A | Discharge status: Home / Self Care | Payer: Medicare HMO | Source: Ambulatory Visit | Attending: Neurology | Admitting: Neurology

## 2017-12-04 DIAGNOSIS — H02402 Unspecified ptosis of left eyelid: Secondary | ICD-10-CM | POA: Insufficient documentation

## 2017-12-04 DIAGNOSIS — R200 Anesthesia of skin: Secondary | ICD-10-CM | POA: Diagnosis not present

## 2017-12-04 DIAGNOSIS — R29810 Facial weakness: Secondary | ICD-10-CM | POA: Insufficient documentation

## 2017-12-04 DIAGNOSIS — R202 Paresthesia of skin: Secondary | ICD-10-CM | POA: Diagnosis not present

## 2017-12-04 MED ORDER — GADOBENATE DIMEGLUMINE 529 MG/ML IV SOLN
20.0000 mL | Freq: Once | INTRAVENOUS | Status: AC | PRN
  Administered 2017-12-04: 20 mL via INTRAVENOUS

## 2017-12-05 ENCOUNTER — Ambulatory Visit (INDEPENDENT_AMBULATORY_CARE_PROVIDER_SITE_OTHER): Payer: Medicare HMO | Admitting: Orthopaedic Surgery

## 2017-12-07 ENCOUNTER — Ambulatory Visit (INDEPENDENT_AMBULATORY_CARE_PROVIDER_SITE_OTHER): Payer: Medicare HMO | Admitting: Orthopaedic Surgery

## 2017-12-07 ENCOUNTER — Ambulatory Visit (HOSPITAL_COMMUNITY): Payer: Self-pay

## 2017-12-22 ENCOUNTER — Ambulatory Visit (INDEPENDENT_AMBULATORY_CARE_PROVIDER_SITE_OTHER): Payer: Medicare HMO | Admitting: Orthopaedic Surgery

## 2018-01-02 ENCOUNTER — Ambulatory Visit: Payer: Medicare HMO | Admitting: Neurology

## 2018-01-03 ENCOUNTER — Ambulatory Visit (HOSPITAL_COMMUNITY): Payer: Self-pay

## 2018-01-17 ENCOUNTER — Ambulatory Visit (HOSPITAL_COMMUNITY): Payer: Medicare HMO

## 2018-01-29 DIAGNOSIS — I10 Essential (primary) hypertension: Secondary | ICD-10-CM | POA: Diagnosis not present

## 2018-01-29 DIAGNOSIS — Z0100 Encounter for examination of eyes and vision without abnormal findings: Secondary | ICD-10-CM | POA: Diagnosis not present

## 2018-02-10 DIAGNOSIS — R197 Diarrhea, unspecified: Secondary | ICD-10-CM | POA: Diagnosis not present

## 2018-02-10 DIAGNOSIS — Z6837 Body mass index (BMI) 37.0-37.9, adult: Secondary | ICD-10-CM | POA: Diagnosis not present

## 2018-02-10 DIAGNOSIS — J019 Acute sinusitis, unspecified: Secondary | ICD-10-CM | POA: Diagnosis not present

## 2018-02-12 DIAGNOSIS — M25562 Pain in left knee: Secondary | ICD-10-CM | POA: Diagnosis not present

## 2018-02-12 DIAGNOSIS — F339 Major depressive disorder, recurrent, unspecified: Secondary | ICD-10-CM | POA: Diagnosis not present

## 2018-02-12 DIAGNOSIS — Z6837 Body mass index (BMI) 37.0-37.9, adult: Secondary | ICD-10-CM | POA: Diagnosis not present

## 2018-04-02 ENCOUNTER — Telehealth (INDEPENDENT_AMBULATORY_CARE_PROVIDER_SITE_OTHER): Payer: Self-pay

## 2018-04-02 NOTE — Telephone Encounter (Signed)
Received a VM stating that a PA is needed for patient.  Cb# is [phone].  Reference# [id number].

## 2018-04-04 NOTE — Telephone Encounter (Signed)
Received paperwork 04/03/18.  We will hold until patient's appointment on 04/05/18

## 2018-04-05 ENCOUNTER — Other Ambulatory Visit: Payer: Self-pay | Admitting: *Deleted

## 2018-04-05 ENCOUNTER — Ambulatory Visit (INDEPENDENT_AMBULATORY_CARE_PROVIDER_SITE_OTHER): Payer: Medicare HMO | Admitting: Orthopaedic Surgery

## 2018-04-05 ENCOUNTER — Encounter (INDEPENDENT_AMBULATORY_CARE_PROVIDER_SITE_OTHER): Payer: Self-pay | Admitting: Orthopaedic Surgery

## 2018-04-05 VITALS — BP 119/94 | HR 77 | Resp 16 | Ht 64.0 in | Wt 220.0 lb

## 2018-04-05 DIAGNOSIS — M25562 Pain in left knee: Secondary | ICD-10-CM

## 2018-04-05 DIAGNOSIS — M25561 Pain in right knee: Secondary | ICD-10-CM

## 2018-04-05 DIAGNOSIS — G9519 Other vascular myelopathies: Secondary | ICD-10-CM | POA: Insufficient documentation

## 2018-04-05 DIAGNOSIS — Z6836 Body mass index (BMI) 36.0-36.9, adult: Secondary | ICD-10-CM | POA: Diagnosis not present

## 2018-04-05 DIAGNOSIS — M48062 Spinal stenosis, lumbar region with neurogenic claudication: Secondary | ICD-10-CM | POA: Diagnosis not present

## 2018-04-05 DIAGNOSIS — G8929 Other chronic pain: Secondary | ICD-10-CM

## 2018-04-05 DIAGNOSIS — G510 Bell's palsy: Secondary | ICD-10-CM | POA: Diagnosis not present

## 2018-04-05 DIAGNOSIS — M5412 Radiculopathy, cervical region: Secondary | ICD-10-CM | POA: Insufficient documentation

## 2018-04-05 MED ORDER — BUPIVACAINE HCL 0.5 % IJ SOLN
2.0000 mL | INTRAMUSCULAR | Status: AC | PRN
  Administered 2018-04-05: 2 mL via INTRA_ARTICULAR

## 2018-04-05 MED ORDER — METHYLPREDNISOLONE ACETATE 40 MG/ML IJ SUSP
80.0000 mg | INTRAMUSCULAR | Status: AC | PRN
  Administered 2018-04-05: 80 mg

## 2018-04-05 MED ORDER — LIDOCAINE HCL 1 % IJ SOLN
2.0000 mL | INTRAMUSCULAR | Status: AC | PRN
  Administered 2018-04-05: 2 mL

## 2018-04-05 NOTE — Progress Notes (Signed)
Office Visit Note   Patient: Cassandra Mcconnell           Date of Birth: 1965-11-22           MRN: [id number] Visit Date: 04/05/2018              Requested by: Celene Squibb, MD [place], [place] [id number] PCP: Celene Squibb, MD   Assessment & Plan: Visit Diagnoses:  1. Chronic pain of both knees     Plan: Multiple joint complaints including chronic issues with her lumbar spine, bilateral knees, left ankle and left foot.  Predominately discussed the issues with her knees today.  She certainly has evidence of osteoarthritis by prior films.  Caution regarding diagnosis and treatment options.  We will try cortisone injection along the medial compartment of her left knee and a course of physical therapy at any pain.  Consider MRI scan if no improvement over the next month to 6 weeks of the left knee.  I am concerned about her chronic pain involving multiple joints.  She might be a candidate for pain clinic evaluation although she is not interested in any opioid medicines.  Follow-Up Instructions: Return if symptoms worsen or fail to improve.   Orders:  Orders Placed This Encounter  Procedures  . Large Joint Inj: L knee   No orders of the defined types were placed in this encounter.     Procedures: Large Joint Inj: L knee on 04/05/2018 12:20 PM Indications: pain and diagnostic evaluation Details: 25 G 1.5 in needle, anteromedial approach  Arthrogram: No  Medications: 2 mL lidocaine 1 %; 2 mL bupivacaine 0.5 %; 80 mg methylPREDNISolone acetate 40 MG/ML Procedure, treatment alternatives, risks and benefits explained, specific risks discussed. Consent was given by the patient. Patient was prepped and draped in the usual sterile fashion.       Clinical Data: No additional findings.   Subjective: Chief Complaint  Patient presents with  . Right Knee - Pain  . Left Knee - Pain  . Left Ankle - Pain  . Knee Pain    Bil knee pain x 1 year, left worse than right,  right knee is locking, left arthroscopic 11/02/16, swelling, popping, clicking, grinding, difficulty walking, difficulty sleeping at night, falling, IBU helps some - had gastric sleeve surgery  . Ankle Pain    Left ankle pain, fell x 4, fracture 11/13/17, weakness, swelling, difficulty walking  Cassandra Mcconnell is had a long standing problem with her left knee is now experiencing some problems with her right knee, left ankle and foot.  She underwent a left knee arthroscopy by Dr. French Ana in mid 2018 and never improved.  I saw her follow-up with evidence of osteoarthritis.  She has had difficulty taking NSAIDs.  Tried some over-the-counter relief and is even tried bracing.  I had suggested Visco supplementation but the insurance company denied the request.  She has been on disability since about 2006 related to her lumbar spine.  She has had prior surgery with instrumentation.  She is also developed some issues with her right knee.  In July she fell and reinjured her left knee left ankle and left foot.  I reviewed her x-rays without any acute changes about her left knee.  There was a small avulsion chip from the medial malleolus of her left ankle that was of unknown origin time.  She also had a displaced fracture of the distal phalanx of the great toe.  That  has healed.  She is being followed by the neurosurgeons for her back and is to be evaluated for chronic Bell's palsy with mild left facial distortion.  She is tried gabapentin in the past but notes that she is "allergic to it".  She will experience some pain into her left leg with some "numbness".  She has had a prior gastric sleeve with significant weight loss but has gained about "20 pounds over the last year"  HPI  Review of Systems  Constitutional: Negative for fatigue.  HENT: Negative for trouble swallowing.   Eyes: Positive for pain.  Respiratory: Negative for shortness of breath.   Cardiovascular: Positive for leg swelling.  Gastrointestinal:  Positive for constipation.  Endocrine: Positive for cold intolerance.  Genitourinary: Negative for difficulty urinating.  Musculoskeletal: Positive for back pain, gait problem and joint swelling.  Skin: Negative for rash.  Allergic/Immunologic: Negative for food allergies.  Neurological: Positive for weakness and numbness.  Hematological: Does not bruise/bleed easily.  Psychiatric/Behavioral: Positive for sleep disturbance.     Objective: Vital Signs: BP (!) 119/94 (BP Location: Left Arm, Patient Position: Sitting, Cuff Size: Normal)   Pulse 77   Resp 16   Ht 5\' 4"  (1.626 m)   Wt 220 lb (99.8 kg)   LMP 02/10/2015   BMI 37.76 kg/m   Physical Exam Constitutional:      Appearance: She is well-developed.  Eyes:     Pupils: Pupils are equal, round, and reactive to light.  Pulmonary:     Effort: Pulmonary effort is normal.  Skin:    General: Skin is warm and dry.  Neurological:     Mental Status: She is alert and oriented to person, place, and time.  Psychiatric:        Behavior: Behavior normal.     Ortho Exam awake alert and oriented x3.  Comfortable sitting.  Straight leg raise negative.  Multiple areas of tenderness about her left knee out of proportion to what I normally would expect.  Positive patellar crepitation.  Predominately pain along the medial compartment.  No effusion.  Full extension and flexion over 100 degrees without instability no calf pain.  Has some discomfort in multiple areas about her left ankle but no skin changes or swelling  Specialty Comments:  No specialty comments available.  Imaging: No results found.   PMFS History: Patient Active Problem List   Diagnosis Date Noted  . Unilateral primary osteoarthritis, left knee 11/21/2017  . Contusion of left elbow 11/21/2017  . Left facial numbness 04/21/2017  . Allergic rhinitis 03/14/2017  . Status post left knee surgery 02/20/2017  . Left knee pain 02/20/2017  . Hypotension 09/12/2016  .  Fatigue 08/22/2016  . Postoperative nausea 04/21/2016  . Onychomycosis 10/15/2015  . Bell's palsy 09/29/2015  . Cutaneous skin tags 02/18/2015  . Complex ovarian cyst 10/15/2014  . Diverticulitis 10/12/2014  . Severe obesity (BMI >= 40) ([place]) 05/26/2014  . Depression 05/31/2013  . Morbid obesity ([place]) 12/04/2009  . 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 HYPERTROPHY, BREAST 11/20/2006  . Anxiety state 11/10/2006  . GERD 11/10/2006  . HYPERCHOLESTEROLEMIA, PURE 11/09/2006  . HYPERTENSION, BENIGN ESSENTIAL 11/09/2006  . CHOLECYSTECTOMY, HX OF 11/09/2006   Past Medical History:  Diagnosis Date  . Ankle fracture    left  . Anxiety   . Back pain   . Bell's palsy    Left side, 2009, 2017  . Depression   . Diverticulitis   . GERD (gastroesophageal reflux disease)   .  History of hiatal hernia   . Hyperlipidemia   . Hypertension   . Postoperative nausea 04/21/2016  . Toe fracture    left great toe  . Wears glasses     Family History  Problem Relation Age of Onset  . COPD Mother   . Hypertension Mother   . GER disease Mother   . Heart attack Father   . Prostate cancer Father   . Hyperlipidemia Brother   . Hypertension Brother   . Diabetes Brother   . Neuropathy Brother   . Hyperlipidemia Brother   . Hypertension Brother   . Heart attack Paternal Grandmother   . Heart attack Paternal Grandfather   . Stroke Paternal Grandfather   . Colon cancer Neg Hx   . Stomach cancer Neg Hx     Past Surgical History:  Procedure Laterality Date  . BACK SURGERY  2004   l4/5 fusion  . BREAST REDUCTION SURGERY Bilateral 08/08/2014   Procedure: BILATERAL BREAST REDUCTION  (BREAST);  Surgeon: Irene Limbo, MD;  Location: [place];  Service: Plastics;  Laterality: Bilateral;  . CHOLECYSTECTOMY     1998  . KNEE ARTHROSCOPY    . LAPAROSCOPIC BILATERAL SALPINGO OOPHERECTOMY Bilateral 03/11/2015   Procedure: LAPAROSCOPIC BILATERAL SALPINGO OOPHORECTOMY;  Surgeon:  Florian Buff, MD;  Location: [facility];  Service: Gynecology;  Laterality: Bilateral;  . LAPAROSCOPIC GASTRIC SLEEVE RESECTION WITH HIATAL HERNIA REPAIR N/A 03/22/2016   Procedure: LAPAROSCOPIC GASTRIC SLEEVE RESECTION WITH HIATAL HERNIA REPAIR WITHUPPER ENDOSCOPY;  Surgeon: Alphonsa Overall, MD;  Location[facility];  Service: General;  Laterality: N/A;  . SEPTOPLASTY  1988  . tonsillectomy  1991  . TONSILLECTOMY     Social History   Occupational History  . Occupation: stay at home mom  Tobacco Use  . Smoking status: Never Smoker  . Smokeless tobacco: Never Used  Substance and Sexual Activity  . Alcohol use: No  . Drug use: No  . Sexual activity: Yes    Partners: Male    Birth control/protection: Post-menopausal

## 2018-04-10 ENCOUNTER — Telehealth: Payer: Self-pay | Admitting: Nurse Practitioner

## 2018-04-10 NOTE — Telephone Encounter (Signed)
Cassandra Mcconnell Cassandra Mcconnell: [id number] (exp. 04/10/18 to 05/10/18) patient is scheduled at [facility] for 04/12/18

## 2018-04-12 ENCOUNTER — Ambulatory Visit (HOSPITAL_COMMUNITY): Payer: Medicare HMO

## 2018-04-12 ENCOUNTER — Other Ambulatory Visit (HOSPITAL_COMMUNITY): Payer: Self-pay | Admitting: Neurological Surgery

## 2018-04-12 DIAGNOSIS — M5412 Radiculopathy, cervical region: Secondary | ICD-10-CM

## 2018-04-12 DIAGNOSIS — G9519 Other vascular myelopathies: Secondary | ICD-10-CM

## 2018-04-12 DIAGNOSIS — M48062 Spinal stenosis, lumbar region with neurogenic claudication: Secondary | ICD-10-CM

## 2018-04-23 ENCOUNTER — Ambulatory Visit: Payer: Medicare HMO | Admitting: Nurse Practitioner

## 2018-04-24 ENCOUNTER — Other Ambulatory Visit (HOSPITAL_COMMUNITY): Payer: Self-pay | Admitting: Internal Medicine

## 2018-04-24 ENCOUNTER — Ambulatory Visit (HOSPITAL_COMMUNITY)
Admission: RE | Admit: 2018-04-24 | Discharge: 2018-04-24 | Disposition: A | Discharge status: Home / Self Care | Payer: Medicare HMO | Source: Ambulatory Visit | Attending: Neurological Surgery | Admitting: Neurological Surgery

## 2018-04-24 DIAGNOSIS — M48062 Spinal stenosis, lumbar region with neurogenic claudication: Secondary | ICD-10-CM | POA: Insufficient documentation

## 2018-04-24 DIAGNOSIS — M5412 Radiculopathy, cervical region: Secondary | ICD-10-CM | POA: Insufficient documentation

## 2018-04-24 DIAGNOSIS — G9519 Other vascular myelopathies: Secondary | ICD-10-CM

## 2018-04-24 DIAGNOSIS — Z1231 Encounter for screening mammogram for malignant neoplasm of breast: Secondary | ICD-10-CM

## 2018-04-24 DIAGNOSIS — M542 Cervicalgia: Secondary | ICD-10-CM | POA: Diagnosis not present

## 2018-04-24 DIAGNOSIS — M545 Low back pain: Secondary | ICD-10-CM | POA: Diagnosis not present

## 2018-04-27 ENCOUNTER — Other Ambulatory Visit: Payer: Self-pay

## 2018-04-27 ENCOUNTER — Encounter (HOSPITAL_COMMUNITY): Payer: Self-pay

## 2018-04-27 ENCOUNTER — Ambulatory Visit (HOSPITAL_COMMUNITY): Payer: Medicare HMO | Attending: Orthopaedic Surgery

## 2018-04-27 DIAGNOSIS — M25561 Pain in right knee: Secondary | ICD-10-CM | POA: Insufficient documentation

## 2018-04-27 DIAGNOSIS — M25562 Pain in left knee: Secondary | ICD-10-CM | POA: Diagnosis not present

## 2018-04-27 DIAGNOSIS — M6281 Muscle weakness (generalized): Secondary | ICD-10-CM | POA: Insufficient documentation

## 2018-04-27 DIAGNOSIS — R262 Difficulty in walking, not elsewhere classified: Secondary | ICD-10-CM | POA: Insufficient documentation

## 2018-04-27 DIAGNOSIS — G8929 Other chronic pain: Secondary | ICD-10-CM | POA: Diagnosis not present

## 2018-04-27 NOTE — Therapy (Signed)
[place], [place], [address] Phone: [phone]   Fax:  [phone]  Physical Therapy Evaluation  Patient Details  Name: Cassandra Mcconnell MRN: [id number] Date of Birth: 1965-04-26 Referring Provider (PT): Joni Fears, MD   Encounter Date: 04/27/2018  PT End of Session - 04/27/18 4268    Visit Number  1    Number of Visits  9    Date for PT Re-Evaluation  05/25/18    Authorization Type  [employer] H[employer]    Authorization Time Period  04/27/18 to 05/25/18    Authorization - Visit Number  1    Authorization - Number of Visits  10    PT Start Time  1118    PT Stop Time  1200    PT Time Calculation (min)  42 min    Activity Tolerance  Patient tolerated treatment well    Behavior During Therapy  [facility] for tasks assessed/performed       Past Medical History:  Diagnosis Date  . Ankle fracture    left  . Anxiety   . Back pain   . Bell's palsy    Left side, 2009, 2017  . Depression   . Diverticulitis   . GERD (gastroesophageal reflux disease)   . History of hiatal hernia   . Hyperlipidemia   . Hypertension   . Postoperative nausea 04/21/2016  . Toe fracture    left great toe  . Wears glasses     Past Surgical History:  Procedure Laterality Date  . BACK SURGERY  2004   l4/5 fusion  . BREAST REDUCTION SURGERY Bilateral 08/08/2014   Procedure: BILATERAL BREAST REDUCTION  (BREAST);  Surgeon: Irene Limbo, MD;  Location: [facility];  Service: Plastics;  Laterality: Bilateral;  . CHOLECYSTECTOMY     1998  . KNEE ARTHROSCOPY    . LAPAROSCOPIC BILATERAL SALPINGO OOPHERECTOMY Bilateral 03/11/2015   Procedure: LAPAROSCOPIC BILATERAL SALPINGO OOPHORECTOMY;  Surgeon: Florian Buff, MD;  [facility];  Service: Gynecology;  Laterality: Bilateral;  . LAPAROSCOPIC GASTRIC SLEEVE RESECTION WITH HIATAL HERNIA REPAIR N/A 03/22/2016   Procedure: LAPAROSCOPIC GASTRIC SLEEVE RESECTION WITH HIATAL  HERNIA REPAIR WITHUPPER ENDOSCOPY;  Surgeon: Alphonsa Overall, MD;  [facility];  Service: General;  Laterality: N/A;  . SEPTOPLASTY  1988  . tonsillectomy  1991  . TONSILLECTOMY      There were no vitals filed for this visit.   Subjective Assessment - 04/27/18 1120    Subjective  Pt states that she had L knee arthroscopy back in 2018 by Dr. French Ana; she states that they took 40% of her cartilage. She states that Dr. Durward Fortes told her it was arthritis and bone on bone. She states that she has had 2 cortisone shots, one before her surgery and one after and neither one have helped. She is having difficulty with standing, balance, and walking long distances. She also states that she has LBP which she had MRI done on 04/24/18 and is awaiting those results. She reports having 8-9 falls in the last 6 months due to the L knee buckling. She is also having R knee pain but nowhere near as bad as the L one. She also has h/o L ankle and great toe fracture July 2019 was placed in CAM boot for ~1 month and per pt at her f/u visit and imaging her MDs told her that it was healed.     Pertinent History  L ankle and  great toe fracture July 2019 was placed in CAM boot for ~1 month and per pt at her f/u visit and imaging her MDs told her that it was healed.     Limitations  Walking;Standing;House hold activities    How long can you sit comfortably?  as long as the L leg is propped up she is okay    How long can you stand comfortably?  1-2 mins    How long can you walk comfortably?  1-2 mins    Diagnostic tests  MRI on back; X-ray in July for L knee after fall    Patient Stated Goals  be able to walk without pain    Currently in Pain?  Yes    Pain Score  8     Pain Location  Knee    Pain Orientation  Left    Pain Descriptors / Indicators  Throbbing;Aching    Pain Type  Chronic pain    Pain Onset  More than a month ago    Pain Frequency  Constant    Aggravating Factors   any type of movement    Pain Relieving  Factors  not moving    Effect of Pain on Daily Activities  increases         [facility] PT Assessment - 04/27/18 0001      Assessment   Medical Diagnosis  chronic pain both knees    Referring Provider (PT)  Joni Fears, MD    Onset Date/Surgical Date  --   arthroscopic surgery 2018, L ankle fracture 2019   Next MD Visit  after therapy if it doesn't help    Prior Therapy  yes for LBP and neck years ago      Balance Screen   Has the patient fallen in the past 6 months  Yes    How many times?  8-9    Has the patient had a decrease in activity level because of a fear of falling?   Yes    Is the patient reluctant to leave their home because of a fear of falling?   Yes      Prior Function   Level of Independence  Independent    Vocation  On disability    Leisure  watch TV, computer      Observation/Other Assessments   Focus on Therapeutic Outcomes (FOTO)   to be completed next visit      Observation/Other Assessments-Edema    Edema  Circumferential      Circumferential Edema   Circumferential - Right  35.5cm, joint line    Circumferential - Left   38cm, joint line      ROM / Strength   AROM / PROM / Strength  AROM;Strength      AROM   AROM Assessment Site  Knee;Ankle    Right/Left Knee  Right;Left    Right Knee Extension  0    Right Knee Flexion  133    Left Knee Extension  0    Left Knee Flexion  121    Right/Left Ankle  Right;Left    Right Ankle Dorsiflexion  0    Right Ankle Plantar Flexion  60    Right Ankle Inversion  19    Right Ankle Eversion  16    Left Ankle Dorsiflexion  -4    Left Ankle Plantar Flexion  60    Left Ankle Inversion  20    Left Ankle Eversion  14      Strength  Strength Assessment Site  Hip;Knee;Ankle    Right Hip Flexion  4+/5    Right Hip Extension  3+/5    Right Hip ABduction  4+/5    Left Hip Flexion  4/5    Left Hip Extension  3-/5    Left Hip ABduction  4+/5    Right Knee Flexion  4/5    Right Knee Extension  5/5    Left Knee  Flexion  4/5    Left Knee Extension  5/5    Right Ankle Dorsiflexion  4+/5    Left Ankle Dorsiflexion  4+/5      Palpation   Patella mobility  hypomobile throughout L     Palpation comment  increased restrictions bil quads, ITB, L>R; tender to palpation throughout      Ambulation/Gait   Ambulation Distance (Feet)  502 Feet   3MWT   Assistive device  Straight cane    Gait Pattern  Step-through pattern;Decreased dorsiflexion - left;Trendelenburg;Antalgic   decreased L knee extension during midstance     Balance   Balance Assessed  Yes      Static Standing Balance   Static Standing - Balance Support  No upper extremity supported    Static Standing Balance -  Activities   Single Leg Stance - Right Leg;Single Leg Stance - Left Leg    Static Standing - Comment/# of Minutes  R:17sec L: 5 sec      Standardized Balance Assessment   Standardized Balance Assessment  Five Times Sit to Stand    Five times sit to stand comments   13.48sec, no UE, L knee pain          Objective measurements completed on examination: See above findings.         PT Education - 04/27/18 1244    Education Details  exam findings, HEP, POC    Person(s) Educated  Patient    Methods  Explanation;Demonstration;Handout    Comprehension  Verbalized understanding;Returned demonstration       PT Short Term Goals - 04/27/18 1248      PT SHORT TERM GOAL #1   Title  Pt will be independent with HEP and perform consistently in order to reduce her pain.    Time  2    Period  Weeks    Status  New    Target Date  05/11/18      PT SHORT TERM GOAL #2   Title  Pt will have reduced L knee joint line edema by 2cm or > in order to improve ROM and reduce pain.    Time  2    Period  Weeks    Status  New      PT SHORT TERM GOAL #3   Title  Pt will have improved L knee flexion AROM to 130deg or > to be symmetrical with R knee in order to reduce her pain.    Time  2    Period  Weeks    Status  New      PT SHORT  TERM GOAL #4   Title  Pt will have improved L ankle DF by 4 deg to get to neutral in order to be symmetrical with her R ankle and to maximize her L knee ROM, gait, and reduce pain.    Time  2    Period  Weeks    Status  New        PT Long Term Goals - 04/27/18 1248  PT LONG TERM GOAL #1   Title  Pt will have 1/2 grade improvement in MMT throughout all mm groups in order to reduce her pain and maximize her ability to stand and walk for longer periods of time.     Time  4    Period  Weeks    Status  New    Target Date  05/25/18      PT LONG TERM GOAL #2   Title  Pt will report being able to stand for 15 mins and walk for 15 mins to allow her to self-groom and maximize her ability to do [facility] chores with greater ease.    Time  4    Period  Weeks    Status  New      PT LONG TERM GOAL #3   Title  Pt will be able to perform L SLS for 15 sec in order to demo improved functional and core strength to maximize gait on uneven ground and stair ambulation.     Time  4    Period  Weeks    Status  New      PT LONG TERM GOAL #4   Title  Pt will have 133ft improvement in 3MWT without AD and with gait WFL to demo improved functional strength, endurance, and mobility to maximize her community access.     Time  4    Period  Weeks    Status  New             Plan - 04/27/18 1245    Clinical Impression Statement  Pt is 53 YO F who presents to [facility] with c/o bil knee pain, L worse than R. Pt has h/o L knee arthroscopic surgery in 2018 and a L ankle and L great toe fracture in July 2019. Pt currently has deficits in L knee edema, L knee ROM (mildly deficient), MMT, functional strength, soft tissue restrictions and joint mobility, as well as balance, gait, and functional mobility. Pt also has deficits in L ankle ROM and great toe extension (per gross assessment). She was noted to have reduced knee extension during midstance phase of gait on LLE, which could be due to her deficits in L ankle and  great toe mobility. Pt needs skilled PT intervention to address these impairments in order to reduce her pain and improve overall function.     Clinical Presentation  Stable    Clinical Presentation due to:  see flowsheets for objective testing    Clinical Decision Making  Low    Rehab Potential  Fair    PT Frequency  2x / week    PT Duration  4 weeks    PT Treatment/Interventions  ADLs/Self Care Home Management;Aquatic Therapy;Cryotherapy;Electrical Stimulation;Moist Heat;Ultrasound;DME Instruction;Gait training;Stair training;Functional mobility training;Therapeutic activities;Therapeutic exercise;Balance training;Neuromuscular re-education;Patient/family education;Manual techniques;Passive range of motion;Dry needling;Energy conservation;Taping;Joint Manipulations    PT Next Visit Plan  review goals; administer FOTO; address edema, ROM, strength, and balance deficits, progressing as able and tolerated    PT Home Exercise Plan  eval: supine heel slides, seated knee flex stretch, quad sets    Consulted and Agree with Plan of Care  Patient       Patient will benefit from skilled therapeutic intervention in order to improve the following deficits and impairments:  Abnormal gait, Decreased activity tolerance, Decreased balance, Decreased range of motion, Decreased strength, Difficulty walking, Hypomobility, Increased edema, Increased fascial restricitons, Increased muscle spasms, Impaired flexibility, Improper body mechanics, Postural dysfunction, Obesity, Pain  Visit Diagnosis: Chronic pain of left knee - Plan: PT plan of care cert/re-cert  Chronic pain of right knee - Plan: PT plan of care cert/re-cert  Muscle weakness (generalized) - Plan: PT plan of care cert/re-cert  Difficulty in walking, not elsewhere classified - Plan: PT plan of care cert/re-cert     Problem List Patient Active Problem List   Diagnosis Date Noted  . Unilateral primary osteoarthritis, left knee 11/21/2017  .  Contusion of left elbow 11/21/2017  . Left facial numbness 04/21/2017  . Allergic rhinitis 03/14/2017  . Status post left knee surgery 02/20/2017  . Left knee pain 02/20/2017  . Hypotension 09/12/2016  . Fatigue 08/22/2016  . Postoperative nausea 04/21/2016  . Onychomycosis 10/15/2015  . Bell's palsy 09/29/2015  . Cutaneous skin tags 02/18/2015  . Complex ovarian cyst 10/15/2014  . Diverticulitis 10/12/2014  . Severe obesity (BMI >= 40) ([place]) 05/26/2014  . Depression 05/31/2013  . Morbid obesity ([place]) 12/04/2009  . 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 HYPERTROPHY, BREAST 11/20/2006  . Anxiety state 11/10/2006  . GERD 11/10/2006  . HYPERCHOLESTEROLEMIA, PURE 11/09/2006  . HYPERTENSION, BENIGN ESSENTIAL 11/09/2006  . CHOLECYSTECTOMY, HX OF 11/09/2006        Geraldine Solar PT, DPT  [facility] [address] [place], [place], [address] Phone: [phone]   Fax:  [phone]  Name: TERESA NICODEMUS MRN: [id number] Date of Birth: 06/25/1965

## 2018-04-30 ENCOUNTER — Encounter (HOSPITAL_COMMUNITY): Payer: Self-pay

## 2018-04-30 ENCOUNTER — Ambulatory Visit (HOSPITAL_COMMUNITY): Payer: Medicare HMO

## 2018-04-30 ENCOUNTER — Ambulatory Visit (HOSPITAL_COMMUNITY)
Admission: RE | Admit: 2018-04-30 | Discharge: 2018-04-30 | Disposition: A | Discharge status: Home / Self Care | Payer: Medicare HMO | Source: Ambulatory Visit | Attending: Internal Medicine | Admitting: Internal Medicine

## 2018-04-30 DIAGNOSIS — M6281 Muscle weakness (generalized): Secondary | ICD-10-CM

## 2018-04-30 DIAGNOSIS — G8929 Other chronic pain: Secondary | ICD-10-CM

## 2018-04-30 DIAGNOSIS — M25562 Pain in left knee: Secondary | ICD-10-CM | POA: Diagnosis not present

## 2018-04-30 DIAGNOSIS — Z1231 Encounter for screening mammogram for malignant neoplasm of breast: Secondary | ICD-10-CM

## 2018-04-30 DIAGNOSIS — R262 Difficulty in walking, not elsewhere classified: Secondary | ICD-10-CM | POA: Diagnosis not present

## 2018-04-30 DIAGNOSIS — M25561 Pain in right knee: Principal | ICD-10-CM

## 2018-04-30 NOTE — Therapy (Signed)
[place], [place], [address] Phone: [phone]   Fax:  [phone]  Physical Therapy Treatment  Patient Details  Name: Cassandra Mcconnell MRN: [id number] Date of Birth: 08-30-1965 Referring Provider (PT): Joni Fears, MD   Encounter Date: 04/30/2018  PT End of Session - 04/30/18 1010    Visit Number  2    Number of Visits  9    Date for PT Re-Evaluation  05/25/18    Authorization Type  [employer] HMO    Authorization Time Period  04/27/18 to 05/25/18    Authorization - Visit Number  2    Authorization - Number of Visits  10    PT Start Time  0948    PT Stop Time  1028    PT Time Calculation (min)  40 min    Activity Tolerance  Patient tolerated treatment well;Patient limited by pain    Behavior During Therapy  [facility] for tasks assessed/performed       Past Medical History:  Diagnosis Date  . Ankle fracture    left  . Anxiety   . Back pain   . Bell's palsy    Left side, 2009, 2017  . Depression   . Diverticulitis   . GERD (gastroesophageal reflux disease)   . History of hiatal hernia   . Hyperlipidemia   . Hypertension   . Postoperative nausea 04/21/2016  . Toe fracture    left great toe  . Wears glasses     Past Surgical History:  Procedure Laterality Date  . BACK SURGERY  2004   l4/5 fusion  . BREAST REDUCTION SURGERY Bilateral 08/08/2014   Procedure: BILATERAL BREAST REDUCTION  (BREAST);  Surgeon: Irene Limbo, MD;  Location: [facility];  Service: Plastics;  Laterality: Bilateral;  . CHOLECYSTECTOMY     1998  . KNEE ARTHROSCOPY    . LAPAROSCOPIC BILATERAL SALPINGO OOPHERECTOMY Bilateral 03/11/2015   Procedure: LAPAROSCOPIC BILATERAL SALPINGO OOPHORECTOMY;  Surgeon: Florian Buff, MD;  [facility];  Service: Gynecology;  Laterality: Bilateral;  . LAPAROSCOPIC GASTRIC SLEEVE RESECTION WITH HIATAL HERNIA REPAIR N/A 03/22/2016   Procedure: LAPAROSCOPIC GASTRIC SLEEVE  RESECTION WITH HIATAL HERNIA REPAIR WITHUPPER ENDOSCOPY;  Surgeon: Alphonsa Overall, MD;  [facility];  Service: General;  Laterality: N/A;  . SEPTOPLASTY  1988  . tonsillectomy  1991  . TONSILLECTOMY      There were no vitals filed for this visit.  Subjective Assessment - 04/30/18 0949    Subjective  Pt stated she has began HEP without questions.  Did a lot of walking on Saturday, went shopping at [place] and other stores, felt really weak following walking so much.  Current pain scale 5/10 Lt knee.  Goes back to see neurosurgeon for MRI results 05/15/2018, eager to see the results.      Pertinent History  L ankle and great toe fracture July 2019 was placed in CAM boot for ~1 month and per pt at her f/u visit and imaging her MDs told her that it was healed.     Patient Stated Goals  be able to walk without pain    Currently in Pain?  Yes    Pain Score  5     Pain Location  Knee    Pain Orientation  Left    Pain Descriptors / Indicators  Sore;Aching    Pain Type  Chronic pain    Pain Onset  More than a month ago  Multiple Pain Sites  Yes    Pain Score  6    Pain Location  Back    Pain Orientation  Mid;Lower   down the middle   Pain Type  Chronic pain    Pain Radiating Towards  BLE Lt > Rt posterior and lateral down to heel    Pain Onset  More than a month ago    Pain Frequency  Constant    Aggravating Factors   standing    Pain Relieving Factors  sitting or laying down         [facility] PT Assessment - 04/30/18 0001      Assessment   Medical Diagnosis  chronic pain both knees    Referring Provider (PT)  Joni Fears, MD    Onset Date/Surgical Date  --   arthroscopic surgery 2018, Lt ankle fracture 2019   Next MD Visit  after therapy if it doesn't help    Prior Therapy  yes for LBP and neck years ago      Observation/Other Assessments   Focus on Therapeutic Outcomes (FOTO)   31.5%, limited 68.5%                   [facility] Adult PT Treatment/Exercise - 04/30/18  0001      Exercises   Exercises  Knee/Hip      Knee/Hip Exercises: Standing   Gait Training  Educated gait with SPC in Rt UE to assist (reviewed sequence)      Knee/Hip Exercises: Supine   Quad Sets  10 reps    Heel Slides  10 reps    Bridges  10 reps    Bridges Limitations  limited range 3" holds      Knee/Hip Exercises: Sidelying   Clams  10x 3" holds      Manual Therapy   Manual Therapy  Edema management    Manual therapy comments  Manual complete separate than rest of tx    Edema Management  Retro massage with elevated              PT Education - 04/30/18 1013    Education Details  Reviewed goals, FOTO complete, educated RICE techniques to assist with edema and pain    Person(s) Educated  Patient    Methods  Explanation;Demonstration    Comprehension  Verbalized understanding;Returned demonstration       PT Short Term Goals - 04/27/18 1248      PT SHORT TERM GOAL #1   Title  Pt will be independent with HEP and perform consistently in order to reduce her pain.    Time  2    Period  Weeks    Status  New    Target Date  05/11/18      PT SHORT TERM GOAL #2   Title  Pt will have reduced L knee joint line edema by 2cm or > in order to improve ROM and reduce pain.    Time  2    Period  Weeks    Status  New      PT SHORT TERM GOAL #3   Title  Pt will have improved L knee flexion AROM to 130deg or > to be symmetrical with R knee in order to reduce her pain.    Time  2    Period  Weeks    Status  New      PT SHORT TERM GOAL #4   Title  Pt will have improved L ankle  DF by 4 deg to get to neutral in order to be symmetrical with her R ankle and to maximize her L knee ROM, gait, and reduce pain.    Time  2    Period  Weeks    Status  New        PT Long Term Goals - 04/27/18 1248      PT LONG TERM GOAL #1   Title  Pt will have 1/2 grade improvement in MMT throughout all mm groups in order to reduce her pain and maximize her ability to stand and walk for  longer periods of time.     Time  4    Period  Weeks    Status  New    Target Date  05/25/18      PT LONG TERM GOAL #2   Title  Pt will report being able to stand for 15 mins and walk for 15 mins to allow her to self-groom and maximize her ability to do [facility] chores with greater ease.    Time  4    Period  Weeks    Status  New      PT LONG TERM GOAL #3   Title  Pt will be able to perform L SLS for 15 sec in order to demo improved functional and core strength to maximize gait on uneven ground and stair ambulation.     Time  4    Period  Weeks    Status  New      PT LONG TERM GOAL #4   Title  Pt will have 183ft improvement in 3MWT without AD and with gait WFL to demo improved functional strength, endurance, and mobility to maximize her community access.     Time  4    Period  Weeks    Status  New            Plan - 04/30/18 1031    Clinical Impression Statement  Reviewed goals and assured compliance with HEP.  Pt able to recall 1/3 exercises, educated benefits with compliance and purpose behind all the exercises.  Pt limited by pain this session, reviewed RICE techniques to address edema and pain with verbalized understanding.  Also educated on appropriate mechanics with SPC, encouraged pt to use SPC in Rt hand and sequence to assist during gait.  EOS with manual retrograde massasge for edema and pain control.  FOTO complete with score 31.5% with 68.5% limitation.      Rehab Potential  Fair    PT Frequency  2x / week    PT Duration  4 weeks    PT Treatment/Interventions  ADLs/Self Care Home Management;Aquatic Therapy;Cryotherapy;Electrical Stimulation;Moist Heat;Ultrasound;DME Instruction;Gait training;Stair training;Functional mobility training;Therapeutic activities;Therapeutic exercise;Balance training;Neuromuscular re-education;Patient/family education;Manual techniques;Passive range of motion;Dry needling;Energy conservation;Taping;Joint Manipulations    PT Next Visit Plan  Review  sequence with SPC gait.  Add heel/toe raises, gastroc stretches and continue to address edema, ROM, strength, and balance deficits, progressing as able and tolerated    PT Home Exercise Plan  eval: supine heel slides, seated knee flex stretch, quad sets       Patient will benefit from skilled therapeutic intervention in order to improve the following deficits and impairments:  Abnormal gait, Decreased activity tolerance, Decreased balance, Decreased range of motion, Decreased strength, Difficulty walking, Hypomobility, Increased edema, Increased fascial restricitons, Increased muscle spasms, Impaired flexibility, Improper body mechanics, Postural dysfunction, Obesity, Pain  Visit Diagnosis: Chronic pain of right knee  Chronic pain of left  knee  Muscle weakness (generalized)  Difficulty in walking, not elsewhere classified     Problem List Patient Active Problem List   Diagnosis Date Noted  . Unilateral primary osteoarthritis, left knee 11/21/2017  . Contusion of left elbow 11/21/2017  . Left facial numbness 04/21/2017  . Allergic rhinitis 03/14/2017  . Status post left knee surgery 02/20/2017  . Left knee pain 02/20/2017  . Hypotension 09/12/2016  . Fatigue 08/22/2016  . Postoperative nausea 04/21/2016  . Onychomycosis 10/15/2015  . Bell's palsy 09/29/2015  . Cutaneous skin tags 02/18/2015  . Complex ovarian cyst 10/15/2014  . Diverticulitis 10/12/2014  . Severe obesity (BMI >= 40) ([place]) 05/26/2014  . Depression 05/31/2013  . Morbid obesity ([place]) 12/04/2009  . 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 HYPERTROPHY, BREAST 11/20/2006  . Anxiety state 11/10/2006  . GERD 11/10/2006  . HYPERCHOLESTEROLEMIA, PURE 11/09/2006  . HYPERTENSION, BENIGN ESSENTIAL 11/09/2006  . CHOLECYSTECTOMY, HX OF 11/09/2006   Ihor Austin, [place]; [place]  Aldona Lento 04/30/2018, 10:39 AM  [place], [place], [address] Phone: [phone]   Fax:  [phone]  Name: JOANI COSMA MRN: [id number] Date of Birth: Dec 31, 1965

## 2018-05-02 ENCOUNTER — Ambulatory Visit (HOSPITAL_COMMUNITY): Payer: Medicare HMO

## 2018-05-02 ENCOUNTER — Telehealth (HOSPITAL_COMMUNITY): Payer: Self-pay | Admitting: Internal Medicine

## 2018-05-02 NOTE — Telephone Encounter (Signed)
05/02/18  pt cx said that she wouldnt be coming today - no reason was given

## 2018-05-03 DIAGNOSIS — R51 Headache: Secondary | ICD-10-CM | POA: Diagnosis not present

## 2018-05-03 DIAGNOSIS — G510 Bell's palsy: Secondary | ICD-10-CM | POA: Diagnosis not present

## 2018-05-07 ENCOUNTER — Ambulatory Visit (HOSPITAL_COMMUNITY): Payer: Medicare HMO

## 2018-05-07 ENCOUNTER — Telehealth (HOSPITAL_COMMUNITY): Payer: Self-pay

## 2018-05-07 ENCOUNTER — Encounter (HOSPITAL_COMMUNITY): Payer: Self-pay

## 2018-05-07 NOTE — Therapy (Signed)
[place], [place], [address] Phone: [phone]   Fax:  [phone]  Patient Details  Name: Cassandra Mcconnell MRN: [id number] Date of Birth: 1965/09/30 Referring Provider:  No ref. provider found  Encounter Date: 05/07/2018   Pt asked to be d/c due to not able to afford all of her co-pays.  PHYSICAL THERAPY DISCHARGE SUMMARY  Visits from Start of Care: 2  Current functional level related to goals / functional outcomes: See last note   Remaining deficits: See last note   Education / Equipment: n/a  Plan: Patient agrees to discharge.  Patient goals were not met. Patient is being discharged due to financial reasons.  ?????     Geraldine Solar PT, [facility] [address] [place], [place], [address] Phone: [phone]   Fax:  [phone]

## 2018-05-07 NOTE — Telephone Encounter (Signed)
No show #1; Called and spoke to pt regarding no show. Pt stating that she can't afford all of the co-pays along with all her other doctor appointments. Pt asked to be discharged at this time.   Geraldine Solar PT, DPT

## 2018-05-09 ENCOUNTER — Encounter: Payer: Self-pay | Admitting: Adult Health

## 2018-05-09 ENCOUNTER — Ambulatory Visit: Payer: Medicare HMO | Admitting: Adult Health

## 2018-05-09 ENCOUNTER — Encounter (HOSPITAL_COMMUNITY): Payer: Self-pay

## 2018-05-09 VITALS — BP 125/80 | HR 81 | Ht 64.0 in | Wt 224.5 lb

## 2018-05-09 DIAGNOSIS — R102 Pelvic and perineal pain: Secondary | ICD-10-CM

## 2018-05-09 DIAGNOSIS — Z1212 Encounter for screening for malignant neoplasm of rectum: Secondary | ICD-10-CM

## 2018-05-09 DIAGNOSIS — K5900 Constipation, unspecified: Secondary | ICD-10-CM | POA: Diagnosis not present

## 2018-05-09 DIAGNOSIS — Z1211 Encounter for screening for malignant neoplasm of colon: Secondary | ICD-10-CM | POA: Diagnosis not present

## 2018-05-09 DIAGNOSIS — N941 Unspecified dyspareunia: Secondary | ICD-10-CM

## 2018-05-09 LAB — HEMOCCULT GUIAC POC 1CARD (OFFICE): Fecal Occult Blood, POC: NEGATIVE

## 2018-05-09 MED ORDER — LINACLOTIDE 290 MCG PO CAPS: 290.0000 ug | ORAL_CAPSULE | Freq: Every day | ORAL | 0 refills | Status: DC

## 2018-05-09 NOTE — Progress Notes (Signed)
Patient ID: Cassandra Mcconnell, female   DOB: [birth year], 53 y.o.   MRN: [id number] History of Present Illness:  Cassandra Mcconnell is a 53 year old white female, married, sp BSO in 2016, in complaining of pelvic pain and pain with sex. PCP is Dr Cassandra Mcconnell.  Current Medications, Allergies, Past Medical History, Past Surgical History, Family History and Social History were reviewed in [employer] record.     Review of Systems: +pelvic pain +pain with sex for years  +constiaption, may take 3 colace a days to go    Physical Exam:BP 125/80 (BP Location: Left Arm, Patient Position: Sitting, Cuff Size: Large)   Pulse 81   Ht 5\' 4"  (1.626 m)   Wt 224 lb 8 oz (101.8 kg)   LMP 02/10/2015 Comment: had BSO 2016  BMI 38.54 kg/m  General:  Well developed, well nourished, no acute distress Skin:  Warm and dry Pelvic:  External genitalia is normal in appearance, no lesions.  The vagina is normal in appearance for age with loss of color, moisture and rugae. Urethra has no lesions or masses. The cervix is not visualized, could not open speculum due to stool in rectal vault.  Uterus is felt to be normal size, shape, and contour. +tenderness. No adnexal masses, +tenderness noted, L>R.Bladder is non tender, no masses felt. Rectal: Good sphincter tone, no polyps, or hemorrhoids felt.  Hemoccult negative.+hard lumpy stool in vault. Psych:  No mood changes, alert and cooperative,seems happy Fall risk is high, assisted off exam table. Examination chaperoned by Levy Pupa LPN.  Discussed with pt that I fell some the problem maybe the constipation.  Will give samples of Linzess and get back in for GYN [place] then several days later for pap and ROS.   Impression: 1. Pelvic pain   2. Dyspareunia in female   3. Constipation, unspecified constipation type   4. Screening for colorectal cancer       Plan: Meds ordered this encounter  Medications  . linaclotide (LINZESS) 290 MCG CAPS capsule    Sig: Take 1  capsule (290 mcg total) by mouth daily before breakfast.    Dispense:  16 capsule    Refill:  0    Order Specific Question:   Supervising Provider    Answer:   Tania Ade H [[id number]]   Return in 1 week for GYN [place] then see me 2-3 days later for pap and ROS.

## 2018-05-14 ENCOUNTER — Encounter (HOSPITAL_COMMUNITY): Payer: Self-pay

## 2018-05-14 DIAGNOSIS — Z6837 Body mass index (BMI) 37.0-37.9, adult: Secondary | ICD-10-CM | POA: Diagnosis not present

## 2018-05-14 DIAGNOSIS — E785 Hyperlipidemia, unspecified: Secondary | ICD-10-CM | POA: Diagnosis not present

## 2018-05-15 DIAGNOSIS — M5412 Radiculopathy, cervical region: Secondary | ICD-10-CM | POA: Diagnosis not present

## 2018-05-15 DIAGNOSIS — M542 Cervicalgia: Secondary | ICD-10-CM | POA: Diagnosis not present

## 2018-05-15 DIAGNOSIS — H524 Presbyopia: Secondary | ICD-10-CM | POA: Diagnosis not present

## 2018-05-15 DIAGNOSIS — H52223 Regular astigmatism, bilateral: Secondary | ICD-10-CM | POA: Diagnosis not present

## 2018-05-16 ENCOUNTER — Other Ambulatory Visit: Payer: Medicare HMO

## 2018-05-16 ENCOUNTER — Encounter (HOSPITAL_COMMUNITY): Payer: Self-pay | Admitting: Physical Therapy

## 2018-05-17 ENCOUNTER — Telehealth: Payer: Self-pay | Admitting: *Deleted

## 2018-05-17 NOTE — Telephone Encounter (Signed)
Pt called in yesterday to cancel her appointment for her ultrasound.  Pt stated that she had a bad headache.  She also told me that she had an upcoming appointment for Friday.  I offered to re-schedule her ultrasound for next week since we did not have any openings for an ultrasound for the remainder of this week.  I informed her that the ultrasound could not be performed on her visit Friday since she was having a wellness/preventative visit and the ultrasonographer's schedule.  Pt got upset and stated that we were just trying to get her money and make her pay 2 co-payments.  Pt got more upset and stated to just cancel it all and to forget it.  She wasn't doing it and hung up on me before I got a chance to respond to her.  I immediately called her back and was sent to her voice mail.  I left a detailed message stating that I was sorry that the call got disconnected before I had a chance to tell her that she would not need to pay a co-payment up front for the ultrasound appointment.  I also told her to call me back and let me know if she wanted to keep both appointments after she heard my message.  I left the appointment scheduled but never heard back from her. The appointments were all scheduled as provider instructed.  05-17-2018  AS

## 2018-05-18 ENCOUNTER — Other Ambulatory Visit: Payer: Medicare HMO | Admitting: Adult Health

## 2018-05-18 ENCOUNTER — Telehealth: Payer: Self-pay | Admitting: *Deleted

## 2018-05-18 NOTE — Telephone Encounter (Signed)
I called pt explaining the reason for doing a rectal exam. While pt was here, Anderson Malta felt the pt was constipated. She then performed a rectal exam to check for any hidden blood. Pt is upset that rectal exam was done. Pt stated "ya'll are just trying to get my money and I don't have anything else to say to you". I then tried to explain to pt that we are not just trying to get her money. We are here to take care of our patients in the best way we can. Before I could finish, pt hung up on me. [place]

## 2018-05-21 ENCOUNTER — Encounter (HOSPITAL_COMMUNITY): Payer: Self-pay

## 2018-05-21 DIAGNOSIS — K5900 Constipation, unspecified: Secondary | ICD-10-CM | POA: Diagnosis not present

## 2018-05-21 DIAGNOSIS — M1712 Unilateral primary osteoarthritis, left knee: Secondary | ICD-10-CM | POA: Diagnosis not present

## 2018-05-21 DIAGNOSIS — Z6837 Body mass index (BMI) 37.0-37.9, adult: Secondary | ICD-10-CM | POA: Diagnosis not present

## 2018-05-21 DIAGNOSIS — F411 Generalized anxiety disorder: Secondary | ICD-10-CM | POA: Diagnosis not present

## 2018-05-21 DIAGNOSIS — E782 Mixed hyperlipidemia: Secondary | ICD-10-CM | POA: Diagnosis not present

## 2018-05-21 DIAGNOSIS — G510 Bell's palsy: Secondary | ICD-10-CM | POA: Diagnosis not present

## 2018-05-21 DIAGNOSIS — R102 Pelvic and perineal pain: Secondary | ICD-10-CM | POA: Diagnosis not present

## 2018-05-21 DIAGNOSIS — K219 Gastro-esophageal reflux disease without esophagitis: Secondary | ICD-10-CM | POA: Diagnosis not present

## 2018-05-22 IMAGING — CT CT ABD-PELV W/ CM
2 of 5 series · 16 of 46 positions shown, 18 images · IV contrast (Isovue)
Comparison: CT abdomen pelvis 04/21/2016

CLINICAL DATA: Lower abdominal pain.  Diarrhea and nausea.

EXAM:
CT ABDOMEN AND PELVIS WITH CONTRAST
TECHNIQUE: Multidetector CT imaging of the abdomen and pelvis was performed
using the standard protocol following bolus administration of
intravenous contrast.
CONTRAST:  100mL JWQCS2-TSS IOPAMIDOL (JWQCS2-TSS) INJECTION 61%

[Series 2: axial st · axial · 0.81mm/px · z∈[-621,-221]mm · 13 of 91 slices shown, 15 images]
[im 6/91  soft-tissue]
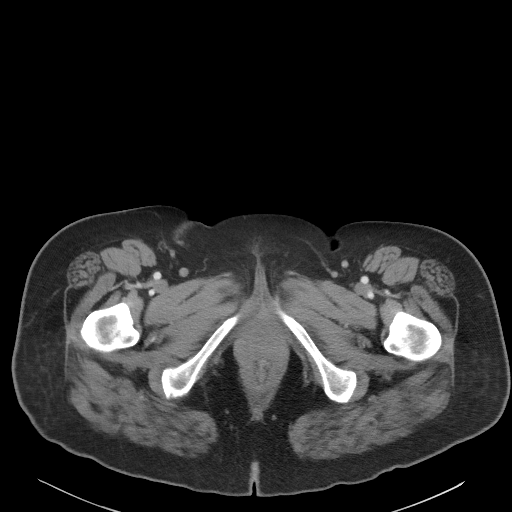
[im 6/91  bone]
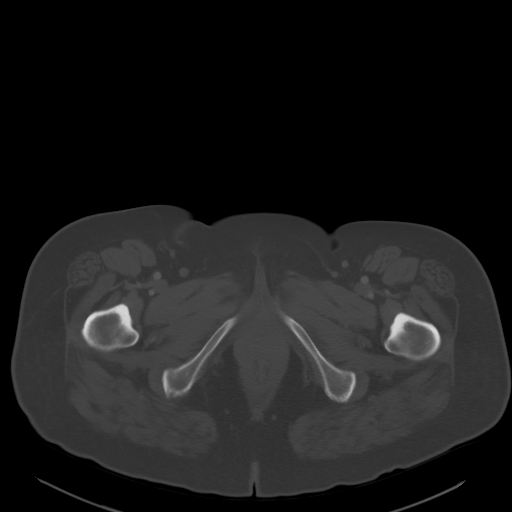
[im 11/91  soft-tissue]
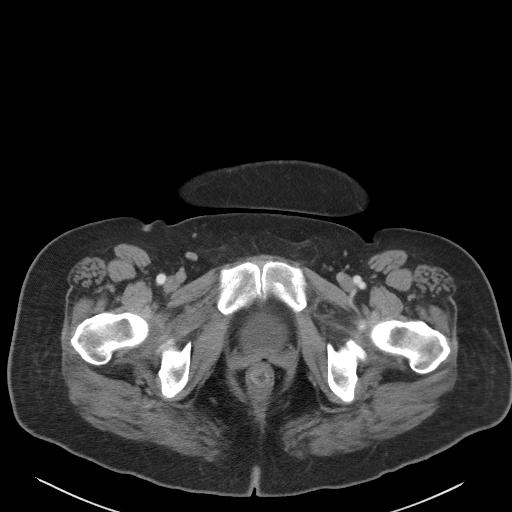
[im 21/91  soft-tissue]
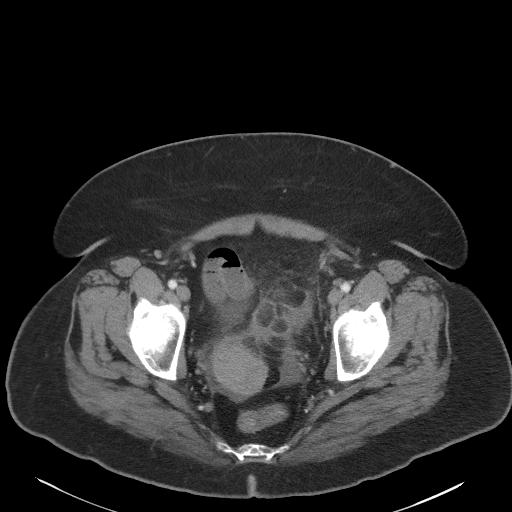
[im 26/91  soft-tissue]
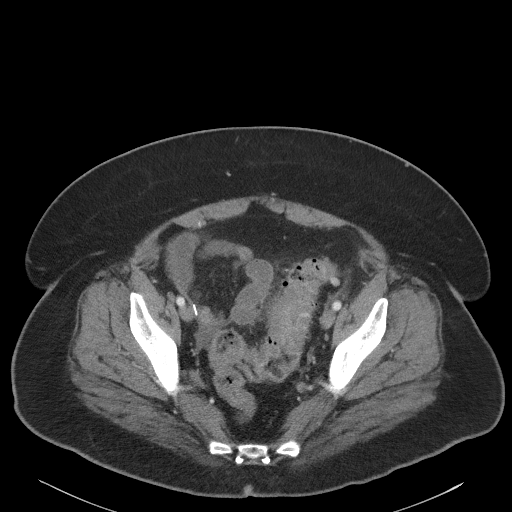
[im 31/91  soft-tissue]
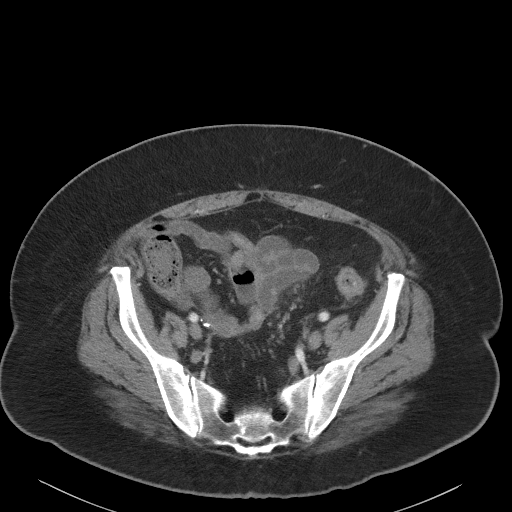
[im 41/91  soft-tissue]
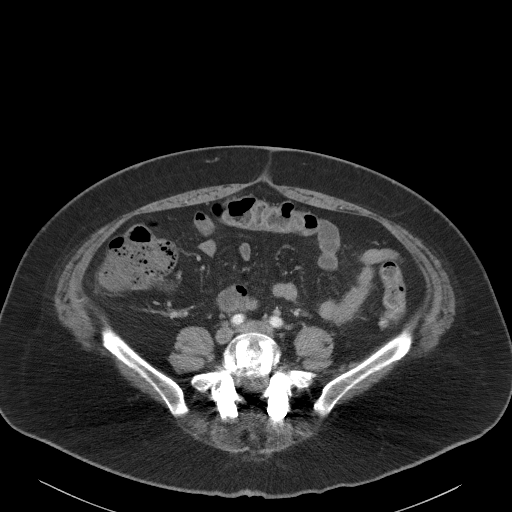
[im 46/91  soft-tissue]
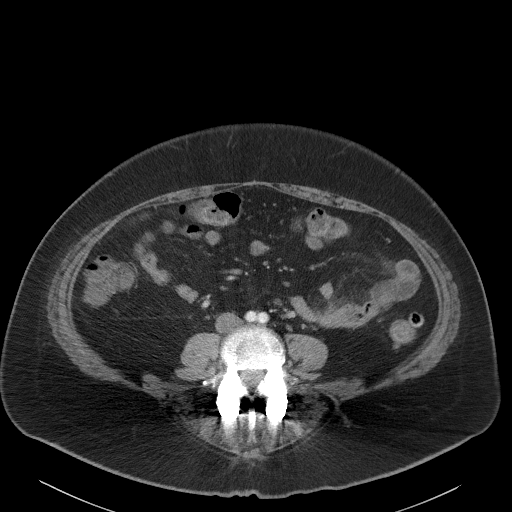
[im 51/91  soft-tissue]
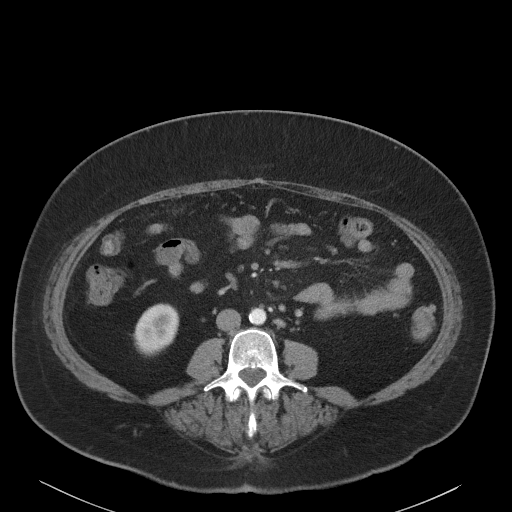
[im 61/91  soft-tissue]
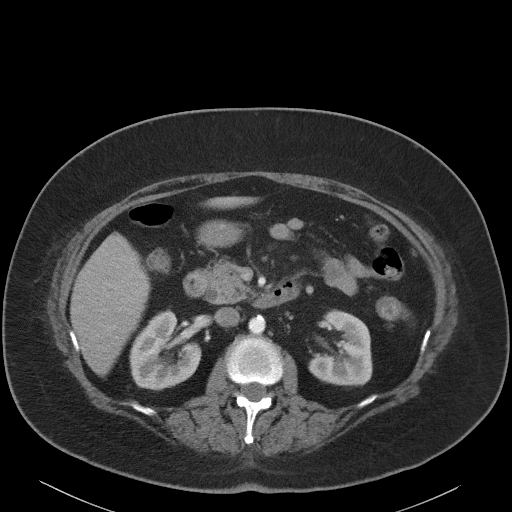
[im 61/91  bone]
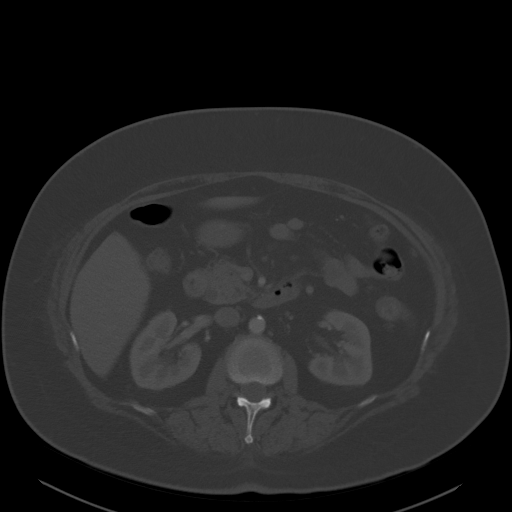
[im 66/91  soft-tissue]
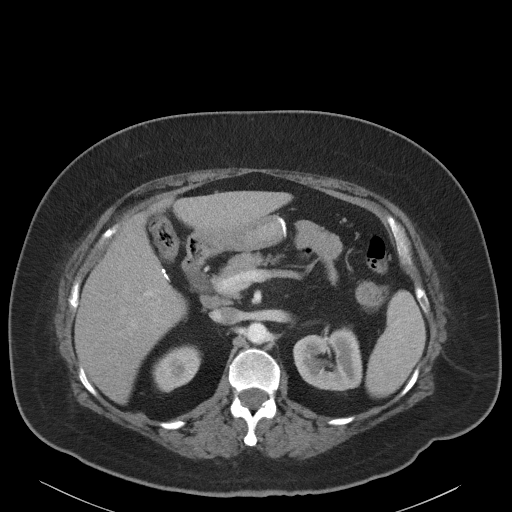
[im 71/91  soft-tissue]
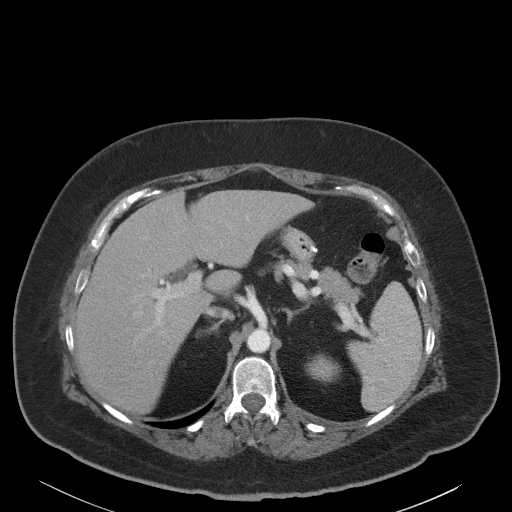
[im 81/91  soft-tissue]
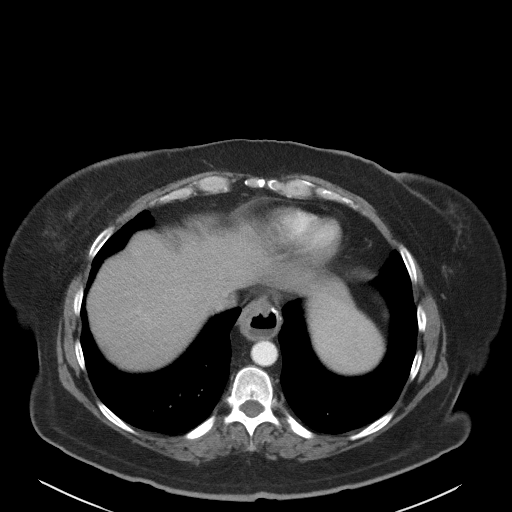
[im 86/91  soft-tissue]
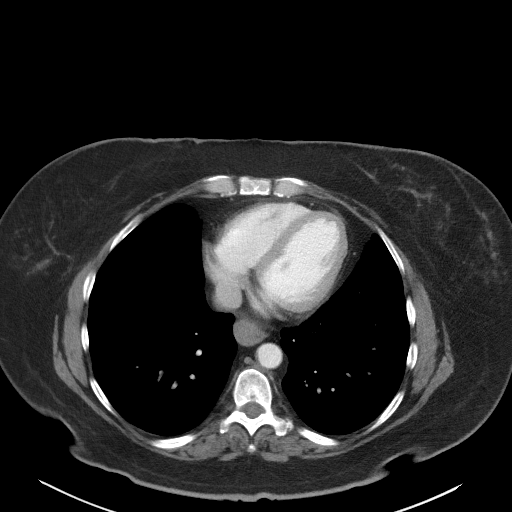

[Series 4: coronal st · coronal · 0.74mm/px · 3 of 105 slices shown]
[im 35/105  soft-tissue]
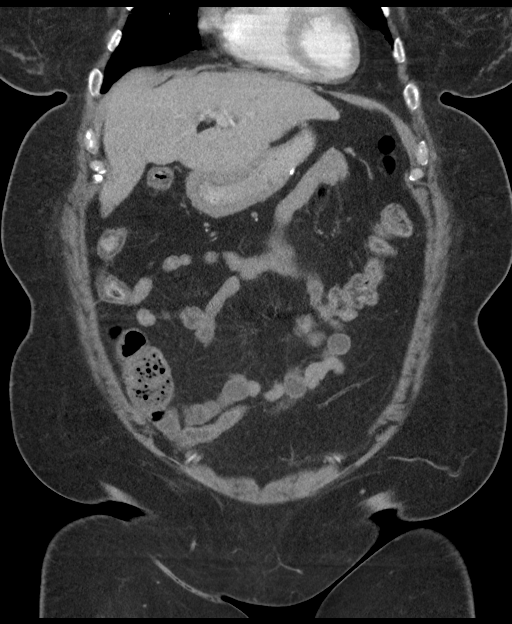
[im 47/105  soft-tissue]
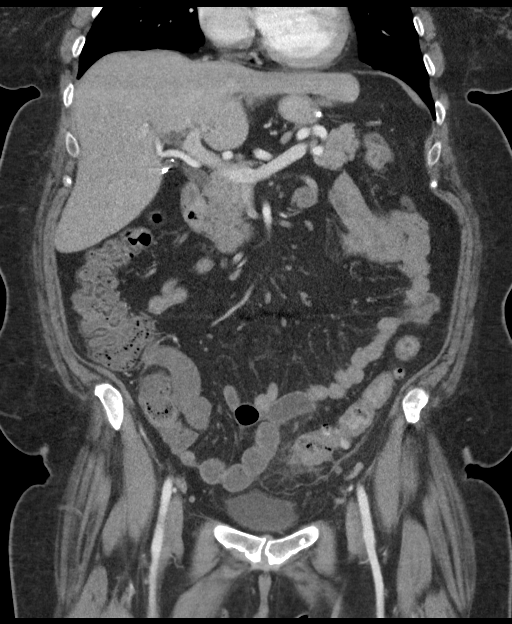
[im 58/105  soft-tissue]
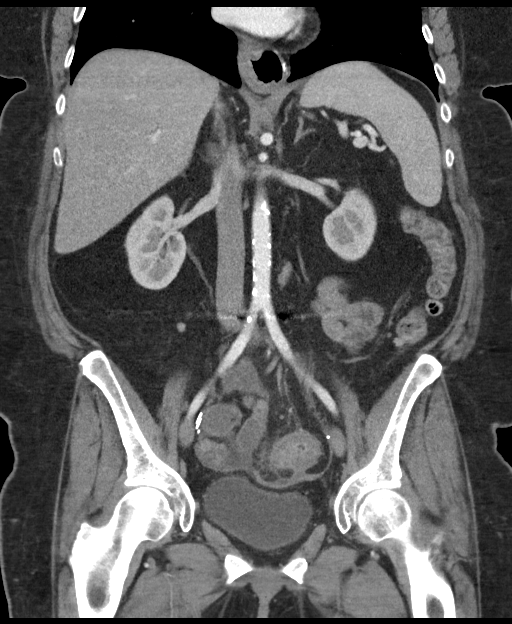

[16 of 46 positions shown; findings below may reference images not displayed]

FINDINGS: Lower chest: No pulmonary nodules. No visible pleural or pericardial
effusion.

Hepatobiliary: Normal hepatic size and contours without focal liver
lesion. No perihepatic ascites. No intra- or extrahepatic biliary
dilatation. Status post cholecystectomy.

Pancreas: Normal pancreatic contours and enhancement. No
peripancreatic fluid collection or pancreatic ductal dilatation.

Spleen: Normal.

Adrenals/Urinary Tract: Normal adrenal glands. No hydronephrosis or
solid renal mass.

Stomach/Bowel: Status post sleeve gastrectomy. Normal duodenum.
Patulous distal esophagus. There is diffuse sigmoid colon
diverticulosis with associated wall thickening and surrounding
inflammatory stranding. There is a small amount of adjacent free
fluid but no organized collection. There is no free intraperitoneal
air. Normal appendix.

Vascular/Lymphatic: There is atherosclerotic calcification of the
non aneurysmal abdominal aorta. No abdominal or pelvic adenopathy.

Reproductive: The uterus is normal.  No adnexal mass.

Musculoskeletal: L4-L5 posterior spinal fusion. Normal visualized
extrathoracic and extraperitoneal soft tissues.

Other: No contributory non-categorized findings.
IMPRESSION: 1. Acute sigmoid diverticulitis with adjacent free fluid but no
evidence of abscess formation or perforation.
2. Aortic atherosclerosis.
3. Patulous distal esophagus.

## 2018-05-23 ENCOUNTER — Encounter (HOSPITAL_COMMUNITY): Payer: Self-pay

## 2018-06-01 ENCOUNTER — Ambulatory Visit: Payer: Medicare HMO | Admitting: Neurology

## 2018-06-01 ENCOUNTER — Ambulatory Visit: Payer: Self-pay | Admitting: Neurology

## 2018-06-04 ENCOUNTER — Encounter: Payer: Self-pay | Admitting: Neurology

## 2018-06-21 ENCOUNTER — Encounter (HOSPITAL_COMMUNITY): Payer: Self-pay

## 2018-06-21 ENCOUNTER — Ambulatory Visit (HOSPITAL_COMMUNITY): Payer: Medicare HMO | Admitting: Physical Therapy

## 2018-07-06 ENCOUNTER — Encounter (HOSPITAL_COMMUNITY): Payer: Self-pay

## 2018-07-06 ENCOUNTER — Ambulatory Visit (HOSPITAL_COMMUNITY): Payer: Medicare HMO | Admitting: Physical Therapy

## 2018-07-06 ENCOUNTER — Other Ambulatory Visit: Payer: Self-pay

## 2018-07-12 DIAGNOSIS — J019 Acute sinusitis, unspecified: Secondary | ICD-10-CM | POA: Diagnosis not present

## 2018-08-14 DIAGNOSIS — F411 Generalized anxiety disorder: Secondary | ICD-10-CM | POA: Diagnosis not present

## 2018-08-14 DIAGNOSIS — F410 Panic disorder [episodic paroxysmal anxiety] without agoraphobia: Secondary | ICD-10-CM | POA: Diagnosis not present

## 2018-08-20 DIAGNOSIS — R1032 Left lower quadrant pain: Secondary | ICD-10-CM | POA: Diagnosis not present

## 2018-08-20 DIAGNOSIS — R197 Diarrhea, unspecified: Secondary | ICD-10-CM | POA: Diagnosis not present

## 2018-08-21 DIAGNOSIS — Z0000 Encounter for general adult medical examination without abnormal findings: Secondary | ICD-10-CM | POA: Diagnosis not present

## 2018-09-06 DIAGNOSIS — F410 Panic disorder [episodic paroxysmal anxiety] without agoraphobia: Secondary | ICD-10-CM | POA: Diagnosis not present

## 2018-09-06 DIAGNOSIS — F411 Generalized anxiety disorder: Secondary | ICD-10-CM | POA: Diagnosis not present

## 2018-09-19 DIAGNOSIS — G4700 Insomnia, unspecified: Secondary | ICD-10-CM | POA: Diagnosis not present

## 2018-09-19 DIAGNOSIS — F411 Generalized anxiety disorder: Secondary | ICD-10-CM | POA: Diagnosis not present

## 2018-10-18 DIAGNOSIS — Z9181 History of falling: Secondary | ICD-10-CM | POA: Diagnosis not present

## 2018-10-18 DIAGNOSIS — R262 Difficulty in walking, not elsewhere classified: Secondary | ICD-10-CM | POA: Diagnosis not present

## 2018-10-18 DIAGNOSIS — M545 Low back pain: Secondary | ICD-10-CM | POA: Diagnosis not present

## 2018-10-18 DIAGNOSIS — M25512 Pain in left shoulder: Secondary | ICD-10-CM | POA: Diagnosis not present

## 2018-10-18 DIAGNOSIS — M6281 Muscle weakness (generalized): Secondary | ICD-10-CM | POA: Diagnosis not present

## 2018-10-18 DIAGNOSIS — M5442 Lumbago with sciatica, left side: Secondary | ICD-10-CM | POA: Diagnosis not present

## 2018-10-23 ENCOUNTER — Encounter (INDEPENDENT_AMBULATORY_CARE_PROVIDER_SITE_OTHER): Payer: Self-pay | Admitting: Orthopaedic Surgery

## 2018-10-23 DIAGNOSIS — R262 Difficulty in walking, not elsewhere classified: Secondary | ICD-10-CM | POA: Diagnosis not present

## 2018-10-23 DIAGNOSIS — M5442 Lumbago with sciatica, left side: Secondary | ICD-10-CM | POA: Diagnosis not present

## 2018-10-23 DIAGNOSIS — M25512 Pain in left shoulder: Secondary | ICD-10-CM | POA: Diagnosis not present

## 2018-10-23 DIAGNOSIS — M545 Low back pain: Secondary | ICD-10-CM | POA: Diagnosis not present

## 2018-10-23 DIAGNOSIS — Z9181 History of falling: Secondary | ICD-10-CM | POA: Diagnosis not present

## 2018-10-23 DIAGNOSIS — M6281 Muscle weakness (generalized): Secondary | ICD-10-CM | POA: Diagnosis not present

## 2018-10-24 DIAGNOSIS — E782 Mixed hyperlipidemia: Secondary | ICD-10-CM | POA: Diagnosis not present

## 2018-10-24 DIAGNOSIS — E785 Hyperlipidemia, unspecified: Secondary | ICD-10-CM | POA: Diagnosis not present

## 2018-10-24 LAB — BASIC METABOLIC PANEL
BUN: 16 (ref 4–21)
Creatinine: 0.8 (ref 0.5–1.1)
Glucose: 92
Potassium: 4.8 (ref 3.4–5.3)
Sodium: 143 (ref 137–147)

## 2018-10-24 LAB — CBC AND DIFFERENTIAL
HCT: 43 (ref 36–46)
Hemoglobin: 13.8 (ref 12.0–16.0)
Platelets: 157 (ref 150–399)
WBC: 4.9

## 2018-10-24 LAB — LIPID PANEL
Cholesterol: 156 (ref 0–200)
HDL: 39 (ref 35–70)
LDL Cholesterol: 90
Triglycerides: 135 (ref 40–160)

## 2018-10-24 LAB — HEPATIC FUNCTION PANEL
ALT: 48 — AB (ref 7–35)
AST: 44 — AB (ref 13–35)
Alkaline Phosphatase: 76 (ref 25–125)
Bilirubin, Total: 0.4

## 2018-10-25 ENCOUNTER — Ambulatory Visit: Payer: Medicare HMO | Admitting: Orthopaedic Surgery

## 2018-11-20 DIAGNOSIS — M5412 Radiculopathy, cervical region: Secondary | ICD-10-CM | POA: Diagnosis not present

## 2018-11-21 ENCOUNTER — Telehealth: Payer: Self-pay

## 2018-11-21 NOTE — Telephone Encounter (Signed)
Questions for Screening COVID-19  Symptom onset: no  Travel or Contacts: no  During this illness, did/does the patient experience any of the following symptoms? Fever >100.43F []   Yes [x]   No []   Unknown Subjective fever (felt feverish) []   Yes [x]   No []   Unknown Chills []   Yes [x]   No []   Unknown Muscle aches (myalgia) []   Yes [x]   No []   Unknown Runny nose (rhinorrhea) []   Yes [x]   No []   Unknown Sore throat []   Yes [x]   No []   Unknown Cough (new onset or worsening of chronic cough) []   Yes [x]   No []   Unknown Shortness of breath (dyspnea) []   Yes [x]   No []   Unknown Nausea or vomiting []   Yes [x]   No []   Unknown Headache []   Yes [x]   No []   Unknown Abdominal pain  []   Yes [x]   No []   Unknown Diarrhea (?3 loose/looser than normal stools/24hr period) []   Yes [x]   No []   Unknown Other, specify:  Patient risk factors: Smoker? []   Current []   Former []   Never If female, currently pregnant? []   Yes []   No  Patient Active Problem List   Diagnosis Date Noted  . Constipation 05/09/2018  . Dyspareunia in female 05/09/2018  . Pelvic pain 05/09/2018  . Unilateral primary osteoarthritis, left knee 11/21/2017  . Contusion of left elbow 11/21/2017  . Left facial numbness 04/21/2017  . Allergic rhinitis 03/14/2017  . Status post left knee surgery 02/20/2017  . Left knee pain 02/20/2017  . Hypotension 09/12/2016  . Fatigue 08/22/2016  . Postoperative nausea 04/21/2016  . Onychomycosis 10/15/2015  . Bell's palsy 09/29/2015  . Cutaneous skin tags 02/18/2015  . Complex ovarian cyst 10/15/2014  . Diverticulitis 10/12/2014  . Severe obesity (BMI >= 40) ([place]) 05/26/2014  . Depression 05/31/2013  . Morbid obesity ([place]) 12/04/2009  . 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 HYPERTROPHY, BREAST 11/20/2006  . Anxiety state 11/10/2006  . GERD 11/10/2006  . HYPERCHOLESTEROLEMIA, PURE 11/09/2006  . HYPERTENSION, BENIGN ESSENTIAL 11/09/2006  . CHOLECYSTECTOMY, HX OF 11/09/2006     Plan:  []   High risk for COVID-19 with red flags go to ED (with CP, SOB, weak/lightheaded, or fever > 101.5). Call ahead.  []   High risk for COVID-19 but stable. Inform provider and coordinate time for [facility] visit.   []   No red flags but URI signs or symptoms okay for [facility] visit.

## 2018-11-22 ENCOUNTER — Encounter: Payer: Self-pay | Admitting: Family Medicine

## 2018-11-22 ENCOUNTER — Other Ambulatory Visit: Payer: Self-pay

## 2018-11-22 ENCOUNTER — Ambulatory Visit (INDEPENDENT_AMBULATORY_CARE_PROVIDER_SITE_OTHER): Payer: Medicare HMO | Admitting: Family Medicine

## 2018-11-22 VITALS — BP 120/80 | HR 80 | Temp 97.6°F | Ht 64.0 in | Wt 219.6 lb

## 2018-11-22 DIAGNOSIS — F411 Generalized anxiety disorder: Secondary | ICD-10-CM

## 2018-11-22 MED ORDER — CITALOPRAM HYDROBROMIDE 20 MG PO TABS: 20.0000 mg | ORAL_TABLET | Freq: Two times a day (BID) | ORAL | 3 refills | Status: DC
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
## 2018-11-23 ENCOUNTER — Encounter: Payer: Self-pay | Admitting: Orthopaedic Surgery

## 2018-11-23 ENCOUNTER — Ambulatory Visit: Payer: Medicare HMO | Admitting: Orthopaedic Surgery

## 2018-11-23 ENCOUNTER — Telehealth: Payer: Self-pay | Admitting: Orthopaedic Surgery

## 2018-11-23 VITALS — BP 135/80 | HR 77 | Ht 64.0 in | Wt 219.0 lb

## 2018-11-23 DIAGNOSIS — M1712 Unilateral primary osteoarthritis, left knee: Secondary | ICD-10-CM

## 2018-11-23 NOTE — Telephone Encounter (Signed)
Can you please precert for Left visco injections? This is Dr.Whitfield's patient. Thanks!!

## 2018-11-23 NOTE — Progress Notes (Signed)
Office Visit Note   Patient: Cassandra Mcconnell           Date of Birth: 1965/05/25           MRN: [id number] Visit Date: 11/23/2018              Requested by: Celene Squibb, MD [place],  [place] [id number] PCP: Ronnald Nian, DO   Assessment & Plan: Visit Diagnoses:  1. Unilateral primary osteoarthritis, left knee     Plan: Osteoarthritis left knee predominate in the medial and patellofemoral compartments.  Pain has been recalcitrant to numerous treatments including exercises and bracing.  Has had prior gastric bypass and is unable to take NSAIDs.  2 prior cortisone injections were not helpful.  Long discussion regarding her knee exercises.  Will precertified Visco supplementation.  At some point she may be a candidate for knee replacement but I think she is really deconditioned.  Would like to repeat films of her knee as it is been a year when she returns  Follow-Up Instructions: Return Pre-CERT Visco supplementation.   Orders:  No orders of the defined types were placed in this encounter.  No orders of the defined types were placed in this encounter.     Procedures: No procedures performed   Clinical Data: No additional findings.   Subjective: Chief Complaint  Patient presents with  . Left Knee - Follow-up, Pain  Patient presents today for recurrent left knee pain. She was last here for her knee in December of 2019 and received a cortisone injection. Patient states that she has also tried therapy. She has not received relief from therapy or the cortisone injection. The whole knee is painful constantly and swells. She wears a spider brace, which helps some. She does not take anything for pain. Long history of left knee pain dating back several years ordered by another orthopedist in 2017.  Full-thickness cartilage loss involving the lateral patella facet high-grade areas of cartilage loss in the medial compartment.  The lateral compartment was relatively  clear.  In addition she had an oblique tear of the posterior horn of the medial meniscus.  She underwent arthroscopy for partial medial meniscectomy by that same orthopedist and has had trouble ever since.  I seen her on several occasions for cortisone injections with Aleve any relief.  She has been wearing a spider brace.  Has multiple other issues including neck pain.  She has upcoming surgery for fusion at the end of August.  She has difficulty walking.  Films of her knee year ago demonstrated decrease in the medial joint space with slight varus.  HPI  Review of Systems   Objective: Vital Signs: BP 135/80   Pulse 77   Ht 5\' 4"  (1.626 m)   Wt 219 lb (99.3 kg)   BMI 37.59 kg/m   Physical Exam Constitutional:      Appearance: She is well-developed.  Eyes:     Pupils: Pupils are equal, round, and reactive to light.  Pulmonary:     Effort: Pulmonary effort is normal.  Skin:    General: Skin is warm and dry.  Neurological:     Mental Status: She is alert and oriented to person, place, and time.  Psychiatric:        Behavior: Behavior normal.     Ortho Exam left knee is in the spider brace.  I thought she lacked just a few degrees to full extension but flexed over 100  degrees without instability.  Predominately medial joint pain.  Some pain about the patella with flexion extension.  No calf pain.  No distal edema.  Specialty Comments:  No specialty comments available.  Imaging: No results found.   PMFS History: Patient Active Problem List   Diagnosis Date Noted  . Constipation 05/09/2018  . Dyspareunia in female 05/09/2018  . Pelvic pain 05/09/2018  . Unilateral primary osteoarthritis, left knee 11/21/2017  . Contusion of left elbow 11/21/2017  . Left facial numbness 04/21/2017  . Allergic rhinitis 03/14/2017  . Status post left knee surgery 02/20/2017  . Left knee pain 02/20/2017  . Hypotension 09/12/2016  . Fatigue 08/22/2016  . Postoperative nausea 04/21/2016  .  Onychomycosis 10/15/2015  . Bell's palsy 09/29/2015  . Cutaneous skin tags 02/18/2015  . Complex ovarian cyst 10/15/2014  . Diverticulitis 10/12/2014  . Severe obesity (BMI >= 40) ([place]) 05/26/2014  . Depression 05/31/2013  . Morbid obesity ([place]) 12/04/2009  . 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 HYPERTROPHY, BREAST 11/20/2006  . Anxiety state 11/10/2006  . GERD 11/10/2006  . HYPERCHOLESTEROLEMIA, PURE 11/09/2006  . HYPERTENSION, BENIGN ESSENTIAL 11/09/2006  . CHOLECYSTECTOMY, HX OF 11/09/2006   Past Medical History:  Diagnosis Date  . Ankle fracture    left  . Anxiety   . Back pain   . Bell's palsy    Left side, 2009, 2017  . Bell's palsy   . Depression   . Diverticulitis   . GERD (gastroesophageal reflux disease)   . History of hiatal hernia   . Hyperlipidemia   . Hypertension   . Postoperative nausea 04/21/2016  . Toe fracture    left great toe  . Wears glasses     Family History  Problem Relation Age of Onset  . COPD Mother   . Hypertension Mother   . GER disease Mother   . Heart attack Father   . Prostate cancer Father   . Hyperlipidemia Brother   . Hypertension Brother   . Diabetes Brother   . Neuropathy Brother   . Hyperlipidemia Brother   . Hypertension Brother   . Heart attack Paternal Grandmother   . Heart attack Paternal Grandfather   . Stroke Paternal Grandfather   . Cancer Paternal Uncle        prostate and bone  . Cancer Paternal Uncle        prostate and bone  . Colon cancer Neg Hx   . Stomach cancer Neg Hx     Past Surgical History:  Procedure Laterality Date  . BACK SURGERY  2004   l4/5 fusion  . BREAST REDUCTION SURGERY Bilateral 08/08/2014   Procedure: BILATERAL BREAST REDUCTION  (BREAST);  Surgeon: Irene Limbo, MD;  Location: [facility];  Service: Plastics;  Laterality: Bilateral;  . CHOLECYSTECTOMY     1998  . KNEE ARTHROSCOPY    . LAPAROSCOPIC BILATERAL SALPINGO OOPHERECTOMY Bilateral 03/11/2015    Procedure: LAPAROSCOPIC BILATERAL SALPINGO OOPHORECTOMY;  Surgeon: Florian Buff, MD;  Location: [facility];  Service: Gynecology;  Laterality: Bilateral;  . LAPAROSCOPIC GASTRIC SLEEVE RESECTION WITH HIATAL HERNIA REPAIR N/A 03/22/2016   Procedure: LAPAROSCOPIC GASTRIC SLEEVE RESECTION WITH HIATAL HERNIA REPAIR WITHUPPER ENDOSCOPY;  Surgeon: Alphonsa Overall, MD;  Location[facility];  Service: General;  Laterality: N/A;  . REDUCTION MAMMAPLASTY Bilateral   . SEPTOPLASTY  1988  . tonsillectomy  1991  . TONSILLECTOMY     Social History   Occupational History  . Occupation: stay at home mom  Tobacco Use  . Smoking status: Never Smoker  . Smokeless tobacco: Never Used  Substance and Sexual Activity  . Alcohol use: No  . Drug use: No  . Sexual activity: Not Currently    Partners: Male    Birth control/protection: Post-menopausal, Surgical    Comment: BSO 2016

## 2018-11-27 ENCOUNTER — Encounter: Payer: Self-pay | Admitting: Family Medicine

## 2018-11-27 NOTE — Telephone Encounter (Signed)
Noted  

## 2018-11-30 ENCOUNTER — Telehealth: Payer: Self-pay

## 2018-11-30 NOTE — Telephone Encounter (Signed)
Submitted VOB for Orthovisc, left knee.

## 2018-12-03 ENCOUNTER — Encounter: Payer: Self-pay | Admitting: Family Medicine

## 2018-12-03 ENCOUNTER — Telehealth: Payer: Self-pay

## 2018-12-03 ENCOUNTER — Ambulatory Visit: Payer: Medicare HMO | Admitting: Neurology

## 2018-12-03 NOTE — Telephone Encounter (Signed)
PA required for Orthovisc, left knee. Talked with [facility] insurance to initiate PA and per Brandy Hale., no PA is required for ([id number]) Orthovisc, left knee. Reference# [id number]

## 2018-12-03 NOTE — Telephone Encounter (Signed)
Please schedule patient an appointment with Dr. Durward Fortes for gel injection.  Thank you.  Approved for Orthovisc series, left knee. [place] Patient will be responsible for 20% OOP. May have a Co-pay of $45.00 No PA required per [facility] Reference# [phone]

## 2018-12-04 ENCOUNTER — Ambulatory Visit: Payer: Medicare HMO | Admitting: Neurology

## 2018-12-06 DIAGNOSIS — Z1159 Encounter for screening for other viral diseases: Secondary | ICD-10-CM | POA: Diagnosis not present

## 2018-12-06 DIAGNOSIS — M4802 Spinal stenosis, cervical region: Secondary | ICD-10-CM | POA: Diagnosis not present

## 2018-12-10 ENCOUNTER — Encounter: Payer: Self-pay | Admitting: Orthopaedic Surgery

## 2018-12-11 ENCOUNTER — Ambulatory Visit: Payer: Medicare HMO | Admitting: Orthopaedic Surgery

## 2018-12-12 DIAGNOSIS — M4722 Other spondylosis with radiculopathy, cervical region: Secondary | ICD-10-CM | POA: Diagnosis not present

## 2018-12-12 DIAGNOSIS — M4802 Spinal stenosis, cervical region: Secondary | ICD-10-CM | POA: Diagnosis not present

## 2018-12-12 DIAGNOSIS — M2578 Osteophyte, vertebrae: Secondary | ICD-10-CM | POA: Diagnosis not present

## 2018-12-12 DIAGNOSIS — M5412 Radiculopathy, cervical region: Secondary | ICD-10-CM | POA: Diagnosis not present

## 2018-12-12 HISTORY — PX: CERVICAL DISCECTOMY: SHX98

## 2018-12-14 ENCOUNTER — Encounter: Payer: Self-pay | Admitting: Family Medicine

## 2018-12-18 ENCOUNTER — Other Ambulatory Visit: Payer: Self-pay

## 2018-12-18 ENCOUNTER — Encounter: Payer: Self-pay | Admitting: Orthopaedic Surgery

## 2018-12-18 ENCOUNTER — Encounter: Payer: Self-pay | Admitting: Family Medicine

## 2018-12-18 ENCOUNTER — Ambulatory Visit (INDEPENDENT_AMBULATORY_CARE_PROVIDER_SITE_OTHER): Payer: Medicare HMO | Admitting: Orthopaedic Surgery

## 2018-12-18 DIAGNOSIS — M1712 Unilateral primary osteoarthritis, left knee: Secondary | ICD-10-CM

## 2018-12-18 MED ORDER — HYALURONAN 30 MG/2ML IX SOSY
30.0000 mg | PREFILLED_SYRINGE | INTRA_ARTICULAR | Status: AC | PRN
  Administered 2018-12-18: 14:00:00 30 mg via INTRA_ARTICULAR

## 2018-12-18 NOTE — Progress Notes (Signed)
Office Visit Note   Patient: Cassandra Mcconnell           Date of Birth: 1966-02-16           MRN: [id number] Visit Date: 12/18/2018              Requested by: Ronnald Nian, DO [place],  [place] [address] PCP: Ronnald Nian, DO   Assessment & Plan: Visit Diagnoses:  1. Unilateral primary osteoarthritis, left knee     Plan: First Orthovisc injection left knee.  Return weekly for the next 2 weeks to complete the series  Follow-Up Instructions: Return in about 1 week (around 12/25/2018).   Orders:  No orders of the defined types were placed in this encounter.  No orders of the defined types were placed in this encounter.     Procedures: Large Joint Inj: L knee on 12/18/2018 1:58 PM Indications: pain and joint swelling Details: 25 G 1.5 in needle, anteromedial approach  Arthrogram: No  Medications: 30 mg Hyaluronan 30 MG/2ML Outcome: tolerated well, no immediate complications Procedure, treatment alternatives, risks and benefits explained, specific risks discussed. Consent was given by the patient. Immediately prior to procedure a time out was called to verify the correct patient, procedure, equipment, support staff and site/side marked as required. Patient was prepped and draped in the usual sterile fashion.       Clinical Data: No additional findings.   Subjective: No chief complaint on file. Has been approved for Orthovisc injections in her left knee.  Will start the series today  HPI  Review of Systems   Objective: Vital Signs: There were no vitals taken for this visit.  Physical Exam  Ortho Exam left knee was not hot red warm or swollen.  Did have some mild medial lateral joint pain.  Some patellar crepitation  Specialty Comments:  No specialty comments available.  Imaging: No results found.   PMFS History: Patient Active Problem List   Diagnosis Date Noted  . Constipation 05/09/2018  . Dyspareunia in female  05/09/2018  . Pelvic pain 05/09/2018  . Unilateral primary osteoarthritis, left knee 11/21/2017  . Contusion of left elbow 11/21/2017  . Left facial numbness 04/21/2017  . Allergic rhinitis 03/14/2017  . Status post left knee surgery 02/20/2017  . Left knee pain 02/20/2017  . Hypotension 09/12/2016  . Fatigue 08/22/2016  . Postoperative nausea 04/21/2016  . Onychomycosis 10/15/2015  . Bell's palsy 09/29/2015  . Cutaneous skin tags 02/18/2015  . Complex ovarian cyst 10/15/2014  . Diverticulitis 10/12/2014  . Severe obesity (BMI >= 40) ([place]) 05/26/2014  . Depression 05/31/2013  . Morbid obesity ([place]) 12/04/2009  . 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 HYPERTROPHY, BREAST 11/20/2006  . Anxiety state 11/10/2006  . GERD 11/10/2006  . HYPERCHOLESTEROLEMIA, PURE 11/09/2006  . HYPERTENSION, BENIGN ESSENTIAL 11/09/2006  . CHOLECYSTECTOMY, HX OF 11/09/2006   Past Medical History:  Diagnosis Date  . Ankle fracture    left  . Anxiety   . Back pain   . Bell's palsy    Left side, 2009, 2017  . Bell's palsy   . Depression   . Diverticulitis   . GERD (gastroesophageal reflux disease)   . History of hiatal hernia   . Hyperlipidemia   . Hypertension   . Postoperative nausea 04/21/2016  . Toe fracture    left great toe  . Wears glasses     Family History  Problem Relation Age of Onset  . COPD  Mother   . Hypertension Mother   . GER disease Mother   . Heart attack Father   . Prostate cancer Father   . Hyperlipidemia Brother   . Hypertension Brother   . Diabetes Brother   . Neuropathy Brother   . Hyperlipidemia Brother   . Hypertension Brother   . Heart attack Paternal Grandmother   . Heart attack Paternal Grandfather   . Stroke Paternal Grandfather   . Cancer Paternal Uncle        prostate and bone  . Cancer Paternal Uncle        prostate and bone  . Colon cancer Neg Hx   . Stomach cancer Neg Hx     Past Surgical History:  Procedure Laterality Date  . BACK  SURGERY  2004   l4/5 fusion  . BREAST REDUCTION SURGERY Bilateral 08/08/2014   Procedure: BILATERAL BREAST REDUCTION  (BREAST);  Surgeon: Irene Limbo, MD;  Location: [place];  Service: Plastics;  Laterality: Bilateral;  . CHOLECYSTECTOMY     1998  . KNEE ARTHROSCOPY    . LAPAROSCOPIC BILATERAL SALPINGO OOPHERECTOMY Bilateral 03/11/2015   Procedure: LAPAROSCOPIC BILATERAL SALPINGO OOPHORECTOMY;  Surgeon: Florian Buff, MD;  Location: [facility];  Service: Gynecology;  Laterality: Bilateral;  . LAPAROSCOPIC GASTRIC SLEEVE RESECTION WITH HIATAL HERNIA REPAIR N/A 03/22/2016   Procedure: LAPAROSCOPIC GASTRIC SLEEVE RESECTION WITH HIATAL HERNIA REPAIR WITHUPPER ENDOSCOPY;  Surgeon: Alphonsa Overall, MD;  Location: [facility];  Service: General;  Laterality: N/A;  . REDUCTION MAMMAPLASTY Bilateral   . SEPTOPLASTY  1988  . tonsillectomy  1991  . TONSILLECTOMY     Social History   Occupational History  . Occupation: stay at home mom  Tobacco Use  . Smoking status: Never Smoker  . Smokeless tobacco: Never Used  Substance and Sexual Activity  . Alcohol use: No  . Drug use: No  . Sexual activity: Not Currently    Partners: Male    Birth control/protection: Post-menopausal, Surgical    Comment: BSO 2016     Garald Balding, MD   Note - This record has been created using [employer].  Chart creation errors have been sought, but may not always  have been located. Such creation errors do not reflect on  the standard of medical care.

## 2018-12-19 MED ORDER — ESCITALOPRAM OXALATE 5 MG PO TABS: 5.0000 mg | ORAL_TABLET | Freq: Every day | ORAL | 3 refills | Status: DC

## 2018-12-24 ENCOUNTER — Encounter: Payer: Self-pay | Admitting: Family Medicine

## 2018-12-25 ENCOUNTER — Encounter: Payer: Self-pay | Admitting: Orthopaedic Surgery

## 2018-12-25 ENCOUNTER — Other Ambulatory Visit: Payer: Self-pay

## 2018-12-25 ENCOUNTER — Ambulatory Visit (INDEPENDENT_AMBULATORY_CARE_PROVIDER_SITE_OTHER): Payer: Medicare HMO | Admitting: Orthopaedic Surgery

## 2018-12-25 VITALS — BP 147/88 | HR 83 | Ht 64.0 in | Wt 219.0 lb

## 2018-12-25 DIAGNOSIS — M1712 Unilateral primary osteoarthritis, left knee: Secondary | ICD-10-CM | POA: Diagnosis not present

## 2018-12-25 MED ORDER — HYALURONAN 30 MG/2ML IX SOSY
30.0000 mg | PREFILLED_SYRINGE | INTRA_ARTICULAR | Status: AC | PRN
  Administered 2018-12-25: 30 mg via INTRA_ARTICULAR

## 2018-12-25 NOTE — Progress Notes (Signed)
Office Visit Note   Patient: Cassandra Mcconnell           Date of Birth: [birth year]           MRN: [id number] Visit Date: 12/25/2018              Requested by: Ronnald Nian, DO [place],  [place] [address] PCP: Ronnald Nian, DO   Assessment & Plan: Visit Diagnoses:  1. Unilateral primary osteoarthritis, left knee     Plan: Second Orthovisc injection left knee.  Good result with the first  Follow-Up Instructions: Return in about 1 week (around 01/01/2019).   Orders:  Orders Placed This Encounter  Procedures  . Large Joint Inj: L knee   No orders of the defined types were placed in this encounter.     Procedures: Large Joint Inj: L knee on 12/25/2018 3:20 PM Indications: pain and joint swelling Details: 25 G 1.5 in needle, anteromedial approach  Arthrogram: No  Medications: 30 mg Hyaluronan 30 MG/2ML Outcome: tolerated well, no immediate complications Procedure, treatment alternatives, risks and benefits explained, specific risks discussed. Consent was given by the patient. Immediately prior to procedure a time out was called to verify the correct patient, procedure, equipment, support staff and site/side marked as required. Patient was prepped and draped in the usual sterile fashion.       Clinical Data: No additional findings.   Subjective: Chief Complaint  Patient presents with  . Left Knee - Follow-up    Orthovisc started 12/18/2018  Patient presents today for the second Orthovisc injection in her left knee. She started the injections on 12/18/2018. Patient states that she has already noticed improvement.   HPI  Review of Systems   Objective: Vital Signs: BP (!) 147/88   Pulse 83   Ht 5\' 4"  (1.626 m)   Wt 219 lb (99.3 kg)   BMI 37.59 kg/m   Physical Exam  Ortho Exam left knee was not hot red warm or swollen.  We will proceed with the second Orthovisc injection Specialty Comments:  No specialty comments available.   Imaging: No results found.   PMFS History: Patient Active Problem List   Diagnosis Date Noted  . Constipation 05/09/2018  . Dyspareunia in female 05/09/2018  . Pelvic pain 05/09/2018  . Unilateral primary osteoarthritis, left knee 11/21/2017  . Contusion of left elbow 11/21/2017  . Left facial numbness 04/21/2017  . Allergic rhinitis 03/14/2017  . Status post left knee surgery 02/20/2017  . Left knee pain 02/20/2017  . Hypotension 09/12/2016  . Fatigue 08/22/2016  . Postoperative nausea 04/21/2016  . Onychomycosis 10/15/2015  . Bell's palsy 09/29/2015  . Cutaneous skin tags 02/18/2015  . Complex ovarian cyst 10/15/2014  . Diverticulitis 10/12/2014  . Severe obesity (BMI >= 40) ([place]) 05/26/2014  . Depression 05/31/2013  . Morbid obesity ([place]) 12/04/2009  . 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 HYPERTROPHY, BREAST 11/20/2006  . Anxiety state 11/10/2006  . GERD 11/10/2006  . HYPERCHOLESTEROLEMIA, PURE 11/09/2006  . HYPERTENSION, BENIGN ESSENTIAL 11/09/2006  . CHOLECYSTECTOMY, HX OF 11/09/2006   Past Medical History:  Diagnosis Date  . Ankle fracture    left  . Anxiety   . Back pain   . Bell's palsy    Left side, 2009, 2017  . Bell's palsy   . Depression   . Diverticulitis   . GERD (gastroesophageal reflux disease)   . History of hiatal hernia   . Hyperlipidemia   .  Hypertension   . Postoperative nausea 04/21/2016  . Toe fracture    left great toe  . Wears glasses     Family History  Problem Relation Age of Onset  . COPD Mother   . Hypertension Mother   . GER disease Mother   . Heart attack Father   . Prostate cancer Father   . Hyperlipidemia Brother   . Hypertension Brother   . Diabetes Brother   . Neuropathy Brother   . Hyperlipidemia Brother   . Hypertension Brother   . Heart attack Paternal Grandmother   . Heart attack Paternal Grandfather   . Stroke Paternal Grandfather   . Cancer Paternal Uncle        prostate and bone  . Cancer  Paternal Uncle        prostate and bone  . Colon cancer Neg Hx   . Stomach cancer Neg Hx     Past Surgical History:  Procedure Laterality Date  . BACK SURGERY  2004   l4/5 fusion  . BREAST REDUCTION SURGERY Bilateral 08/08/2014   Procedure: BILATERAL BREAST REDUCTION  (BREAST);  Surgeon: Irene Limbo, MD;  Location: [place];  Service: Plastics;  Laterality: Bilateral;  . CHOLECYSTECTOMY     1998  . KNEE ARTHROSCOPY    . LAPAROSCOPIC BILATERAL SALPINGO OOPHERECTOMY Bilateral 03/11/2015   Procedure: LAPAROSCOPIC BILATERAL SALPINGO OOPHORECTOMY;  Surgeon: Florian Buff, MD;  Location: [facility];  Service: Gynecology;  Laterality: Bilateral;  . LAPAROSCOPIC GASTRIC SLEEVE RESECTION WITH HIATAL HERNIA REPAIR N/A 03/22/2016   Procedure: LAPAROSCOPIC GASTRIC SLEEVE RESECTION WITH HIATAL HERNIA REPAIR WITHUPPER ENDOSCOPY;  Surgeon: Alphonsa Overall, MD;  [facility];  Service: General;  Laterality: N/A;  . REDUCTION MAMMAPLASTY Bilateral   . SEPTOPLASTY  1988  . tonsillectomy  1991  . TONSILLECTOMY     Social History   Occupational History  . Occupation: stay at home mom  Tobacco Use  . Smoking status: Never Smoker  . Smokeless tobacco: Never Used  Substance and Sexual Activity  . Alcohol use: No  . Drug use: No  . Sexual activity: Not Currently    Partners: Male    Birth control/protection: Post-menopausal, Surgical    Comment: BSO 2016

## 2018-12-26 ENCOUNTER — Telehealth: Payer: Self-pay

## 2018-12-26 NOTE — Telephone Encounter (Signed)
PA started  ([id number]) waiting for results.

## 2018-12-27 NOTE — Telephone Encounter (Signed)
PA approved.

## 2018-12-27 NOTE — Telephone Encounter (Signed)
Sent pt mychart message to make pt aware and pharmacy aware.

## 2019-01-01 ENCOUNTER — Encounter: Payer: Self-pay | Admitting: Orthopaedic Surgery

## 2019-01-01 ENCOUNTER — Other Ambulatory Visit: Payer: Self-pay

## 2019-01-01 ENCOUNTER — Ambulatory Visit (INDEPENDENT_AMBULATORY_CARE_PROVIDER_SITE_OTHER): Payer: Medicare HMO | Admitting: Orthopaedic Surgery

## 2019-01-01 VITALS — BP 133/83 | HR 95 | Ht 64.0 in | Wt 219.0 lb

## 2019-01-01 DIAGNOSIS — M1712 Unilateral primary osteoarthritis, left knee: Secondary | ICD-10-CM | POA: Diagnosis not present

## 2019-01-01 MED ORDER — HYALURONAN 30 MG/2ML IX SOSY
30.0000 mg | PREFILLED_SYRINGE | INTRA_ARTICULAR | Status: AC | PRN
  Administered 2019-01-01: 30 mg via INTRA_ARTICULAR

## 2019-01-01 NOTE — Progress Notes (Signed)
Office Visit Note   Patient: Cassandra Mcconnell           Date of Birth: 1966/02/27           MRN: [id number] Visit Date: 01/01/2019              Requested by: Ronnald Nian, DO [place],  [place] [address] PCP: Ronnald Nian, DO   Assessment & Plan: Visit Diagnoses:  1. Unilateral primary osteoarthritis, left knee     Plan: Third and final Orthovisc injection left knee.  Doing well  Follow-Up Instructions: Return if symptoms worsen or fail to improve.   Orders:  Orders Placed This Encounter  Procedures  . Large Joint Inj: L knee   No orders of the defined types were placed in this encounter.     Procedures: Large Joint Inj: L knee on 01/01/2019 4:15 PM Indications: pain and joint swelling Details: 25 G 1.5 in needle, anteromedial approach  Arthrogram: No  Medications: 30 mg Hyaluronan 30 MG/2ML Outcome: tolerated well, no immediate complications Procedure, treatment alternatives, risks and benefits explained, specific risks discussed. Consent was given by the patient. Immediately prior to procedure a time out was called to verify the correct patient, procedure, equipment, support staff and site/side marked as required. Patient was prepped and draped in the usual sterile fashion.       Clinical Data: No additional findings.   Subjective: Chief Complaint  Patient presents with  . Left Knee - Follow-up    orthovisc started 12/18/2018  Patient presents today for the third orthovisc injection in her left knee. She started the injections on 12/18/2018. Patient states that she is doing well. No complaints.  HPI  Review of Systems   Objective: Vital Signs: BP 133/83   Pulse 95   Ht 5\' 4"  (1.626 m)   Wt 219 lb (99.3 kg)   BMI 37.59 kg/m   Physical Exam  Ortho Exam left knee was not hot red warm or swollen.  No instability.  No significant medial or lateral joint pain  Specialty Comments:  No specialty comments available.   Imaging: No results found.   PMFS History: Patient Active Problem List   Diagnosis Date Noted  . Constipation 05/09/2018  . Dyspareunia in female 05/09/2018  . Pelvic pain 05/09/2018  . Unilateral primary osteoarthritis, left knee 11/21/2017  . Contusion of left elbow 11/21/2017  . Left facial numbness 04/21/2017  . Allergic rhinitis 03/14/2017  . Status post left knee surgery 02/20/2017  . Left knee pain 02/20/2017  . Hypotension 09/12/2016  . Fatigue 08/22/2016  . Postoperative nausea 04/21/2016  . Onychomycosis 10/15/2015  . Bell's palsy 09/29/2015  . Cutaneous skin tags 02/18/2015  . Complex ovarian cyst 10/15/2014  . Diverticulitis 10/12/2014  . Severe obesity (BMI >= 40) ([place]) 05/26/2014  . Depression 05/31/2013  . Morbid obesity ([place]) 12/04/2009  . 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 HYPERTROPHY, BREAST 11/20/2006  . Anxiety state 11/10/2006  . GERD 11/10/2006  . HYPERCHOLESTEROLEMIA, PURE 11/09/2006  . HYPERTENSION, BENIGN ESSENTIAL 11/09/2006  . CHOLECYSTECTOMY, HX OF 11/09/2006   Past Medical History:  Diagnosis Date  . Ankle fracture    left  . Anxiety   . Back pain   . Bell's palsy    Left side, 2009, 2017  . Bell's palsy   . Depression   . Diverticulitis   . GERD (gastroesophageal reflux disease)   . History of hiatal hernia   . Hyperlipidemia   .  Hypertension   . Postoperative nausea 04/21/2016  . Toe fracture    left great toe  . Wears glasses     Family History  Problem Relation Age of Onset  . COPD Mother   . Hypertension Mother   . GER disease Mother   . Heart attack Father   . Prostate cancer Father   . Hyperlipidemia Brother   . Hypertension Brother   . Diabetes Brother   . Neuropathy Brother   . Hyperlipidemia Brother   . Hypertension Brother   . Heart attack Paternal Grandmother   . Heart attack Paternal Grandfather   . Stroke Paternal Grandfather   . Cancer Paternal Uncle        prostate and bone  . Cancer  Paternal Uncle        prostate and bone  . Colon cancer Neg Hx   . Stomach cancer Neg Hx     Past Surgical History:  Procedure Laterality Date  . BACK SURGERY  2004   l4/5 fusion  . BREAST REDUCTION SURGERY Bilateral 08/08/2014   Procedure: BILATERAL BREAST REDUCTION  (BREAST);  Surgeon: Irene Limbo, MD;  Location: [place];  Service: Plastics;  Laterality: Bilateral;  . CHOLECYSTECTOMY     1998  . KNEE ARTHROSCOPY    . LAPAROSCOPIC BILATERAL SALPINGO OOPHERECTOMY Bilateral 03/11/2015   Procedure: LAPAROSCOPIC BILATERAL SALPINGO OOPHORECTOMY;  Surgeon: Florian Buff, MD;  Location: [facility];  Service: Gynecology;  Laterality: Bilateral;  . LAPAROSCOPIC GASTRIC SLEEVE RESECTION WITH HIATAL HERNIA REPAIR N/A 03/22/2016   Procedure: LAPAROSCOPIC GASTRIC SLEEVE RESECTION WITH HIATAL HERNIA REPAIR WITHUPPER ENDOSCOPY;  Surgeon: Alphonsa Overall, MD;  [facility];  Service: General;  Laterality: N/A;  . REDUCTION MAMMAPLASTY Bilateral   . SEPTOPLASTY  1988  . tonsillectomy  1991  . TONSILLECTOMY     Social History   Occupational History  . Occupation: stay at home mom  Tobacco Use  . Smoking status: Never Smoker  . Smokeless tobacco: Never Used  Substance and Sexual Activity  . Alcohol use: No  . Drug use: No  . Sexual activity: Not Currently    Partners: Male    Birth control/protection: Post-menopausal, Surgical    Comment: BSO 2016

## 2019-01-10 ENCOUNTER — Other Ambulatory Visit: Payer: Self-pay

## 2019-01-10 ENCOUNTER — Encounter: Payer: Self-pay | Admitting: Nurse Practitioner

## 2019-01-10 ENCOUNTER — Telehealth: Payer: Self-pay | Admitting: *Deleted

## 2019-01-10 ENCOUNTER — Telehealth (INDEPENDENT_AMBULATORY_CARE_PROVIDER_SITE_OTHER): Payer: Medicare HMO | Admitting: Nurse Practitioner

## 2019-01-10 VITALS — Ht 64.0 in | Wt 206.0 lb

## 2019-01-10 DIAGNOSIS — G510 Bell's palsy: Secondary | ICD-10-CM | POA: Diagnosis not present

## 2019-01-10 MED ORDER — PREDNISONE 20 MG PO TABS: ORAL_TABLET | ORAL | 0 refills | Status: AC

## 2019-01-10 MED ORDER — VALACYCLOVIR HCL 1 G PO TABS: 1000.0000 mg | ORAL_TABLET | Freq: Three times a day (TID) | ORAL | 0 refills | Status: DC

## 2019-01-10 NOTE — Telephone Encounter (Signed)
Patient has an appointment on Monday but feels she needs to be seen sooner.  States she is having left side head and ear pain.  Worried she is going to have another bout of bells palsy.  Please call as soon as possible.

## 2019-01-10 NOTE — Telephone Encounter (Signed)
Spoke with pt. She stated she's had the pain x 3 days and is concerned this is the cranial nerve. The symptoms are lasting longer than she has had before. I offered pt 3 different afternoon appointments with Amy NP this afternoon but pt unable to come d/t neurosurgery appt for post-op eval around 1:15/1:30. Pt aware Dr. Cathren Laine schedule full this afternoon. No other NP availability. Pt advised if her symptoms are progressive, worsening, we advise she go to the ED. Also suggested she call PCP if she can to see if they can see her today or tomorrow at a time that works for her. I told her I would call if I am made aware of any other availability today. Pt verbalized understanding and appreciation.

## 2019-01-10 NOTE — Progress Notes (Signed)
Virtual Visit via Video Note  I connected with Cassandra Mcconnell on 01/10/19 at  1:00 PM EDT by a video enabled telemedicine application and verified that I am speaking with the correct person using two identifiers.  Location: Patient: home Provider: office   I discussed the limitations of evaluation and management by telemedicine and the availability of in person appointments. The patient expressed understanding and agreed to proceed.  History of Present Illness: Otalgia  There is pain in the left ear. This is a recurrent problem. The current episode started in the past 7 days. The problem occurs constantly. The problem has been unchanged. There has been no fever. The pain is severe. Associated symptoms include headaches. Pertinent negatives include no coughing, ear discharge, hearing loss, neck pain or rash. She has tried NSAIDs for the symptoms. The treatment provided moderate relief. There is no history of a chronic ear infection, hearing loss or a tympanostomy tube.  hx of Bells Palsy with left facial asymmetry, states she had similar pain with previous exacerbations, denies any change in vision, no swollen lymph nodes, no neck stiffness, no arm weakness or paresthesia, no rash   Observations/Objective: Physical Exam  Constitutional: She is oriented to person, place, and time. No distress.  Eyes: EOM are normal.  Neck: Normal range of motion. Neck supple.  Pulmonary/Chest: Effort normal.  Neurological: She is alert and oriented to person, place, and time.  Left facial asymmetry (she state this is chronic) Clear speech.  Psychiatric: She has a normal mood and affect. Her behavior is normal.  unable to provide any vital signs  Assessment and Plan: Glee was seen today for pain.  Diagnoses and all orders for this visit:  Bell's palsy -     valACYclovir (VALTREX) 1000 MG tablet; Take 1 tablet (1,000 mg total) by mouth 3 (three) times daily. -     predniSONE (DELTASONE) 20 MG tablet;  Take 3 tablets (60 mg total) by mouth daily with breakfast for 1 day, THEN 2 tablets (40 mg total) daily with breakfast for 2 days, THEN 1 tablet (20 mg total) daily with breakfast for 2 days, THEN 0.5 tablets (10 mg total) daily with breakfast for 2 days.   Follow Up Instructions: See avs   I discussed the assessment and treatment plan with the patient. The patient was provided an opportunity to ask questions and all were answered. The patient agreed with the plan and demonstrated an understanding of the instructions.   The patient was advised to call back or seek an in-person evaluation if the symptoms worsen or if the condition fails to improve as anticipated.   Wilfred Lacy, NP

## 2019-01-10 NOTE — Patient Instructions (Signed)
Call office if no improvement within 72hrs Maintain appt with neurology.

## 2019-01-14 ENCOUNTER — Ambulatory Visit (INDEPENDENT_AMBULATORY_CARE_PROVIDER_SITE_OTHER): Payer: Medicare HMO | Admitting: Neurology

## 2019-01-14 ENCOUNTER — Other Ambulatory Visit: Payer: Self-pay

## 2019-01-14 ENCOUNTER — Encounter: Payer: Self-pay | Admitting: Neurology

## 2019-01-14 VITALS — BP 132/72 | HR 88 | Temp 96.9°F | Ht 64.0 in | Wt 213.0 lb

## 2019-01-14 DIAGNOSIS — M5481 Occipital neuralgia: Secondary | ICD-10-CM | POA: Diagnosis not present

## 2019-01-14 MED ORDER — METHYLPREDNISOLONE 4 MG PO TBPK: ORAL_TABLET | ORAL | 1 refills | Status: DC

## 2019-01-14 NOTE — Progress Notes (Signed)
[facility]    Provider:  Dr Jaynee Eagles Referring Provider: Celene Squibb, MD Primary Care Physician:  Ronnald Nian, DO  CC:  Left-sided facial droop dxed with Bell's Palsy with synkinesis  Interval history January 14, 2019: Patient with Bell's palsy (2009 and 2017).  She has been stable for several years.  MRI of the brain August 2018 was normal, no abnormality to explain symptoms, no evidence of cranial nerve or skull base pathology. Patient called with left-sided head and ear pain, she was worried she was going to have another bout of Bell's palsy, we moved up her appointment to today.  In the past labs included in 2017 normal Lyme's disease, ACE inhibitor, Sjogren's, thyroid, CBC, CMP, B12 (B12 last in 2018).  She started having pain in the left back of the head, but in the setting of of recent 3-level acdf in August which may have caused occipital neuralgia. She had significant arthritic changes, she is feeling much better. The head pain is resolved, her primary called her in some prednisone and helped. It was pulsating, no migrainous features, lasted 1.5 days. Resolved. Still has synkinesis.   Interval history 11/27/2017: Patient with Bell's Palsy(2009 and 2017). She takes gabapentin for nerve pain. She has been stable for several years. She is under significant stress with her son who was hospitalized for psychiatric issues. MRI of the brain was ordered in 03/2017 but was not completed by patient.  She has spasms on the left side of the face, no pain, Likely synkinesis. She has hand numbness recommended wrist splints and can consider emg/ncs in the future.   HPI:  Cassandra Mcconnell is a 53 y.o. female here as a referral from Dr. Nevada Crane for Bell's palsy. Past medical history of anxiety, hyperlipidemia, hypertension, depression, Bell's palsy. She started getting a headache last Monday and Tuesday she noticed weakness, she woke up Wednesday with weakness and ear pain. No  inciting events, no trauma, no rash or other. She is having associated pain in the left ear. Things sound louder. It is giving her a headache. tylenol and ibuprofen not helping. She has a history of remote bell's palsy that cleared up in 2 weeks. Patient went to the ED and she was started on prednisone and valtrex. She is using drops in the eyes to lubricate, she is aware of the risk of drying out and corneal damage, wearing glasses all the time to protect her eyes given decrease in blink rate (discussed all these again and the need for vigilance). She can shut her eye and so her eye is closed all night, no dry eye in the morning. No inciting event or head trauma or illness. Her eye is blurry on the left. She has been stressed. She has left facial droop, difficulty closing the left eye with decreased blink, blurry left eye vision. No other focal neurologic symptoms or complaints.  Reviewed notes, labs and imaging from outside physicians, which showed:  CT of the head in 2009 showed No acute intracranial abnormalities including mass lesion or mass effect, hydrocephalus, extra-axial fluid collection, midline shift, hemorrhage, or acute infarction, large ischemic events (personally reviewed images)  Reviewed records. Patient presented to the emergency room on 09/22/2015. She complained of constant, gradually worsening left-sided facial droop upon awakening that morning. Associated symptoms were headache, fatigue and left ear pain. The pain in the left ear began followed by draping. She has a history of Bell's palsy with right-sided facial droop in the past. Denied any  other complaints. Exam was significant for facial droop on the left, and ability to wrinkle forehead on the left, intact sensation. She was diagnosed with Bell's palsy. Supportive care and treatment was recommended.  Review of Systems: Patient complains of symptoms per HPI as well as the following symptoms: Blurred vision, headache, numbness,  weakness, slurred speech, anxiety. Pertinent negatives per HPI. All others negative.   Social History   Socioeconomic History  . Marital status: Married    Spouse name: Not on file  . Number of children: 1  . Years of education: Not on file  . Highest education level: Not on file  Occupational History  . Occupation: stay at home mom  Social Needs  . Financial resource strain: Not on file  . Food insecurity    Worry: Not on file    Inability: Not on file  . Transportation needs    Medical: Not on file    Non-medical: Not on file  Tobacco Use  . Smoking status: Never Smoker  . Smokeless tobacco: Never Used  Substance and Sexual Activity  . Alcohol use: No  . Drug use: No  . Sexual activity: Not Currently    Partners: Male    Birth control/protection: Post-menopausal, Surgical    Comment: BSO 2016  Lifestyle  . Physical activity    Days per week: Not on file    Minutes per session: Not on file  . Stress: Not on file  Relationships  . Social Herbalist on phone: Not on file    Gets together: Not on file    Attends religious service: Not on file    Active member of club or organization: Not on file    Attends meetings of clubs or organizations: Not on file    Relationship status: Not on file  . Intimate partner violence    Fear of current or ex partner: Not on file    Emotionally abused: Not on file    Physically abused: Not on file    Forced sexual activity: Not on file  Other Topics Concern  . Not on file  Social History Narrative   Lives at home with her husband   Left handed   Caffeine: 2-4 cups of coffee daily    Family History  Problem Relation Age of Onset  . COPD Mother   . Hypertension Mother   . GER disease Mother   . Heart attack Father   . Prostate cancer Father   . Hyperlipidemia Brother   . Hypertension Brother   . Diabetes Brother   . Neuropathy Brother   . Hyperlipidemia Brother   . Hypertension Brother   . Heart attack  Paternal Grandmother   . Heart attack Paternal Grandfather   . Stroke Paternal Grandfather   . Cancer Paternal Uncle        prostate and bone  . Cancer Paternal Uncle        prostate and bone  . Colon cancer Neg Hx   . Stomach cancer Neg Hx     Past Medical History:  Diagnosis Date  . Ankle fracture    left  . Anxiety   . Back pain   . Bell's palsy    Left side, 2009, 2017  . Bell's palsy   . Depression   . Diverticulitis   . GERD (gastroesophageal reflux disease)   . History of hiatal hernia   . Hyperlipidemia   . Hypertension   . Postoperative nausea 04/21/2016  .  Toe fracture    left great toe  . Wears glasses     Past Surgical History:  Procedure Laterality Date  . BACK SURGERY  2004   l4/5 fusion  . BREAST REDUCTION SURGERY Bilateral 08/08/2014   Procedure: BILATERAL BREAST REDUCTION  (BREAST);  Surgeon: Irene Limbo, MD;  Location: [facility];  Service: Plastics;  Laterality: Bilateral;  . CERVICAL DISCECTOMY  12/12/2018   3 level   . CHOLECYSTECTOMY     1998  . INJECTION KNEE Left   . KNEE ARTHROSCOPY    . LAPAROSCOPIC BILATERAL SALPINGO OOPHERECTOMY Bilateral 03/11/2015   Procedure: LAPAROSCOPIC BILATERAL SALPINGO OOPHORECTOMY;  Surgeon: Florian Buff, MD;  Location: [facility];  Service: Gynecology;  Laterality: Bilateral;  . LAPAROSCOPIC GASTRIC SLEEVE RESECTION WITH HIATAL HERNIA REPAIR N/A 03/22/2016   Procedure: LAPAROSCOPIC GASTRIC SLEEVE RESECTION WITH HIATAL HERNIA REPAIR WITHUPPER ENDOSCOPY;  Surgeon: Alphonsa Overall, MD;  Location: [facility];  Service: General;  Laterality: N/A;  . REDUCTION MAMMAPLASTY Bilateral   . SEPTOPLASTY  1988  . tonsillectomy  1991  . TONSILLECTOMY      Current Outpatient Medications  Medication Sig Dispense Refill  . acetaminophen (TYLENOL) 500 MG tablet Take 1,000 mg by mouth every 6 (six) hours as needed. For pain     . atorvastatin (LIPITOR) 10 MG tablet Take 10 mg by mouth daily.    . citalopram  (CELEXA) 20 MG tablet Take 1 tablet (20 mg total) by mouth 2 (two) times a day. 180 tablet 3  . escitalopram (LEXAPRO) 5 MG tablet Take 1 tablet (5 mg total) by mouth at bedtime. 90 tablet 3  . famotidine (PEPCID) 10 MG tablet Take 10 mg by mouth 2 (two) times daily.    Marland Kitchen HYDROmorphone (DILAUDID) 4 MG tablet Take by mouth every 6 (six) hours as needed for severe pain.     . IBUPROFEN PO Take by mouth as needed.    . loratadine (CLARITIN) 10 MG tablet Take 10-20 mg by mouth daily as needed.     . polyvinyl alcohol-povidone (REFRESH) 1.4-0.6 % ophthalmic solution Place 1-2 drops into both eyes daily as needed (for dry eye relief).     . predniSONE (DELTASONE) 20 MG tablet Take 3 tablets (60 mg total) by mouth daily with breakfast for 1 day, THEN 2 tablets (40 mg total) daily with breakfast for 2 days, THEN 1 tablet (20 mg total) daily with breakfast for 2 days, THEN 0.5 tablets (10 mg total) daily with breakfast for 2 days. 10 tablet 0  . tiZANidine (ZANAFLEX) 4 MG tablet Take 4 mg by mouth every 6 (six) hours as needed for muscle spasms.    . valACYclovir (VALTREX) 1000 MG tablet Take 1 tablet (1,000 mg total) by mouth 3 (three) times daily. 21 tablet 0  . methylPREDNISolone (MEDROL DOSEPAK) 4 MG TBPK tablet Take pills each morning with food for 6 days. For occipital neuralgia. 21 tablet 1   No current facility-administered medications for this visit.     Allergies as of 01/14/2019 - Review Complete 01/14/2019  Allergen Reaction Noted  . Zofran [ondansetron hcl] Shortness Of Breath 10/15/2014  . Azithromycin Swelling 12/29/2011  . Conjugated estrogens Other (See Comments)   . Cyclobenzaprine hcl  03/12/2010  . Dicyclomine hcl Other (See Comments)   . Norco [hydrocodone-acetaminophen] Other (See Comments) 10/12/2014  . Nsaids Other (See Comments) 04/21/2016  . Oxycontin [oxycodone]  01/14/2019  . Percocet [oxycodone-acetaminophen]  10/15/2014  . Robaxin [methocarbamol]  01/14/2019  .  Rosuvastatin Dermatitis   . Wellbutrin [bupropion hcl] Other (See Comments) 02/15/2011    Vitals: BP 132/72 (BP Location: Right Arm, Patient Position: Sitting)   Pulse 88   Temp (!) 96.9 F (36.1 C) Comment: taken by check in staff  Ht 5\' 4"  (1.626 m)   Wt 213 lb (96.6 kg)   BMI 36.56 kg/m  Last Weight:  Wt Readings from Last 1 Encounters:  01/14/19 213 lb (96.6 kg)   Last Height:   Ht Readings from Last 1 Encounters:  01/14/19 5\' 4"  (1.626 m)    Physical exam: Exam: Gen: NAD, conversant, well nourised, obese, well groomed                     CV: RRR, no MRG. No Carotid Bruits. No peripheral edema, warm, nontender Eyes: Conjunctivae clear without exudates or hemorrhage  Neuro: Detailed Neurologic Exam  Speech:    Speech is normal; fluent and spontaneous with normal comprehension.  Cognition:    The patient is oriented to person, place, and time;     recent and remote memory intact;     language fluent;     normal attention, concentration,     fund of knowledge Cranial Nerves:    The pupils are equal, round, and reactive to light. The fundi are normal and spontaneous venous pulsations are present. Visual fields are full to finger confrontation. Extraocular movements are intact. Trigeminal sensation is intact and the muscles of mastication are normal. Left lower facial droop, incomplete closure of left eye with weakness and droop, weakness of left forehead on eyebrow elevation, Bell's phenomena. Facial sensation impaired per patient. The palate elevates in the midline. Hearing intact. Voice is normal. Shoulder shrug is normal. The tongue has normal motion without fasciculations.   Coordination:    Normal finger to nose and heel to shin. Normal rapid alternating movements.   Gait:    Slightly wide based, large body habitus  Motor Observation:    No asymmetry, no atrophy, and no involuntary movements noted. Tone:    Normal muscle tone.    Posture:    Posture is  normal. normal erect    Strength:    Strength is V/V in the upper and lower limbs.      Sensation: intact to LT     Reflex Exam:  DTR's:    Deep tendon reflexes in the upper and lower extremities are normal bilaterally.   Toes:    The toes are downgoing bilaterally.   Clonus:    Clonus is absent.      Assessment/Plan:  Patient with Bell's palsy (2009 and 2017).  She has been stable for several years.  MRI of the brain August 2018 was normal, no abnormality to explain symptoms, no evidence of cranial nerve or skull base pathology. Patient called with left-sided head and ear pain, she was worried she was going to have another bout of Bell's palsy, we moved up her appointment to today.  In the past labs included in 2017 normal Lyme's disease, ACE inhibitor, Sjogren's, thyroid, CBC, CMP, B12 (B12 last in 2018). Did not completely recover, now with synkinesis of the left face.  - had occipital nerve irritation and left occipital pain in the setting of acdf. Likely occipital nerve irritation, resolved with steroids. Will give a medrol dosepak to have on hand in case it happens again. Doesn't like needles so no nerve block. No bell's palsy, she was worried.   - Synkinesis: Synkinesis occurs secondary  to abnormal facial nerve regeneration after Bell's palsy, or in instances where the facial nerve has been cut and sewn back together. The facial nerve fibers can implant into the different muscles in cases of Bell's palsy.  - She was referred to a facial surgeon in the past to see if any surgical options for left bells palsy for nerve transplantation or decompression   Sarina Ill, MD  [facility] [address] [place], [place] [address]  Phone [phone] Fax [phone]  A total of 25 minutes was spent face-to-face with this patient. Over half this time was spent on counseling patient on the  1. Occipital neuralgia of left side     diagnosis and  different diagnostic and therapeutic options, counseling and coordination of care, risks ans benefits of management, compliance, or risk factor reduction and education.

## 2019-01-14 NOTE — Patient Instructions (Addendum)
Occipital Neuralgia  Occipital neuralgia is a type of headache that causes brief episodes of very bad pain in the back of your head. Pain from occipital neuralgia may spread (radiate) to other parts of your head. These headaches may be caused by irritation of the nerves that leave your spinal cord high up in your neck, just below the base of your skull (occipital nerves). Your occipital nerves transmit sensations from the back of your head, the top of your head, and the areas behind your ears. What are the causes? This condition can occur without any known cause (primary headache syndrome). In other cases, this condition is caused by pressure on or irritation of one of the two occipital nerves. Pressure and irritation may be due to:  Muscle spasm in the neck.  Neck injury.  Wear and tear of the vertebrae in the neck (osteoarthritis).  Disease of the disks that separate the vertebrae.  Swollen blood vessels that put pressure on the occipital nerves.  Infections.  Tumors.  Diabetes. What are the signs or symptoms? This condition causes brief burning, stabbing, electric, shocking, or shooting pain which can radiate to the top of the head. It can happen on one side or both sides of the head. It can also cause:  Pain behind the eye.  Pain triggered by neck movement or hair brushing.  Scalp tenderness.  Aching in the back of the head between episodes of very bad pain.  Pain gets worse with exposure to bright lights. How is this diagnosed? There is no test that diagnoses this condition. Your health care provider may diagnose this condition based on a physical exam and your symptoms. Other tests may be done, such as:  Imaging studies of the brain and neck (cervical spine), such as an MRI or CT scan. These look for causes of pinched nerves.  Applying pressure to the nerves in the neck to try to re-create the pain.  Injection of numbing medicine into the occipital nerve areas to see if  pain goes away (diagnostic nerve block). How is this treated? Treatment for this condition may begin with simple measures, such as:  Rest.  Massage.  Applying heat or cold on the area.  Over-the-counter pain relievers. If these measures do not work, you may need other treatments, including:  Medicines, such as: ? Prescription-strength anti-inflammatory medicines. ? Muscle relaxants. ? Anti-seizure medicines, which can relieve pain. ? Antidepressants, which can relieve pain. ? Injected medicines, such as medicines that numb the area (local anesthetic) and steroids.  Pulsed radiofrequency ablation. This is when wires are implanted to deliver electrical impulses that block pain signals from the occipital nerve.  Surgery to relieve nerve pressure.  Physical therapy. Follow these instructions at home: Pain management      Avoid any activities that cause pain.  Rest when you have an attack of pain.  Try gentle massage to relieve pain.  Try a different pillow or sleeping position.  If directed, apply heat to the affected area as told by your health care provider. Use the heat source that your health care provider recommends, such as a moist heat pack or a heating pad. ? Place a towel between your skin and the heat source. ? Leave the heat on for 20-30 minutes. ? Remove the heat if your skin turns bright red. This is especially important if you are unable to feel pain, heat, or cold. You may have a greater risk of getting burned.  If directed, apply ice to the   back of the head and neck area as told by your health care provider. ? Put ice in a plastic bag. ? Place a towel between your skin and the bag. ? Leave the ice on for 20 minutes, 2-3 times per day. General instructions  Take over-the-counter and prescription medicines only as told by your health care provider.  Avoid things that make your symptoms worse, such as bright lights.  Try to stay active. Get regular  exercise that does not cause pain. Ask your health care provider to suggest safe exercises for you.  Work with a physical therapist to learn stretching exercises you can do at home.  Practice good posture.  Keep all follow-up visits as told by your health care provider. This is important. Contact a health care provider if:  Your medicine is not working.  You have new or worsening symptoms. Get help right away if:  You have very bad head pain that does not go away.  You have a sudden change in vision, balance, or speech. Summary  Occipital neuralgia is a type of headache that causes brief episodes of very bad pain in the back of your head.  Pain from occipital neuralgia may spread (radiate) to other parts of your head.  Treatment for this condition includes rest, massage, and medicines. This information is not intended to replace advice given to you by your health care provider. Make sure you discuss any questions you have with your health care provider. Document Released: 04/05/2001 Document Revised: 03/28/2017 Document Reviewed: 06/16/2016 Elsevier Patient Education  [place].  Methylprednisolone tablets What is this medicine? METHYLPREDNISOLONE (meth ill pred NISS oh lone) is a corticosteroid. It is commonly used to treat inflammation of the skin, joints, lungs, and other organs. Common conditions treated include asthma, allergies, and arthritis. It is also used for other conditions, such as blood disorders and diseases of the adrenal glands. This medicine may be used for other purposes; ask your health care provider or pharmacist if you have questions. COMMON BRAND NAME(S): Medrol, Medrol Dosepak What should I tell my health care provider before I take this medicine? They need to know if you have any of these conditions:  Cushing's syndrome  eye disease, vision problems  diabetes  glaucoma  heart disease  high blood pressure  infection (especially a virus  infection such as chickenpox, cold sores, or herpes)  liver disease  mental illness  myasthenia gravis  osteoporosis  recently received or scheduled to receive a vaccine  seizures  stomach or intestine problems  thyroid disease  an unusual or allergic reaction to lactose, methylprednisolone, other medicines, foods, dyes, or preservatives  pregnant or trying to get pregnant  breast-feeding How should I use this medicine? Take this medicine by mouth with a glass of water. Follow the directions on the prescription label. Take this medicine with food. If you are taking this medicine once a day, take it in the morning. Do not take it more often than directed. Do not suddenly stop taking your medicine because you may develop a severe reaction. Your doctor will tell you how much medicine to take. If your doctor wants you to stop the medicine, the dose may be slowly lowered over time to avoid any side effects. Talk to your pediatrician regarding the use of this medicine in children. Special care may be needed. Overdosage: If you think you have taken too much of this medicine contact a poison control center or emergency room at once. NOTE: This medicine  is only for you. Do not share this medicine with others. What if I miss a dose? If you miss a dose, take it as soon as you can. If it is almost time for your next dose, talk to your doctor or health care professional. You may need to miss a dose or take an extra dose. Do not take double or extra doses without advice. What may interact with this medicine? Do not take this medicine with any of the following medications:  alefacept  echinacea  live virus vaccines  metyrapone  mifepristone This medicine may also interact with the following medications:  amphotericin B  aspirin and aspirin-like medicines  certain antibiotics like erythromycin, clarithromycin, troleandomycin  certain medicines for diabetes  certain medicines for  fungal infections like ketoconazole  certain medicines for seizures like carbamazepine, phenobarbital, phenytoin  certain medicines that treat or prevent blood clots like warfarin  cholestyramine  cyclosporine  digoxin  diuretics  female hormones, like estrogens and birth control pills  isoniazid  NSAIDs, medicines for pain inflammation, like ibuprofen or naproxen  other medicines for myasthenia gravis  rifampin  vaccines This list may not describe all possible interactions. Give your health care provider a list of all the medicines, herbs, non-prescription drugs, or dietary supplements you use. Also tell them if you smoke, drink alcohol, or use illegal drugs. Some items may interact with your medicine. What should I watch for while using this medicine? Tell your doctor or healthcare professional if your symptoms do not start to get better or if they get worse. Do not stop taking except on your doctor's advice. You may develop a severe reaction. Your doctor will tell you how much medicine to take. This medicine may increase your risk of getting an infection. Tell your doctor or health care professional if you are around anyone with measles or chickenpox, or if you develop sores or blisters that do not heal properly. This medicine may increase blood sugar levels. Ask your healthcare provider if changes in diet or medicines are needed if you have diabetes. Tell your doctor or health care professional right away if you have any change in your eyesight. Using this medicine for a long time may increase your risk of low bone mass. Talk to your doctor about bone health. What side effects may I notice from receiving this medicine? Side effects that you should report to your doctor or health care professional as soon as possible:  allergic reactions like skin rash, itching or hives, swelling of the face, lips, or tongue  bloody or tarry stools  hallucination, loss of contact with  reality  muscle cramps  muscle pain  palpitations  signs and symptoms of high blood sugar such as being more thirsty or hungry or having to urinate more than normal. You may also feel very tired or have blurry vision.  signs and symptoms of infection like fever or chills; cough; sore throat; pain or trouble passing urine Side effects that usually do not require medical attention (report to your doctor or health care professional if they continue or are bothersome):  changes in emotions or mood  constipation  diarrhea  excessive hair growth on the face or body  headache  nausea, vomiting  trouble sleeping  weight gain This list may not describe all possible side effects. Call your doctor for medical advice about side effects. You may report side effects to FDA at 1-800-FDA-1088. Where should I keep my medicine? Keep out of the reach of children.  Store at room temperature between 20 and 25 degrees C (68 and 77 degrees F). Throw away any unused medicine after the expiration date. NOTE: This sheet is a summary. It may not cover all possible information. If you have questions about this medicine, talk to your doctor, pharmacist, or health care provider.  2020 Elsevier/Gold Standard (2018-01-11 09:19:36)

## 2019-01-17 DIAGNOSIS — M542 Cervicalgia: Secondary | ICD-10-CM | POA: Diagnosis not present

## 2019-01-18 ENCOUNTER — Encounter (HOSPITAL_COMMUNITY): Payer: Self-pay

## 2019-01-22 ENCOUNTER — Other Ambulatory Visit: Payer: Self-pay

## 2019-01-22 ENCOUNTER — Encounter: Payer: Self-pay | Admitting: Family Medicine

## 2019-01-22 MED ORDER — ATORVASTATIN CALCIUM 10 MG PO TABS: 10.0000 mg | ORAL_TABLET | Freq: Every day | ORAL | 2 refills | Status: DC

## 2019-02-01 ENCOUNTER — Emergency Department (HOSPITAL_COMMUNITY): Payer: Medicare HMO

## 2019-02-01 ENCOUNTER — Emergency Department (HOSPITAL_COMMUNITY)
Admission: EM | Admit: 2019-02-01 | Discharge: 2019-02-02 | Disposition: A | Discharge status: Home / Self Care | Payer: Medicare HMO | Attending: Emergency Medicine | Admitting: Emergency Medicine

## 2019-02-01 ENCOUNTER — Other Ambulatory Visit: Payer: Self-pay

## 2019-02-01 ENCOUNTER — Encounter (HOSPITAL_COMMUNITY): Payer: Self-pay | Admitting: Emergency Medicine

## 2019-02-01 DIAGNOSIS — S199XXA Unspecified injury of neck, initial encounter: Secondary | ICD-10-CM | POA: Diagnosis not present

## 2019-02-01 DIAGNOSIS — Y9389 Activity, other specified: Secondary | ICD-10-CM | POA: Diagnosis not present

## 2019-02-01 DIAGNOSIS — S3993XA Unspecified injury of pelvis, initial encounter: Secondary | ICD-10-CM | POA: Diagnosis not present

## 2019-02-01 DIAGNOSIS — S299XXA Unspecified injury of thorax, initial encounter: Secondary | ICD-10-CM | POA: Diagnosis not present

## 2019-02-01 DIAGNOSIS — S3992XA Unspecified injury of lower back, initial encounter: Secondary | ICD-10-CM | POA: Diagnosis not present

## 2019-02-01 DIAGNOSIS — M549 Dorsalgia, unspecified: Secondary | ICD-10-CM | POA: Diagnosis not present

## 2019-02-01 DIAGNOSIS — M25562 Pain in left knee: Secondary | ICD-10-CM | POA: Diagnosis not present

## 2019-02-01 DIAGNOSIS — S8992XA Unspecified injury of left lower leg, initial encounter: Secondary | ICD-10-CM | POA: Diagnosis not present

## 2019-02-01 DIAGNOSIS — M542 Cervicalgia: Secondary | ICD-10-CM

## 2019-02-01 DIAGNOSIS — Y999 Unspecified external cause status: Secondary | ICD-10-CM | POA: Insufficient documentation

## 2019-02-01 DIAGNOSIS — R0781 Pleurodynia: Secondary | ICD-10-CM | POA: Diagnosis not present

## 2019-02-01 DIAGNOSIS — M4322 Fusion of spine, cervical region: Secondary | ICD-10-CM | POA: Diagnosis not present

## 2019-02-01 DIAGNOSIS — Z79899 Other long term (current) drug therapy: Secondary | ICD-10-CM | POA: Diagnosis not present

## 2019-02-01 DIAGNOSIS — M545 Low back pain: Secondary | ICD-10-CM | POA: Diagnosis not present
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
## 2019-02-01 NOTE — ED Triage Notes (Signed)
Triage RN called to waiting room. Pt reports she needs to lay down, pt states"Im going to pass out. Vitals checked within normal limits, pt placed in recliner which patient initially refused.
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
## 2019-02-02 ENCOUNTER — Emergency Department (HOSPITAL_COMMUNITY): Payer: Medicare HMO

## 2019-02-02 DIAGNOSIS — S8992XA Unspecified injury of left lower leg, initial encounter: Secondary | ICD-10-CM | POA: Diagnosis not present

## 2019-02-02 MED ORDER — ORPHENADRINE CITRATE ER 100 MG PO TB12: 100.0000 mg | ORAL_TABLET | Freq: Two times a day (BID) | ORAL | 0 refills | Status: DC

## 2019-02-02 NOTE — ED Notes (Signed)
ED Provider at bedside. 

## 2019-02-02 NOTE — Discharge Instructions (Addendum)
Ice packs to the injured or sore muscles for the next several days then start using heat. Take acetaminophen for pain and the norflex for muscle spasms or pain. Return to the ED for any problems listed on the head injury sheet. Recheck if you aren't improving in the next week by Dr Durward Fortes, your orthopedist.

## 2019-02-02 NOTE — ED Notes (Signed)
Patient transported to X-ray 

## 2019-02-06 ENCOUNTER — Telehealth: Payer: Self-pay | Admitting: Family Medicine

## 2019-02-06 NOTE — Telephone Encounter (Signed)
Pt has a nurse visit on 02/07/2019 for tdap. Dr. Loletha Grayer please advise, ok for the pt to get this shot.

## 2019-02-06 NOTE — Telephone Encounter (Signed)

## 2019-02-07 ENCOUNTER — Ambulatory Visit: Payer: Medicare HMO

## 2019-02-07 NOTE — Telephone Encounter (Signed)
Yes ok for pt to get Tdap

## 2019-02-25 ENCOUNTER — Telehealth: Payer: Self-pay

## 2019-02-25 NOTE — Telephone Encounter (Signed)
Questions for Screening COVID-19  Symptom onset: None  Travel or Contacts: None  During this illness, did/does the patient experience any of the following symptoms? Fever >100.47F []   Yes [x]   No []   Unknown Subjective fever (felt feverish) []   Yes [x]   No []   Unknown Chills []   Yes [x]   No []   Unknown Muscle aches (myalgia) []   Yes [x]   No []   Unknown Runny nose (rhinorrhea) []   Yes [x]   No []   Unknown Sore throat []   Yes [x]   No []   Unknown Cough (new onset or worsening of chronic cough) []   Yes [x]   No []   Unknown Shortness of breath (dyspnea) []   Yes [x]   No []   Unknown Nausea or vomiting []   Yes [x]   No []   Unknown Headache []   Yes [x]   No []   Unknown Abdominal pain  []   Yes [x]   No []   Unknown Diarrhea (?3 loose/looser than normal stools/24hr period) []   Yes [x]   No []   Unknown Other, specify:  Patient risk factors: Smoker? []   Current []   Former []   Never If female, currently pregnant? []   Yes []   No  Patient Active Problem List   Diagnosis Date Noted  . Constipation 05/09/2018  . Dyspareunia in female 05/09/2018  . Pelvic pain 05/09/2018  . Unilateral primary osteoarthritis, left knee 11/21/2017  . Contusion of left elbow 11/21/2017  . Left facial numbness 04/21/2017  . Allergic rhinitis 03/14/2017  . Status post left knee surgery 02/20/2017  . Left knee pain 02/20/2017  . Hypotension 09/12/2016  . Fatigue 08/22/2016  . Postoperative nausea 04/21/2016  . Onychomycosis 10/15/2015  . Bell's palsy 09/29/2015  . Cutaneous skin tags 02/18/2015  . Complex ovarian cyst 10/15/2014  . Diverticulitis 10/12/2014  . Severe obesity (BMI >= 40) ([place]) 05/26/2014  . Depression 05/31/2013  . Morbid obesity ([place]) 12/04/2009  . 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 HYPERTROPHY, BREAST 11/20/2006  . Anxiety state 11/10/2006  . GERD 11/10/2006  . HYPERCHOLESTEROLEMIA, PURE 11/09/2006  . HYPERTENSION, BENIGN ESSENTIAL 11/09/2006  . CHOLECYSTECTOMY, HX OF 11/09/2006     Plan:  []   High risk for COVID-19 with red flags go to ED (with CP, SOB, weak/lightheaded, or fever > 101.5). Call ahead.  []   High risk for COVID-19 but stable. Inform provider and coordinate time for [facility] visit.   []   No red flags but URI signs or symptoms okay for [facility] visit.

## 2019-02-26 ENCOUNTER — Ambulatory Visit: Payer: Medicare HMO

## 2019-03-12 DIAGNOSIS — G510 Bell's palsy: Secondary | ICD-10-CM | POA: Insufficient documentation

## 2019-03-20 ENCOUNTER — Encounter: Payer: Self-pay | Admitting: Family Medicine

## 2019-03-20 ENCOUNTER — Telehealth (INDEPENDENT_AMBULATORY_CARE_PROVIDER_SITE_OTHER): Payer: Medicare HMO | Admitting: Family Medicine

## 2019-03-20 DIAGNOSIS — F339 Major depressive disorder, recurrent, unspecified: Secondary | ICD-10-CM

## 2019-03-20 DIAGNOSIS — F419 Anxiety disorder, unspecified: Secondary | ICD-10-CM | POA: Diagnosis not present
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
## 2019-03-27 DIAGNOSIS — F419 Anxiety disorder, unspecified: Secondary | ICD-10-CM | POA: Diagnosis not present

## 2019-04-02 ENCOUNTER — Telehealth: Payer: Self-pay | Admitting: Family Medicine

## 2019-04-02 NOTE — Telephone Encounter (Signed)
I called and spoke to patient, about medical release. We have not received a medical records release from [facility] per patient. Patient stated she would call them to have them refax and I offered to give patient the fax number to make sure they had the correct fax, and patient declined due to not having anything to write it down on.

## 2019-04-02 NOTE — Telephone Encounter (Signed)
Admin team please help, im not able to see this type of referral if we refer the pt out.    Copied from [place] [phone]. Topic: General - Other >> Apr 02, 2019 11:04 AM Celene Kras wrote: Reason for CRM: Pt called stating a medical records request was sent over from psychiatrist so that she could have an appt today and they never received pts medical records. Please advise.

## 2019-04-09 ENCOUNTER — Telehealth: Payer: Self-pay | Admitting: Family Medicine

## 2019-04-09 NOTE — Telephone Encounter (Signed)
Copied from [place] [phone]. Topic: General - Other >> Apr 09, 2019 10:02 AM Leward Quan A wrote: Reason for CRM: Patient called to schedule an appointment with Dr Deborra Medina when asked why not Dr C since she is her PCP she got a little snappy and say she want her appointment with Dr Deborra Medina who is on her insurance card and that her insurance provider told her that she need to see Dr Deborra Medina or get charged for the visit. >> Apr 09, 2019 12:16 PM Oneta Rack wrote: Patient states if accepted she would like a virtual TOC appt >> Apr 09, 2019 12:07 PM Oneta Rack wrote: Reached out to patient and advised her of the [facility] policy, patient voice understanding.patient would like to transfer from Dr. Bryan Lemma to Dr. Ethelene Hal, please advise the patient directly regarding the status.

## 2019-04-09 NOTE — Telephone Encounter (Signed)
Patient is requesting a TOC from Dr. Bryan Lemma to Dr. Ethelene Hal. Please advise.

## 2019-04-10 NOTE — Telephone Encounter (Signed)
Yes fine with me 

## 2019-04-11 ENCOUNTER — Encounter: Payer: Self-pay | Admitting: Family Medicine

## 2019-04-11 NOTE — Telephone Encounter (Signed)
No, I don't think that I would be a good fit for her.

## 2019-04-11 NOTE — Telephone Encounter (Addendum)
I called and spoke to patient. Patient informed that at this time the request for [facility] to Dr. Ethelene Hal has not been approved. Pt informed provider will not be able to see her and offered to give her the numbers of other [facility] primary care office she can call to [place]. Patient informed she would have to still do TOC from our office to another office. Patient wanted more information why provider would not see her, pt informed that provider did not agree to see her and no additional information given. Patient declined to take other offices phone number and information.

## 2019-04-11 NOTE — Telephone Encounter (Signed)
According to phone note 04/09/19 Dr. Ethelene Hal sent a message to [facility] stating he doesn't feel he would be a good fit. Not sure who this pt can be referred to

## 2019-04-16 ENCOUNTER — Ambulatory Visit: Payer: Medicare HMO | Admitting: Family Medicine

## 2019-04-23 DIAGNOSIS — F329 Major depressive disorder, single episode, unspecified: Secondary | ICD-10-CM | POA: Diagnosis not present

## 2019-05-01 DIAGNOSIS — R509 Fever, unspecified: Secondary | ICD-10-CM | POA: Diagnosis not present

## 2019-05-01 DIAGNOSIS — J019 Acute sinusitis, unspecified: Secondary | ICD-10-CM | POA: Diagnosis not present

## 2019-06-05 DIAGNOSIS — R1030 Lower abdominal pain, unspecified: Secondary | ICD-10-CM | POA: Diagnosis not present

## 2019-06-06 DIAGNOSIS — M5440 Lumbago with sciatica, unspecified side: Secondary | ICD-10-CM | POA: Diagnosis not present

## 2019-06-06 DIAGNOSIS — M542 Cervicalgia: Secondary | ICD-10-CM | POA: Diagnosis not present

## 2019-06-14 ENCOUNTER — Ambulatory Visit
Admission: EM | Admit: 2019-06-14 | Discharge: 2019-06-14 | Disposition: A | Discharge status: Home / Self Care | Payer: Medicare HMO | Attending: Emergency Medicine | Admitting: Emergency Medicine

## 2019-06-14 ENCOUNTER — Telehealth: Payer: Self-pay | Admitting: Emergency Medicine

## 2019-06-14 ENCOUNTER — Other Ambulatory Visit: Payer: Self-pay

## 2019-06-14 DIAGNOSIS — Z20822 Contact with and (suspected) exposure to covid-19: Secondary | ICD-10-CM

## 2019-06-14 DIAGNOSIS — N3000 Acute cystitis without hematuria: Secondary | ICD-10-CM | POA: Diagnosis not present

## 2019-06-14 LAB — POCT URINALYSIS DIP (MANUAL ENTRY)
Bilirubin, UA: NEGATIVE
Blood, UA: NEGATIVE
Glucose, UA: NEGATIVE mg/dL
Ketones, POC UA: NEGATIVE mg/dL
Nitrite, UA: NEGATIVE
Protein Ur, POC: NEGATIVE mg/dL
Spec Grav, UA: 1.025 (ref 1.010–1.025)
Urobilinogen, UA: 0.2 E.U./dL
pH, UA: 5.5 (ref 5.0–8.0)

## 2019-06-14 MED ORDER — BENZONATATE 100 MG PO CAPS: 100.0000 mg | ORAL_CAPSULE | Freq: Three times a day (TID) | ORAL | 0 refills | Status: DC

## 2019-06-14 MED ORDER — FLUCONAZOLE 200 MG PO TABS: ORAL_TABLET | ORAL | 0 refills | Status: DC

## 2019-06-14 MED ORDER — PYRIDOXINE HCL 25 MG PO TABS: 25.0000 mg | ORAL_TABLET | Freq: Every day | ORAL | 0 refills | Status: DC

## 2019-06-14 MED ORDER — NITROFURANTOIN MONOHYD MACRO 100 MG PO CAPS: 100.0000 mg | ORAL_CAPSULE | Freq: Two times a day (BID) | ORAL | 0 refills | Status: DC

## 2019-06-14 MED ORDER — CETIRIZINE HCL 10 MG PO TABS: 10.0000 mg | ORAL_TABLET | Freq: Every day | ORAL | 0 refills | Status: DC

## 2019-06-14 MED ORDER — FLUTICASONE PROPIONATE 50 MCG/ACT NA SUSP: 2.0000 | Freq: Every day | NASAL | 0 refills | Status: DC

## 2019-06-14 NOTE — Discharge Instructions (Addendum)
COVID testing ordered.  It will take between 2-5 days for test results.  Someone will contact you regarding abnormal results.    In the meantime: You should remain isolated in your home for 10 days from symptom onset AND greater than 72 hours after symptoms resolution (absence of fever without the use of fever-reducing medication and improvement in respiratory symptoms), whichever is longer Get plenty of rest and push fluids Tessalon Perles prescribed for cough zyrtec for nasal congestion, runny nose, and/or sore throat flonase for nasal congestion and runny nose Use medications daily for symptom relief Use OTC medications like ibuprofen or tylenol as needed fever or pain Call or go to the ED if you have any new or worsening symptoms such as fever, worsening cough, shortness of breath, chest tightness, chest pain, turning blue, changes in mental status, etc...  

## 2019-06-14 NOTE — ED Triage Notes (Signed)
Pt presents to UC w/ c/o cough, runny nose, nausea, headaches, chills x1 week. Positive covid exp, husband.

## 2019-06-14 NOTE — ED Provider Notes (Addendum)
[place]   [id number] 06/14/19 Arrival Time: [id number]   CC: COVID symptoms  SUBJECTIVE: History from: patient.  Cassandra Mcconnell is a 54 y.o. female who presents with runny nose, cough, nausea, headache, and chills x 1 week.  Admit to positive COVID exposure to husband.  Has tried OTC medications minimal relief.  Denies aggravating factors.  Denies previous COVID infection   Denies fever, chills, sinus pain, sore throat, SOB, wheezing, chest pain, vomiting, changes in bowel or bladder habits.    Patient treated for UTI with cipro apx 10 days ago.  However, experienced nausea with cipro and discontinued after three days.    ROS: As per HPI.  All other pertinent ROS negative.     Past Medical History:  Diagnosis Date  . Ankle fracture    left  . Anxiety   . Back pain   . Bell's palsy    Left side, 2009, 2017  . Bell's palsy   . Depression   . Diverticulitis   . GERD (gastroesophageal reflux disease)   . History of hiatal hernia   . Hyperlipidemia   . Hypertension   . Postoperative nausea 04/21/2016  . Toe fracture    left great toe  . Wears glasses    Past Surgical History:  Procedure Laterality Date  . BACK SURGERY  2004   l4/5 fusion  . BREAST REDUCTION SURGERY Bilateral 08/08/2014   Procedure: BILATERAL BREAST REDUCTION  (BREAST);  Surgeon: Irene Limbo, MD;  Location: [facility];  Service: Plastics;  Laterality: Bilateral;  . CERVICAL DISCECTOMY  12/12/2018   3 level   . CHOLECYSTECTOMY     1998  . INJECTION KNEE Left   . KNEE ARTHROSCOPY    . LAPAROSCOPIC BILATERAL SALPINGO OOPHERECTOMY Bilateral 03/11/2015   Procedure: LAPAROSCOPIC BILATERAL SALPINGO OOPHORECTOMY;  Surgeon: Florian Buff, MD;  Location: [facility];  Service: Gynecology;  Laterality: Bilateral;  . LAPAROSCOPIC GASTRIC SLEEVE RESECTION WITH HIATAL HERNIA REPAIR N/A 03/22/2016   Procedure: LAPAROSCOPIC GASTRIC SLEEVE RESECTION WITH HIATAL HERNIA REPAIR WITHUPPER ENDOSCOPY;   Surgeon: Alphonsa Overall, MD;  Location: [facility];  Service: General;  Laterality: N/A;  . REDUCTION MAMMAPLASTY Bilateral   . SEPTOPLASTY  1988  . tonsillectomy  1991  . TONSILLECTOMY     Allergies  Allergen Reactions  . Zofran [Ondansetron Hcl] Shortness Of Breath    Numbness,  . Azithromycin Swelling  . Conjugated Estrogens Other (See Comments)    emotional  . Cyclobenzaprine Hcl     REACTION: disorientation  . Dicyclomine Hcl Other (See Comments)    unknown  . Norco [Hydrocodone-Acetaminophen] Other (See Comments)    Felt like she was going through withdrawal  . Nsaids Other (See Comments)    Cannot have because of bariatric surgery  . Oxycontin [Oxycodone]   . Percocet [Oxycodone-Acetaminophen]     Hypotension   . Robaxin [Methocarbamol]   . Rosuvastatin Dermatitis  . Wellbutrin [Bupropion Hcl] Other (See Comments)    Emotional, makes depression worse   No current facility-administered medications on file prior to encounter.   Current Outpatient Medications on File Prior to Encounter  Medication Sig Dispense Refill  . acetaminophen (TYLENOL) 500 MG tablet Take 1,000 mg by mouth every 6 (six) hours as needed. For pain     . atorvastatin (LIPITOR) 10 MG tablet Take 1 tablet (10 mg total) by mouth daily. 90 tablet 2  . citalopram (CELEXA) 20 MG tablet Take 1 tablet (20 mg total) by mouth 2 (  two) times a day. 180 tablet 3  . escitalopram (LEXAPRO) 5 MG tablet Take 1 tablet (5 mg total) by mouth at bedtime. 90 tablet 3  . famotidine (PEPCID) 10 MG tablet Take 10 mg by mouth 2 (two) times daily.    Marland Kitchen HYDROmorphone (DILAUDID) 4 MG tablet Take by mouth every 6 (six) hours as needed for severe pain.     . IBUPROFEN PO Take by mouth as needed.    . loratadine (CLARITIN) 10 MG tablet Take 10-20 mg by mouth daily as needed.     . methylPREDNISolone (MEDROL DOSEPAK) 4 MG TBPK tablet Take pills each morning with food for 6 days. For occipital neuralgia. 21 tablet 1  . orphenadrine  (NORFLEX) 100 MG tablet Take 1 tablet (100 mg total) by mouth 2 (two) times daily. 20 tablet 0  . pantoprazole (PROTONIX) 40 MG tablet Take 40 mg by mouth once. Every night    . polyvinyl alcohol-povidone (REFRESH) 1.4-0.6 % ophthalmic solution Place 1-2 drops into both eyes daily as needed (for dry eye relief).     Marland Kitchen tiZANidine (ZANAFLEX) 4 MG tablet Take 4 mg by mouth every 6 (six) hours as needed for muscle spasms.    . traZODone (DESYREL) 50 MG tablet Take 50 mg by mouth at bedtime.    . valACYclovir (VALTREX) 1000 MG tablet Take 1 tablet (1,000 mg total) by mouth 3 (three) times daily. 21 tablet 0   Social History   Socioeconomic History  . Marital status: Married    Spouse name: Not on file  . Number of children: 1  . Years of education: Not on file  . Highest education level: Not on file  Occupational History  . Occupation: stay at home mom  Tobacco Use  . Smoking status: Never Smoker  . Smokeless tobacco: Never Used  Substance and Sexual Activity  . Alcohol use: No  . Drug use: No  . Sexual activity: Not Currently    Partners: Male    Birth control/protection: Post-menopausal, Surgical    Comment: BSO 2016  Other Topics Concern  . Not on file  Social History Narrative   Lives at home with her husband   Left handed   Caffeine: 2-4 cups of coffee daily   Social Determinants of Health   Financial Resource Strain:   . Difficulty of Paying Living Expenses: Not on file  Food Insecurity:   . Worried About Charity fundraiser in the Last Year: Not on file  . Ran Out of Food in the Last Year: Not on file  Transportation Needs:   . Lack of Transportation (Medical): Not on file  . Lack of Transportation (Non-Medical): Not on file  Physical Activity:   . Days of Exercise per Week: Not on file  . Minutes of Exercise per Session: Not on file  Stress:   . Feeling of Stress : Not on file  Social Connections:   . Frequency of Communication with Friends and Family: Not on file   . Frequency of Social Gatherings with Friends and Family: Not on file  . Attends Religious Services: Not on file  . Active Member of Clubs or Organizations: Not on file  . Attends Archivist Meetings: Not on file  . Marital Status: Not on file  Intimate Partner Violence:   . Fear of Current or Ex-Partner: Not on file  . Emotionally Abused: Not on file  . Physically Abused: Not on file  . Sexually Abused: Not on file  Family History  Problem Relation Age of Onset  . COPD Mother   . Hypertension Mother   . GER disease Mother   . Heart attack Father   . Prostate cancer Father   . Hyperlipidemia Brother   . Hypertension Brother   . Diabetes Brother   . Neuropathy Brother   . Hyperlipidemia Brother   . Hypertension Brother   . Heart attack Paternal Grandmother   . Heart attack Paternal Grandfather   . Stroke Paternal Grandfather   . Cancer Paternal Uncle        prostate and bone  . Cancer Paternal Uncle        prostate and bone  . Colon cancer Neg Hx   . Stomach cancer Neg Hx     OBJECTIVE:  Vitals:   06/14/19 1137  BP: 110/72  Pulse: 83  Resp: 16  Temp: 98.7 F (37.1 C)  TempSrc: Oral  SpO2: 98%     General appearance: alert; appears mildly fatigued, but nontoxic; speaking in full sentences and tolerating own secretions HEENT: NCAT; Ears: EACs clear, TMs pearly gray; Eyes: PERRL.  EOM grossly intact. Nose: nares patent without rhinorrhea, Throat: oropharynx clear, tonsils non erythematous or enlarged, uvula midline  Neck: supple without LAD Lungs: unlabored respirations, symmetrical air entry; cough: absent; no respiratory distress; CTAB Heart: regular rate and rhythm.   Skin: warm and dry Psychological: alert and cooperative; normal mood and affect   LABS:   Results for orders placed or performed during the hospital encounter of 06/14/19 (from the past 24 hour(s))  POCT urinalysis dipstick     Status: Abnormal   Collection Time: 06/14/19 11:52 AM   Result Value Ref Range   Color, UA yellow yellow   Clarity, UA clear clear   Glucose, UA negative negative mg/dL   Bilirubin, UA negative negative   Ketones, POC UA negative negative mg/dL   Spec Grav, UA 1.025 1.010 - 1.025   Blood, UA negative negative   pH, UA 5.5 5.0 - 8.0   Protein Ur, POC negative negative mg/dL   Urobilinogen, UA 0.2 0.2 or 1.0 E.U./dL   Nitrite, UA Negative Negative   Leukocytes, UA Trace (A) Negative    ASSESSMENT & PLAN:  1. Suspected COVID-19 virus infection   2. Exposure to COVID-19 virus     Meds ordered this encounter  Medications  . cetirizine (ZYRTEC) 10 MG tablet    Sig: Take 1 tablet (10 mg total) by mouth daily.    Dispense:  30 tablet    Refill:  0    Order Specific Question:   Supervising Provider    Answer:   Raylene Everts [id number]  . fluticasone (FLONASE) 50 MCG/ACT nasal spray    Sig: Place 2 sprays into both nostrils daily.    Dispense:  16 g    Refill:  0    Order Specific Question:   Supervising Provider    Answer:   Raylene Everts [id number]  . benzonatate (TESSALON) 100 MG capsule    Sig: Take 1 capsule (100 mg total) by mouth every 8 (eight) hours.    Dispense:  21 capsule    Refill:  0    Order Specific Question:   Supervising Provider    Answer:   Raylene Everts [id number]  . pyridOXINE (VITAMIN B-6) 25 MG tablet    Sig: Take 1 tablet (25 mg total) by mouth daily.    Dispense:  10 tablet    Refill:  0    Order Specific Question:   Supervising Provider    Answer:   Raylene Everts [id number]   COVID testing ordered.  It will take between 2-5 days for test results.  Someone will contact you regarding abnormal results.    In the meantime: You should remain isolated in your home for 10 days from symptom onset AND greater than 72 hours after symptoms resolution (absence of fever without the use of fever-reducing medication and improvement in respiratory symptoms), whichever is longer Get plenty of rest and  push fluids Tessalon Perles prescribed for cough zyrtec for nasal congestion, runny nose, and/or sore throat  flonase for nasal congestion and runny nose Use medications daily for symptom relief Use OTC medications like ibuprofen or tylenol as needed fever or pain Call or go to the ED if you have any new or worsening symptoms such as fever, worsening cough, shortness of breath, chest tightness, chest pain, turning blue, changes in mental status, etc...   Urine rechecked with trace leukocytes.  Will culture and notify of abnormal results.  Macrobid sent to pharmacy.  AVS already printed.  Patient notified of results and antibiotic sent in.     Reviewed expectations re: course of current medical issues. Questions answered. Outlined signs and symptoms indicating need for more acute intervention. Patient verbalized understanding. After Visit Summary given.     Lestine Box, PA-C 06/14/19 1214

## 2019-06-14 NOTE — Telephone Encounter (Signed)
Pharmacy did not have prescription

## 2019-06-15 ENCOUNTER — Telehealth: Payer: Self-pay

## 2019-06-15 LAB — URINE CULTURE: Culture: NO GROWTH

## 2019-06-15 LAB — NOVEL CORONAVIRUS, NAA: SARS-CoV-2, NAA: NOT DETECTED

## 2019-06-15 NOTE — Telephone Encounter (Signed)
Received call from patient stating that she awoke with rib pain and SOB. Pt also states that nausea isn't improved.Provider made aware and suggested pt should follow up in ED, offered to prescribe something different for nausea per provider but pt declined.

## 2019-06-29 ENCOUNTER — Other Ambulatory Visit: Payer: Self-pay

## 2019-06-29 ENCOUNTER — Ambulatory Visit
Admission: EM | Admit: 2019-06-29 | Discharge: 2019-06-29 | Disposition: A | Discharge status: Home / Self Care | Payer: Medicare HMO | Attending: Emergency Medicine | Admitting: Emergency Medicine

## 2019-06-29 DIAGNOSIS — R1032 Left lower quadrant pain: Secondary | ICD-10-CM | POA: Diagnosis not present

## 2019-06-29 DIAGNOSIS — R110 Nausea: Secondary | ICD-10-CM

## 2019-06-29 DIAGNOSIS — K5732 Diverticulitis of large intestine without perforation or abscess without bleeding: Secondary | ICD-10-CM

## 2019-06-29 MED ORDER — PROMETHAZINE HCL 25 MG PO TABS: 25.0000 mg | ORAL_TABLET | Freq: Three times a day (TID) | ORAL | 0 refills | Status: DC | PRN

## 2019-06-29 MED ORDER — AMOXICILLIN-POT CLAVULANATE 875-125 MG PO TABS: 1.0000 | ORAL_TABLET | Freq: Three times a day (TID) | ORAL | 0 refills | Status: AC

## 2019-06-29 NOTE — ED Provider Notes (Signed)
[place]   [id number] 06/29/19 Arrival Time: 21  CC: ABDOMINAL DISCOMFORT  SUBJECTIVE:  KEYUNDRA VICKREY is a 54 y.o. female who presents with complaint of abdominal discomfort that began 3 weeks ago.  Denies a precipitating event, trauma, close contacts with similar symptoms, recent travel, changes in diet.  Localizes pain to LLQ.  Describes as worsening, intermittent and achy in character.  Has tried OTC medications TUMS and omeprazole without relief.  Denies alleviating or aggravating factors.  Reports similar symptoms in the past with diverticulitis flare.  Last BM this morning and less than normal.  Complains of fatigue, chills, and nausea.    Denies fever, vomiting, chest pain, SOB, diarrhea, constipation, hematochezia, melena, dysuria, difficulty urinating, increased frequency or urgency, flank pain, loss of bowel or bladder function, vaginal discharge, vaginal itching, vaginal odor, vaginal bleeding, pelvic pain.     Recently treated for yeast and UTI.  Report improvement/ resolution in symptoms.    Patient's last menstrual period was 02/10/2015.  ROS: As per HPI.  All other pertinent ROS negative.     Past Medical History:  Diagnosis Date  . Ankle fracture    left  . Anxiety   . Back pain   . Bell's palsy    Left side, 2009, 2017  . Bell's palsy   . Depression   . Diverticulitis   . GERD (gastroesophageal reflux disease)   . History of hiatal hernia   . Hyperlipidemia   . Hypertension   . Postoperative nausea 04/21/2016  . Toe fracture    left great toe  . Wears glasses    Past Surgical History:  Procedure Laterality Date  . BACK SURGERY  2004   l4/5 fusion  . BREAST REDUCTION SURGERY Bilateral 08/08/2014   Procedure: BILATERAL BREAST REDUCTION  (BREAST);  Surgeon: Irene Limbo, MD;  Location: [facility];  Service: Plastics;  Laterality: Bilateral;  . CERVICAL DISCECTOMY  12/12/2018   3 level   . CHOLECYSTECTOMY     1998  .  INJECTION KNEE Left   . KNEE ARTHROSCOPY    . LAPAROSCOPIC BILATERAL SALPINGO OOPHERECTOMY Bilateral 03/11/2015   Procedure: LAPAROSCOPIC BILATERAL SALPINGO OOPHORECTOMY;  Surgeon: Florian Buff, MD;  Location: [facility];  Service: Gynecology;  Laterality: Bilateral;  . LAPAROSCOPIC GASTRIC SLEEVE RESECTION WITH HIATAL HERNIA REPAIR N/A 03/22/2016   Procedure: LAPAROSCOPIC GASTRIC SLEEVE RESECTION WITH HIATAL HERNIA REPAIR WITHUPPER ENDOSCOPY;  Surgeon: Alphonsa Overall, MD;  Location: [facility];  Service: General;  Laterality: N/A;  . REDUCTION MAMMAPLASTY Bilateral   . SEPTOPLASTY  1988  . tonsillectomy  1991  . TONSILLECTOMY     Allergies  Allergen Reactions  . Zofran [Ondansetron Hcl] Shortness Of Breath    Numbness,  . Azithromycin Swelling  . Conjugated Estrogens Other (See Comments)    emotional  . Cyclobenzaprine Hcl     REACTION: disorientation  . Dicyclomine Hcl Other (See Comments)    unknown  . Norco [Hydrocodone-Acetaminophen] Other (See Comments)    Felt like she was going through withdrawal  . Nsaids Other (See Comments)    Cannot have because of bariatric surgery  . Oxycontin [Oxycodone]   . Percocet [Oxycodone-Acetaminophen]     Hypotension   . Robaxin [Methocarbamol]   . Rosuvastatin Dermatitis  . Wellbutrin [Bupropion Hcl] Other (See Comments)    Emotional, makes depression worse   No current facility-administered medications on file prior to encounter.   Current Outpatient Medications on File Prior to Encounter  Medication Sig  Dispense Refill  . acetaminophen (TYLENOL) 500 MG tablet Take 1,000 mg by mouth every 6 (six) hours as needed. For pain     . atorvastatin (LIPITOR) 10 MG tablet Take 1 tablet (10 mg total) by mouth daily. 90 tablet 2  . cetirizine (ZYRTEC) 10 MG tablet Take 1 tablet (10 mg total) by mouth daily. 30 tablet 0  . citalopram (CELEXA) 20 MG tablet Take 1 tablet (20 mg total) by mouth 2 (two) times a day. 180 tablet 3  . escitalopram (LEXAPRO) 5  MG tablet Take 1 tablet (5 mg total) by mouth at bedtime. 90 tablet 3  . famotidine (PEPCID) 10 MG tablet Take 10 mg by mouth 2 (two) times daily.    . fluticasone (FLONASE) 50 MCG/ACT nasal spray Place 2 sprays into both nostrils daily. 16 g 0  . HYDROmorphone (DILAUDID) 4 MG tablet Take by mouth every 6 (six) hours as needed for severe pain.     . IBUPROFEN PO Take by mouth as needed.    . loratadine (CLARITIN) 10 MG tablet Take 10-20 mg by mouth daily as needed.     . methylPREDNISolone (MEDROL DOSEPAK) 4 MG TBPK tablet Take pills each morning with food for 6 days. For occipital neuralgia. 21 tablet 1  . orphenadrine (NORFLEX) 100 MG tablet Take 1 tablet (100 mg total) by mouth 2 (two) times daily. 20 tablet 0  . pantoprazole (PROTONIX) 40 MG tablet Take 40 mg by mouth once. Every night    . polyvinyl alcohol-povidone (REFRESH) 1.4-0.6 % ophthalmic solution Place 1-2 drops into both eyes daily as needed (for dry eye relief).     Marland Kitchen tiZANidine (ZANAFLEX) 4 MG tablet Take 4 mg by mouth every 6 (six) hours as needed for muscle spasms.    . traZODone (DESYREL) 50 MG tablet Take 50 mg by mouth at bedtime.    . valACYclovir (VALTREX) 1000 MG tablet Take 1 tablet (1,000 mg total) by mouth 3 (three) times daily. 21 tablet 0   Social History   Socioeconomic History  . Marital status: Married    Spouse name: Not on file  . Number of children: 1  . Years of education: Not on file  . Highest education level: Not on file  Occupational History  . Occupation: stay at home mom  Tobacco Use  . Smoking status: Never Smoker  . Smokeless tobacco: Never Used  Substance and Sexual Activity  . Alcohol use: No  . Drug use: No  . Sexual activity: Not Currently    Partners: Male    Birth control/protection: Post-menopausal, Surgical    Comment: BSO 2016  Other Topics Concern  . Not on file  Social History Narrative   Lives at home with her husband   Left handed   Caffeine: 2-4 cups of coffee daily    Social Determinants of Health   Financial Resource Strain:   . Difficulty of Paying Living Expenses: Not on file  Food Insecurity:   . Worried About Charity fundraiser in the Last Year: Not on file  . Ran Out of Food in the Last Year: Not on file  Transportation Needs:   . Lack of Transportation (Medical): Not on file  . Lack of Transportation (Non-Medical): Not on file  Physical Activity:   . Days of Exercise per Week: Not on file  . Minutes of Exercise per Session: Not on file  Stress:   . Feeling of Stress : Not on file  Social Connections:   .  Frequency of Communication with Friends and Family: Not on file  . Frequency of Social Gatherings with Friends and Family: Not on file  . Attends Religious Services: Not on file  . Active Member of Clubs or Organizations: Not on file  . Attends Archivist Meetings: Not on file  . Marital Status: Not on file  Intimate Partner Violence:   . Fear of Current or Ex-Partner: Not on file  . Emotionally Abused: Not on file  . Physically Abused: Not on file  . Sexually Abused: Not on file   Family History  Problem Relation Age of Onset  . COPD Mother   . Hypertension Mother   . GER disease Mother   . Heart attack Father   . Prostate cancer Father   . Hyperlipidemia Brother   . Hypertension Brother   . Diabetes Brother   . Neuropathy Brother   . Hyperlipidemia Brother   . Hypertension Brother   . Heart attack Paternal Grandmother   . Heart attack Paternal Grandfather   . Stroke Paternal Grandfather   . Cancer Paternal Uncle        prostate and bone  . Cancer Paternal Uncle        prostate and bone  . Colon cancer Neg Hx   . Stomach cancer Neg Hx      OBJECTIVE:  Vitals:   06/29/19 1359  BP: 117/76  Pulse: 68  Resp: 20  Temp: 98.3 F (36.8 C)  SpO2: 97%    General appearance: Alert; NAD HEENT: NCAT.  PERRL, EOMI grossly; Oropharynx clear.  Lungs: clear to auscultation bilaterally without adventitious  breath sounds Heart: regular rate and rhythm.   Abdomen: soft, non-distended; normal active bowel sounds; TTP over LLQ, + guarding Back: no CVA tenderness Extremities: no edema; symmetrical with no gross deformities Skin: warm and dry Neurologic: normal gait Psychological: alert and cooperative; normal mood and affect   ASSESSMENT & PLAN:  1. LLQ pain   2. Diverticulitis of colon   3. Nausea without vomiting     Meds ordered this encounter  Medications  . amoxicillin-clavulanate (AUGMENTIN) 875-125 MG tablet    Sig: Take 1 tablet by mouth every 8 (eight) hours for 10 days.    Dispense:  30 tablet    Refill:  0    Order Specific Question:   Supervising Provider    Answer:   Raylene Everts [id number]  . promethazine (PHENERGAN) 25 MG tablet    Sig: Take 1 tablet (25 mg total) by mouth every 8 (eight) hours as needed for nausea or vomiting.    Dispense:  20 tablet    Refill:  0    Order Specific Question:   Supervising Provider    Answer:   Raylene Everts [id number]   Get rest and drink fluids Will treat for diverticulitis flare Augmentin prescribed.  Take as directed and to completion Promethazine prescribed for nausea Follow up with PCP for recheck this week If you experience new or worsening symptoms return or go to ER such as fever, chills, nausea, vomiting, diarrhea, bloody or dark tarry stools, constipation, urinary symptoms, worsening abdominal discomfort, symptoms that do not improve with medications, inability to keep fluids down, etc...  Reviewed expectations re: course of current medical issues. Questions answered. Outlined signs and symptoms indicating need for more acute intervention. Patient verbalized understanding. After Visit Summary given.   Lestine Box, PA-C 06/29/19 1435

## 2019-06-29 NOTE — ED Triage Notes (Signed)
Pt presents with c/o left lower quadrant pain that has been ongoing for past month, pt also reports nausea

## 2019-06-29 NOTE — Discharge Instructions (Signed)
Get rest and drink fluids Will treat for diverticulitis flare Augmentin prescribed.  Take as directed and to completion Promethazine prescribed for nausea Follow up with PCP for recheck this week If you experience new or worsening symptoms return or go to ER such as fever, chills, nausea, vomiting, diarrhea, bloody or dark tarry stools, constipation, urinary symptoms, worsening abdominal discomfort, symptoms that do not improve with medications, inability to keep fluids down, etc..Marland Kitchen

## 2019-07-18 DIAGNOSIS — F419 Anxiety disorder, unspecified: Secondary | ICD-10-CM | POA: Diagnosis not present

## 2019-07-23 DIAGNOSIS — J019 Acute sinusitis, unspecified: Secondary | ICD-10-CM | POA: Diagnosis not present

## 2019-07-23 DIAGNOSIS — F419 Anxiety disorder, unspecified: Secondary | ICD-10-CM | POA: Diagnosis not present

## 2019-07-23 DIAGNOSIS — E785 Hyperlipidemia, unspecified: Secondary | ICD-10-CM | POA: Diagnosis not present

## 2019-07-23 DIAGNOSIS — K5900 Constipation, unspecified: Secondary | ICD-10-CM | POA: Diagnosis not present

## 2019-07-23 DIAGNOSIS — E782 Mixed hyperlipidemia: Secondary | ICD-10-CM | POA: Diagnosis not present

## 2019-07-23 DIAGNOSIS — F410 Panic disorder [episodic paroxysmal anxiety] without agoraphobia: Secondary | ICD-10-CM | POA: Diagnosis not present

## 2019-07-23 DIAGNOSIS — F411 Generalized anxiety disorder: Secondary | ICD-10-CM | POA: Diagnosis not present

## 2019-07-23 DIAGNOSIS — K219 Gastro-esophageal reflux disease without esophagitis: Secondary | ICD-10-CM | POA: Diagnosis not present

## 2019-07-23 DIAGNOSIS — G510 Bell's palsy: Secondary | ICD-10-CM | POA: Diagnosis not present

## 2019-07-29 DIAGNOSIS — F419 Anxiety disorder, unspecified: Secondary | ICD-10-CM | POA: Diagnosis not present

## 2019-07-29 DIAGNOSIS — E782 Mixed hyperlipidemia: Secondary | ICD-10-CM | POA: Diagnosis not present

## 2019-07-29 DIAGNOSIS — F410 Panic disorder [episodic paroxysmal anxiety] without agoraphobia: Secondary | ICD-10-CM | POA: Diagnosis not present

## 2019-07-29 DIAGNOSIS — M1712 Unilateral primary osteoarthritis, left knee: Secondary | ICD-10-CM | POA: Diagnosis not present

## 2019-07-29 DIAGNOSIS — K219 Gastro-esophageal reflux disease without esophagitis: Secondary | ICD-10-CM | POA: Diagnosis not present

## 2019-07-29 DIAGNOSIS — G510 Bell's palsy: Secondary | ICD-10-CM | POA: Diagnosis not present

## 2019-07-29 DIAGNOSIS — F411 Generalized anxiety disorder: Secondary | ICD-10-CM | POA: Diagnosis not present

## 2019-07-29 DIAGNOSIS — K5900 Constipation, unspecified: Secondary | ICD-10-CM | POA: Diagnosis not present

## 2019-07-31 DIAGNOSIS — H524 Presbyopia: Secondary | ICD-10-CM | POA: Diagnosis not present

## 2019-07-31 DIAGNOSIS — H52223 Regular astigmatism, bilateral: Secondary | ICD-10-CM | POA: Diagnosis not present

## 2019-07-31 DIAGNOSIS — H2513 Age-related nuclear cataract, bilateral: Secondary | ICD-10-CM | POA: Diagnosis not present

## 2019-07-31 DIAGNOSIS — H5203 Hypermetropia, bilateral: Secondary | ICD-10-CM | POA: Diagnosis not present

## 2019-08-06 DIAGNOSIS — Z01818 Encounter for other preprocedural examination: Secondary | ICD-10-CM | POA: Diagnosis not present

## 2019-08-06 DIAGNOSIS — H2513 Age-related nuclear cataract, bilateral: Secondary | ICD-10-CM | POA: Diagnosis not present

## 2019-08-06 DIAGNOSIS — H2510 Age-related nuclear cataract, unspecified eye: Secondary | ICD-10-CM | POA: Diagnosis not present

## 2019-08-06 DIAGNOSIS — H2512 Age-related nuclear cataract, left eye: Secondary | ICD-10-CM | POA: Diagnosis not present

## 2019-08-06 DIAGNOSIS — H02889 Meibomian gland dysfunction of unspecified eye, unspecified eyelid: Secondary | ICD-10-CM | POA: Diagnosis not present

## 2019-08-21 DIAGNOSIS — H2512 Age-related nuclear cataract, left eye: Secondary | ICD-10-CM | POA: Diagnosis not present

## 2019-08-21 DIAGNOSIS — H25812 Combined forms of age-related cataract, left eye: Secondary | ICD-10-CM | POA: Diagnosis not present

## 2019-08-28 DIAGNOSIS — H25811 Combined forms of age-related cataract, right eye: Secondary | ICD-10-CM | POA: Diagnosis not present

## 2019-08-28 DIAGNOSIS — H2511 Age-related nuclear cataract, right eye: Secondary | ICD-10-CM | POA: Diagnosis not present

## 2019-09-03 DIAGNOSIS — M542 Cervicalgia: Secondary | ICD-10-CM | POA: Diagnosis not present

## 2019-09-03 DIAGNOSIS — M5440 Lumbago with sciatica, unspecified side: Secondary | ICD-10-CM | POA: Diagnosis not present

## 2019-09-12 DIAGNOSIS — H2511 Age-related nuclear cataract, right eye: Secondary | ICD-10-CM | POA: Diagnosis not present

## 2019-10-02 DIAGNOSIS — Z0100 Encounter for examination of eyes and vision without abnormal findings: Secondary | ICD-10-CM | POA: Diagnosis not present

## 2019-10-15 DIAGNOSIS — K5900 Constipation, unspecified: Secondary | ICD-10-CM | POA: Diagnosis not present

## 2019-10-15 DIAGNOSIS — R10817 Generalized abdominal tenderness: Secondary | ICD-10-CM | POA: Diagnosis not present

## 2019-10-17 ENCOUNTER — Encounter (HOSPITAL_COMMUNITY): Payer: Self-pay

## 2019-10-23 DIAGNOSIS — R1030 Lower abdominal pain, unspecified: Secondary | ICD-10-CM | POA: Diagnosis not present

## 2019-11-08 ENCOUNTER — Other Ambulatory Visit (HOSPITAL_COMMUNITY): Payer: Self-pay | Admitting: Internal Medicine

## 2019-11-08 ENCOUNTER — Other Ambulatory Visit: Payer: Self-pay | Admitting: Internal Medicine

## 2019-11-08 DIAGNOSIS — R1030 Lower abdominal pain, unspecified: Secondary | ICD-10-CM

## 2019-12-18 DIAGNOSIS — J069 Acute upper respiratory infection, unspecified: Secondary | ICD-10-CM | POA: Diagnosis not present

## 2019-12-18 DIAGNOSIS — Z1152 Encounter for screening for COVID-19: Secondary | ICD-10-CM | POA: Diagnosis not present

## 2019-12-19 ENCOUNTER — Other Ambulatory Visit: Payer: Medicare HMO

## 2019-12-19 ENCOUNTER — Other Ambulatory Visit: Payer: Self-pay

## 2019-12-19 DIAGNOSIS — Z20822 Contact with and (suspected) exposure to covid-19: Secondary | ICD-10-CM | POA: Diagnosis not present

## 2019-12-20 LAB — NOVEL CORONAVIRUS, NAA: SARS-CoV-2, NAA: NOT DETECTED

## 2019-12-20 LAB — SARS-COV-2, NAA 2 DAY TAT

## 2020-01-15 DIAGNOSIS — R1030 Lower abdominal pain, unspecified: Secondary | ICD-10-CM | POA: Diagnosis not present

## 2020-01-15 DIAGNOSIS — F411 Generalized anxiety disorder: Secondary | ICD-10-CM | POA: Diagnosis not present

## 2020-01-15 DIAGNOSIS — K219 Gastro-esophageal reflux disease without esophagitis: Secondary | ICD-10-CM | POA: Diagnosis not present

## 2020-01-15 DIAGNOSIS — K5900 Constipation, unspecified: Secondary | ICD-10-CM | POA: Diagnosis not present

## 2020-01-15 DIAGNOSIS — E782 Mixed hyperlipidemia: Secondary | ICD-10-CM | POA: Diagnosis not present

## 2020-01-15 DIAGNOSIS — R102 Pelvic and perineal pain: Secondary | ICD-10-CM | POA: Diagnosis not present

## 2020-01-15 DIAGNOSIS — Z712 Person consulting for explanation of examination or test findings: Secondary | ICD-10-CM | POA: Diagnosis not present

## 2020-01-15 DIAGNOSIS — F410 Panic disorder [episodic paroxysmal anxiety] without agoraphobia: Secondary | ICD-10-CM | POA: Diagnosis not present

## 2020-01-20 DIAGNOSIS — F419 Anxiety disorder, unspecified: Secondary | ICD-10-CM | POA: Diagnosis not present

## 2020-01-20 DIAGNOSIS — M1712 Unilateral primary osteoarthritis, left knee: Secondary | ICD-10-CM | POA: Diagnosis not present

## 2020-01-20 DIAGNOSIS — E782 Mixed hyperlipidemia: Secondary | ICD-10-CM | POA: Diagnosis not present

## 2020-01-20 DIAGNOSIS — K5900 Constipation, unspecified: Secondary | ICD-10-CM | POA: Diagnosis not present

## 2020-01-20 DIAGNOSIS — G510 Bell's palsy: Secondary | ICD-10-CM | POA: Diagnosis not present

## 2020-01-20 DIAGNOSIS — F332 Major depressive disorder, recurrent severe without psychotic features: Secondary | ICD-10-CM | POA: Diagnosis not present

## 2020-01-20 DIAGNOSIS — Z6836 Body mass index (BMI) 36.0-36.9, adult: Secondary | ICD-10-CM | POA: Diagnosis not present

## 2020-01-20 DIAGNOSIS — K219 Gastro-esophageal reflux disease without esophagitis: Secondary | ICD-10-CM | POA: Diagnosis not present

## 2020-01-20 DIAGNOSIS — F411 Generalized anxiety disorder: Secondary | ICD-10-CM | POA: Diagnosis not present

## 2020-01-20 DIAGNOSIS — Z23 Encounter for immunization: Secondary | ICD-10-CM | POA: Diagnosis not present

## 2020-02-10 ENCOUNTER — Other Ambulatory Visit (HOSPITAL_COMMUNITY): Payer: Self-pay | Admitting: Internal Medicine

## 2020-02-10 DIAGNOSIS — Z1231 Encounter for screening mammogram for malignant neoplasm of breast: Secondary | ICD-10-CM

## 2020-02-11 DIAGNOSIS — K5900 Constipation, unspecified: Secondary | ICD-10-CM | POA: Diagnosis not present

## 2020-02-11 DIAGNOSIS — G510 Bell's palsy: Secondary | ICD-10-CM | POA: Diagnosis not present

## 2020-02-11 DIAGNOSIS — Z6836 Body mass index (BMI) 36.0-36.9, adult: Secondary | ICD-10-CM | POA: Diagnosis not present

## 2020-02-11 DIAGNOSIS — F419 Anxiety disorder, unspecified: Secondary | ICD-10-CM | POA: Diagnosis not present

## 2020-02-11 DIAGNOSIS — M542 Cervicalgia: Secondary | ICD-10-CM | POA: Diagnosis not present

## 2020-02-11 DIAGNOSIS — K219 Gastro-esophageal reflux disease without esophagitis: Secondary | ICD-10-CM | POA: Diagnosis not present

## 2020-02-11 DIAGNOSIS — E782 Mixed hyperlipidemia: Secondary | ICD-10-CM | POA: Diagnosis not present

## 2020-02-11 DIAGNOSIS — M1712 Unilateral primary osteoarthritis, left knee: Secondary | ICD-10-CM | POA: Diagnosis not present

## 2020-02-16 ENCOUNTER — Other Ambulatory Visit: Payer: Self-pay

## 2020-02-16 ENCOUNTER — Ambulatory Visit
Admission: EM | Admit: 2020-02-16 | Discharge: 2020-02-16 | Disposition: A | Discharge status: Home / Self Care | Payer: Medicare HMO | Attending: Emergency Medicine | Admitting: Emergency Medicine

## 2020-02-16 ENCOUNTER — Encounter: Payer: Self-pay | Admitting: Emergency Medicine

## 2020-02-16 DIAGNOSIS — R110 Nausea: Secondary | ICD-10-CM | POA: Diagnosis not present

## 2020-02-16 DIAGNOSIS — R8290 Unspecified abnormal findings in urine: Secondary | ICD-10-CM | POA: Insufficient documentation

## 2020-02-16 DIAGNOSIS — R109 Unspecified abdominal pain: Secondary | ICD-10-CM | POA: Diagnosis not present

## 2020-02-16 LAB — POCT URINALYSIS DIP (MANUAL ENTRY)
Bilirubin, UA: NEGATIVE
Blood, UA: NEGATIVE
Glucose, UA: NEGATIVE mg/dL
Ketones, POC UA: NEGATIVE mg/dL
Nitrite, UA: NEGATIVE
Protein Ur, POC: NEGATIVE mg/dL
Spec Grav, UA: 1.02 (ref 1.010–1.025)
Urobilinogen, UA: 0.2 E.U./dL
pH, UA: 8.5 — AB (ref 5.0–8.0)

## 2020-02-16 MED ORDER — NITROFURANTOIN MONOHYD MACRO 100 MG PO CAPS: 100.0000 mg | ORAL_CAPSULE | Freq: Two times a day (BID) | ORAL | 0 refills | Status: DC

## 2020-02-16 NOTE — ED Provider Notes (Addendum)
[facility]   CC: Burning with urination  SUBJECTIVE:  Cassandra Mcconnell is a 54 y.o. female who presented to the urgent care with a complaint of foul-smelling urine, nausea, lower abdomen and flank pain for the past 1 month.  Patient denies a precipitating event, recent sexual encounter, excessive caffeine intake.  Localizes the pain to the lower abdomen/flank.  Pain is intermittent described as achy in character.  Has tried OTC medications without relief.  Symptoms are made worse with urination.  Admits to similar symptoms in the past.  Denies fever, chills, nausea, vomiting, abdominal pain, flank pain, abnormal vaginal discharge or bleeding, hematuria.    LMP: Patient's last menstrual period was 02/10/2015.  ROS: As in HPI.  All other pertinent ROS negative.     Past Medical History:  Diagnosis Date  . Ankle fracture    left  . Anxiety   . Back pain   . Bell's palsy    Left side, 2009, 2017  . Bell's palsy   . Depression   . Diverticulitis   . GERD (gastroesophageal reflux disease)   . History of hiatal hernia   . Hyperlipidemia   . Hypertension   . Postoperative nausea 04/21/2016  . Toe fracture    left great toe  . Wears glasses    Past Surgical History:  Procedure Laterality Date  . BACK SURGERY  2004   l4/5 fusion  . BREAST REDUCTION SURGERY Bilateral 08/08/2014   Procedure: BILATERAL BREAST REDUCTION  (BREAST);  Surgeon: Irene Limbo, MD;  Location: [facility];  Service: Plastics;  Laterality: Bilateral;  . CERVICAL DISCECTOMY  12/12/2018   3 level   . CHOLECYSTECTOMY     1998  . INJECTION KNEE Left   . KNEE ARTHROSCOPY    . LAPAROSCOPIC BILATERAL SALPINGO OOPHERECTOMY Bilateral 03/11/2015   Procedure: LAPAROSCOPIC BILATERAL SALPINGO OOPHORECTOMY;  Surgeon: Florian Buff, MD;  Location: [facility];  Service: Gynecology;  Laterality: Bilateral;  . LAPAROSCOPIC GASTRIC SLEEVE RESECTION WITH HIATAL HERNIA REPAIR N/A 03/22/2016   Procedure:  LAPAROSCOPIC GASTRIC SLEEVE RESECTION WITH HIATAL HERNIA REPAIR WITHUPPER ENDOSCOPY;  Surgeon: Alphonsa Overall, MD;  Location: [facility];  Service: General;  Laterality: N/A;  . REDUCTION MAMMAPLASTY Bilateral   . SEPTOPLASTY  1988  . tonsillectomy  1991  . TONSILLECTOMY     Allergies  Allergen Reactions  . Zofran [Ondansetron Hcl] Shortness Of Breath    Numbness,  . Azithromycin Swelling  . Conjugated Estrogens Other (See Comments)    emotional  . Cyclobenzaprine Hcl     REACTION: disorientation  . Dicyclomine Hcl Other (See Comments)    unknown  . Norco [Hydrocodone-Acetaminophen] Other (See Comments)    Felt like she was going through withdrawal  . Nsaids Other (See Comments)    Cannot have because of bariatric surgery  . Oxycontin [Oxycodone]   . Percocet [Oxycodone-Acetaminophen]     Hypotension   . Robaxin [Methocarbamol]   . Rosuvastatin Dermatitis  . Wellbutrin [Bupropion Hcl] Other (See Comments)    Emotional, makes depression worse   No current facility-administered medications on file prior to encounter.   Current Outpatient Medications on File Prior to Encounter  Medication Sig Dispense Refill  . acetaminophen (TYLENOL) 500 MG tablet Take 1,000 mg by mouth every 6 (six) hours as needed. For pain     . atorvastatin (LIPITOR) 10 MG tablet Take 1 tablet (10 mg total) by mouth daily. 90 tablet 2  . cetirizine (ZYRTEC) 10 MG tablet Take  1 tablet (10 mg total) by mouth daily. 30 tablet 0  . citalopram (CELEXA) 20 MG tablet Take 1 tablet (20 mg total) by mouth 2 (two) times a day. 180 tablet 3  . escitalopram (LEXAPRO) 5 MG tablet Take 1 tablet (5 mg total) by mouth at bedtime. 90 tablet 3  . famotidine (PEPCID) 10 MG tablet Take 10 mg by mouth 2 (two) times daily.    . fluticasone (FLONASE) 50 MCG/ACT nasal spray Place 2 sprays into both nostrils daily. 16 g 0  . HYDROmorphone (DILAUDID) 4 MG tablet Take by mouth every 6 (six) hours as needed for severe pain.     . IBUPROFEN  PO Take by mouth as needed.    . loratadine (CLARITIN) 10 MG tablet Take 10-20 mg by mouth daily as needed.     . methylPREDNISolone (MEDROL DOSEPAK) 4 MG TBPK tablet Take pills each morning with food for 6 days. For occipital neuralgia. 21 tablet 1  . orphenadrine (NORFLEX) 100 MG tablet Take 1 tablet (100 mg total) by mouth 2 (two) times daily. 20 tablet 0  . pantoprazole (PROTONIX) 40 MG tablet Take 40 mg by mouth once. Every night    . polyvinyl alcohol-povidone (REFRESH) 1.4-0.6 % ophthalmic solution Place 1-2 drops into both eyes daily as needed (for dry eye relief).     . promethazine (PHENERGAN) 25 MG tablet Take 1 tablet (25 mg total) by mouth every 8 (eight) hours as needed for nausea or vomiting. 20 tablet 0  . tiZANidine (ZANAFLEX) 4 MG tablet Take 4 mg by mouth every 6 (six) hours as needed for muscle spasms.    . traZODone (DESYREL) 50 MG tablet Take 50 mg by mouth at bedtime.    . valACYclovir (VALTREX) 1000 MG tablet Take 1 tablet (1,000 mg total) by mouth 3 (three) times daily. 21 tablet 0   Social History   Socioeconomic History  . Marital status: Married    Spouse name: Not on file  . Number of children: 1  . Years of education: Not on file  . Highest education level: Not on file  Occupational History  . Occupation: stay at home mom  Tobacco Use  . Smoking status: Never Smoker  . Smokeless tobacco: Never Used  Vaping Use  . Vaping Use: Never used  Substance and Sexual Activity  . Alcohol use: No  . Drug use: No  . Sexual activity: Not Currently    Partners: Male    Birth control/protection: Post-menopausal, Surgical    Comment: BSO 2016  Other Topics Concern  . Not on file  Social History Narrative   Lives at home with her husband   Left handed   Caffeine: 2-4 cups of coffee daily   Social Determinants of Health   Financial Resource Strain:   . Difficulty of Paying Living Expenses: Not on file  Food Insecurity:   . Worried About Charity fundraiser in  the Last Year: Not on file  . Ran Out of Food in the Last Year: Not on file  Transportation Needs:   . Lack of Transportation (Medical): Not on file  . Lack of Transportation (Non-Medical): Not on file  Physical Activity:   . Days of Exercise per Week: Not on file  . Minutes of Exercise per Session: Not on file  Stress:   . Feeling of Stress : Not on file  Social Connections:   . Frequency of Communication with Friends and Family: Not on file  . Frequency  of Social Gatherings with Friends and Family: Not on file  . Attends Religious Services: Not on file  . Active Member of Clubs or Organizations: Not on file  . Attends Archivist Meetings: Not on file  . Marital Status: Not on file  Intimate Partner Violence:   . Fear of Current or Ex-Partner: Not on file  . Emotionally Abused: Not on file  . Physically Abused: Not on file  . Sexually Abused: Not on file   Family History  Problem Relation Age of Onset  . COPD Mother   . Hypertension Mother   . GER disease Mother   . Heart attack Father   . Prostate cancer Father   . Hyperlipidemia Brother   . Hypertension Brother   . Diabetes Brother   . Neuropathy Brother   . Hyperlipidemia Brother   . Hypertension Brother   . Heart attack Paternal Grandmother   . Heart attack Paternal Grandfather   . Stroke Paternal Grandfather   . Cancer Paternal Uncle        prostate and bone  . Cancer Paternal Uncle        prostate and bone  . Colon cancer Neg Hx   . Stomach cancer Neg Hx     OBJECTIVE:  Vitals:   02/16/20 1125 02/16/20 1129  BP:  109/67  Pulse:  67  Resp:  19  Temp:  98.7 F (37.1 C)  TempSrc:  Oral  SpO2:  97%  Weight: 204 lb (92.5 kg)   Height: 5\' 4"  (1.626 m)    General appearance: AOx3 in no acute distress HEENT: NCAT.  Oropharynx clear.  Lungs: clear to auscultation bilaterally without adventitious breath sounds Heart: regular rate and rhythm.  Radial pulses 2+ symmetrical bilaterally Abdomen:  soft; non-distended; no tenderness; bowel sounds present; no guarding or rebound tenderness Back: no CVA tenderness Extremities: no edema; symmetrical with no gross deformities Skin: warm and dry Neurologic: Ambulates from chair to exam table without difficulty Psychological: alert and cooperative; normal mood and affect  Labs Reviewed  POCT URINALYSIS DIP (MANUAL ENTRY) - Abnormal; Notable for the following components:      Result Value   pH, UA 8.5 (*)    Leukocytes, UA Trace (*)    All other components within normal limits  URINE CULTURE  CERVICOVAGINAL ANCILLARY ONLY    ASSESSMENT & PLAN:  1. Foul smelling urine   2. Flank pain     Meds ordered this encounter  Medications  . nitrofurantoin, macrocrystal-monohydrate, (MACROBID) 100 MG capsule    Sig: Take 1 capsule (100 mg total) by mouth 2 (two) times daily.    Dispense:  10 capsule    Refill:  0   Urine culture was sent, cervical ancillary for BV and yeast infection were collected.  Will prescribe Macrobid and will await lab results.  Discharge instructions  Urine culture sent.  We will call you with the results.   Push fluids and get plenty of rest.   Take antibiotic as directed and to completion Continue to take phenergan as prescribed Follow up with PCP if symptoms persists Return here or go to ER if you have any new or worsening symptoms such as fever, worsening abdominal pain, nausea/vomiting, flank pain, etc...  Outlined signs and symptoms indicating need for more acute intervention. Patient verbalized understanding. After Visit Summary given.     Emerson Monte, FNP 02/16/20 1152    Emerson Monte, FNP 02/16/20 1157

## 2020-02-16 NOTE — Discharge Instructions (Addendum)
Urine culture sent.  We will call you with the results.   Push fluids and get plenty of rest.   Continue to take Reglan for nausea Take antibiotic as directed and to completion Follow up with PCP if symptoms persists Return here or go to ER if you have any new or worsening symptoms such as fever, worsening abdominal pain, nausea/vomiting, flank pain, etc..Marland Kitchen

## 2020-02-16 NOTE — ED Triage Notes (Signed)
Pt reports urine smells like ammonia, lower abd and lower back pain x 1 month.  Also reports some nausea

## 2020-02-17 LAB — URINE CULTURE: Culture: <10000 — AB

## 2020-02-18 LAB — CERVICOVAGINAL ANCILLARY ONLY
Bacterial Vaginitis (gardnerella): NEGATIVE
Candida Glabrata: NEGATIVE
Candida Vaginitis: NEGATIVE
Comment: NEGATIVE
Comment: NEGATIVE
Comment: NEGATIVE

## 2020-03-02 ENCOUNTER — Ambulatory Visit (HOSPITAL_COMMUNITY): Payer: Medicare HMO

## 2020-03-25 DIAGNOSIS — B372 Candidiasis of skin and nail: Secondary | ICD-10-CM | POA: Diagnosis not present

## 2020-03-25 DIAGNOSIS — M25531 Pain in right wrist: Secondary | ICD-10-CM | POA: Diagnosis not present

## 2020-04-01 ENCOUNTER — Ambulatory Visit (INDEPENDENT_AMBULATORY_CARE_PROVIDER_SITE_OTHER): Payer: Medicare HMO

## 2020-04-01 ENCOUNTER — Other Ambulatory Visit: Payer: Self-pay

## 2020-04-01 ENCOUNTER — Ambulatory Visit: Payer: Medicare HMO | Admitting: Orthopaedic Surgery

## 2020-04-01 ENCOUNTER — Encounter: Payer: Self-pay | Admitting: Orthopaedic Surgery

## 2020-04-01 VITALS — Ht 64.0 in | Wt 203.0 lb

## 2020-04-01 DIAGNOSIS — M79671 Pain in right foot: Secondary | ICD-10-CM

## 2020-04-01 DIAGNOSIS — M79674 Pain in right toe(s): Secondary | ICD-10-CM | POA: Diagnosis not present

## 2020-04-01 DIAGNOSIS — M545 Low back pain, unspecified: Secondary | ICD-10-CM

## 2020-04-01 MED ORDER — METHYLPREDNISOLONE 4 MG PO TBPK: ORAL_TABLET | ORAL | 0 refills | Status: DC

## 2020-04-01 NOTE — Progress Notes (Signed)
Office Visit Note   Patient: Cassandra Mcconnell           Date of Birth: 1966/02/22           MRN: [id number] Visit Date: 04/01/2020              Requested by: Ronnald Nian, DO [place],  [place] [address] PCP: Ronnald Nian, DO   Assessment & Plan: Visit Diagnoses:  1. Pain in right foot   2. Acute midline low back pain, unspecified whether sciatica present   3. Acute bilateral low back pain without sciatica   4. Toe pain, right     Plan: Acute onset of low back pain within the last 24 hours without injury or trauma.  No evidence of neurologic involvement as the pain is localized to her low back and right buttock.  We will try a Medrol Dosepak.  Has muscle relaxants at home.  Call next week if no improvement.  Right small toe pain from an injury 2 weeks ago.  No obvious changes on x-ray.  Will wear comfortable shoes.  I suspect this will resolve on its own  Follow-Up Instructions: Return if symptoms worsen or fail to improve.   Orders:  Orders Placed This Encounter  Procedures  . XR Lumbar Spine 2-3 Views  . XR Foot Complete Right   Meds ordered this encounter  Medications  . methylPREDNISolone (MEDROL DOSEPAK) 4 MG TBPK tablet    Sig: Take as directed on package.    Dispense:  21 tablet    Refill:  0      Procedures: No procedures performed   Clinical Data: No additional findings.   Subjective: Chief Complaint  Patient presents with  . Right Foot - Pain  . Lower Back - Pain  Patient presents today for lower right sided back pain. She said that it has been hurting for 8 days. No known injury. She has a history of two spine surgeries with Dr.Cram in 2004 and 2020.  Low back surgery to fuse L5-S1 was performed in 2004 and neck fusion in 2020. she said that the pain in her low back is constant. No pain radiates down her legs. No numbness, tingling, or weakness. She said that any movement causes more pain. She has tried muscle relaxers  and heat, with no relief.  She is also having right foot pain. She said that she stubbed her foot over a week ago. Her pain is across her right foot and in her pinky toe. She said that it has improved some. She was initially unable to wear shoes, but can do so now.   HPI  Review of Systems   Objective: Vital Signs: Ht 5\' 4"  (1.626 m)   Wt 203 lb (92.1 kg)   LMP 02/10/2015 Comment: had BSO 2016  BMI 34.84 kg/m   Physical Exam Constitutional:      Appearance: She is well-developed.  Eyes:     Pupils: Pupils are equal, round, and reactive to light.  Pulmonary:     Effort: Pulmonary effort is normal.  Skin:    General: Skin is warm and dry.  Neurological:     Mental Status: She is alert and oriented to person, place, and time.  Psychiatric:        Behavior: Behavior normal.     Ortho Exam awake alert and oriented x3.  Comfortable sitting.  Very slow gait related to back pain.  Straight leg raise was negative  bilaterally for any leg pain.  Some mild back discomfort on raising the right leg at 90 degrees.  Reflexes were symmetrical at both knees and decreased at the right ankle.  Motor exam intact.  Normal sensation.  Right foot with resolving ecchymosis at the base of the second toe.  Mild discomfort with the little toe but no swelling or redness or deformity.  Good capillary refill to toes.  Painless range of motion both hips.  Does have percussible tenderness in the lower lumbar spine.  No pain over either SI joint  Specialty Comments:  No specialty comments available.  Imaging: XR Foot Complete Right  Result Date: 04/01/2020 Films of the right foot obtained in 3 projections.  Patient "stubbed" her little toe 2 weeks ago.  I do not see any evidence of a fracture or subluxation.  No obvious arthritis or acute change  XR Lumbar Spine 2-3 Views  Result Date: 04/01/2020 Films of the lumbar spine obtained in 2 projections.  There is been a prior fusion of L5 and S1 with pedicle  screws.  Hardware appears to be intact.  No acute changes.  No significant decrease in the disc space between L4 and L5 but there is sclerosis at the L4-5 facet joints.  No significant scoliosis.  No evidence of listhesis    PMFS History: Patient Active Problem List   Diagnosis Date Noted  . Toe pain, right 04/01/2020  . Constipation 05/09/2018  . Dyspareunia in female 05/09/2018  . Pelvic pain 05/09/2018  . Unilateral primary osteoarthritis, left knee 11/21/2017  . Contusion of left elbow 11/21/2017  . Left facial numbness 04/21/2017  . Allergic rhinitis 03/14/2017  . Status post left knee surgery 02/20/2017  . Left knee pain 02/20/2017  . Hypotension 09/12/2016  . Fatigue 08/22/2016  . Postoperative nausea 04/21/2016  . Onychomycosis 10/15/2015  . Bell's palsy 09/29/2015  . Cutaneous skin tags 02/18/2015  . Complex ovarian cyst 10/15/2014  . Diverticulitis 10/12/2014  . Severe obesity (BMI >= 40) ([place]) 05/26/2014  . Depression 05/31/2013  . Morbid obesity ([place]) 12/04/2009  . 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 Low back pain 12/19/2006  . HYPERTROPHY, BREAST 11/20/2006  . Anxiety state 11/10/2006  . GERD 11/10/2006  . HYPERCHOLESTEROLEMIA, PURE 11/09/2006  . HYPERTENSION, BENIGN ESSENTIAL 11/09/2006  . CHOLECYSTECTOMY, HX OF 11/09/2006   Past Medical History:  Diagnosis Date  . Ankle fracture    left  . Anxiety   . Back pain   . Bell's palsy    Left side, 2009, 2017  . Bell's palsy   . Depression   . Diverticulitis   . GERD (gastroesophageal reflux disease)   . History of hiatal hernia   . Hyperlipidemia   . Hypertension   . Postoperative nausea 04/21/2016  . Toe fracture    left great toe  . Wears glasses     Family History  Problem Relation Age of Onset  . COPD Mother   . Hypertension Mother   . GER disease Mother   . Heart attack Father   . Prostate cancer Father   . Hyperlipidemia Brother   . Hypertension Brother   . Diabetes Brother    . Neuropathy Brother   . Hyperlipidemia Brother   . Hypertension Brother   . Heart attack Paternal Grandmother   . Heart attack Paternal Grandfather   . Stroke Paternal Grandfather   . Cancer Paternal Uncle        prostate and bone  . Cancer Paternal  Uncle        prostate and bone  . Colon cancer Neg Hx   . Stomach cancer Neg Hx     Past Surgical History:  Procedure Laterality Date  . BACK SURGERY  2004   l4/5 fusion  . BREAST REDUCTION SURGERY Bilateral 08/08/2014   Procedure: BILATERAL BREAST REDUCTION  (BREAST);  Surgeon: Irene Limbo, MD;  Location: [facility];  Service: Plastics;  Laterality: Bilateral;  . CERVICAL DISCECTOMY  12/12/2018   3 level   . CHOLECYSTECTOMY     1998  . INJECTION KNEE Left   . KNEE ARTHROSCOPY    . LAPAROSCOPIC BILATERAL SALPINGO OOPHERECTOMY Bilateral 03/11/2015   Procedure: LAPAROSCOPIC BILATERAL SALPINGO OOPHORECTOMY;  Surgeon: Florian Buff, MD;  Location: [facility];  Service: Gynecology;  Laterality: Bilateral;  . LAPAROSCOPIC GASTRIC SLEEVE RESECTION WITH HIATAL HERNIA REPAIR N/A 03/22/2016   Procedure: LAPAROSCOPIC GASTRIC SLEEVE RESECTION WITH HIATAL HERNIA REPAIR WITHUPPER ENDOSCOPY;  Surgeon: Alphonsa Overall, MD;  Location: [facility];  Service: General;  Laterality: N/A;  . REDUCTION MAMMAPLASTY Bilateral   . SEPTOPLASTY  1988  . tonsillectomy  1991  . TONSILLECTOMY     Social History   Occupational History  . Occupation: stay at home mom  Tobacco Use  . Smoking status: Never Smoker  . Smokeless tobacco: Never Used  Vaping Use  . Vaping Use: Never used  Substance and Sexual Activity  . Alcohol use: No  . Drug use: No  . Sexual activity: Not Currently    Partners: Male    Birth control/protection: Post-menopausal, Surgical    Comment: BSO 2016

## 2020-05-13 DIAGNOSIS — R059 Cough, unspecified: Secondary | ICD-10-CM | POA: Diagnosis not present

## 2020-05-13 DIAGNOSIS — R0602 Shortness of breath: Secondary | ICD-10-CM | POA: Diagnosis not present

## 2020-05-13 DIAGNOSIS — U071 COVID-19: Secondary | ICD-10-CM | POA: Diagnosis not present

## 2020-05-13 DIAGNOSIS — R439 Unspecified disturbances of smell and taste: Secondary | ICD-10-CM | POA: Diagnosis not present

## 2020-05-13 DIAGNOSIS — R197 Diarrhea, unspecified: Secondary | ICD-10-CM | POA: Diagnosis not present

## 2020-05-13 DIAGNOSIS — R509 Fever, unspecified: Secondary | ICD-10-CM | POA: Diagnosis not present

## 2020-05-18 DIAGNOSIS — R110 Nausea: Secondary | ICD-10-CM | POA: Diagnosis not present

## 2020-05-18 DIAGNOSIS — F411 Generalized anxiety disorder: Secondary | ICD-10-CM | POA: Diagnosis not present

## 2020-05-18 DIAGNOSIS — R0602 Shortness of breath: Secondary | ICD-10-CM | POA: Diagnosis not present

## 2020-05-18 DIAGNOSIS — U071 COVID-19: Secondary | ICD-10-CM | POA: Diagnosis not present

## 2020-05-18 DIAGNOSIS — R5383 Other fatigue: Secondary | ICD-10-CM | POA: Diagnosis not present

## 2020-05-18 DIAGNOSIS — R439 Unspecified disturbances of smell and taste: Secondary | ICD-10-CM | POA: Diagnosis not present

## 2020-08-25 DIAGNOSIS — R5383 Other fatigue: Secondary | ICD-10-CM | POA: Diagnosis not present

## 2020-08-25 DIAGNOSIS — F5081 Binge eating disorder: Secondary | ICD-10-CM | POA: Diagnosis not present

## 2020-08-25 DIAGNOSIS — U071 COVID-19: Secondary | ICD-10-CM | POA: Diagnosis not present

## 2020-08-25 DIAGNOSIS — F411 Generalized anxiety disorder: Secondary | ICD-10-CM | POA: Diagnosis not present

## 2020-08-25 DIAGNOSIS — M542 Cervicalgia: Secondary | ICD-10-CM | POA: Diagnosis not present

## 2020-10-06 DIAGNOSIS — F331 Major depressive disorder, recurrent, moderate: Secondary | ICD-10-CM | POA: Diagnosis not present

## 2020-10-06 DIAGNOSIS — F411 Generalized anxiety disorder: Secondary | ICD-10-CM | POA: Diagnosis not present

## 2020-10-06 DIAGNOSIS — F6381 Intermittent explosive disorder: Secondary | ICD-10-CM | POA: Diagnosis not present

## 2020-10-14 ENCOUNTER — Encounter (HOSPITAL_COMMUNITY): Payer: Self-pay | Admitting: *Deleted

## 2020-10-27 DIAGNOSIS — E782 Mixed hyperlipidemia: Secondary | ICD-10-CM | POA: Diagnosis not present

## 2020-11-04 DIAGNOSIS — F411 Generalized anxiety disorder: Secondary | ICD-10-CM | POA: Diagnosis not present

## 2020-11-04 DIAGNOSIS — F6381 Intermittent explosive disorder: Secondary | ICD-10-CM | POA: Diagnosis not present

## 2020-11-04 DIAGNOSIS — F331 Major depressive disorder, recurrent, moderate: Secondary | ICD-10-CM | POA: Diagnosis not present

## 2021-01-20 DIAGNOSIS — F331 Major depressive disorder, recurrent, moderate: Secondary | ICD-10-CM | POA: Diagnosis not present

## 2021-01-20 DIAGNOSIS — F411 Generalized anxiety disorder: Secondary | ICD-10-CM | POA: Diagnosis not present

## 2021-01-20 DIAGNOSIS — F6381 Intermittent explosive disorder: Secondary | ICD-10-CM | POA: Diagnosis not present

## 2021-03-12 DIAGNOSIS — J069 Acute upper respiratory infection, unspecified: Secondary | ICD-10-CM | POA: Diagnosis not present

## 2021-03-12 DIAGNOSIS — E782 Mixed hyperlipidemia: Secondary | ICD-10-CM | POA: Diagnosis not present

## 2021-04-13 ENCOUNTER — Other Ambulatory Visit: Payer: Self-pay

## 2021-04-13 ENCOUNTER — Ambulatory Visit
Admission: EM | Admit: 2021-04-13 | Discharge: 2021-04-13 | Disposition: A | Discharge status: Home / Self Care | Payer: Medicare HMO

## 2021-04-13 ENCOUNTER — Telehealth: Payer: Self-pay

## 2021-04-13 ENCOUNTER — Encounter: Payer: Self-pay | Admitting: Emergency Medicine

## 2021-04-13 ENCOUNTER — Ambulatory Visit (HOSPITAL_COMMUNITY)
Admission: RE | Admit: 2021-04-13 | Discharge: 2021-04-13 | Disposition: A | Discharge status: Home / Self Care | Payer: Medicare HMO | Source: Ambulatory Visit | Attending: Student | Admitting: Student

## 2021-04-13 DIAGNOSIS — M7918 Myalgia, other site: Secondary | ICD-10-CM | POA: Insufficient documentation

## 2021-04-13 DIAGNOSIS — Z8701 Personal history of pneumonia (recurrent): Secondary | ICD-10-CM

## 2021-04-13 DIAGNOSIS — M25551 Pain in right hip: Secondary | ICD-10-CM | POA: Diagnosis not present

## 2021-04-13 DIAGNOSIS — J189 Pneumonia, unspecified organism: Secondary | ICD-10-CM | POA: Diagnosis not present

## 2021-04-13 DIAGNOSIS — Z09 Encounter for follow-up examination after completed treatment for conditions other than malignant neoplasm: Secondary | ICD-10-CM | POA: Insufficient documentation

## 2021-04-13 MED ORDER — TIZANIDINE HCL 4 MG PO TABS: 4.0000 mg | ORAL_TABLET | Freq: Three times a day (TID) | ORAL | 0 refills | Status: DC | PRN

## 2021-04-13 MED ORDER — ONDANSETRON 4 MG PO TBDP: 4.0000 mg | ORAL_TABLET | Freq: Three times a day (TID) | ORAL | 0 refills | Status: DC | PRN

## 2021-04-13 NOTE — Discharge Instructions (Addendum)
-  Start the muscle relaxer-Zanaflex (tizanidine), up to 3 times daily for muscle spasms and pain.  This can make you drowsy, so take at bedtime or when you do not need to drive or operate machinery. -Head to the ED for your chest xray. We will call you in the next 1-2 days if this is abnormal.  -Follow-up with your orthopedist if symptoms persist.

## 2021-04-13 NOTE — ED Provider Notes (Addendum)
[facility]    CSN: [id number] Arrival date & time: 04/13/21  1600      History   Chief Complaint Chief Complaint  Patient presents with   Hip Pain    HPI Cassandra Mcconnell is a 55 y.o. female presenting with R hip pain x1 year, progressively worsening. Has tried ibuprofen, heating pad, bengay without relief. History L knee surgery and weakness and frequent falls due to this, though the hip pain does not seem to cause weakness on the right side.  Describes this as pain originating in the buttock and shooting down to the posterior thigh, worse with sitting and flexion.  Denies new weakness in legs, new urinary symptoms, sensation change in extremities. Apparently was diagnosed with pneumonia 1 month ago at PCP and treated with doxycycline, I do not have these records.  She is feeling well now, but is requesting repeat chest x-ray to confirm resolution.  Denies cough, shortness of breath, dizziness, chest pain, weakness, fevers/chills.   History 2004 lumbar spine fusion. She is followed by orthopedist/spine specialist, but has not discussed her current symptoms, though they have been present for 1 year.Marland Kitchen  HPI  Past Medical History:  Diagnosis Date   Ankle fracture    left   Anxiety    Back pain    Bell's palsy    Left side, 2009, 2017   Bell's palsy    Depression    Diverticulitis    GERD (gastroesophageal reflux disease)    History of hiatal hernia    Hyperlipidemia    Hypertension    Postoperative nausea 04/21/2016   Toe fracture    left great toe   Wears glasses     Patient Active Problem List   Diagnosis Date Noted   Toe pain, right 04/01/2020   Constipation 05/09/2018   Dyspareunia in female 05/09/2018   Pelvic pain 05/09/2018   Unilateral primary osteoarthritis, left knee 11/21/2017   Contusion of left elbow 11/21/2017   Left facial numbness 04/21/2017   Allergic rhinitis 03/14/2017   Status post left knee surgery 02/20/2017   Left knee pain  02/20/2017   Hypotension 09/12/2016   Fatigue 08/22/2016   Postoperative nausea 04/21/2016   Onychomycosis 10/15/2015   Bell's palsy 09/29/2015   Cutaneous skin tags 02/18/2015   Complex ovarian cyst 10/15/2014   Diverticulitis 10/12/2014   Severe obesity (BMI >= 40) ([place]) 05/26/2014   Depression 05/31/2013   Morbid obesity ([place]) 12/04/2009   MULTIPLE CRANIAL NERVE PALSIES 06/11/2007   Low back pain 12/19/2006   HYPERTROPHY, BREAST 11/20/2006   Anxiety state 11/10/2006   GERD 11/10/2006   HYPERCHOLESTEROLEMIA, PURE 11/09/2006   HYPERTENSION, BENIGN ESSENTIAL 11/09/2006   CHOLECYSTECTOMY, HX OF 11/09/2006    Past Surgical History:  Procedure Laterality Date   BACK SURGERY  2004   l4/5 fusion   BREAST REDUCTION SURGERY Bilateral 08/08/2014   Procedure: BILATERAL BREAST REDUCTION  (BREAST);  Surgeon: Irene Limbo, MD;  Location: [place];  Service: Plastics;  Laterality: Bilateral;   CERVICAL DISCECTOMY  12/12/2018   3 level    CHOLECYSTECTOMY     1998   INJECTION KNEE Left    KNEE ARTHROSCOPY     LAPAROSCOPIC BILATERAL SALPINGO OOPHERECTOMY Bilateral 03/11/2015   Procedure: LAPAROSCOPIC BILATERAL SALPINGO OOPHORECTOMY;  Surgeon: Florian Buff, MD;  Location: [facility];  Service: Gynecology;  Laterality: Bilateral;   LAPAROSCOPIC GASTRIC SLEEVE RESECTION WITH HIATAL HERNIA REPAIR N/A 03/22/2016   Procedure: LAPAROSCOPIC GASTRIC SLEEVE RESECTION WITH HIATAL  HERNIA REPAIR WITHUPPER ENDOSCOPY;  Surgeon: Alphonsa Overall, MD;  Location: [facility];  Service: General;  Laterality: N/A;   REDUCTION MAMMAPLASTY Bilateral    SEPTOPLASTY  1988   tonsillectomy  1991   TONSILLECTOMY      OB History     Gravida  1   Para      Term      Preterm      AB  1   Living         SAB  1   IAB      Ectopic      Multiple      Live Births               Home Medications    Prior to Admission medications   Medication Sig Start Date End Date Taking?  Authorizing Provider  tiZANidine (ZANAFLEX) 4 MG tablet Take 1 tablet (4 mg total) by mouth every 8 (eight) hours as needed for muscle spasms. 04/13/21  Yes Hazel Sams, PA-C  topiramate (TOPAMAX) 100 MG tablet Take 100 mg by mouth 2 (two) times daily.   Yes [provider]  acetaminophen (TYLENOL) 500 MG tablet Take 1,000 mg by mouth every 6 (six) hours as needed. For pain     [provider]  atorvastatin (LIPITOR) 10 MG tablet Take 1 tablet (10 mg total) by mouth daily. 01/22/19   Cirigliano, Garvin Fila, DO  citalopram (CELEXA) 20 MG tablet Take 1 tablet (20 mg total) by mouth 2 (two) times a day. 11/22/18   Cirigliano, Garvin Fila, DO  HYDROmorphone (DILAUDID) 4 MG tablet Take by mouth every 6 (six) hours as needed for severe pain.  Patient not taking: Reported on 04/01/2020    [provider]  IBUPROFEN PO Take by mouth as needed.    [provider]  pantoprazole (PROTONIX) 40 MG tablet Take 40 mg by mouth once. Every night Patient not taking: Reported on 04/01/2020 01/23/19   [provider]  polyvinyl alcohol-povidone (REFRESH) 1.4-0.6 % ophthalmic solution Place 1-2 drops into both eyes daily as needed (for dry eye relief).     [provider]  promethazine (PHENERGAN) 25 MG tablet Take 1 tablet (25 mg total) by mouth every 8 (eight) hours as needed for nausea or vomiting. 06/29/19   Lestine Box, PA-C    Family History Family History  Problem Relation Age of Onset   COPD Mother    Hypertension Mother    GER disease Mother    Heart attack Father    Prostate cancer Father    Hyperlipidemia Brother    Hypertension Brother    Diabetes Brother    Neuropathy Brother    Hyperlipidemia Brother    Hypertension Brother    Heart attack Paternal Grandmother    Heart attack Paternal Grandfather    Stroke Paternal Grandfather    Cancer Paternal Uncle        prostate and bone   Cancer Paternal Uncle        prostate and bone   Colon cancer Neg  Hx    Stomach cancer Neg Hx     Social History Social History   Tobacco Use   Smoking status: Never   Smokeless tobacco: Never  Vaping Use   Vaping Use: Never used  Substance Use Topics   Alcohol use: No   Drug use: No     Allergies   Zofran [ondansetron hcl], Azithromycin, Conjugated estrogens, Cyclobenzaprine hcl, Dicyclomine hcl, Norco [hydrocodone-acetaminophen], Nsaids,  Oxycontin [oxycodone], Percocet [oxycodone-acetaminophen], Robaxin [methocarbamol], Rosuvastatin, and Wellbutrin [bupropion hcl]   Review of Systems Review of Systems  Constitutional:  Negative for appetite change, chills and fever.  HENT:  Negative for congestion, ear pain, rhinorrhea, sinus pressure, sinus pain and sore throat.   Eyes:  Negative for redness and visual disturbance.  Respiratory:  Negative for cough, chest tightness, shortness of breath and wheezing.   Cardiovascular:  Negative for chest pain and palpitations.  Gastrointestinal:  Negative for abdominal pain, constipation, diarrhea, nausea and vomiting.  Genitourinary:  Negative for dysuria, frequency and urgency.  Musculoskeletal:  Negative for myalgias.  Neurological:  Negative for dizziness, weakness and headaches.  Psychiatric/Behavioral:  Negative for confusion.   All other systems reviewed and are negative.   Physical Exam Triage Vital Signs ED Triage Vitals  Enc Vitals Group     BP 04/13/21 1646 132/69     Pulse Rate 04/13/21 1646 81     Resp 04/13/21 1646 18     Temp 04/13/21 1646 98.7 F (37.1 C)     Temp Source 04/13/21 1646 Oral     SpO2 04/13/21 1646 99 %     Weight --      Height --      Head Circumference --      Peak Flow --      Pain Score 04/13/21 1648 10     Pain Loc --      Pain Edu? --      Excl. in [place]? --    No data found.  Updated Vital Signs BP 132/69 (BP Location: Right Arm)    Pulse 81    Temp 98.7 F (37.1 C) (Oral)    Resp 18    LMP 02/10/2015 Comment: had BSO 2016   SpO2 99%   Visual  Acuity Right Eye Distance:   Left Eye Distance:   Bilateral Distance:    Right Eye Near:   Left Eye Near:    Bilateral Near:     Physical Exam Vitals reviewed.  Constitutional:      General: She is not in acute distress.    Appearance: Normal appearance. She is not ill-appearing or diaphoretic.  HENT:     Head: Normocephalic and atraumatic.  Cardiovascular:     Rate and Rhythm: Normal rate and regular rhythm.     Heart sounds: Normal heart sounds.  Pulmonary:     Effort: Pulmonary effort is normal.     Breath sounds: Normal breath sounds. No decreased breath sounds, wheezing, rhonchi or rales.  Abdominal:     Tenderness: There is no guarding or rebound. Negative signs include Murphy's sign.  Musculoskeletal:     Comments: Anterior distal ribs TTP without effusion.   No midline spinous tenderness deformity or stepoff. Well healed surgical scar overlying lumbar spine. No paraspinous tenderness. Strength and sensation grossly intact upper and lower extremities. No saddle anesthesia. Negative straight leg raise bilaterally. No pain with internal and external radiation. DP 2+ bilaterally. No pedal edema. Patient rates pain 10/10 but is sitting comfortably at rest and throughout exam.  Skin:    General: Skin is warm.  Neurological:     General: No focal deficit present.     Mental Status: She is alert and oriented to person, place, and time.  Psychiatric:        Mood and Affect: Mood normal.        Behavior: Behavior normal.        Thought Content: Thought content  normal.        Judgment: Judgment normal.     UC Treatments / Results  Labs (all labs ordered are listed, but only abnormal results are displayed) Labs Reviewed - No data to display  EKG   Radiology No results found.  Procedures Procedures (including critical care time)  Medications Ordered in UC Medications - No data to display  Initial Impression / Assessment and Plan / UC Course  I have reviewed the  triage vital signs and the nursing notes.  Pertinent labs & imaging results that were available during my care of the patient were reviewed by me and considered in my medical decision making (see chart for details).     This patient is a very pleasant 55 y.o. year old female presenting with R hip pain x1 year, and concern for pneumonia.   Afebrile, nontachy; does not have clinical symptoms or signs of pneumonia.  Patient states that she did have pneumonia 1 month ago and was treated with doxycycline by her primary care, I do not have access to these records.  She is having some anterior chest wall pain today, requesting a chest x-ray to rule out repeat pneumonia.  Outpatient imaging ordered, discussed that I have very low suspicion for pneumonia. CXR was negative. Multiple drug allergies, states she gets shortness of breath with penicillins. States she can tolerate levaquin and doxycycline.   For hip pain, this has been present for over 1 year and she is already followed by spine specialist/orthopedist, though she has not seen them for this issue.  She does have a distant history of lumbar spine fusion in 2004.  No red flag symptoms today.  DDx is sciatica versus trochanteric bursitis versus other.  Trial of Zanaflex, she can tolerate this despite allergy to multiple other muscle relaxers. No recent falls.   F/u for PCP for chronic issues/ concerns. ED return precautions discussed. Patient verbalizes understanding and agreement.      Final Clinical Impressions(s) / UC Diagnoses   Final diagnoses:  Right hip pain  History of bacterial pneumonia  Abdominal muscle pain     Discharge Instructions      -Start the muscle relaxer-Zanaflex (tizanidine), up to 3 times daily for muscle spasms and pain.  This can make you drowsy, so take at bedtime or when you do not need to drive or operate machinery. -Head to the ED for your chest xray. We will call you in the next 1-2 days if this is abnormal.   -Follow-up with your orthopedist if symptoms persist.     Initially erroneously sent zofran - pharmacy was called and this was cancelled by my RN.   ED Prescriptions     Medication Sig Dispense Auth. Provider   ondansetron (ZOFRAN-ODT) 4 MG disintegrating tablet  (Status: Discontinued) Take 1 tablet (4 mg total) by mouth every 8 (eight) hours as needed for nausea or vomiting. 21 tablet Hazel Sams, PA-C   tiZANidine (ZANAFLEX) 4 MG tablet Take 1 tablet (4 mg total) by mouth every 8 (eight) hours as needed for muscle spasms. 21 tablet Hazel Sams, PA-C      PDMP not reviewed this encounter.   Hazel Sams, PA-C 04/13/21 1748    Hazel Sams, PA-C 04/15/21 [phone]

## 2021-04-13 NOTE — ED Triage Notes (Signed)
Right hip pain x 1 week.  States pain has "progressively gotten worse to the point it is unbearable"  has tried Ibuprofen, heating pad and bengay without relief.

## 2021-04-14 DIAGNOSIS — F331 Major depressive disorder, recurrent, moderate: Secondary | ICD-10-CM | POA: Diagnosis not present

## 2021-04-14 DIAGNOSIS — F411 Generalized anxiety disorder: Secondary | ICD-10-CM | POA: Diagnosis not present

## 2021-04-14 DIAGNOSIS — F6381 Intermittent explosive disorder: Secondary | ICD-10-CM | POA: Diagnosis not present

## 2021-04-20 ENCOUNTER — Other Ambulatory Visit: Payer: Self-pay

## 2021-04-20 ENCOUNTER — Ambulatory Visit: Payer: Medicare HMO | Admitting: Orthopaedic Surgery

## 2021-04-20 ENCOUNTER — Encounter: Payer: Self-pay | Admitting: Orthopaedic Surgery

## 2021-04-20 ENCOUNTER — Ambulatory Visit (INDEPENDENT_AMBULATORY_CARE_PROVIDER_SITE_OTHER): Payer: Medicare HMO

## 2021-04-20 DIAGNOSIS — M79604 Pain in right leg: Secondary | ICD-10-CM | POA: Diagnosis not present

## 2021-04-20 DIAGNOSIS — H524 Presbyopia: Secondary | ICD-10-CM | POA: Diagnosis not present

## 2021-04-20 DIAGNOSIS — M25551 Pain in right hip: Secondary | ICD-10-CM | POA: Insufficient documentation

## 2021-04-20 MED ORDER — BUPIVACAINE HCL 0.25 % IJ SOLN
2.0000 mL | INTRAMUSCULAR | Status: AC | PRN
  Administered 2021-04-20: 17:00:00 2 mL via INTRA_ARTICULAR

## 2021-04-20 MED ORDER — METHYLPREDNISOLONE ACETATE 40 MG/ML IJ SUSP
80.0000 mg | INTRAMUSCULAR | Status: AC | PRN
  Administered 2021-04-20: 17:00:00 80 mg via INTRA_ARTICULAR

## 2021-04-20 MED ORDER — LIDOCAINE HCL 1 % IJ SOLN
2.0000 mL | INTRAMUSCULAR | Status: AC | PRN
  Administered 2021-04-20: 17:00:00 2 mL

## 2021-04-20 NOTE — Progress Notes (Signed)
Office Visit Note   Patient: Cassandra Mcconnell           Date of Birth: [birth year]           MRN: [id number] Visit Date: 04/20/2021              Requested by: No referring provider defined for this encounter. PCP: Pcp, No   Assessment & Plan: Visit Diagnoses:  1. Pain in right hip   2. Pain in right leg     Plan: Cassandra Mcconnell is been complaining of of pain in her low back right buttock, right greater trochanter and right groin for almost a year.  She denies any history of injury or trauma.  Her past history is positive for prior L5-S1 fusion by Cassandra Mcconnell.  She does have a follow-up appointment with him on 14 January.  I believe a lot of her present symptoms are relative to her back and we will order an MRI scan at [facility] in anticipation of his evaluation.  She is also having direct pain over the greater trochanter of her right hip and will inject this with cortisone.  The purpose is both diagnostic and therapeutic.  I think that her pain may be multifactorial and involves her spine and possibly even her hip.  There is evidence of possible impingement by her right hip films but there is no loss of the joint space.  If she does have bursitis then the injection will make a big difference and strongly will help Cassandra Mcconnell in his evaluation.  Follow-Up Instructions: Return if symptoms worsen or fail to improve.   Orders:  Orders Placed This Encounter  Procedures   Large Joint Inj: R greater trochanter   XR HIP UNILAT W OR W/O PELVIS 2-3 VIEWS RIGHT   MR Lumbar Spine w/o contrast   No orders of the defined types were placed in this encounter.     Procedures: Large Joint Inj: R greater trochanter on 04/20/2021 4:30 PM Indications: pain and diagnostic evaluation Details: 25 G 1.5 in needle  Arthrogram: No  Medications: 2 mL lidocaine 1 %; 80 mg methylPREDNISolone acetate 40 MG/ML; 2 mL bupivacaine 0.25 % Procedure, treatment alternatives, risks and benefits explained, specific risks  discussed. Consent was given by the patient. Immediately prior to procedure a time out was called to verify the correct patient, procedure, equipment, support staff and site/side marked as required. Patient was prepped and draped in the usual sterile fashion.      Clinical Data: No additional findings.   Subjective: Chief Complaint  Patient presents with   Right Hip - Pain  Patient presents today for right hip pain. She said that it has been hurting for over a year, but getting worse. She said that it gives out and causes her to fall. She has pain laterally and in her groin, along with some pain down the front of her thigh. She uses a cane to help ambulate. She states that the pain is constant and she cannot sleep. She has taken a Tizanidine and Ibuprofen. Past history is significant that she had an L5-S1 fusion by Cassandra Mcconnell nearly 20 years ago.  She is experiencing pain in the low back right buttock and right hip.  No history of injury or trauma.  This but no numbness or tingling distal to the mid right thigh.  She does have an appointment with Cassandra Mcconnell on 14 January  HPI  Review of Systems   Objective: Vital Signs:  LMP 02/10/2015 Comment: had BSO 2016  Physical Exam Constitutional:      Appearance: She is well-developed.  Eyes:     Pupils: Pupils are equal, round, and reactive to light.  Pulmonary:     Effort: Pulmonary effort is normal.  Skin:    General: Skin is warm and dry.  Neurological:     Mental Status: She is alert and oriented to person, place, and time.  Psychiatric:        Behavior: Behavior normal.    Ortho Exam awake alert and oriented x3.  Comfortable sitting there is some pain in her right groin with external rotation but very little with internal rotation.  She was tender directly over the greater trochanter.  No thigh pain.  Straight leg raise negative.  There were some areas of tenderness in the the lumbosacral junction.  No pain over the SI joint.  There is  no pain over the ischium.  Motor exam intact  Specialty Comments:  No specialty comments available.  Imaging: XR HIP UNILAT W OR W/O PELVIS 2-3 VIEWS RIGHT  Result Date: 04/20/2021 [facility] the pelvis demonstrate a little irregularity along the greater trochanter of the right hip.  The joint space appears to be well-maintained.  There is some hypertrophic changes of the lateral acetabulum and what might be some prominence of the lateral femoral head consistent with femoral acetabular impingement    PMFS History: Patient Active Problem List   Diagnosis Date Noted   Pain in right hip 04/20/2021   Toe pain, right 04/01/2020   Constipation 05/09/2018   Dyspareunia in female 05/09/2018   Pelvic pain 05/09/2018   Unilateral primary osteoarthritis, left knee 11/21/2017   Contusion of left elbow 11/21/2017   Left facial numbness 04/21/2017   Allergic rhinitis 03/14/2017   Status post left knee surgery 02/20/2017   Left knee pain 02/20/2017   Hypotension 09/12/2016   Fatigue 08/22/2016   Postoperative nausea 04/21/2016   Onychomycosis 10/15/2015   Bell's palsy 09/29/2015   Cutaneous skin tags 02/18/2015   Complex ovarian cyst 10/15/2014   Diverticulitis 10/12/2014   Severe obesity (BMI >= 40) ([place]) 05/26/2014   Depression 05/31/2013   Morbid obesity ([place]) 12/04/2009   MULTIPLE CRANIAL NERVE PALSIES 06/11/2007   Low back pain 12/19/2006   HYPERTROPHY, BREAST 11/20/2006   Anxiety state 11/10/2006   GERD 11/10/2006   HYPERCHOLESTEROLEMIA, PURE 11/09/2006   HYPERTENSION, BENIGN ESSENTIAL 11/09/2006   CHOLECYSTECTOMY, HX OF 11/09/2006   Past Medical History:  Diagnosis Date   Ankle fracture    left   Anxiety    Back pain    Bell's palsy    Left side, 2009, 2017   Bell's palsy    Depression    Diverticulitis    GERD (gastroesophageal reflux disease)    History of hiatal hernia    Hyperlipidemia    Hypertension    Postoperative nausea 04/21/2016   Toe fracture    left great  toe   Wears glasses     Family History  Problem Relation Age of Onset   COPD Mother    Hypertension Mother    GER disease Mother    Heart attack Father    Prostate cancer Father    Hyperlipidemia Brother    Hypertension Brother    Diabetes Brother    Neuropathy Brother    Hyperlipidemia Brother    Hypertension Brother    Heart attack Paternal Grandmother    Heart attack Paternal Grandfather    Stroke  Paternal Grandfather    Cancer Paternal Uncle        prostate and bone   Cancer Paternal Uncle        prostate and bone   Colon cancer Neg Hx    Stomach cancer Neg Hx     Past Surgical History:  Procedure Laterality Date   BACK SURGERY  2004   l4/5 fusion   BREAST REDUCTION SURGERY Bilateral 08/08/2014   Procedure: BILATERAL BREAST REDUCTION  (BREAST);  Surgeon: Irene Limbo, MD;  Location: [place];  Service: Plastics;  Laterality: Bilateral;   CERVICAL DISCECTOMY  12/12/2018   3 level    CHOLECYSTECTOMY     1998   INJECTION KNEE Left    KNEE ARTHROSCOPY     LAPAROSCOPIC BILATERAL SALPINGO OOPHERECTOMY Bilateral 03/11/2015   Procedure: LAPAROSCOPIC BILATERAL SALPINGO OOPHORECTOMY;  Surgeon: Florian Buff, MD;  Location: [facility];  Service: Gynecology;  Laterality: Bilateral;   LAPAROSCOPIC GASTRIC SLEEVE RESECTION WITH HIATAL HERNIA REPAIR N/A 03/22/2016   Procedure: LAPAROSCOPIC GASTRIC SLEEVE RESECTION WITH HIATAL HERNIA REPAIR WITHUPPER ENDOSCOPY;  Surgeon: Alphonsa Overall, MD;  Location[facility];  Service: General;  Laterality: N/A;   REDUCTION MAMMAPLASTY Bilateral    SEPTOPLASTY  1988   tonsillectomy  1991   TONSILLECTOMY     Social History   Occupational History   Occupation: stay at home mom  Tobacco Use   Smoking status: Never   Smokeless tobacco: Never  Vaping Use   Vaping Use: Never used  Substance and Sexual Activity   Alcohol use: No   Drug use: No   Sexual activity: Not Currently    Partners: Male    Birth control/protection:  Post-menopausal, Surgical    Comment: BSO 2016

## 2021-04-28 ENCOUNTER — Ambulatory Visit: Payer: Medicare HMO | Admitting: Nurse Practitioner

## 2021-04-29 ENCOUNTER — Other Ambulatory Visit: Payer: Self-pay

## 2021-04-29 ENCOUNTER — Ambulatory Visit (HOSPITAL_COMMUNITY)
Admission: RE | Admit: 2021-04-29 | Discharge: 2021-04-29 | Disposition: A | Discharge status: Home / Self Care | Payer: Medicare HMO | Source: Ambulatory Visit | Attending: Orthopaedic Surgery | Admitting: Orthopaedic Surgery

## 2021-04-29 DIAGNOSIS — M25551 Pain in right hip: Secondary | ICD-10-CM | POA: Diagnosis not present

## 2021-04-29 DIAGNOSIS — M545 Low back pain, unspecified: Secondary | ICD-10-CM | POA: Diagnosis not present

## 2021-04-29 DIAGNOSIS — M79604 Pain in right leg: Secondary | ICD-10-CM | POA: Diagnosis not present

## 2021-05-04 ENCOUNTER — Ambulatory Visit: Payer: Medicare HMO | Admitting: Family Medicine

## 2021-05-05 ENCOUNTER — Encounter: Payer: Self-pay | Admitting: Family Medicine

## 2021-05-05 ENCOUNTER — Ambulatory Visit (INDEPENDENT_AMBULATORY_CARE_PROVIDER_SITE_OTHER): Payer: Medicare HMO | Admitting: Family Medicine

## 2021-05-05 VITALS — BP 120/71 | HR 74 | Temp 98.1°F | Ht 64.0 in | Wt 198.0 lb

## 2021-05-05 DIAGNOSIS — K219 Gastro-esophageal reflux disease without esophagitis: Secondary | ICD-10-CM | POA: Diagnosis not present

## 2021-05-05 DIAGNOSIS — R632 Polyphagia: Secondary | ICD-10-CM

## 2021-05-05 DIAGNOSIS — F339 Major depressive disorder, recurrent, unspecified: Secondary | ICD-10-CM

## 2021-05-05 DIAGNOSIS — Z6833 Body mass index (BMI) 33.0-33.9, adult: Secondary | ICD-10-CM

## 2021-05-05 DIAGNOSIS — M159 Polyosteoarthritis, unspecified: Secondary | ICD-10-CM

## 2021-05-05 DIAGNOSIS — Z90722 Acquired absence of ovaries, bilateral: Secondary | ICD-10-CM | POA: Insufficient documentation

## 2021-05-05 DIAGNOSIS — E7800 Pure hypercholesterolemia, unspecified: Secondary | ICD-10-CM

## 2021-05-05 DIAGNOSIS — F411 Generalized anxiety disorder: Secondary | ICD-10-CM

## 2021-05-05 DIAGNOSIS — I10 Essential (primary) hypertension: Secondary | ICD-10-CM

## 2021-05-05 MED ORDER — ATORVASTATIN CALCIUM 20 MG PO TABS: 20.0000 mg | ORAL_TABLET | Freq: Every day | ORAL | 1 refills | Status: DC

## 2021-05-05 NOTE — Progress Notes (Signed)
Subjective:  Patient ID: Cassandra Mcconnell, female    DOB: 02-Jun-1965, 56 y.o.   MRN: [id number]  Patient Care Team: Baruch Gouty, FNP as PCP - General (Family Medicine) Irene Limbo, MD as Consulting Physician (Plastic Surgery)   Chief Complaint:  Establish Care   HPI: Cassandra Mcconnell is a 56 y.o. female presenting on 05/05/2021 for Establish Care  Patient presents today to establish care with new PCP.  She was formally followed by Dr. Nevada Crane, those records are not available for review and have been requested.  She is followed by orthopedic on a regular basis for osteoarthritis of multiple joints.  She is followed by psychiatry for anxiety, depression, and binge eating.  She has hypertension, hyperlipidemia, GERD, and obesity. Her hypertension is managed with Topamax which also helps with her binge eating.  She is on atorvastatin for cholesterol and tolerates well.  She does not exercise due to chronic arthritic pain and gait instability.  She does have to use a cane for ambulation.  She does not follow a specific diet.  GAD 7 : Generalized Anxiety Score 05/05/2021  Nervous, Anxious, on Edge 0  Control/stop worrying 0  Worry too much - different things 0  Trouble relaxing 0  Restless 0  Easily annoyed or irritable 0  Afraid - awful might happen 0  Total GAD 7 Score 0  Anxiety Difficulty Not difficult at all    Depression screen [facility] 2/9 05/05/2021 09/12/2016 05/31/2016 04/13/2016 02/17/2016  Decreased Interest 0 3 0 0 0  Down, Depressed, Hopeless 0 3 0 0 0  PHQ - 2 Score 0 6 0 0 0  Altered sleeping 3 3 - - -  Tired, decreased energy 0 1 - - -  Change in appetite 0 0 - - -  Feeling bad or failure about yourself  0 3 - - -  Trouble concentrating 0 3 - - -  Moving slowly or fidgety/restless 0 1 - - -  Suicidal thoughts 0 0 - - -  PHQ-9 Score 3 17 - - -  Difficult doing work/chores Not difficult at all Somewhat difficult - - -  Some recent data might be hidden     Relevant  past medical, surgical, family, and social history reviewed and updated as indicated.  Allergies and medications reviewed and updated. Data reviewed: Chart in Epic.   Past Medical History:  Diagnosis Date   Ankle fracture    left   Anxiety    Back pain    Bell's palsy    Left side, 2009, 2017   Bell's palsy    Depression    Diverticulitis    GERD (gastroesophageal reflux disease)    History of hiatal hernia    Hyperlipidemia    Hypertension    Postoperative nausea 04/21/2016   Toe fracture    left great toe   Wears glasses     Past Surgical History:  Procedure Laterality Date   BACK SURGERY  2004   l4/5 fusion   BREAST REDUCTION SURGERY Bilateral 08/08/2014   Procedure: BILATERAL BREAST REDUCTION  (BREAST);  Surgeon: Irene Limbo, MD;  Location: [facility];  Service: Plastics;  Laterality: Bilateral;   CATARACT EXTRACTION Bilateral    CERVICAL DISCECTOMY  12/12/2018   3 level    CHOLECYSTECTOMY     1998   INJECTION KNEE Left    KNEE ARTHROSCOPY     LAPAROSCOPIC BILATERAL SALPINGO OOPHERECTOMY Bilateral 03/11/2015   Procedure: LAPAROSCOPIC BILATERAL  SALPINGO OOPHORECTOMY;  Surgeon: Florian Buff, MD;  Location: [facility];  Service: Gynecology;  Laterality: Bilateral;   LAPAROSCOPIC GASTRIC SLEEVE RESECTION WITH HIATAL HERNIA REPAIR N/A 03/22/2016   Procedure: LAPAROSCOPIC GASTRIC SLEEVE RESECTION WITH HIATAL HERNIA REPAIR WITHUPPER ENDOSCOPY;  Surgeon: Alphonsa Overall, MD;  Location: [facility];  Service: General;  Laterality: N/A;   REDUCTION MAMMAPLASTY Bilateral    SEPTOPLASTY  1988   tonsillectomy  1991   TONSILLECTOMY      Social History   Socioeconomic History   Marital status: Married    Spouse name: Not on file   Number of children: 1   Years of education: Not on file   Highest education level: Not on file  Occupational History   Occupation: stay at home mom  Tobacco Use   Smoking status: Never   Smokeless tobacco: Never  Vaping Use    Vaping Use: Never used  Substance and Sexual Activity   Alcohol use: No   Drug use: No   Sexual activity: Not Currently    Partners: Male    Birth control/protection: Post-menopausal, Surgical    Comment: BSO 2016  Other Topics Concern   Not on file  Social History Narrative   Lives at home with her husband   Left handed   Caffeine: 2-4 cups of coffee daily   Social Determinants of Health   Financial Resource Strain: Not on file  Food Insecurity: Not on file  Transportation Needs: Not on file  Physical Activity: Not on file  Stress: Not on file  Social Connections: Not on file  Intimate Partner Violence: Not on file    Outpatient Encounter Medications as of 05/05/2021  Medication Sig   acetaminophen (TYLENOL) 500 MG tablet Take 1,000 mg by mouth every 6 (six) hours as needed. For pain    atorvastatin (LIPITOR) 20 MG tablet Take 20 mg by mouth daily.   citalopram (CELEXA) 20 MG tablet Take 1 tablet (20 mg total) by mouth 2 (two) times a day.   desvenlafaxine (PRISTIQ) 50 MG 24 hr tablet Take 50 mg by mouth at bedtime.   IBUPROFEN PO Take by mouth as needed.   polyvinyl alcohol-povidone (HYPOTEARS) 1.4-0.6 % ophthalmic solution Place 1-2 drops into both eyes daily as needed (for dry eye relief).    promethazine (PHENERGAN) 25 MG tablet Take 1 tablet (25 mg total) by mouth every 8 (eight) hours as needed for nausea or vomiting.   tiZANidine (ZANAFLEX) 4 MG tablet Take 1 tablet (4 mg total) by mouth every 8 (eight) hours as needed for muscle spasms.   topiramate (TOPAMAX) 50 MG tablet SMARTSIG:1 Tablet(s) By Mouth Every Evening   [DISCONTINUED] atorvastatin (LIPITOR) 10 MG tablet Take 1 tablet (10 mg total) by mouth daily.   [DISCONTINUED] topiramate (TOPAMAX) 100 MG tablet Take 100 mg by mouth 2 (two) times daily.   No facility-administered encounter medications on file as of 05/05/2021.    Allergies  Allergen Reactions   Zofran [Ondansetron Hcl] Shortness Of Breath     Numbness,   Azithromycin Swelling   Conjugated Estrogens Other (See Comments)    emotional   Cyclobenzaprine Hcl     REACTION: disorientation   Dicyclomine Hcl Other (See Comments)    unknown   Norco [Hydrocodone-Acetaminophen] Other (See Comments)    Felt like she was going through withdrawal   Nsaids Other (See Comments)    Cannot have because of bariatric surgery   Oxycontin [Oxycodone]    Percocet [Oxycodone-Acetaminophen]  Hypotension    Robaxin [Methocarbamol]    Rosuvastatin Dermatitis   Wellbutrin [Bupropion Hcl] Other (See Comments)    Emotional, makes depression worse    Review of Systems  Constitutional:  Positive for activity change and fatigue. Negative for appetite change, chills, diaphoresis, fever and unexpected weight change.  Respiratory:  Negative for cough, chest tightness and shortness of breath.   Cardiovascular:  Negative for chest pain, palpitations and leg swelling.  Gastrointestinal:  Negative for abdominal pain, constipation, diarrhea, nausea and vomiting.  Genitourinary:  Negative for decreased urine volume and difficulty urinating.  Musculoskeletal:  Positive for arthralgias, back pain, gait problem and joint swelling.  Neurological:  Negative for dizziness, tremors, syncope, facial asymmetry, speech difficulty, weakness, light-headedness, numbness and headaches.  Psychiatric/Behavioral:  Positive for sleep disturbance. Negative for confusion.   All other systems reviewed and are negative.      Objective:  BP 120/71    Pulse 74    Temp 98.1 F (36.7 C)    Ht $R'5\' 4"'DI$  (1.626 m)    Wt 198 lb (89.8 kg)    LMP 02/10/2015 Comment: had BSO 2016   SpO2 96%    BMI 33.99 kg/m    Wt Readings from Last 3 Encounters:  05/05/21 198 lb (89.8 kg)  04/01/20 203 lb (92.1 kg)  02/16/20 204 lb (92.5 kg)    Physical Exam Vitals and nursing note reviewed.  Constitutional:      General: She is not in acute distress.    Appearance: Normal appearance. She is  well-developed and well-groomed. She is obese. She is not ill-appearing, toxic-appearing or diaphoretic.  HENT:     Head: Normocephalic and atraumatic.     Jaw: There is normal jaw occlusion.     Right Ear: Hearing normal.     Left Ear: Hearing normal.     Nose: Nose normal.     Mouth/Throat:     Lips: Pink.     Mouth: Mucous membranes are moist.     Pharynx: Oropharynx is clear. Uvula midline.  Eyes:     General: Lids are normal.     Extraocular Movements: Extraocular movements intact.     Conjunctiva/sclera: Conjunctivae normal.     Pupils: Pupils are equal, round, and reactive to light.  Neck:     Thyroid: No thyroid mass, thyromegaly or thyroid tenderness.     Vascular: No carotid bruit or JVD.     Trachea: Trachea and phonation normal.  Cardiovascular:     Rate and Rhythm: Normal rate and regular rhythm.     Chest Wall: PMI is not displaced.     Pulses: Normal pulses.     Heart sounds: Normal heart sounds. No murmur heard.   No friction rub. No gallop.  Pulmonary:     Effort: Pulmonary effort is normal. No respiratory distress.     Breath sounds: Normal breath sounds. No wheezing.  Abdominal:     General: There is no abdominal bruit.     Palpations: There is no hepatomegaly or splenomegaly.  Musculoskeletal:     Cervical back: Normal range of motion and neck supple.     Right lower leg: No edema.     Left lower leg: No edema.  Lymphadenopathy:     Cervical: No cervical adenopathy.  Skin:    General: Skin is warm and dry.     Capillary Refill: Capillary refill takes less than 2 seconds.     Coloration: Skin is not cyanotic, jaundiced or pale.  Findings: No rash.  Neurological:     General: No focal deficit present.     Mental Status: She is alert and oriented to person, place, and time.     Sensory: Sensation is intact.     Motor: Motor function is intact.     Coordination: Coordination is intact.     Gait: Gait is intact. Gait (uses cane) normal.     Deep  Tendon Reflexes: Reflexes are normal and symmetric.  Psychiatric:        Attention and Perception: Attention and perception normal.        Mood and Affect: Mood and affect normal.        Speech: Speech normal.        Behavior: Behavior normal. Behavior is cooperative.        Thought Content: Thought content normal.        Cognition and Memory: Cognition and memory normal.        Judgment: Judgment normal.    Results for orders placed or performed during the hospital encounter of 02/16/20  Urine Culture   Specimen: Urine, Random  Result Value Ref Range   Specimen Description      URINE, RANDOM Performed at [facility], [address], [place], [place] [address]    Special Requests      NONE Performed at [facility], [address] [place], [place] [address]    Culture (A)     <10,000 COLONIES/mL INSIGNIFICANT GROWTH Performed at [facility], [address], [place], [place] [address]    Report Status 02/17/2020 FINAL   POCT urinalysis dipstick  Result Value Ref Range   Color, UA yellow yellow   Clarity, UA clear clear   Glucose, UA negative negative mg/dL   Bilirubin, UA negative negative   Ketones, POC UA negative negative mg/dL   Spec Grav, UA 8.158 6.483 - 1.025   Blood, UA negative negative   pH, UA 8.5 (A) 5.0 - 8.0   Protein Ur, POC negative negative mg/dL   Urobilinogen, UA 0.2 0.2 or 1.0 E.U./dL   Nitrite, UA Negative Negative   Leukocytes, UA Trace (A) Negative  Cervicovaginal ancillary only  Result Value Ref Range   Bacterial Vaginitis (gardnerella) Negative    Candida Vaginitis Negative    Candida Glabrata Negative    Comment      Normal Reference Range Bacterial Vaginosis - Negative   Comment Normal Reference Range Candida Species - Negative    Comment Normal Reference Range Candida Galbrata - Negative        Pertinent labs & imaging results that were available during my care of the patient were reviewed by me and considered in my  medical decision making.  Assessment & Plan:  Anniah was seen today for establish care.  Diagnoses and all orders for this visit:  HYPERCHOLESTEROLEMIA, PURE Diet encouraged - increase intake of fresh fruits and vegetables, increase intake of lean proteins. Bake, broil, or grill foods. Avoid fried, greasy, and fatty foods. Avoid fast foods. Increase intake of fiber-rich whole grains. Exercise encouraged - at least 150 minutes per week and advance as tolerated.  Statin refilled. Goal BMI < 25. Continue medications as prescribed. -     Lipid panel  BMI 33.0-33.9,adult Diet and exercise encouraged. Labs pending.  -     CBC with Differential/Platelet -     CMP14+EGFR -     Lipid panel -     Thyroid Panel With TSH  HYPERTENSION, BENIGN ESSENTIAL Well controlled. Labs pending.  -     CBC with Differential/Platelet -     CMP14+EGFR -     Lipid panel -     Thyroid Panel With TSH  Gastroesophageal reflux disease without esophagitis Currently not on medications, denies symptomology.  -     CBC with Differential/Platelet  Primary osteoarthritis involving multiple joints Followed by orthopedic on a regular basis.  Binge eating GAD (generalized anxiety disorder) Depression, recurrent ([place]) Followed by psychiatry on a regular basis.  Will check thyroid function today.  Continue medications as prescribed. -     Thyroid Panel With TSH  Health maintenance is not up-to-date and patient aware to make follow-up appointment for complete physical exam with PAP.  Continue all other maintenance medications.  Follow up plan: Return in about 3 months (around 08/03/2021), or if symptoms worsen or fail to improve, for CPE with PAP.   Continue healthy lifestyle choices, including diet (rich in fruits, vegetables, and lean proteins, and low in salt and simple carbohydrates) and exercise (at least 30 minutes of moderate physical activity daily).  Educational handout given for high cholesterol  The  above assessment and management plan was discussed with the patient. The patient verbalized understanding of and has agreed to the management plan. Patient is aware to call the clinic if they develop any new symptoms or if symptoms persist or worsen. Patient is aware when to return to the clinic for a follow-up visit. Patient educated on when it is appropriate to go to the emergency department.   Monia Pouch, FNP-C [facility] [phone]

## 2021-05-06 DIAGNOSIS — M5440 Lumbago with sciatica, unspecified side: Secondary | ICD-10-CM | POA: Diagnosis not present

## 2021-05-06 LAB — CMP14+EGFR
ALT: 30 IU/L (ref 0–32)
AST: 30 IU/L (ref 0–40)
Albumin/Globulin Ratio: 1.4 (ref 1.2–2.2)
Albumin: 3.9 g/dL (ref 3.8–4.9)
Alkaline Phosphatase: 93 IU/L (ref 44–121)
BUN/Creatinine Ratio: 16 (ref 9–23)
BUN: 14 mg/dL (ref 6–24)
Bilirubin Total: 0.5 mg/dL (ref 0.0–1.2)
CO2: 20 mmol/L (ref 20–29)
Calcium: 9.5 mg/dL (ref 8.7–10.2)
Chloride: 109 mmol/L — ABNORMAL HIGH (ref 96–106)
Creatinine, Ser: 0.86 mg/dL (ref 0.57–1.00)
Globulin, Total: 2.8 g/dL (ref 1.5–4.5)
Glucose: 82 mg/dL (ref 70–99)
Potassium: 4.2 mmol/L (ref 3.5–5.2)
Sodium: 144 mmol/L (ref 134–144)
Total Protein: 6.7 g/dL (ref 6.0–8.5)
eGFR: 79 mL/min/{1.73_m2} (ref >59)

## 2021-05-06 LAB — THYROID PANEL WITH TSH
Free Thyroxine Index: 1.6 (ref 1.2–4.9)
T3 Uptake Ratio: 29 % (ref 24–39)
T4, Total: 5.6 ug/dL (ref 4.5–12.0)
TSH: 1.18 u[IU]/mL (ref 0.450–4.500)

## 2021-05-06 LAB — CBC WITH DIFFERENTIAL/PLATELET
Basophils Absolute: 0 10*3/uL (ref 0.0–0.2)
Basos: 1 %
EOS (ABSOLUTE): 0.1 10*3/uL (ref 0.0–0.4)
Eos: 1 %
Hematocrit: 43.3 % (ref 34.0–46.6)
Hemoglobin: 14.5 g/dL (ref 11.1–15.9)
Immature Grans (Abs): 0 10*3/uL (ref 0.0–0.1)
Immature Granulocytes: 0 %
Lymphocytes Absolute: 2.5 10*3/uL (ref 0.7–3.1)
Lymphs: 30 %
MCH: 30.3 pg (ref 26.6–33.0)
MCHC: 33.5 g/dL (ref 31.5–35.7)
MCV: 90 fL (ref 79–97)
Monocytes Absolute: 0.7 10*3/uL (ref 0.1–0.9)
Monocytes: 9 %
Neutrophils Absolute: 4.9 10*3/uL (ref 1.4–7.0)
Neutrophils: 59 %
Platelets: 201 10*3/uL (ref 150–450)
RBC: 4.79 x10E6/uL (ref 3.77–5.28)
RDW: 12.7 % (ref 11.7–15.4)
WBC: 8.2 10*3/uL (ref 3.4–10.8)

## 2021-05-06 LAB — LIPID PANEL
Chol/HDL Ratio: 3.6 ratio (ref 0.0–4.4)
Cholesterol, Total: 165 mg/dL (ref 100–199)
HDL: 46 mg/dL (ref >39)
LDL Chol Calc (NIH): 94 mg/dL (ref 0–99)
Triglycerides: 143 mg/dL (ref 0–149)
VLDL Cholesterol Cal: 25 mg/dL (ref 5–40)

## 2021-05-11 ENCOUNTER — Other Ambulatory Visit: Payer: Self-pay | Admitting: Family Medicine

## 2021-05-11 DIAGNOSIS — Z1231 Encounter for screening mammogram for malignant neoplasm of breast: Secondary | ICD-10-CM

## 2021-05-12 ENCOUNTER — Ambulatory Visit (INDEPENDENT_AMBULATORY_CARE_PROVIDER_SITE_OTHER): Payer: Medicare HMO

## 2021-05-12 ENCOUNTER — Encounter: Payer: Self-pay | Admitting: Family Medicine

## 2021-05-12 VITALS — Ht 64.0 in | Wt 196.0 lb

## 2021-05-12 DIAGNOSIS — Z0000 Encounter for general adult medical examination without abnormal findings: Secondary | ICD-10-CM

## 2021-05-12 NOTE — Progress Notes (Signed)
Subjective:   Cassandra Mcconnell is a 56 y.o. female who presents for an Initial Medicare Annual Wellness Visit.  Virtual Visit via Telephone Note  I connected with  Cassandra Mcconnell on 05/12/21 at  1:15 PM EST by telephone and verified that I am speaking with the correct person using two identifiers.  Location: Patient: Home Provider: [facility] Persons participating in the virtual visit: patient/Nurse Health Advisor   I discussed the limitations, risks, security and privacy concerns of performing an evaluation and management service by telephone and the availability of in person appointments. The patient expressed understanding and agreed to proceed.  Interactive audio and video telecommunications were attempted between this nurse and patient, however failed, due to patient having technical difficulties OR patient did not have access to video capability.  We continued and completed visit with audio only.  Some vital signs may be absent or patient reported.   Cordney Barstow E Arling Cerone, LPN   Review of Systems     Cardiac Risk Factors include: sedentary lifestyle;obesity (BMI >30kg/m2);dyslipidemia;hypertension     Objective:    Today's Vitals   05/12/21 1320  Weight: 196 lb (88.9 kg)  Height: 5\' 4"  (1.626 m)   Body mass index is 33.64 kg/m.  Advanced Directives 05/12/2021 02/01/2019 04/30/2018 04/27/2018 07/30/2017 08/29/2016 04/21/2016  Does Patient Have a Medical Advance Directive? Yes No Yes Yes Yes No No  Type of Paramedic of [place];Living will - [place];Living will [place];Living will [place];Living will - -  Does patient want to make changes to medical advance directive? - - - - - - -  Copy of [place] in Chart? No - copy requested - - - - - -  Would patient like information on creating a medical advance directive? - No - Patient declined - - - No - Patient declined No - Patient declined     Current Medications (verified) Outpatient Encounter Medications as of 05/12/2021  Medication Sig   acetaminophen (TYLENOL) 500 MG tablet Take 1,000 mg by mouth every 6 (six) hours as needed. For pain    atorvastatin (LIPITOR) 20 MG tablet Take 1 tablet (20 mg total) by mouth daily.   citalopram (CELEXA) 20 MG tablet Take 1 tablet (20 mg total) by mouth 2 (two) times a day.   desvenlafaxine (PRISTIQ) 50 MG 24 hr tablet Take 50 mg by mouth at bedtime.   IBUPROFEN PO Take by mouth as needed.   polyvinyl alcohol-povidone (HYPOTEARS) 1.4-0.6 % ophthalmic solution Place 1-2 drops into both eyes daily as needed (for dry eye relief).    promethazine (PHENERGAN) 25 MG tablet Take 1 tablet (25 mg total) by mouth every 8 (eight) hours as needed for nausea or vomiting.   tiZANidine (ZANAFLEX) 4 MG tablet Take 1 tablet (4 mg total) by mouth every 8 (eight) hours as needed for muscle spasms.   topiramate (TOPAMAX) 50 MG tablet SMARTSIG:1 Tablet(s) By Mouth Every Evening   No facility-administered encounter medications on file as of 05/12/2021.    Allergies (verified) Zofran [ondansetron hcl], Azithromycin, Conjugated estrogens, Cyclobenzaprine hcl, Dicyclomine hcl, Hydrocodone, Norco [hydrocodone-acetaminophen], Nsaids, Oxycontin [oxycodone], Percocet [oxycodone-acetaminophen], Robaxin [methocarbamol], Rosuvastatin, Wellbutrin [bupropion hcl], and Ciprofloxacin   History: Past Medical History:  Diagnosis Date   Ankle fracture    left   Anxiety    Back pain    Bell's palsy    Left side, 2009, 2017   Bell's palsy    Depression  Diverticulitis    GERD (gastroesophageal reflux disease)    History of hiatal hernia    Hyperlipidemia    Hypertension    Postoperative nausea 04/21/2016   Toe fracture    left great toe   Wears glasses    Past Surgical History:  Procedure Laterality Date   BACK SURGERY  2004   l4/5 fusion   BREAST REDUCTION SURGERY Bilateral 08/08/2014   Procedure:  BILATERAL BREAST REDUCTION  (BREAST);  Surgeon: Irene Limbo, MD;  Location: [place];  Service: Plastics;  Laterality: Bilateral;   CATARACT EXTRACTION Bilateral    CERVICAL DISCECTOMY  12/12/2018   3 level    CHOLECYSTECTOMY     1998   INJECTION KNEE Left    KNEE ARTHROSCOPY     LAPAROSCOPIC BILATERAL SALPINGO OOPHERECTOMY Bilateral 03/11/2015   Procedure: LAPAROSCOPIC BILATERAL SALPINGO OOPHORECTOMY;  Surgeon: Florian Buff, MD;  Location: [facility];  Service: Gynecology;  Laterality: Bilateral;   LAPAROSCOPIC GASTRIC SLEEVE RESECTION WITH HIATAL HERNIA REPAIR N/A 03/22/2016   Procedure: LAPAROSCOPIC GASTRIC SLEEVE RESECTION WITH HIATAL HERNIA REPAIR WITHUPPER ENDOSCOPY;  Surgeon: Alphonsa Overall, MD;  Location: [facility];  Service: General;  Laterality: N/A;   REDUCTION MAMMAPLASTY Bilateral    SEPTOPLASTY  1988   tonsillectomy  1991   TONSILLECTOMY     Family History  Problem Relation Age of Onset   COPD Mother    Hypertension Mother    GER disease Mother    Heart attack Father    Prostate cancer Father    Hyperlipidemia Brother    Hypertension Brother    Diabetes Brother    Neuropathy Brother    Hyperlipidemia Brother    Hypertension Brother    Heart attack Paternal Grandmother    Heart attack Paternal Grandfather    Stroke Paternal Grandfather    Cancer Paternal Uncle        prostate and bone   Cancer Paternal Uncle        prostate and bone   Colon cancer Neg Hx    Stomach cancer Neg Hx    Social History   Socioeconomic History   Marital status: Married    Spouse name: Not on file   Number of children: 1   Years of education: Not on file   Highest education level: Not on file  Occupational History   Occupation: stay at home mom  Tobacco Use   Smoking status: Never   Smokeless tobacco: Never  Vaping Use   Vaping Use: Never used  Substance and Sexual Activity   Alcohol use: No   Drug use: No   Sexual activity: Not Currently    Partners: Male     Birth control/protection: Post-menopausal, Surgical    Comment: BSO 2016  Other Topics Concern   Not on file  Social History Narrative   Lives at home with her husband   Left handed   Caffeine: 2-4 cups of coffee daily   Social Determinants of Health   Financial Resource Strain: Low Risk    Difficulty of Paying Living Expenses: Not hard at all  Food Insecurity: No Food Insecurity   Worried About Charity fundraiser in the Last Year: Never true   Ran Out of Food in the Last Year: Never true  Transportation Needs: No Transportation Needs   Lack of Transportation (Medical): No   Lack of Transportation (Non-Medical): No  Physical Activity: Inactive   Days of Exercise per Week: 0 days   Minutes of Exercise  per Session: 0 min  Stress: No Stress Concern Present   Feeling of Stress : Not at all  Social Connections: Socially Integrated   Frequency of Communication with Friends and Family: More than three times a week   Frequency of Social Gatherings with Friends and Family: More than three times a week   Attends Religious Services: More than 4 times per year   Active Member of [employer] or Organizations: Yes   Attends Music therapist: More than 4 times per year   Marital Status: Married    Tobacco Counseling Counseling given: Not Answered   Clinical Intake:  Pre-visit preparation completed: Yes  Pain : No/denies pain     BMI - recorded: 33.64 Nutritional Status: BMI > 30  Obese Nutritional Risks: Nausea/ vomitting/ diarrhea Diabetes: No  How often do you need to have someone help you when you read instructions, pamphlets, or other written materials from your doctor or pharmacy?: 1 - Never  Diabetic? no  Interpreter Needed?: No  Information entered by :: Daimion Adamcik, LPN   Activities of Daily Living In your present state of health, do you have any difficulty performing the following activities: 05/12/2021  Hearing? N  Vision? N  Difficulty  concentrating or making decisions? N  Walking or climbing stairs? Y  Dressing or bathing? N  Doing errands, shopping? N  Preparing Food and eating ? N  Using the Toilet? N  In the past six months, have you accidently leaked urine? N  Do you have problems with loss of bowel control? N  Managing your Medications? N  Managing your Finances? N  Housekeeping or managing your Housekeeping? N  Some recent data might be hidden    Patient Care Team: Rakes, Connye Burkitt, FNP as PCP - General (Family Medicine) Irene Limbo, MD as Consulting Physician (Plastic Surgery)  Indicate any recent Medical Services you may have received from other than [facility] providers in the past year (date may be approximate).     Assessment:   This is a routine wellness examination for Cassandra Mcconnell.  Hearing/Vision screen Hearing Screening - Comments:: Denies hearing difficulties  Vision Screening - Comments:: Wears rx glasses - up to date with annual eye exams with [facility] [place] issues and exercise activities discussed: Current Exercise Habits: The patient does not participate in regular exercise at present, Exercise limited by: orthopedic condition(s);neurologic condition(s)   Goals Addressed             This Visit's Progress    Exercise 3x per week (30 min per time)         Depression Screen PHQ 2/9 Scores 05/12/2021 05/05/2021 09/12/2016 05/31/2016 04/13/2016 02/17/2016 05/31/2013  PHQ - 2 Score 0 0 6 0 0 0 6  PHQ- 9 Score 3 3 17  - - - 12    Fall Risk Fall Risk  05/12/2021 05/05/2021 05/09/2018 05/31/2016 04/13/2016  Falls in the past year? 1 1 1  No No  Number falls in past yr: 1 1 1  - -  Injury with Fall? 1 1 1  - -  Risk for fall due to : History of fall(s);Impaired balance/gait;Orthopedic patient - - - -  Follow up Education provided;Falls prevention discussed - - - -    FALL RISK PREVENTION PERTAINING TO THE HOME:  Any stairs in or around the home? Yes  If so, are there any without  handrails? No  Home free of loose throw rugs in walkways, pet beds, electrical cords, etc? Yes  Adequate lighting in your home to reduce risk of falls? Yes   ASSISTIVE DEVICES UTILIZED TO PREVENT FALLS:  Life alert? No  Use of a cane, walker or w/c? Yes  Grab bars in the bathroom? Yes  Shower chair or bench in shower? Yes  Elevated toilet seat or a handicapped toilet? No   TIMED UP AND GO:  Was the test performed? No . Telephonic visit  Cognitive Function: Normal cognitive status assessed by direct observation by this Nurse Health Advisor. No abnormalities found.        6CIT Screen 05/12/2021  What Year? 0 points  What month? 0 points  What time? 0 points  Count back from 20 0 points  Months in reverse 0 points  Repeat phrase 0 points  Total Score 0    Immunizations Immunization History  Administered Date(s) Administered   Influenza,inj,Quad PF,6+ Mos 12/30/2015   Influenza-Unspecified 04/24/2018   PFIZER(Purple Top)SARS-COV-2 Vaccination 09/10/2019, 10/02/2019   PPD Test 11/15/2019   Td 03/03/2008    TDAP status: Due, Education has been provided regarding the importance of this vaccine. Advised may receive this vaccine at local pharmacy or Health Dept. Aware to provide a copy of the vaccination record if obtained from local pharmacy or Health Dept. Verbalized acceptance and understanding.  Flu Vaccine status: Declined, Education has been provided regarding the importance of this vaccine but patient still declined. Advised may receive this vaccine at local pharmacy or Health Dept. Aware to provide a copy of the vaccination record if obtained from local pharmacy or Health Dept. Verbalized acceptance and understanding.  Pneumococcal vaccine status: Due, Education has been provided regarding the importance of this vaccine. Advised may receive this vaccine at local pharmacy or Health Dept. Aware to provide a copy of the vaccination record if obtained from local pharmacy or  Health Dept. Verbalized acceptance and understanding.  Covid-19 vaccine status: Information provided on how to obtain vaccines.   Qualifies for Shingles Vaccine? Yes   Zostavax completed No   Shingrix Completed?: No.    Education has been provided regarding the importance of this vaccine. Patient has been advised to call insurance company to determine out of pocket expense if they have not yet received this vaccine. Advised may also receive vaccine at local pharmacy or Health Dept. Verbalized acceptance and understanding.  Screening Tests Health Maintenance  Topic Date Due   PAP SMEAR-Modifier  12/25/2002   TETANUS/TDAP  03/03/2018   MAMMOGRAM  04/30/2020   INFLUENZA VACCINE  11/23/2020   COVID-19 Vaccine (3 - Booster for Pfizer series) 05/21/2021 (Originally 11/27/2019)   Zoster Vaccines- Shingrix (1 of 2) 08/03/2021 (Originally 08/09/2014)   Hepatitis C Screening  05/05/2022 (Originally 08/09/1982)   COLONOSCOPY (Pts 45-24yrs Insurance coverage will need to be confirmed)  08/07/2024 (Originally 08/08/2009)   HIV Screening  01/11/2049 (Originally 08/09/1979)   Pneumococcal Vaccine 83-3 Years old  Aged Out   HPV VACCINES  Aged Out    Health Maintenance  Health Maintenance Due  Topic Date Due   PAP SMEAR-Modifier  12/25/2002   TETANUS/TDAP  03/03/2018   MAMMOGRAM  04/30/2020   INFLUENZA VACCINE  11/23/2020    Colorectal cancer screening: Type of screening: Cologuard. Completed 2020. Repeat every 3 years  Mammogram status: Ordered 04/2020. Pt provided with contact info and advised to call to schedule appt.   Lung Cancer Screening: (Low Dose CT Chest recommended if Age 68-80 years, 30 pack-year currently smoking OR have quit w/in 15years.) does not qualify.   Additional Screening:  Hepatitis C Screening: does qualify;due  Vision Screening: Recommended annual ophthalmology exams for early detection of glaucoma and other disorders of the eye. Is the patient up to date with their  annual eye exam?  Yes  Who is the provider or what is the name of the office in which the patient attends annual eye exams? [facility] [place] If pt is not established with a provider, would they like to be referred to a provider to establish care? No .   Dental Screening: Recommended annual dental exams for proper oral hygiene  Community Resource Referral / Chronic Care Management: CRR required this visit?  No   CCM required this visit?  No      Plan:     I have personally reviewed and noted the following in the patients chart:   Medical and social history Use of alcohol, tobacco or illicit drugs  Current medications and supplements including opioid prescriptions. Patient is not currently taking opioid prescriptions. Functional ability and status Nutritional status Physical activity Advanced directives List of other physicians Hospitalizations, surgeries, and ER visits in previous 12 months Vitals Screenings to include cognitive, depression, and falls Referrals and appointments  In addition, I have reviewed and discussed with patient certain preventive protocols, quality metrics, and best practice recommendations. A written personalized care plan for preventive services as well as general preventive health recommendations were provided to patient.     Sandrea Hammond, LPN   01/09/9149   Nurse Notes: None

## 2021-05-12 NOTE — Patient Instructions (Signed)
Ms. Ungerer , Thank you for taking time to come for your Medicare Wellness Visit. I appreciate your ongoing commitment to your health goals. Please review the following plan we discussed and let me know if I can assist you in the future.   Screening recommendations/referrals: Colonoscopy: routine cologuard tests with [employer] Mammogram: Keep appointment for 05/31/21 - Repeat annually  Bone Density: Due at age 56 Recommended yearly ophthalmology/optometry visit for glaucoma screening and checkup Recommended yearly dental visit for hygiene and checkup  Vaccinations: Influenza vaccine: Due Pneumococcal vaccine: Due Tdap vaccine: Done 03/03/2008 - Repeat in 10 years *due Shingles vaccine: Due   Covid-19:  Done 09/10/2019 & 10/02/2019  Advanced directives: Please bring a copy of your health care power of attorney and living will to the office to be added to your chart at your convenience.   Conditions/risks identified: Aim for 30 minutes of exercise or brisk walking each day, drink 6-8 glasses of water and eat lots of fruits and vegetables.   Next appointment: Follow up in one year for your annual wellness visit.   Preventive Care 40-64 Years, Female Preventive care refers to lifestyle choices and visits with your health care provider that can promote health and wellness. What does preventive care include? A yearly physical exam. This is also called an annual well check. Dental exams once or twice a year. Routine eye exams. Ask your health care provider how often you should have your eyes checked. Personal lifestyle choices, including: Daily care of your teeth and gums. Regular physical activity. Eating a healthy diet. Avoiding tobacco and drug use. Limiting alcohol use. Practicing safe sex. Taking low-dose aspirin daily starting at age 57. Taking vitamin and mineral supplements as recommended by your health care provider. What happens during an annual well check? The services and  screenings done by your health care provider during your annual well check will depend on your age, overall health, lifestyle risk factors, and family history of disease. Counseling  Your health care provider may ask you questions about your: Alcohol use. Tobacco use. Drug use. Emotional well-being. Home and relationship well-being. Sexual activity. Eating habits. Work and work Statistician. Method of birth control. Menstrual cycle. Pregnancy history. Screening  You may have the following tests or measurements: Height, weight, and BMI. Blood pressure. Lipid and cholesterol levels. These may be checked every 5 years, or more frequently if you are over 76 years old. Skin check. Lung cancer screening. You may have this screening every year starting at age 59 if you have a 30-pack-year history of smoking and currently smoke or have quit within the past 15 years. Fecal occult blood test (FOBT) of the stool. You may have this test every year starting at age 67. Flexible sigmoidoscopy or colonoscopy. You may have a sigmoidoscopy every 5 years or a colonoscopy every 10 years starting at age 23. Hepatitis C blood test. Hepatitis B blood test. Sexually transmitted disease (STD) testing. Diabetes screening. This is done by checking your blood sugar (glucose) after you have not eaten for a while (fasting). You may have this done every 1-3 years. Mammogram. This may be done every 1-2 years. Talk to your health care provider about when you should start having regular mammograms. This may depend on whether you have a family history of breast cancer. BRCA-related cancer screening. This may be done if you have a family history of breast, ovarian, tubal, or peritoneal cancers. Pelvic exam and Pap test. This may be done every 3 years starting at  age 24. Starting at age 4, this may be done every 5 years if you have a Pap test in combination with an HPV test. Bone density scan. This is done to screen for  osteoporosis. You may have this scan if you are at high risk for osteoporosis. Discuss your test results, treatment options, and if necessary, the need for more tests with your health care provider. Vaccines  Your health care provider may recommend certain vaccines, such as: Influenza vaccine. This is recommended every year. Tetanus, diphtheria, and acellular pertussis (Tdap, Td) vaccine. You may need a Td booster every 10 years. Zoster vaccine. You may need this after age 2. Pneumococcal 13-valent conjugate (PCV13) vaccine. You may need this if you have certain conditions and were not previously vaccinated. Pneumococcal polysaccharide (PPSV23) vaccine. You may need one or two doses if you smoke cigarettes or if you have certain conditions. Talk to your health care provider about which screenings and vaccines you need and how often you need them. This information is not intended to replace advice given to you by your health care provider. Make sure you discuss any questions you have with your health care provider. Document Released: 05/08/2015 Document Revised: 12/30/2015 Document Reviewed: 02/10/2015 Elsevier Interactive Patient Education  2017 [place] Prevention in the Home Falls can cause injuries. They can happen to people of all ages. There are many things you can do to make your home safe and to help prevent falls. What can I do on the outside of my home? Regularly fix the edges of walkways and driveways and fix any cracks. Remove anything that might make you trip as you walk through a door, such as a raised step or threshold. Trim any bushes or trees on the path to your home. Use bright outdoor lighting. Clear any walking paths of anything that might make someone trip, such as rocks or tools. Regularly check to see if handrails are loose or broken. Make sure that both sides of any steps have handrails. Any raised decks and porches should have guardrails on the  edges. Have any leaves, snow, or ice cleared regularly. Use sand or salt on walking paths during winter. Clean up any spills in your garage right away. This includes oil or grease spills. What can I do in the bathroom? Use night lights. Install grab bars by the toilet and in the tub and shower. Do not use towel bars as grab bars. Use non-skid mats or decals in the tub or shower. If you need to sit down in the shower, use a plastic, non-slip stool. Keep the floor dry. Clean up any water that spills on the floor as soon as it happens. Remove soap buildup in the tub or shower regularly. Attach bath mats securely with double-sided non-slip rug tape. Do not have throw rugs and other things on the floor that can make you trip. What can I do in the bedroom? Use night lights. Make sure that you have a light by your bed that is easy to reach. Do not use any sheets or blankets that are too big for your bed. They should not hang down onto the floor. Have a firm chair that has side arms. You can use this for support while you get dressed. Do not have throw rugs and other things on the floor that can make you trip. What can I do in the kitchen? Clean up any spills right away. Avoid walking on wet floors. Keep items that  you use a lot in easy-to-reach places. If you need to reach something above you, use a strong step stool that has a grab bar. Keep electrical cords out of the way. Do not use floor polish or wax that makes floors slippery. If you must use wax, use non-skid floor wax. Do not have throw rugs and other things on the floor that can make you trip. What can I do with my stairs? Do not leave any items on the stairs. Make sure that there are handrails on both sides of the stairs and use them. Fix handrails that are broken or loose. Make sure that handrails are as long as the stairways. Check any carpeting to make sure that it is firmly attached to the stairs. Fix any carpet that is loose or  worn. Avoid having throw rugs at the top or bottom of the stairs. If you do have throw rugs, attach them to the floor with carpet tape. Make sure that you have a light switch at the top of the stairs and the bottom of the stairs. If you do not have them, ask someone to add them for you. What else can I do to help prevent falls? Wear shoes that: Do not have high heels. Have rubber bottoms. Are comfortable and fit you well. Are closed at the toe. Do not wear sandals. If you use a stepladder: Make sure that it is fully opened. Do not climb a closed stepladder. Make sure that both sides of the stepladder are locked into place. Ask someone to hold it for you, if possible. Clearly mark and make sure that you can see: Any grab bars or handrails. First and last steps. Where the edge of each step is. Use tools that help you move around (mobility aids) if they are needed. These include: Canes. Walkers. Scooters. Crutches. Turn on the lights when you go into a dark area. Replace any light bulbs as soon as they burn out. Set up your furniture so you have a clear path. Avoid moving your furniture around. If any of your floors are uneven, fix them. If there are any pets around you, be aware of where they are. Review your medicines with your doctor. Some medicines can make you feel dizzy. This can increase your chance of falling. Ask your doctor what other things that you can do to help prevent falls. This information is not intended to replace advice given to you by your health care provider. Make sure you discuss any questions you have with your health care provider. Document Released: 02/05/2009 Document Revised: 09/17/2015 Document Reviewed: 05/16/2014 Elsevier Interactive Patient Education  2017 [employer].

## 2021-05-31 ENCOUNTER — Ambulatory Visit
Admission: RE | Admit: 2021-05-31 | Discharge: 2021-05-31 | Disposition: A | Discharge status: Home / Self Care | Payer: Medicare HMO | Source: Ambulatory Visit | Attending: Family Medicine | Admitting: Family Medicine

## 2021-05-31 DIAGNOSIS — Z1231 Encounter for screening mammogram for malignant neoplasm of breast: Secondary | ICD-10-CM

## 2021-06-04 DIAGNOSIS — Z0100 Encounter for examination of eyes and vision without abnormal findings: Secondary | ICD-10-CM | POA: Diagnosis not present

## 2021-06-09 ENCOUNTER — Encounter: Payer: Self-pay | Admitting: *Deleted

## 2021-06-10 ENCOUNTER — Encounter: Payer: Self-pay | Admitting: Family Medicine

## 2021-06-17 DIAGNOSIS — M5416 Radiculopathy, lumbar region: Secondary | ICD-10-CM | POA: Diagnosis not present

## 2021-06-17 DIAGNOSIS — Z981 Arthrodesis status: Secondary | ICD-10-CM | POA: Diagnosis not present

## 2021-06-18 ENCOUNTER — Encounter: Payer: Self-pay | Admitting: Family Medicine

## 2021-06-29 DIAGNOSIS — M5440 Lumbago with sciatica, unspecified side: Secondary | ICD-10-CM | POA: Diagnosis not present

## 2021-07-02 ENCOUNTER — Encounter: Payer: Self-pay | Admitting: Family Medicine

## 2021-07-07 DIAGNOSIS — F411 Generalized anxiety disorder: Secondary | ICD-10-CM | POA: Diagnosis not present

## 2021-07-07 DIAGNOSIS — F6381 Intermittent explosive disorder: Secondary | ICD-10-CM | POA: Diagnosis not present

## 2021-07-07 DIAGNOSIS — F331 Major depressive disorder, recurrent, moderate: Secondary | ICD-10-CM | POA: Diagnosis not present

## 2021-07-23 DIAGNOSIS — M5440 Lumbago with sciatica, unspecified side: Secondary | ICD-10-CM | POA: Diagnosis not present

## 2021-07-26 ENCOUNTER — Other Ambulatory Visit (HOSPITAL_COMMUNITY): Payer: Self-pay | Admitting: Neurosurgery

## 2021-07-26 ENCOUNTER — Other Ambulatory Visit: Payer: Self-pay | Admitting: Neurosurgery

## 2021-07-26 ENCOUNTER — Other Ambulatory Visit (HOSPITAL_BASED_OUTPATIENT_CLINIC_OR_DEPARTMENT_OTHER): Payer: Self-pay | Admitting: Neurosurgery

## 2021-07-26 DIAGNOSIS — M5440 Lumbago with sciatica, unspecified side: Secondary | ICD-10-CM

## 2021-07-30 ENCOUNTER — Ambulatory Visit
Admission: RE | Admit: 2021-07-30 | Discharge: 2021-07-30 | Disposition: A | Discharge status: Home / Self Care | Payer: Medicare HMO | Source: Ambulatory Visit | Attending: Neurosurgery | Admitting: Neurosurgery

## 2021-07-30 DIAGNOSIS — M5416 Radiculopathy, lumbar region: Secondary | ICD-10-CM | POA: Diagnosis not present

## 2021-07-30 DIAGNOSIS — M5440 Lumbago with sciatica, unspecified side: Secondary | ICD-10-CM

## 2021-07-30 DIAGNOSIS — M5126 Other intervertebral disc displacement, lumbar region: Secondary | ICD-10-CM | POA: Diagnosis not present

## 2021-07-30 DIAGNOSIS — M4326 Fusion of spine, lumbar region: Secondary | ICD-10-CM | POA: Diagnosis not present

## 2021-07-30 MED ORDER — IOPAMIDOL (ISOVUE-M 200) INJECTION 41%
18.0000 mL | Freq: Once | INTRAMUSCULAR | Status: AC
  Administered 2021-07-30: 18 mL via INTRATHECAL

## 2021-07-30 MED ORDER — MEPERIDINE HCL 50 MG/ML IJ SOLN: 50.0000 mg | Freq: Once | INTRAMUSCULAR | Status: DC | PRN

## 2021-07-30 MED ORDER — DIAZEPAM 5 MG PO TABS
10.0000 mg | ORAL_TABLET | Freq: Once | ORAL | Status: AC
  Administered 2021-07-30: 5 mg via ORAL

## 2021-07-30 NOTE — Discharge Instructions (Signed)

## 2021-08-04 ENCOUNTER — Encounter: Payer: Medicare HMO | Admitting: Family Medicine

## 2021-08-05 ENCOUNTER — Encounter: Payer: Medicare HMO | Admitting: Family Medicine

## 2021-08-11 ENCOUNTER — Ambulatory Visit (HOSPITAL_COMMUNITY): Payer: Medicare HMO | Admitting: Physical Therapy

## 2021-08-12 DIAGNOSIS — Z6833 Body mass index (BMI) 33.0-33.9, adult: Secondary | ICD-10-CM | POA: Diagnosis not present

## 2021-08-12 DIAGNOSIS — M5440 Lumbago with sciatica, unspecified side: Secondary | ICD-10-CM | POA: Diagnosis not present

## 2021-08-16 ENCOUNTER — Other Ambulatory Visit: Payer: Self-pay | Admitting: Neurosurgery

## 2021-09-07 NOTE — Pre-Procedure Instructions (Signed)
Surgical Instructions ? ? ? Your procedure is scheduled on Friday 09/17/21. ? ? Report to [facility] Entrance "A" at 05:30 A.M., then check in with the Admitting office. ? Call this number if you have problems the morning of surgery: ? [phone] ? ? If you have any questions prior to your surgery date call [phone]: Open Monday-Friday 8am-4pm ? ? ? Remember: ? Do not eat or drink after midnight the night before your surgery ? ?  ? Take these medicines the morning of surgery with A SIP OF WATER:  ? atorvastatin (LIPITOR) ? citalopram (CELEXA)  ? ? Take these medicines if needed:  ? acetaminophen (TYLENOL)  ? Olopatadine HCl (PATADAY) ? promethazine (PHENERGAN) ? Propylene Glycol (SYSTANE BALANCE) ? tiZANidine (ZANAFLEX) ? ? ?As of today, STOP taking any Aspirin (unless otherwise instructed by your surgeon) Aleve, Naproxen, Ibuprofen, Motrin, Advil, Goody's, BC's, all herbal medications, fish oil, and all vitamins. ? ?         ?Do not wear jewelry or makeup ?Do not wear lotions, powders, perfumes/colognes, or deodorant. ?Do not shave 48 hours prior to surgery.  Men may shave face and neck. ?Do not bring valuables to the hospital. ?Do not wear nail polish, gel polish, artificial nails, or any other type of covering on natural nails (fingers and toes) ?If you have artificial nails or gel coating that need to be removed by a nail salon, please have this removed prior to surgery. Artificial nails or gel coating may interfere with anesthesia's ability to adequately monitor your vital signs. ? ?[facility] is not responsible for any belongings or valuables. .  ? ?Do NOT Smoke (Tobacco/Vaping)  24 hours prior to your procedure ? ?If you use a CPAP at night, you may bring your mask for your overnight stay. ?  ?Contacts, glasses, hearing aids, dentures or partials may not be worn into surgery, please bring cases for these belongings ?  ?For patients admitted to the hospital, discharge time will be determined by your  treatment team. ?  ?Patients discharged the day of surgery will not be allowed to drive home, and someone needs to stay with them for 24 hours. ? ? ?SURGICAL WAITING ROOM VISITATION ?Patients having surgery or a procedure in a hospital may have two support people. ?Children under the age of 41 must have an adult with them who is not the patient. ?They may stay in the waiting area during the procedure and may switch out with other visitors. If the patient needs to stay at the hospital during part of their recovery, the visitor guidelines for inpatient rooms apply. ? ?Please refer to the [facility] website for the visitor guidelines for Inpatients (after your surgery is over and you are in a regular room).  ? ? ? ? ? ?Special instructions:   ? ?Oral Hygiene is also important to reduce your risk of infection.  Remember - BRUSH YOUR TEETH THE MORNING OF SURGERY WITH YOUR REGULAR TOOTHPASTE ? ? ?[facility]- Preparing For Surgery ? ?Before surgery, you can play an important role. Because skin is not sterile, your skin needs to be as free of germs as possible. You can reduce the number of germs on your skin by washing with CHG (chlorahexidine gluconate) Soap before surgery.  CHG is an antiseptic cleaner which kills germs and bonds with the skin to continue killing germs even after washing.   ? ? ?Please do not use if you have an allergy to CHG or antibacterial soaps. If  your skin becomes reddened/irritated stop using the CHG.  ?Do not shave (including legs and underarms) for at least 48 hours prior to first CHG shower. It is OK to shave your face. ? ?Please follow these instructions carefully. ?  ? ? Shower the NIGHT BEFORE SURGERY and the MORNING OF SURGERY with CHG Soap.  ? If you chose to wash your hair, wash your hair first as usual with your normal shampoo. After you shampoo, rinse your hair and body thoroughly to remove the shampoo.  Then [employer] and genitals (private parts) with your normal soap and rinse  thoroughly to remove soap. ? ?After that Use CHG Soap as you would any other liquid soap. You can apply CHG directly to the skin and wash gently with a scrungie or a clean washcloth.  ? ?Apply the CHG Soap to your body ONLY FROM THE NECK DOWN.  Do not use on open wounds or open sores. Avoid contact with your eyes, ears, mouth and genitals (private parts). Wash Face and genitals (private parts)  with your normal soap.  ? ?Wash thoroughly, paying special attention to the area where your surgery will be performed. ? ?Thoroughly rinse your body with warm water from the neck down. ? ?DO NOT shower/wash with your normal soap after using and rinsing off the CHG Soap. ? ?Pat yourself dry with a CLEAN TOWEL. ? ?Wear CLEAN PAJAMAS to bed the night before surgery ? ?Place CLEAN SHEETS on your bed the night before your surgery ? ?DO NOT SLEEP WITH PETS. ? ? ?Day of Surgery: ? ?Take a shower with CHG soap. ?Wear Clean/Comfortable clothing the morning of surgery ?Do not apply any deodorants/lotions.   ?Remember to brush your teeth WITH YOUR REGULAR TOOTHPASTE. ? ? ? ?If you received a COVID test during your pre-op visit, it is requested that you wear a mask when out in public, stay away from anyone that may not be feeling well, and notify your surgeon if you develop symptoms. If you have been in contact with anyone that has tested positive in the last 10 days, please notify your surgeon. ? ?  ?Please read over the following fact sheets that you were given.  ? ?

## 2021-09-08 ENCOUNTER — Encounter (HOSPITAL_COMMUNITY): Payer: Self-pay

## 2021-09-08 ENCOUNTER — Encounter (HOSPITAL_COMMUNITY)
Admission: RE | Admit: 2021-09-08 | Discharge: 2021-09-08 | Disposition: A | Discharge status: Home / Self Care | Payer: Medicare HMO | Source: Ambulatory Visit | Attending: Neurosurgery | Admitting: Neurosurgery

## 2021-09-08 ENCOUNTER — Other Ambulatory Visit: Payer: Self-pay

## 2021-09-08 VITALS — BP 136/83 | HR 72 | Temp 97.9°F | Resp 18 | Ht 64.0 in | Wt 198.0 lb

## 2021-09-08 DIAGNOSIS — M5441 Lumbago with sciatica, right side: Secondary | ICD-10-CM | POA: Diagnosis not present

## 2021-09-08 DIAGNOSIS — Z01818 Encounter for other preprocedural examination: Secondary | ICD-10-CM | POA: Insufficient documentation

## 2021-09-08 HISTORY — DX: Unspecified osteoarthritis, unspecified site: M19.90

## 2021-09-08 HISTORY — DX: Headache, unspecified: R51.9

## 2021-09-08 HISTORY — DX: Dyspnea, unspecified: R06.00

## 2021-09-08 LAB — BASIC METABOLIC PANEL
Anion gap: 8 (ref 5–15)
BUN: 11 mg/dL (ref 6–20)
CO2: 25 mmol/L (ref 22–32)
Calcium: 9.6 mg/dL (ref 8.9–10.3)
Chloride: 105 mmol/L (ref 98–111)
Creatinine, Ser: 0.84 mg/dL (ref 0.44–1.00)
GFR, Estimated: >60 mL/min (ref >60)
Glucose, Bld: 81 mg/dL (ref 70–99)
Potassium: 3.7 mmol/L (ref 3.5–5.1)
Sodium: 138 mmol/L (ref 135–145)

## 2021-09-08 LAB — CBC
HCT: 41.5 % (ref 36.0–46.0)
Hemoglobin: 13.8 g/dL (ref 12.0–15.0)
MCH: 30.3 pg (ref 26.0–34.0)
MCHC: 33.3 g/dL (ref 30.0–36.0)
MCV: 91.2 fL (ref 80.0–100.0)
Platelets: 254 10*3/uL (ref 150–400)
RBC: 4.55 MIL/uL (ref 3.87–5.11)
RDW: 12.6 % (ref 11.5–15.5)
WBC: 8.4 10*3/uL (ref 4.0–10.5)
nRBC: 0 % (ref 0.0–0.2)

## 2021-09-08 LAB — TYPE AND SCREEN
ABO/RH(D): A POS
Antibody Screen: NEGATIVE

## 2021-09-08 LAB — SURGICAL PCR SCREEN
MRSA, PCR: NEGATIVE
Staphylococcus aureus: NEGATIVE

## 2021-09-08 NOTE — Progress Notes (Signed)
PCP -  Darla Lesches, NP ?Cardiologist - denies ? ?PPM/ICD - n/a ? ?Chest x-ray - 04/14/21 ?EKG - 09/08/21 ?Stress Test - denies ?ECHO - denies ?Cardiac Cath - denies ? ?Sleep Study - denies ?CPAP - n/a ? ?Patient denies having diabetes ? ?Blood Thinner Instructions: n/a ?Aspirin Instructions: n/a ? ?ERAS Protcol - No. NPO ? ?COVID TEST- n/a ? ? ?Anesthesia review: No ? ?Patient denies shortness of breath, fever, cough and chest pain at PAT appointment ? ? ?All instructions explained to the patient, with a verbal understanding of the material. Patient agrees to go over the instructions while at home for a better understanding. Patient also instructed to self quarantine after being tested for COVID-19. The opportunity to ask questions was provided. ? ? ?

## 2021-09-17 ENCOUNTER — Encounter (HOSPITAL_COMMUNITY): Payer: Self-pay | Admitting: Neurosurgery

## 2021-09-17 ENCOUNTER — Inpatient Hospital Stay (HOSPITAL_COMMUNITY): Payer: Medicare HMO

## 2021-09-17 ENCOUNTER — Inpatient Hospital Stay (HOSPITAL_COMMUNITY): Payer: Medicare HMO | Admitting: Anesthesiology

## 2021-09-17 ENCOUNTER — Other Ambulatory Visit: Payer: Self-pay

## 2021-09-17 ENCOUNTER — Encounter (HOSPITAL_COMMUNITY)
Admission: RE | Disposition: A | Discharge status: Home / Self Care | Payer: Self-pay | Source: Home / Self Care | Attending: Neurosurgery

## 2021-09-17 ENCOUNTER — Inpatient Hospital Stay (HOSPITAL_COMMUNITY)
Admission: RE | Admit: 2021-09-17 | Discharge: 2021-09-18 | DRG: 455 | Disposition: A | Discharge status: Home / Self Care | Payer: Medicare HMO | Attending: Neurosurgery | Admitting: Neurosurgery

## 2021-09-17 DIAGNOSIS — Z823 Family history of stroke: Secondary | ICD-10-CM | POA: Diagnosis not present

## 2021-09-17 DIAGNOSIS — Z886 Allergy status to analgesic agent status: Secondary | ICD-10-CM

## 2021-09-17 DIAGNOSIS — M545 Low back pain, unspecified: Secondary | ICD-10-CM | POA: Diagnosis not present

## 2021-09-17 DIAGNOSIS — Z79899 Other long term (current) drug therapy: Secondary | ICD-10-CM | POA: Diagnosis not present

## 2021-09-17 DIAGNOSIS — Z833 Family history of diabetes mellitus: Secondary | ICD-10-CM

## 2021-09-17 DIAGNOSIS — M532X6 Spinal instabilities, lumbar region: Secondary | ICD-10-CM

## 2021-09-17 DIAGNOSIS — F32A Depression, unspecified: Secondary | ICD-10-CM | POA: Diagnosis not present

## 2021-09-17 DIAGNOSIS — M5416 Radiculopathy, lumbar region: Secondary | ICD-10-CM | POA: Diagnosis not present

## 2021-09-17 DIAGNOSIS — Z888 Allergy status to other drugs, medicaments and biological substances status: Secondary | ICD-10-CM | POA: Diagnosis not present

## 2021-09-17 DIAGNOSIS — Z8249 Family history of ischemic heart disease and other diseases of the circulatory system: Secondary | ICD-10-CM

## 2021-09-17 DIAGNOSIS — E785 Hyperlipidemia, unspecified: Secondary | ICD-10-CM | POA: Diagnosis not present

## 2021-09-17 DIAGNOSIS — Z9842 Cataract extraction status, left eye: Secondary | ICD-10-CM

## 2021-09-17 DIAGNOSIS — Z882 Allergy status to sulfonamides status: Secondary | ICD-10-CM

## 2021-09-17 DIAGNOSIS — M48061 Spinal stenosis, lumbar region without neurogenic claudication: Secondary | ICD-10-CM | POA: Diagnosis not present

## 2021-09-17 DIAGNOSIS — M4326 Fusion of spine, lumbar region: Secondary | ICD-10-CM | POA: Diagnosis not present

## 2021-09-17 DIAGNOSIS — K219 Gastro-esophageal reflux disease without esophagitis: Secondary | ICD-10-CM | POA: Diagnosis present

## 2021-09-17 DIAGNOSIS — I10 Essential (primary) hypertension: Secondary | ICD-10-CM | POA: Diagnosis present

## 2021-09-17 DIAGNOSIS — F419 Anxiety disorder, unspecified: Secondary | ICD-10-CM | POA: Diagnosis not present

## 2021-09-17 DIAGNOSIS — Z885 Allergy status to narcotic agent status: Secondary | ICD-10-CM | POA: Diagnosis not present

## 2021-09-17 DIAGNOSIS — Z9841 Cataract extraction status, right eye: Secondary | ICD-10-CM

## 2021-09-17 DIAGNOSIS — Z981 Arthrodesis status: Secondary | ICD-10-CM

## 2021-09-17 SURGERY — POSTERIOR LUMBAR FUSION 1 WITH HARDWARE REMOVAL
Anesthesia: General | Site: Back

## 2021-09-17 MED ORDER — THROMBIN 20000 UNITS EX SOLR
CUTANEOUS | Status: AC
  Filled 2021-09-17: qty 20000

## 2021-09-17 MED ORDER — CEFAZOLIN SODIUM-DEXTROSE 2-4 GM/100ML-% IV SOLN
2.0000 g | INTRAVENOUS | Status: AC
  Administered 2021-09-17: 2 g via INTRAVENOUS

## 2021-09-17 MED ORDER — PROPOFOL 1000 MG/100ML IV EMUL
INTRAVENOUS | Status: AC
  Filled 2021-09-17: qty 100

## 2021-09-17 MED ORDER — ACETAMINOPHEN 500 MG PO TABS: 1000.0000 mg | ORAL_TABLET | Freq: Four times a day (QID) | ORAL | Status: DC | PRN

## 2021-09-17 MED ORDER — PROPOFOL 500 MG/50ML IV EMUL
INTRAVENOUS | Status: DC | PRN
  Administered 2021-09-17: 125 ug/kg/min via INTRAVENOUS

## 2021-09-17 MED ORDER — PROPOFOL 10 MG/ML IV BOLUS
INTRAVENOUS | Status: AC
  Filled 2021-09-17: qty 20

## 2021-09-17 MED ORDER — ONDANSETRON HCL 4 MG PO TABS: 4.0000 mg | ORAL_TABLET | Freq: Four times a day (QID) | ORAL | Status: DC | PRN

## 2021-09-17 MED ORDER — LIDOCAINE 2% (20 MG/ML) 5 ML SYRINGE
INTRAMUSCULAR | Status: AC
  Filled 2021-09-17: qty 5

## 2021-09-17 MED ORDER — THROMBIN 20000 UNITS EX SOLR
CUTANEOUS | Status: DC | PRN
  Administered 2021-09-17: 20 mL via TOPICAL

## 2021-09-17 MED ORDER — ACETAMINOPHEN 160 MG/5ML PO SOLN: 1000.0000 mg | Freq: Once | ORAL | Status: DC | PRN

## 2021-09-17 MED ORDER — LIDOCAINE-EPINEPHRINE 1 %-1:100000 IJ SOLN
INTRAMUSCULAR | Status: DC | PRN
  Administered 2021-09-17: 10 mL

## 2021-09-17 MED ORDER — KETAMINE HCL 50 MG/5ML IJ SOSY
PREFILLED_SYRINGE | INTRAMUSCULAR | Status: AC
  Filled 2021-09-17: qty 10

## 2021-09-17 MED ORDER — TIZANIDINE HCL 4 MG PO TABS: 4.0000 mg | ORAL_TABLET | Freq: Three times a day (TID) | ORAL | Status: DC | PRN

## 2021-09-17 MED ORDER — ONDANSETRON HCL 4 MG/2ML IJ SOLN: 4.0000 mg | Freq: Four times a day (QID) | INTRAMUSCULAR | Status: DC | PRN

## 2021-09-17 MED ORDER — MORPHINE SULFATE (PF) 2 MG/ML IV SOLN: 1.0000 mg | INTRAVENOUS | Status: DC | PRN

## 2021-09-17 MED ORDER — ACETAMINOPHEN 650 MG RE SUPP: 650.0000 mg | RECTAL | Status: DC | PRN

## 2021-09-17 MED ORDER — SODIUM CHLORIDE 0.9% FLUSH: 3.0000 mL | INTRAVENOUS | Status: DC | PRN

## 2021-09-17 MED ORDER — OLOPATADINE HCL 0.1 % OP SOLN
1.0000 [drp] | Freq: Two times a day (BID) | OPHTHALMIC | Status: DC
  Administered 2021-09-17 – 2021-09-18 (×3): 1 [drp] via OPHTHALMIC
  Filled 2021-09-17: qty 5

## 2021-09-17 MED ORDER — VENLAFAXINE HCL ER 37.5 MG PO CP24
37.5000 mg | ORAL_CAPSULE | Freq: Every day | ORAL | Status: DC
  Filled 2021-09-17: qty 1

## 2021-09-17 MED ORDER — PROPOFOL 10 MG/ML IV BOLUS
INTRAVENOUS | Status: DC | PRN
  Administered 2021-09-17: 30 mg via INTRAVENOUS
  Administered 2021-09-17: 130 mg via INTRAVENOUS

## 2021-09-17 MED ORDER — PHENYLEPHRINE HCL-NACL 20-0.9 MG/250ML-% IV SOLN
INTRAVENOUS | Status: DC | PRN
  Administered 2021-09-17: 25 ug/min via INTRAVENOUS

## 2021-09-17 MED ORDER — LIDOCAINE 2% (20 MG/ML) 5 ML SYRINGE
INTRAMUSCULAR | Status: DC | PRN
  Administered 2021-09-17: 80 mg via INTRAVENOUS

## 2021-09-17 MED ORDER — PANTOPRAZOLE SODIUM 40 MG IV SOLR
40.0000 mg | Freq: Every day | INTRAVENOUS | Status: DC
  Administered 2021-09-17: 40 mg via INTRAVENOUS
  Filled 2021-09-17: qty 10

## 2021-09-17 MED ORDER — LACTATED RINGERS IV SOLN: INTRAVENOUS | Status: DC

## 2021-09-17 MED ORDER — FENTANYL CITRATE (PF) 250 MCG/5ML IJ SOLN
INTRAMUSCULAR | Status: AC
  Filled 2021-09-17: qty 5

## 2021-09-17 MED ORDER — MIDAZOLAM HCL 2 MG/2ML IJ SOLN
INTRAMUSCULAR | Status: AC
  Filled 2021-09-17: qty 2

## 2021-09-17 MED ORDER — HYDROMORPHONE HCL 2 MG PO TABS
2.0000 mg | ORAL_TABLET | ORAL | Status: DC | PRN
  Administered 2021-09-17 – 2021-09-18 (×6): 2 mg via ORAL
  Filled 2021-09-17 (×6): qty 1

## 2021-09-17 MED ORDER — ATORVASTATIN CALCIUM 10 MG PO TABS
20.0000 mg | ORAL_TABLET | Freq: Every day | ORAL | Status: DC
  Administered 2021-09-18: 20 mg via ORAL
  Filled 2021-09-17: qty 2

## 2021-09-17 MED ORDER — SODIUM CHLORIDE 0.9 % IV SOLN
250.0000 mL | INTRAVENOUS | Status: DC
  Administered 2021-09-17: 250 mL via INTRAVENOUS

## 2021-09-17 MED ORDER — ACETAMINOPHEN 10 MG/ML IV SOLN
INTRAVENOUS | Status: DC | PRN
Start: 2021-09-17 — End: 2021-09-17
  Administered 2021-09-17: 1000 mg via INTRAVENOUS

## 2021-09-17 MED ORDER — SUGAMMADEX SODIUM 200 MG/2ML IV SOLN
INTRAVENOUS | Status: DC | PRN
  Administered 2021-09-17: 200 mg via INTRAVENOUS

## 2021-09-17 MED ORDER — CHLORHEXIDINE GLUCONATE 0.12 % MT SOLN
OROMUCOSAL | Status: AC
  Administered 2021-09-17: 15 mL via OROMUCOSAL
  Filled 2021-09-17: qty 15

## 2021-09-17 MED ORDER — CEFAZOLIN SODIUM-DEXTROSE 2-4 GM/100ML-% IV SOLN
INTRAVENOUS | Status: AC
  Administered 2021-09-17: 2 g via INTRAVENOUS
  Filled 2021-09-17: qty 100

## 2021-09-17 MED ORDER — 0.9 % SODIUM CHLORIDE (POUR BTL) OPTIME
TOPICAL | Status: DC | PRN
  Administered 2021-09-17: 1000 mL

## 2021-09-17 MED ORDER — CHLORHEXIDINE GLUCONATE CLOTH 2 % EX PADS
6.0000 | MEDICATED_PAD | Freq: Once | CUTANEOUS | Status: DC
Start: 2021-09-17 — End: 2021-09-17

## 2021-09-17 MED ORDER — PHENOL 1.4 % MT LIQD: 1.0000 | OROMUCOSAL | Status: DC | PRN

## 2021-09-17 MED ORDER — MIDAZOLAM HCL 5 MG/5ML IJ SOLN
INTRAMUSCULAR | Status: DC | PRN
  Administered 2021-09-17: 2 mg via INTRAVENOUS

## 2021-09-17 MED ORDER — LIDOCAINE-EPINEPHRINE 1 %-1:100000 IJ SOLN
INTRAMUSCULAR | Status: AC
  Filled 2021-09-17: qty 1

## 2021-09-17 MED ORDER — ROCURONIUM BROMIDE 10 MG/ML (PF) SYRINGE
PREFILLED_SYRINGE | INTRAVENOUS | Status: DC | PRN
Start: 2021-09-17 — End: 2021-09-17
  Administered 2021-09-17: 20 mg via INTRAVENOUS
  Administered 2021-09-17: 100 mg via INTRAVENOUS

## 2021-09-17 MED ORDER — CITALOPRAM HYDROBROMIDE 20 MG PO TABS
20.0000 mg | ORAL_TABLET | Freq: Every day | ORAL | Status: DC
  Administered 2021-09-18: 20 mg via ORAL
  Filled 2021-09-17: qty 1

## 2021-09-17 MED ORDER — CHLORHEXIDINE GLUCONATE 0.12 % MT SOLN: 15.0000 mL | Freq: Once | OROMUCOSAL | Status: AC

## 2021-09-17 MED ORDER — BUPIVACAINE LIPOSOME 1.3 % IJ SUSP
INTRAMUSCULAR | Status: DC | PRN
  Administered 2021-09-17: 20 mL

## 2021-09-17 MED ORDER — ACETAMINOPHEN 325 MG PO TABS
650.0000 mg | ORAL_TABLET | ORAL | Status: DC | PRN
  Administered 2021-09-18: 650 mg via ORAL
  Filled 2021-09-17: qty 2

## 2021-09-17 MED ORDER — BUPIVACAINE LIPOSOME 1.3 % IJ SUSP
INTRAMUSCULAR | Status: AC
  Filled 2021-09-17: qty 20

## 2021-09-17 MED ORDER — DEXAMETHASONE SODIUM PHOSPHATE 10 MG/ML IJ SOLN
INTRAMUSCULAR | Status: AC
  Filled 2021-09-17: qty 1

## 2021-09-17 MED ORDER — MENTHOL 3 MG MT LOZG: 1.0000 | LOZENGE | OROMUCOSAL | Status: DC | PRN

## 2021-09-17 MED ORDER — KETAMINE HCL 10 MG/ML IJ SOLN
INTRAMUSCULAR | Status: DC | PRN
  Administered 2021-09-17: 40 mg via INTRAVENOUS
  Administered 2021-09-17 (×3): 10 mg via INTRAVENOUS

## 2021-09-17 MED ORDER — ACETAMINOPHEN 10 MG/ML IV SOLN
INTRAVENOUS | Status: AC
  Filled 2021-09-17: qty 100

## 2021-09-17 MED ORDER — HYDROMORPHONE HCL 1 MG/ML IJ SOLN
0.5000 mg | INTRAMUSCULAR | Status: DC | PRN
  Administered 2021-09-17 – 2021-09-18 (×4): 0.5 mg via INTRAVENOUS
  Filled 2021-09-17 (×4): qty 0.5

## 2021-09-17 MED ORDER — DEXAMETHASONE SODIUM PHOSPHATE 10 MG/ML IJ SOLN
INTRAMUSCULAR | Status: DC | PRN
  Administered 2021-09-17: 10 mg via INTRAVENOUS

## 2021-09-17 MED ORDER — FENTANYL CITRATE (PF) 250 MCG/5ML IJ SOLN
INTRAMUSCULAR | Status: DC | PRN
  Administered 2021-09-17 (×3): 50 ug via INTRAVENOUS
  Administered 2021-09-17: 100 ug via INTRAVENOUS

## 2021-09-17 MED ORDER — ORAL CARE MOUTH RINSE: 15.0000 mL | Freq: Once | OROMUCOSAL | Status: AC

## 2021-09-17 MED ORDER — POLYVINYL ALCOHOL 1.4 % OP SOLN: 1.0000 [drp] | OPHTHALMIC | Status: DC | PRN

## 2021-09-17 MED ORDER — ROCURONIUM BROMIDE 10 MG/ML (PF) SYRINGE
PREFILLED_SYRINGE | INTRAVENOUS | Status: AC
  Filled 2021-09-17: qty 10

## 2021-09-17 MED ORDER — ALUM & MAG HYDROXIDE-SIMETH 200-200-20 MG/5ML PO SUSP: 30.0000 mL | Freq: Four times a day (QID) | ORAL | Status: DC | PRN

## 2021-09-17 MED ORDER — CYCLOBENZAPRINE HCL 10 MG PO TABS: 10.0000 mg | ORAL_TABLET | Freq: Three times a day (TID) | ORAL | Status: DC | PRN

## 2021-09-17 MED ORDER — CHLORHEXIDINE GLUCONATE CLOTH 2 % EX PADS: 6.0000 | MEDICATED_PAD | Freq: Once | CUTANEOUS | Status: DC

## 2021-09-17 MED ORDER — BUPIVACAINE HCL (PF) 0.25 % IJ SOLN
INTRAMUSCULAR | Status: AC
  Filled 2021-09-17: qty 30

## 2021-09-17 MED ORDER — PROMETHAZINE HCL 25 MG PO TABS: 25.0000 mg | ORAL_TABLET | Freq: Three times a day (TID) | ORAL | Status: DC | PRN

## 2021-09-17 MED ORDER — SODIUM CHLORIDE 0.9% FLUSH
3.0000 mL | Freq: Two times a day (BID) | INTRAVENOUS | Status: DC
  Administered 2021-09-17 – 2021-09-18 (×3): 3 mL via INTRAVENOUS

## 2021-09-17 MED ORDER — PROPYLENE GLYCOL 0.6 % OP SOLN: 1.0000 [drp] | Freq: Every day | OPHTHALMIC | Status: DC | PRN

## 2021-09-17 MED ORDER — AMISULPRIDE (ANTIEMETIC) 5 MG/2ML IV SOLN
INTRAVENOUS | Status: DC | PRN
  Administered 2021-09-17: 5 mg via INTRAVENOUS

## 2021-09-17 MED ORDER — ACETAMINOPHEN 500 MG PO TABS: 1000.0000 mg | ORAL_TABLET | Freq: Once | ORAL | Status: DC | PRN

## 2021-09-17 MED ORDER — CEFAZOLIN SODIUM-DEXTROSE 2-4 GM/100ML-% IV SOLN
2.0000 g | Freq: Three times a day (TID) | INTRAVENOUS | Status: AC
  Administered 2021-09-17: 2 g via INTRAVENOUS
  Filled 2021-09-17 (×2): qty 100

## 2021-09-17 MED ORDER — ACETAMINOPHEN 10 MG/ML IV SOLN: 1000.0000 mg | Freq: Once | INTRAVENOUS | Status: DC | PRN

## 2021-09-17 SURGICAL SUPPLY — 86 items
ADH SKN CLS APL DERMABOND .7 (GAUZE/BANDAGES/DRESSINGS) ×1
APL SKNCLS STERI-STRIP NONHPOA (GAUZE/BANDAGES/DRESSINGS) ×1
BAG COUNTER SPONGE SURGICOUNT (BAG) ×3 IMPLANT
BAG SPNG CNTER NS LX DISP (BAG) ×1
BASKET BONE COLLECTION (BASKET) ×2 IMPLANT
BENZOIN TINCTURE PRP APPL 2/3 (GAUZE/BANDAGES/DRESSINGS) ×3 IMPLANT
BLADE BONE MILL MEDIUM (MISCELLANEOUS) ×1 IMPLANT
BLADE CLIPPER SURG (BLADE) IMPLANT
BLADE SURG 11 STRL SS (BLADE) ×3 IMPLANT
BONE VIVIGEN FORMABLE 5.4CC (Bone Implant) ×2 IMPLANT
BUR CUTTER 7.0 ROUND (BURR) ×3 IMPLANT
BUR MATCHSTICK NEURO 3.0 LAGG (BURR) ×3 IMPLANT
CANISTER SUCT 3000ML PPV (MISCELLANEOUS) ×3 IMPLANT
CAP LOCK DLX THRD (Cap) ×2 IMPLANT
CARTRIDGE OIL MAESTRO DRILL (MISCELLANEOUS) ×2 IMPLANT
CNTNR URN SCR LID CUP LEK RST (MISCELLANEOUS) ×2 IMPLANT
CONT SPEC 4OZ STRL OR WHT (MISCELLANEOUS) ×2
COVER BACK TABLE 60X90IN (DRAPES) ×3 IMPLANT
DECANTER SPIKE VIAL GLASS SM (MISCELLANEOUS) ×2 IMPLANT
DERMABOND ADVANCED (GAUZE/BANDAGES/DRESSINGS) ×1
DERMABOND ADVANCED .7 DNX12 (GAUZE/BANDAGES/DRESSINGS) ×2 IMPLANT
DIFFUSER DRILL AIR PNEUMATIC (MISCELLANEOUS) ×3 IMPLANT
DRAPE C-ARM 42X72 X-RAY (DRAPES) ×6 IMPLANT
DRAPE C-ARMOR (DRAPES) IMPLANT
DRAPE HALF SHEET 40X57 (DRAPES) IMPLANT
DRAPE LAPAROTOMY 100X72X124 (DRAPES) ×3 IMPLANT
DRAPE SURG 17X23 STRL (DRAPES) ×3 IMPLANT
DRSG OPSITE 4X5.5 SM (GAUZE/BANDAGES/DRESSINGS) ×3 IMPLANT
DRSG OPSITE POSTOP 4X6 (GAUZE/BANDAGES/DRESSINGS) ×3 IMPLANT
DURAPREP 26ML APPLICATOR (WOUND CARE) ×3 IMPLANT
ELECT REM PT RETURN 9FT ADLT (ELECTROSURGICAL) ×2
ELECTRODE REM PT RTRN 9FT ADLT (ELECTROSURGICAL) ×2 IMPLANT
EVACUATOR 1/8 PVC DRAIN (DRAIN) ×1 IMPLANT
EVACUATOR 3/16  PVC DRAIN (DRAIN)
EVACUATOR 3/16 PVC DRAIN (DRAIN) ×2 IMPLANT
GAUZE 4X4 16PLY ~~LOC~~+RFID DBL (SPONGE) IMPLANT
GAUZE SPONGE 4X4 12PLY STRL (GAUZE/BANDAGES/DRESSINGS) ×3 IMPLANT
GLOVE BIO SURGEON STRL SZ7 (GLOVE) ×1 IMPLANT
GLOVE BIO SURGEON STRL SZ8 (GLOVE) ×6 IMPLANT
GLOVE BIOGEL PI IND STRL 6.5 (GLOVE) IMPLANT
GLOVE BIOGEL PI IND STRL 7.0 (GLOVE) IMPLANT
GLOVE BIOGEL PI IND STRL 7.5 (GLOVE) IMPLANT
GLOVE BIOGEL PI INDICATOR 6.5 (GLOVE) ×2
GLOVE BIOGEL PI INDICATOR 7.0 (GLOVE)
GLOVE BIOGEL PI INDICATOR 7.5 (GLOVE) ×2
GLOVE EXAM NITRILE XL STR (GLOVE) IMPLANT
GLOVE INDICATOR 8.5 STRL (GLOVE) ×6 IMPLANT
GLOVE SURG SS PI 6.5 STRL IVOR (GLOVE) ×2 IMPLANT
GLOVE SURG UNDER POLY LF SZ8.5 (GLOVE) ×6 IMPLANT
GOWN STRL REUS W/ TWL LRG LVL3 (GOWN DISPOSABLE) IMPLANT
GOWN STRL REUS W/ TWL XL LVL3 (GOWN DISPOSABLE) ×4 IMPLANT
GOWN STRL REUS W/TWL 2XL LVL3 (GOWN DISPOSABLE) IMPLANT
GOWN STRL REUS W/TWL LRG LVL3 (GOWN DISPOSABLE) ×4
GOWN STRL REUS W/TWL XL LVL3 (GOWN DISPOSABLE) ×4
GRAFT BNE MATRIX VG FRMBL MD 5 (Bone Implant) IMPLANT
GRAFT BONE PROTEIOS LRG 5CC (Orthopedic Implant) ×1 IMPLANT
KIT BASIN OR (CUSTOM PROCEDURE TRAY) ×3 IMPLANT
KIT GRAFTMAG DEL NEURO DISP (NEUROSURGERY SUPPLIES) IMPLANT
KIT TURNOVER KIT B (KITS) ×3 IMPLANT
MILL BONE PREP (MISCELLANEOUS) ×3 IMPLANT
NDL HYPO 21X1.5 SAFETY (NEEDLE) ×2 IMPLANT
NDL HYPO 25X1 1.5 SAFETY (NEEDLE) ×2 IMPLANT
NEEDLE HYPO 21X1.5 SAFETY (NEEDLE) ×2 IMPLANT
NEEDLE HYPO 25X1 1.5 SAFETY (NEEDLE) ×2 IMPLANT
NS IRRIG 1000ML POUR BTL (IV SOLUTION) ×4 IMPLANT
OIL CARTRIDGE MAESTRO DRILL (MISCELLANEOUS) ×2
PACK LAMINECTOMY NEURO (CUSTOM PROCEDURE TRAY) ×3 IMPLANT
PAD ARMBOARD 7.5X6 YLW CONV (MISCELLANEOUS) ×9 IMPLANT
ROD CURVED TI 6.35X45 (Rod) ×1 IMPLANT
ROD STRT TI 6.35X55 (Rod) ×1 IMPLANT
SCREW DANEK NONBREAK (Screw) ×2 IMPLANT
SCREW PA CREO DLX 6.5X45 (Screw) ×2 IMPLANT
SPACER SUSTAIN TI 9X22X12 8D (Spacer) ×2 IMPLANT
SPONGE SURGIFOAM ABS GEL 100 (HEMOSTASIS) ×3 IMPLANT
SPONGE T-LAP 4X18 ~~LOC~~+RFID (SPONGE) IMPLANT
STRIP CLOSURE SKIN 1/2X4 (GAUZE/BANDAGES/DRESSINGS) ×5 IMPLANT
SUT VIC AB 0 CT1 18XCR BRD8 (SUTURE) ×4 IMPLANT
SUT VIC AB 0 CT1 8-18 (SUTURE) ×2
SUT VIC AB 2-0 CT1 18 (SUTURE) ×3 IMPLANT
SUT VIC AB 4-0 PS2 27 (SUTURE) ×3 IMPLANT
SYR 20ML LL LF (SYRINGE) IMPLANT
TOWEL GREEN STERILE (TOWEL DISPOSABLE) ×3 IMPLANT
TOWEL GREEN STERILE FF (TOWEL DISPOSABLE) ×3 IMPLANT
TRAY FOLEY MTR SLVR 14FR STAT (SET/KITS/TRAYS/PACK) ×1 IMPLANT
TRAY FOLEY MTR SLVR 16FR STAT (SET/KITS/TRAYS/PACK) ×2 IMPLANT
WATER STERILE IRR 1000ML POUR (IV SOLUTION) ×3 IMPLANT

## 2021-09-17 NOTE — Progress Notes (Addendum)
Pt. Foley removed, urine yellow and cloudy with sediment. Pt. Walked to bathroom with assistance and voided In commode.

## 2021-09-17 NOTE — Transfer of Care (Signed)
Immediate Anesthesia Transfer of Care Note  Patient: Cassandra Mcconnell  Procedure(s) Performed: Posterior Lumbar Interbody Fusion  - Lumbar three-Lumbar four extension removal of hardware - Posterior Lateral and Interbody fusion (Back)  Patient Location: PACU  Anesthesia Type:General  Level of Consciousness: drowsy and patient cooperative  Airway & Oxygen Therapy: Patient Spontanous Breathing and Patient connected to nasal cannula oxygen  Post-op Assessment: Report given to RN and Post -op Vital signs reviewed and stable  Post vital signs: Reviewed and stable  Last Vitals:  Vitals Value Taken Time  BP 142/85 09/17/21 1052  Temp    Pulse 79 09/17/21 1054  Resp 14 09/17/21 1054  SpO2 91 % 09/17/21 1054  Vitals shown include unvalidated device data.  Last Pain:  Vitals:   09/17/21 0602  PainSc: 8       Patients Stated Pain Goal: 3 (97/[id number])  Complications: No notable events documented.

## 2021-09-17 NOTE — Anesthesia Procedure Notes (Signed)
Procedure Name: Intubation Date/Time: 09/17/2021 7:47 AM Performed by: Kyung Rudd, CRNA Pre-anesthesia Checklist: Patient identified, Emergency Drugs available, Suction available and Patient being monitored Patient Re-evaluated:Patient Re-evaluated prior to induction Oxygen Delivery Method: Circle system utilized Preoxygenation: Pre-oxygenation with 100% oxygen Induction Type: IV induction Ventilation: Mask ventilation without difficulty and Oral airway inserted - appropriate to patient size Laryngoscope Size: Mac and 4 Grade View: Grade I Tube type: Oral Tube size: 7.0 mm Number of attempts: 1 Airway Equipment and Method: Stylet Placement Confirmation: ETT inserted through vocal cords under direct vision, positive ETCO2 and breath sounds checked- equal and bilateral Secured at: 21 cm Tube secured with: Tape Dental Injury: Teeth and Oropharynx as per pre-operative assessment

## 2021-09-17 NOTE — Anesthesia Preprocedure Evaluation (Signed)
Anesthesia Evaluation  Patient identified by MRN, date of birth, ID band Patient awake    Reviewed: Allergy & Precautions, NPO status , Patient's Chart, lab work & pertinent test results  History of Anesthesia Complications (+) PONV and history of anesthetic complications  Airway Mallampati: II  TM Distance: >3 FB Neck ROM: Full    Dental  (+) Teeth Intact, Dental Advisory Given   Pulmonary shortness of breath,    breath sounds clear to auscultation       Cardiovascular hypertension,  Rhythm:Regular     Neuro/Psych  Headaches, PSYCHIATRIC DISORDERS Anxiety Depression  Neuromuscular disease    GI/Hepatic Neg liver ROS, hiatal hernia, GERD  ,Gastric bypass   Endo/Other  negative endocrine ROS  Renal/GU negative Renal ROS     Musculoskeletal  (+) Arthritis ,   Abdominal   Peds  Hematology negative hematology ROS (+) Lab Results      Component                Value               Date                      WBC                      8.4                 09/08/2021                HGB                      13.8                09/08/2021                HCT                      41.5                09/08/2021                MCV                      91.2                09/08/2021                PLT                      254                 09/08/2021              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  Reproductive/Obstetrics                             Anesthesia Physical Anesthesia Plan  ASA: 2  Anesthesia Plan: General   Post-op Pain Management: Ketamine IV* and Ofirmev IV (intra-op)*   Induction: Intravenous  PONV Risk Score and Plan: 4 or greater and Dexamethasone, TIVA, Propofol infusion, Midazolam and Amisulpride  Airway Management Planned: Oral ETT  Additional Equipment: None  Intra-op Plan:   Post-operative Plan: Extubation in OR  Informed Consent: I have reviewed the  patients History and Physical, chart, labs and discussed the procedure including  the risks, benefits and alternatives for the proposed anesthesia with the patient or authorized representative who has indicated his/her understanding and acceptance.     Dental advisory given  Plan Discussed with: CRNA  Anesthesia Plan Comments:         Anesthesia Quick Evaluation

## 2021-09-17 NOTE — Op Note (Signed)
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  Postoperative diagnosis: Same  Procedure: Expiration fusion move of hardware L4-5.    2.decompressive laminectomy interbody fusion with complete medial facetectomies erotic foraminotomies of the L3 and L4 nerve roots in excess and requiring more work than would be needed with a standard interbody fusion.  3.  Posterior lumbar interbody fusion utilizing the titanium insert and rotate globus ages packed with locally harvested autograft mixed with Vivigen and Proteus  4.  Pedicle screw fixation L3-4 with removal of old Medtronic M 10 kn and old Medtronic L5 pedicle screws retention of Medtronic L4 pedicle screws placement of bilateral globus Creo D LX pedicle screws at L3 utilization of globus rods Medtronic M10 caps and globus For the globus screws.  5.  Posterior lateral arthrodesis L3-4 utilizing the locally harvested autograft  Surgeon: Dominica Severin Karma Ansley  Assistant: Nash Shearer  Anesthesia: General  EBL: Minimal  HPI: Cassandra Mcconnell 56 year old female with longstanding back pain previously undergone an L4-5 fusion with transitional anatomy at L5-S1.  Patient has had progressive worsening back and bilateral hip and leg pain work-up is revealed segmental degeneration at L3-4 above her previous fusion.  Patient failed all forms conservative treatment and recommended decompressive laminectomy interbody fusion at that level.  We extensively reviewed the risks and benefits of the operation with her as well as perioperative course expectations of outcome and alternatives of surgery and she understood and agreed to proceed forward.  Operative procedure: Patient was brought into the OR was [facility] general anesthesia positioned prone the Wilson frame her back was prepped and draped in routine sterile fashion.  Her old incision was opened up and extended cephalad scar tissue was dissected free and  subperiosteal dissection carried lamina of L2 and L3 exposing the hardware at L4-5.  I then remove the spinous process at L3 performed a central decompression.  Working through the scar and freed up the medial facet complex and did complete medial facetectomy with radical foraminotomies of the L3 and L4 nerve roots.  Aggressive interbody and superior tickling facet gained access lateral margin disc base.  Disc base was cleaned out bilaterally endplates were prepared cages were then inserted after being packed with locally harvested autograft mixed with an extensive mount of autograft mix centrally.  Under fluoroscopy all implants were confirmed to be in good position and placed 2 pedicle screws at L3 also confirmed to be in good position.  I then disconnected the knots and rods from the old Medtronic M10 system removed the L5 screws placed new rods connected up compressed L3 against L4 utilizing M10 intact caps at L4 and globus at L3.  I then aggressively decorticated the TPs lateral facet joints and packed an extensive mount of autograft posterior laterally from L3-4.  Reinspected the foramina to confirm patency lay Gelfoam in the dura placed a medium Hemovac drain injected Exparel in the fascia and closed the wound in layers with interrupted Vicryl.  Dermabond benzoin Steri-Strips and a sterile dressing was applied patient recovery in stable condition.  At the end the case all needle counts and sponge counts were correct.

## 2021-09-17 NOTE — Anesthesia Postprocedure Evaluation (Signed)
Anesthesia Post Note  Patient: Cassandra Mcconnell  Procedure(s) Performed: Posterior Lumbar Interbody Fusion  - Lumbar three-Lumbar four extension removal of hardware - Posterior Lateral and Interbody fusion (Back)     Patient location during evaluation: PACU Anesthesia Type: General Level of consciousness: awake and alert Pain management: pain level controlled Vital Signs Assessment: post-procedure vital signs reviewed and stable Respiratory status: spontaneous breathing, nonlabored ventilation, respiratory function stable and patient connected to nasal cannula oxygen Cardiovascular status: blood pressure returned to baseline and stable Postop Assessment: no apparent nausea or vomiting Anesthetic complications: no   No notable events documented.  Last Vitals:  Vitals:   09/17/21 1138 09/17/21 1158  BP: 127/86 101/65  Pulse: 87 79  Resp: 14 14  Temp:  37 C  SpO2: 94% 94%    Last Pain:  Vitals:   09/17/21 1158  TempSrc: Oral  PainSc: 4                  Steadman Prosperi

## 2021-09-17 NOTE — H&P (Signed)
Cassandra Mcconnell is an 56 y.o. female.   Chief Complaint: Back bilateral hip and leg pain HPI: 56 year old female with longstanding back pain previously undergone an L4-5 fusion.  Patient has transitional anatomy and the disc base on the transitional segment has been designated L5-S1 and she has been fused already the level above that L4-5 she has had segmental breakdown above that at L3-4 with severe foraminal stenosis she is failed all forms conservative treatment and due to progression of clinical syndrome imaging findings and failed conservative treatment I recommended extension of her fusion decompression at L3-4 I extensively gone over the risks and benefits of that operation with her as well as perioperative course expectations of outcome and alternatives of surgery and she understood and agreed to proceed forward.  Past Medical History:  Diagnosis Date   Ankle fracture    left   Anxiety    Arthritis    Back pain    Bell's palsy    Left side, 2009, 2017   Bell's palsy    Depression    Diverticulitis    Dyspnea    GERD (gastroesophageal reflux disease)    Headache    History of hiatal hernia    Hyperlipidemia    Hypertension    Postoperative nausea 04/21/2016   Toe fracture    left great toe   Wears glasses     Past Surgical History:  Procedure Laterality Date   BACK SURGERY  2004   l4/5 fusion   BREAST REDUCTION SURGERY Bilateral 08/08/2014   Procedure: BILATERAL BREAST REDUCTION  (BREAST);  Surgeon: Irene Limbo, MD;  Location: [place];  Service: Plastics;  Laterality: Bilateral;   CATARACT EXTRACTION Bilateral    CERVICAL DISCECTOMY  12/12/2018   3 level    CHOLECYSTECTOMY     1998   INJECTION KNEE Left    KNEE ARTHROSCOPY     LAPAROSCOPIC BILATERAL SALPINGO OOPHERECTOMY Bilateral 03/11/2015   Procedure: LAPAROSCOPIC BILATERAL SALPINGO OOPHORECTOMY;  Surgeon: Florian Buff, MD;  Location: [facility];  Service: Gynecology;  Laterality: Bilateral;    LAPAROSCOPIC GASTRIC SLEEVE RESECTION WITH HIATAL HERNIA REPAIR N/A 03/22/2016   Procedure: LAPAROSCOPIC GASTRIC SLEEVE RESECTION WITH HIATAL HERNIA REPAIR WITHUPPER ENDOSCOPY;  Surgeon: Alphonsa Overall, MD;  Location: [facility];  Service: General;  Laterality: N/A;   REDUCTION MAMMAPLASTY Bilateral    SEPTOPLASTY  1988   tonsillectomy  1991   TONSILLECTOMY      Family History  Problem Relation Age of Onset   COPD Mother    Hypertension Mother    GER disease Mother    Heart attack Father    Prostate cancer Father    Hyperlipidemia Brother    Hypertension Brother    Diabetes Brother    Neuropathy Brother    Hyperlipidemia Brother    Hypertension Brother    Heart attack Paternal Grandmother    Heart attack Paternal Grandfather    Stroke Paternal Grandfather    Cancer Paternal Uncle        prostate and bone   Cancer Paternal Uncle        prostate and bone   Colon cancer Neg Hx    Stomach cancer Neg Hx    Social History:  reports that she has never smoked. She has never used smokeless tobacco. She reports that she does not drink alcohol and does not use drugs.  Allergies:  Allergies  Allergen Reactions   Zofran [Ondansetron Hcl] Shortness Of Breath and Rash  Numbness   Azithromycin Swelling   Conjugated Estrogens Other (See Comments)    emotional   Cyclobenzaprine Hcl     REACTION: disorientation   Hydrocodone     Other reaction(s): Unknown   Norco [Hydrocodone-Acetaminophen] Other (See Comments)    Felt like she was going through withdrawal   Nsaids Other (See Comments)    Cannot have because of bariatric surgery   Oxycontin [Oxycodone]     Withdrawal feeling    Percocet [Oxycodone-Acetaminophen]     Hypotension    Robaxin [Methocarbamol]     Pt does not remember reaction    Rosuvastatin Dermatitis   Sulfa Antibiotics Hives and Itching   Wellbutrin [Bupropion Hcl] Other (See Comments)    Emotional, makes depression worse   Ciprofloxacin Itching and Rash    Dicyclomine Hcl Itching and Rash    Medications Prior to Admission  Medication Sig Dispense Refill   atorvastatin (LIPITOR) 20 MG tablet Take 1 tablet (20 mg total) by mouth daily. 90 tablet 1   citalopram (CELEXA) 20 MG tablet Take 1 tablet (20 mg total) by mouth 2 (two) times a day. (Patient taking differently: Take 20 mg by mouth daily.) 180 tablet 3   desvenlafaxine (PRISTIQ) 50 MG 24 hr tablet Take 50 mg by mouth at bedtime.     Olopatadine HCl 0.2 % SOLN Place 1 drop into both eyes daily as needed (allergies).     Propylene Glycol (SYSTANE BALANCE) 0.6 % SOLN Apply 1 drop to eye daily as needed (dry eyes).     tiZANidine (ZANAFLEX) 4 MG tablet Take 1 tablet (4 mg total) by mouth every 8 (eight) hours as needed for muscle spasms. 21 tablet 0   acetaminophen (TYLENOL) 500 MG tablet Take 1,000 mg by mouth every 6 (six) hours as needed. For pain      ibuprofen (ADVIL) 200 MG tablet Take 800-1,200 mg by mouth every 8 (eight) hours as needed.     promethazine (PHENERGAN) 25 MG tablet Take 1 tablet (25 mg total) by mouth every 8 (eight) hours as needed for nausea or vomiting. 20 tablet 0    No results found for this or any previous visit (from the past 48 hour(s)). No results found.  Review of Systems  Musculoskeletal:  Positive for back pain.  Neurological:  Positive for numbness.   Blood pressure 124/78, pulse 72, temperature 98.1 F (36.7 C), resp. rate 17, height '5\' 4"'$  (1.626 m), weight 88.9 kg, last menstrual period 02/10/2015, SpO2 98 %. Physical Exam HENT:     Head: Normocephalic.     Right Ear: Tympanic membrane normal.     Nose: Nose normal.     Mouth/Throat:     Mouth: Mucous membranes are moist.  Cardiovascular:     Rate and Rhythm: Normal rate.  Pulmonary:     Effort: Pulmonary effort is normal.  Abdominal:     General: Abdomen is flat.  Musculoskeletal:        General: Normal range of motion.  Skin:    General: Skin is warm.  Neurological:     General: No focal  deficit present.     Mental Status: She is alert.     Comments: Patient is awake and alert strength is 5 out of 5 iliopsoas, quads, hamstrings, gastrocs, into tibialis, and EHL.     Assessment/Plan 56 year old presents for decompression interbody fusion L3-4  Cassandra Hoops, MD 09/17/2021, 7:25 AM

## 2021-09-17 NOTE — Evaluation (Signed)
Physical Therapy Evaluation Patient Details Name: Cassandra Mcconnell MRN: [id number] DOB: 10-10-1965 Today's Date: 09/17/2021  History of Present Illness  Patient is a 56 y/o female with h/o GERD, HTN, arthritis, bell's palsy, diverticulitis, hyperlipidemia and previous L4-5 fusion presents s/p Posterior Lumbar Interbody Fusion  - Lumbar three-Lumbar four extension removal of hardware - Posterior Lateral and Interbody fusion.  Clinical Impression  Patient presents with decreased mobility due to pain, decreased knowledge of precautions, decreased balance and generalized weakness.  She will benefit from skilled PT in the acute setting to progress mobility, safety with precautions and allow d/c home with spouse assist.  Likely to progress and not need follow up PT.        Recommendations for follow up therapy are one component of a multi-disciplinary discharge planning process, led by the attending physician.  Recommendations may be updated based on patient status, additional functional criteria and insurance authorization.  Follow Up Recommendations No PT follow up    Assistance Recommended at Discharge Frequent or constant Supervision/Assistance  Patient can return home with the following  Assist for transportation;Help with stairs or ramp for entrance;Assistance with cooking/housework;A little help with walking and/or transfers;A little help with bathing/dressing/bathroom    Equipment Recommendations None recommended by PT  Recommendations for Other Services       Functional Status Assessment Patient has had a recent decline in their functional status and demonstrates the ability to make significant improvements in function in a reasonable and predictable amount of time.     Precautions / Restrictions Precautions Precautions: Back Precaution Booklet Issued: Yes (comment) Precaution Comments: order says no brace needed, but pt has a brace      Mobility  Bed Mobility Overal bed  mobility: Needs Assistance Bed Mobility: Sidelying to Sit, Sit to Sidelying   Sidelying to sit: Min guard     Sit to sidelying: Mod assist General bed mobility comments: cues for technique, hand placement on bed/rail and assist for balance coming up to sit, to side assist for legs    Transfers Overall transfer level: Needs assistance Equipment used: Rolling walker (2 wheels) Transfers: Sit to/from Stand Sit to Stand: Min assist           General transfer comment: pulling up on walker, to sit no device, but used bed rail; assist for balance    Ambulation/Gait Ambulation/Gait assistance: Min guard, Min assist Gait Distance (Feet): 150 Feet Assistive device: Rolling walker (2 wheels), 1 person hand held assist Gait Pattern/deviations: Step-through pattern, Step-to pattern, Decreased stride length, Shuffle       General Gait Details: initially with RW and minguard, but pt lifting back legs so continued without RW and and with using rail on one side then needing HHA on the other with one stop due to pain and LOB  Stairs            Wheelchair Mobility    Modified Rankin (Stroke Patients Only)       Balance Overall balance assessment: Needs assistance   Sitting balance-Leahy Scale: Good     Standing balance support: Single extremity supported, Bilateral upper extremity supported Standing balance-Leahy Scale: Poor Standing balance comment: needing assist or UE support                             Pertinent Vitals/Pain Pain Assessment Pain Assessment: 0-10 Pain Score: 5  Pain Location: back Pain Descriptors / Indicators: Operative site guarding, Guarding Pain Intervention(s):  Monitored during session, Repositioned, Patient requesting pain meds-RN notified    Home Living Family/patient expects to be discharged to:: Private residence Living Arrangements: Spouse/significant other Available Help at Discharge: Family;Available 24 hours/day Type of  Home: House Home Access: Stairs to enter Entrance Stairs-Rails: Right Entrance Stairs-Number of Steps: 3   Home Layout: One level Home Equipment: Rollator (4 wheels);Cane - single point Additional Comments: upright walker    Prior Function Prior Level of Function : Independent/Modified Independent             Mobility Comments: using cane       Hand Dominance        Extremity/Trunk Assessment        Lower Extremity Assessment Lower Extremity Assessment: Generalized weakness    Cervical / Trunk Assessment Cervical / Trunk Assessment: Back Surgery  Communication   Communication: No difficulties  Cognition Arousal/Alertness: Awake/alert Behavior During Therapy: WFL for tasks assessed/performed Overall Cognitive Status: Within Functional Limits for tasks assessed                                          General Comments General comments (skin integrity, edema, etc.): issued handout for back precautions and reviewed with pt; spouse in the room, but sleeping on and off    Exercises     Assessment/Plan    PT Assessment Patient needs continued PT services  PT Problem List Decreased activity tolerance;Pain;Decreased knowledge of use of DME;Decreased mobility;Decreased safety awareness;Decreased knowledge of precautions;Decreased balance       PT Treatment Interventions DME instruction;Therapeutic activities;Gait training;Therapeutic exercise;Patient/family education;Balance training;Stair training;Functional mobility training    PT Goals (Current goals can be found in the Care Plan section)  Acute Rehab PT Goals Patient Stated Goal: to return to independent PT Goal Formulation: With patient Time For Goal Achievement: 10/01/21 Potential to Achieve Goals: Good    Frequency Min 5X/week     Co-evaluation               AM-PAC PT "6 Clicks" Mobility  Outcome Measure Help needed turning from your back to your side while in a flat bed  without using bedrails?: A Little Help needed moving from lying on your back to sitting on the side of a flat bed without using bedrails?: A Little Help needed moving to and from a bed to a chair (including a wheelchair)?: A Little Help needed standing up from a chair using your arms (e.g., wheelchair or bedside chair)?: A Little Help needed to walk in hospital room?: A Little Help needed climbing 3-5 steps with a railing? : Total 6 Click Score: 16    End of Session   Activity Tolerance: Patient tolerated treatment well Patient left: in bed;with call bell/phone within reach;with family/visitor present   PT Visit Diagnosis: Other abnormalities of gait and mobility (R26.89);Difficulty in walking, not elsewhere classified (R26.2);Pain Pain - part of body:  (back)    Time: 1535-1600 PT Time Calculation (min) (ACUTE ONLY): 25 min   Charges:   PT Evaluation $PT Eval Low Complexity: 1 Low PT Treatments $Gait Training: 8-22 mins        Magda Kiel, PT Acute Rehabilitation Services [facility]:[phone] [phone] 09/17/2021   Reginia Naas 09/17/2021, 4:49 PM

## 2021-09-18 MED ORDER — HYDROMORPHONE HCL 2 MG PO TABS: 2.0000 mg | ORAL_TABLET | ORAL | 0 refills | Status: DC | PRN

## 2021-09-18 MED ORDER — DESVENLAFAXINE SUCCINATE ER 50 MG PO TB24
50.0000 mg | ORAL_TABLET | Freq: Every day | ORAL | Status: DC
  Filled 2021-09-18: qty 1

## 2021-09-18 MED ORDER — PANTOPRAZOLE SODIUM 40 MG PO TBEC: 40.0000 mg | DELAYED_RELEASE_TABLET | Freq: Every day | ORAL | Status: DC

## 2021-09-18 NOTE — Progress Notes (Signed)
Neurosurgery Service Progress Note  Subjective: No acute events overnight, preop back pain is improved, no new numbness / weakness   Objective: Vitals:   09/18/21 0030 09/18/21 0342 09/18/21 0751 09/18/21 1133  BP: 119/83 126/65 128/71 124/63  Pulse: 67 77 77 72  Resp:   18 18  Temp: (!) 97.5 F (36.4 C) 97.7 F (36.5 C) 98 F (36.7 C) (!) 97.5 F (36.4 C)  TempSrc:   Oral Oral  SpO2: 96% 98% 97% 100%  Weight:      Height:        Physical Exam: +baseline L LMN pattern FD (prior bell's palsy), strength 5/5 x4 and SILTx4  Drain d/c'd, incision c/d/i  Assessment & Plan: 56 y.o. woman s/p extension of fusion up to L3-4, now L3-S1, recovering well.  -discharge home today  Judith Part  09/18/21 3:28 PM

## 2021-09-18 NOTE — Progress Notes (Signed)
  Transition of Care [facility]) Screening Note   Patient Details  Name: Cassandra Mcconnell Date of Birth: Apr 04, 1966   Transition of Care [facility]) CM/SW Contact:    Bartholomew Crews, RN Phone Number: [phone] 09/18/2021, 9:30 AM    Transition of Care Department [facility]) has reviewed patient and no TOC needs have been identified at this time. We will continue to monitor patient advancement through interdisciplinary progression rounds. If new patient transition needs arise, please place a TOC consult.   Acknowledging TOC consult for DME/HH needs. PT advises no needs. Ambulating 150'.

## 2021-09-18 NOTE — Discharge Instructions (Signed)
Back surgery

## 2021-09-18 NOTE — Discharge Summary (Signed)
Discharge Summary  Date of Admission: 09/17/2021  Date of Discharge: 09/18/21  Attending Physician: Kary Kos, MD  Hospital Course: Patient was admitted following an uncomplicated extension of her prior lumbar fusion up to L3-4. They were recovered in PACU and transferred to 4NP. Their drain was removed on POD1, preop symptoms were significantly improved, their hospital course was uncomplicated and the patient was discharged home on 09/18/21. They will follow up in clinic with me in clinic in 2 weeks.  Neurologic exam at discharge:  Strength 5/5 x4 and SILTx4 with baseline L LMN pattern FD  Discharge diagnosis: Lumbar radiculopathy  Judith Part, MD 09/18/21 3:31 PM

## 2021-09-18 NOTE — Evaluation (Signed)
Occupational Therapy Evaluation and Discharge Patient Details Name: Cassandra Mcconnell MRN: [id number] DOB: 1965-07-08 Today's Date: 09/18/2021   History of Present Illness Patient is a 56 y/o female with h/o GERD, HTN, arthritis, bell's palsy, diverticulitis, hyperlipidemia and previous L4-5 fusion presents s/p Posterior Lumbar Interbody Fusion  - Lumbar three-Lumbar four extension removal of hardware - Posterior Lateral and Interbody fusion.   Clinical Impression   This 56 yo female and underwent above presents to acute OT with all education completed, we will D/C from acute OT.      Recommendations for follow up therapy are one component of a multi-disciplinary discharge planning process, led by the attending physician.  Recommendations may be updated based on patient status, additional functional criteria and insurance authorization.   Follow Up Recommendations  No OT follow up    Assistance Recommended at Discharge PRN  Patient can return home with the following A lot of help with bathing/dressing/bathroom;Assistance with cooking/housework;Assist for transportation    Functional Status Assessment  Patient has had a recent decline in their functional status and demonstrates the ability to make significant improvements in function in a reasonable and predictable amount of time. (but no further skilled OT needs, all education completed)  Equipment Recommendations  BSC/3in1 (in order to adhere to back precautions)       Precautions / Restrictions Precautions Precautions: Back Precaution Booklet Issued: Yes (comment) (already had precaution sheet in room and reports she has read through it) Precaution Comments: order says no brace needed, but pt has a brace (told pt to ask MD if she needs to wear it) Restrictions Weight Bearing Restrictions: No      Mobility Bed Mobility Overal bed mobility: Modified Independent Bed Mobility: Rolling, Sidelying to Sit Rolling: Modified independent  (Device/Increase time) (HOB up) Sidelying to sit: HOB elevated            Transfers Overall transfer level: Modified independent Equipment used: None Transfers: Sit to/from Stand             General transfer comment: increased time      Balance Overall balance assessment: Mild deficits observed, not formally tested (when up on feet, walks tentatively)                                         ADL either performed or assessed with clinical judgement   ADL Overall ADL's : Needs assistance/impaired Eating/Feeding: Independent;Sitting   Grooming: Wash/dry hands;Oral care;Modified independent;Standing Grooming Details (indicate cue type and reason): pt used 2 cups to brush teeth Upper Body Bathing: Set up;Standing   Lower Body Bathing: Moderate assistance Lower Body Bathing Details (indicate cue type and reason): independent with standing, reports husband will A prn Upper Body Dressing : Independent;Standing   Lower Body Dressing: Moderate assistance Lower Body Dressing Details (indicate cue type and reason): independent with standing, reports husband will A prn Toilet Transfer: Modified Independent;Comfort height toilet;Grab bars   Toileting- Clothing Manipulation and Hygiene: Modified independent;Sit to/from stand               Vision Patient Visual Report: No change from baseline              Pertinent Vitals/Pain Pain Assessment Pain Assessment: Faces Faces Pain Scale: Hurts little more Pain Location: back Pain Descriptors / Indicators: Operative site guarding, Guarding Pain Intervention(s): Limited activity within patient's tolerance, Monitored during session  Hand Dominance Right   Extremity/Trunk Assessment Upper Extremity Assessment Upper Extremity Assessment: Overall WFL for tasks assessed           Communication Communication Communication: No difficulties   Cognition Arousal/Alertness: Awake/alert Behavior During  Therapy: WFL for tasks assessed/performed Overall Cognitive Status: Within Functional Limits for tasks assessed                                                  Home Living Family/patient expects to be discharged to:: Private residence Living Arrangements: Spouse/significant other Available Help at Discharge: Family;Available 24 hours/day Type of Home: House Home Access: Stairs to enter [employer] of Steps: 3 Entrance Stairs-Rails: Right Home Layout: One level     Bathroom Shower/Tub: Occupational psychologist: Standard     Home Equipment: Rollator (4 wheels);Cane - single point   Additional Comments: upright walker      Prior Functioning/Environment Prior Level of Function : Independent/Modified Independent             Mobility Comments: using cane          OT Problem List: Decreased range of motion;Impaired balance (sitting and/or standing);Pain         OT Goals(Current goals can be found in the care plan section) Acute Rehab OT Goals Patient Stated Goal: to go home today, get a shower         AM-PAC OT "6 Clicks" Daily Activity     Outcome Measure Help from another person eating meals?: None Help from another person taking care of personal grooming?: None Help from another person toileting, which includes using toliet, bedpan, or urinal?: None Help from another person bathing (including washing, rinsing, drying)?: A Little Help from another person to put on and taking off regular upper body clothing?: None Help from another person to put on and taking off regular lower body clothing?: A Little 6 Click Score: 22   End of Session Nurse Communication:  (chat text to RN and CM that pt needs a 3n1 prior to DC home)  Activity Tolerance: Patient tolerated treatment well Patient left:  (walking with PT)  OT Visit Diagnosis: Other abnormalities of gait and mobility (R26.89);Pain Pain - part of body:  (incisional)                 Time: 3748-2707 OT Time Calculation (min): 22 min Charges:  OT General Charges $OT Visit: 1 Visit OT Evaluation $OT Eval Moderate Complexity: [place], OTR/L Acute [employer] Pager [phone] Office [phone]    Almon Register 09/18/2021, 1:27 PM

## 2021-09-18 NOTE — Progress Notes (Signed)
Physical Therapy Treatment Patient Details Name: Cassandra Mcconnell MRN: [id number] DOB: 1965/07/03 Today's Date: 09/18/2021   History of Present Illness Patient is a 56 y/o female with h/o GERD, HTN, arthritis, bell's palsy, diverticulitis, hyperlipidemia and previous L4-5 fusion presents s/p Posterior Lumbar Interbody Fusion  - Lumbar three-Lumbar four extension removal of hardware - Posterior Lateral and Interbody fusion.    PT Comments    Pt was able to progress to ambulating without UE support at a min guard assist level today, but she continues to display some mild balance deficits and decreased gait speed primarily due to back pain. She was also able to negotiate x2 stairs with bil UE support and minA. I suspect as her pain improves she will progress quickly, thus current recommendations remain appropriate. Will continue to follow acutely.    Recommendations for follow up therapy are one component of a multi-disciplinary discharge planning process, led by the attending physician.  Recommendations may be updated based on patient status, additional functional criteria and insurance authorization.  Follow Up Recommendations  No PT follow up     Assistance Recommended at Discharge Frequent or constant Supervision/Assistance  Patient can return home with the following Assist for transportation;Help with stairs or ramp for entrance;Assistance with cooking/housework;A little help with bathing/dressing/bathroom   Equipment Recommendations  None recommended by PT    Recommendations for Other Services       Precautions / Restrictions Precautions Precautions: Back Precaution Booklet Issued: Yes (comment) Precaution Comments: order says no brace needed, but pt has a brace; hemovac Restrictions Weight Bearing Restrictions: No     Mobility  Bed Mobility Overal bed mobility: Modified Independent Bed Mobility: Sit to Supine       Sit to supine: Modified independent (Device/Increase  time), HOB elevated   General bed mobility comments: HOB elevated and increased time to return to supine from sit while maintaining spinal precautions    Transfers                   General transfer comment: Pt standing in room with OT upon arrival.    Ambulation/Gait Ambulation/Gait assistance: Min guard Gait Distance (Feet): 450 Feet Assistive device: None Gait Pattern/deviations: Step-through pattern, Decreased stride length, Shuffle Gait velocity: reduced Gait velocity interpretation: <1.8 ft/sec, indicate of risk for recurrent falls   General Gait Details: Pt with slow, but fairly steady gait without AD, intermittently holding onto wall rail, no LOB. Pt able to ambulate while changing head positions without LOB. Pt increased speed gradually as distance progressed. Min guard for safety   Stairs Stairs: Yes Stairs assistance: Min assist Stair Management: One rail Left, Step to pattern, Forwards, One rail Right Number of Stairs: 2 General stair comments: Ascends with L rail and descends with R rail and HHA throughout with other UE, needing minA to boost up each step and steady.   Wheelchair Mobility    Modified Rankin (Stroke Patients Only)       Balance Overall balance assessment: Mild deficits observed, not formally tested                                          Cognition Arousal/Alertness: Awake/alert Behavior During Therapy: WFL for tasks assessed/performed Overall Cognitive Status: Within Functional Limits for tasks assessed  General Comments: Needs occasional reminders to maintain spinal precautions        Exercises      General Comments        Pertinent Vitals/Pain Pain Assessment Pain Assessment: Faces Faces Pain Scale: Hurts little more Pain Location: back at location of hemovac Pain Descriptors / Indicators: Operative site guarding, Guarding Pain Intervention(s): Limited  activity within patient's tolerance, Monitored during session, Repositioned    Home Living Family/patient expects to be discharged to:: Private residence Living Arrangements: Spouse/significant other Available Help at Discharge: Family;Available 24 hours/day Type of Home: House Home Access: Stairs to enter Entrance Stairs-Rails: Right Entrance Stairs-Number of Steps: 3   Home Layout: One level Home Equipment: Rollator (4 wheels);Cane - single point Additional Comments: upright walker    Prior Function            PT Goals (current goals can now be found in the care plan section) Acute Rehab PT Goals Patient Stated Goal: to improve PT Goal Formulation: With patient/family Time For Goal Achievement: 10/01/21 Potential to Achieve Goals: Good Progress towards PT goals: Progressing toward goals    Frequency    Min 5X/week      PT Plan Current plan remains appropriate    Co-evaluation              AM-PAC PT "6 Clicks" Mobility   Outcome Measure  Help needed turning from your back to your side while in a flat bed without using bedrails?: None Help needed moving from lying on your back to sitting on the side of a flat bed without using bedrails?: None Help needed moving to and from a bed to a chair (including a wheelchair)?: A Little Help needed standing up from a chair using your arms (e.g., wheelchair or bedside chair)?: A Little Help needed to walk in hospital room?: A Little Help needed climbing 3-5 steps with a railing? : A Little 6 Click Score: 20    End of Session   Activity Tolerance: Patient tolerated treatment well Patient left: in bed;with call bell/phone within reach;with family/visitor present   PT Visit Diagnosis: Other abnormalities of gait and mobility (R26.89);Difficulty in walking, not elsewhere classified (R26.2);Pain;Unsteadiness on feet (R26.81) Pain - part of body:  (back)     Time: 0350-0938 PT Time Calculation (min) (ACUTE ONLY): 19  min  Charges:  $Gait Training: 8-22 mins                     Moishe Spice, PT, DPT Acute Rehabilitation Services  Pager: [phone] Office: [place] 09/18/2021, 3:37 PM

## 2021-09-24 DIAGNOSIS — M5440 Lumbago with sciatica, unspecified side: Secondary | ICD-10-CM | POA: Diagnosis not present

## 2021-10-05 DIAGNOSIS — F331 Major depressive disorder, recurrent, moderate: Secondary | ICD-10-CM | POA: Diagnosis not present

## 2021-10-05 DIAGNOSIS — F411 Generalized anxiety disorder: Secondary | ICD-10-CM | POA: Diagnosis not present

## 2021-10-05 DIAGNOSIS — F6381 Intermittent explosive disorder: Secondary | ICD-10-CM | POA: Diagnosis not present

## 2021-10-13 ENCOUNTER — Encounter (HOSPITAL_COMMUNITY): Payer: Self-pay | Admitting: *Deleted

## 2021-10-28 DIAGNOSIS — M5440 Lumbago with sciatica, unspecified side: Secondary | ICD-10-CM | POA: Diagnosis not present

## 2021-12-09 DIAGNOSIS — M5440 Lumbago with sciatica, unspecified side: Secondary | ICD-10-CM | POA: Diagnosis not present

## 2021-12-17 DIAGNOSIS — F411 Generalized anxiety disorder: Secondary | ICD-10-CM | POA: Diagnosis not present

## 2021-12-17 DIAGNOSIS — F6381 Intermittent explosive disorder: Secondary | ICD-10-CM | POA: Diagnosis not present

## 2021-12-17 DIAGNOSIS — F331 Major depressive disorder, recurrent, moderate: Secondary | ICD-10-CM | POA: Diagnosis not present

## 2022-01-14 DIAGNOSIS — F331 Major depressive disorder, recurrent, moderate: Secondary | ICD-10-CM | POA: Diagnosis not present

## 2022-01-14 DIAGNOSIS — F6381 Intermittent explosive disorder: Secondary | ICD-10-CM | POA: Diagnosis not present

## 2022-01-14 DIAGNOSIS — F411 Generalized anxiety disorder: Secondary | ICD-10-CM | POA: Diagnosis not present

## 2022-02-09 DIAGNOSIS — F331 Major depressive disorder, recurrent, moderate: Secondary | ICD-10-CM | POA: Diagnosis not present

## 2022-02-09 DIAGNOSIS — F6381 Intermittent explosive disorder: Secondary | ICD-10-CM | POA: Diagnosis not present

## 2022-02-09 DIAGNOSIS — F411 Generalized anxiety disorder: Secondary | ICD-10-CM | POA: Diagnosis not present

## 2022-02-10 DIAGNOSIS — M5440 Lumbago with sciatica, unspecified side: Secondary | ICD-10-CM | POA: Diagnosis not present

## 2022-02-10 DIAGNOSIS — Z6833 Body mass index (BMI) 33.0-33.9, adult: Secondary | ICD-10-CM | POA: Diagnosis not present

## 2022-02-15 ENCOUNTER — Encounter: Payer: Self-pay | Admitting: Family Medicine

## 2022-02-15 ENCOUNTER — Other Ambulatory Visit: Payer: Self-pay

## 2022-02-15 DIAGNOSIS — E7800 Pure hypercholesterolemia, unspecified: Secondary | ICD-10-CM

## 2022-02-15 MED ORDER — ATORVASTATIN CALCIUM 20 MG PO TABS: 20.0000 mg | ORAL_TABLET | Freq: Every day | ORAL | 0 refills | Status: DC

## 2022-02-15 NOTE — Telephone Encounter (Signed)
Cassandra Mcconnell,  Pt was last seen in Jan 2023. She was supposed to follow up in 41mand did not. Pt does not currently have a follow up scheduled. Please verify if refill is alright to send.

## 2022-02-23 ENCOUNTER — Ambulatory Visit (INDEPENDENT_AMBULATORY_CARE_PROVIDER_SITE_OTHER): Payer: Medicare HMO | Admitting: Family Medicine

## 2022-02-23 ENCOUNTER — Encounter: Payer: Self-pay | Admitting: Family Medicine

## 2022-02-23 VITALS — BP 136/76 | HR 76 | Temp 97.6°F | Ht 64.0 in | Wt 197.2 lb

## 2022-02-23 DIAGNOSIS — F339 Major depressive disorder, recurrent, unspecified: Secondary | ICD-10-CM | POA: Diagnosis not present

## 2022-02-23 DIAGNOSIS — F39 Unspecified mood [affective] disorder: Secondary | ICD-10-CM

## 2022-02-23 DIAGNOSIS — Z23 Encounter for immunization: Secondary | ICD-10-CM | POA: Diagnosis not present

## 2022-02-23 DIAGNOSIS — Z6833 Body mass index (BMI) 33.0-33.9, adult: Secondary | ICD-10-CM | POA: Diagnosis not present

## 2022-02-23 DIAGNOSIS — E559 Vitamin D deficiency, unspecified: Secondary | ICD-10-CM | POA: Diagnosis not present

## 2022-02-23 DIAGNOSIS — F411 Generalized anxiety disorder: Secondary | ICD-10-CM | POA: Diagnosis not present

## 2022-02-23 DIAGNOSIS — E7800 Pure hypercholesterolemia, unspecified: Secondary | ICD-10-CM | POA: Diagnosis not present

## 2022-02-23 MED ORDER — ATORVASTATIN CALCIUM 20 MG PO TABS: 20.0000 mg | ORAL_TABLET | Freq: Every day | ORAL | 2 refills | Status: DC

## 2022-02-23 NOTE — Progress Notes (Signed)
Subjective:  Patient ID: Cassandra Mcconnell, female    DOB: 1965/09/23, 56 y.o.   MRN: [id number]  Patient Care Team: Baruch Gouty, FNP as PCP - General (Family Medicine) Irene Limbo, MD as Consulting Physician (Plastic Surgery)   Chief Complaint:  Medical Management of Chronic Issues   HPI: Cassandra Mcconnell is a 56 y.o. female presenting on 02/23/2022 for Medical Management of Chronic Issues   1. HYPERCHOLESTEROLEMIA, PURE Compliant with medications - Yes Current medications - Lipitor Side effects from medications - No Diet - generally healthy Exercise - not regular  2. BMI 33.0-33.9,adult Does try to watch her diet. Does not exercise on a regular basis.   3. Mood disorder ([place]) 4. Depression, recurrent ([place]) 5. GAD (generalized anxiety disorder) Followed by psychiatry on a regular. Going thorough a separation right now which has worsened symptoms.     02/23/2022    2:31 PM 05/12/2021    1:25 PM 05/05/2021    2:39 PM 09/12/2016   11:53 AM 05/31/2016   10:29 AM  Depression screen PHQ 2/9  Decreased Interest 3 0 0 3 0  Down, Depressed, Hopeless 3 0 0 3 0  PHQ - 2 Score 6 0 0 6 0  Altered sleeping _0 Tired, decreased energy 3 0 0 1   Change in appetite 3 0 0 0   Feeling bad or failure about yourself  3 0 0 3   Trouble concentrating 3 0 0 3   Moving slowly or fidgety/restless 3 0 0 1   Suicidal thoughts 0 0 0 0   PHQ-9 Score _1 Difficult doing work/chores Very difficult Somewhat difficult Not difficult at all Somewhat difficult       02/23/2022    2:32 PM 05/05/2021    2:40 PM  GAD 7 : Generalized Anxiety Score  Nervous, Anxious, on Edge 3 0  Control/stop worrying 3 0  Worry too much - different things 0 0  Trouble relaxing 3 0  Restless 2 0  Easily annoyed or irritable 2 0  Afraid - awful might happen 0 0  Total GAD 7 Score 13 0  Anxiety Difficulty  Not difficult at all     6. Vitamin D deficiency Not on repletion therapy. Denies  worsening arthralgias or difficulty walking.     Relevant past medical, surgical, family, and social history reviewed and updated as indicated.  Allergies and medications reviewed and updated. Data reviewed: Chart in Epic.   Past Medical History:  Diagnosis Date   Ankle fracture    left   Anxiety    Arthritis    Back pain    Bell's palsy    Left side, 2009, 2017   Bell's palsy    Depression    Diverticulitis    Dyspnea    GERD (gastroesophageal reflux disease)    Headache    History of hiatal hernia    Hyperlipidemia    Hypertension    Postoperative nausea 04/21/2016   Toe fracture    left great toe   Wears glasses     Past Surgical History:  Procedure Laterality Date   BACK SURGERY  2004   l4/5 fusion   BREAST REDUCTION SURGERY Bilateral 08/08/2014   Procedure: BILATERAL BREAST REDUCTION  (BREAST);  Surgeon: Irene Limbo, MD;  Location: [facility];  Service: Plastics;  Laterality: Bilateral;   CATARACT EXTRACTION Bilateral    CERVICAL DISCECTOMY  12/12/2018   3 level    CHOLECYSTECTOMY     1998   INJECTION KNEE Left    KNEE ARTHROSCOPY     LAPAROSCOPIC BILATERAL SALPINGO OOPHERECTOMY Bilateral 03/11/2015   Procedure: LAPAROSCOPIC BILATERAL SALPINGO OOPHORECTOMY;  Surgeon: Florian Buff, MD;  Location: [facility];  Service: Gynecology;  Laterality: Bilateral;   LAPAROSCOPIC GASTRIC SLEEVE RESECTION WITH HIATAL HERNIA REPAIR N/A 03/22/2016   Procedure: LAPAROSCOPIC GASTRIC SLEEVE RESECTION WITH HIATAL HERNIA REPAIR WITHUPPER ENDOSCOPY;  Surgeon: Alphonsa Overall, MD;  Location: [facility];  Service: General;  Laterality: N/A;   REDUCTION MAMMAPLASTY Bilateral    SEPTOPLASTY  1988   tonsillectomy  1991   TONSILLECTOMY      Social History   Socioeconomic History   Marital status: Married    Spouse name: Not on file   Number of children: 1   Years of education: Not on file   Highest education level: Not on file  Occupational History   Occupation:  stay at home mom  Tobacco Use   Smoking status: Never   Smokeless tobacco: Never  Vaping Use   Vaping Use: Never used  Substance and Sexual Activity   Alcohol use: No   Drug use: No   Sexual activity: Not Currently    Partners: Male    Birth control/protection: Post-menopausal, Surgical    Comment: BSO 2016  Other Topics Concern   Not on file  Social History Narrative   Lives at home with her husband   Left handed   Caffeine: 2-4 cups of coffee daily   Social Determinants of Health   Financial Resource Strain: Low Risk  (05/12/2021)   Overall Financial Resource Strain (CARDIA)    Difficulty of Paying Living Expenses: Not hard at all  Food Insecurity: No Food Insecurity (05/12/2021)   Hunger Vital Sign    Worried About Running Out of Food in the Last Year: Never true    [place] in the Last Year: Never true  Transportation Needs: No Transportation Needs (05/12/2021)   PRAPARE - Hydrologist (Medical): No    Lack of Transportation (Non-Medical): No  Physical Activity: Inactive (05/12/2021)   Exercise Vital Sign    Days of Exercise per Week: 0 days    Minutes of Exercise per Session: 0 min  Stress: No Stress Concern Present (05/12/2021)   [place]    Feeling of Stress : Not at all  Social Connections: [place] (05/12/2021)   Social Connection and Isolation Panel [NHANES]    Frequency of Communication with Friends and Family: More than three times a week    Frequency of Social Gatherings with Friends and Family: More than three times a week    Attends Religious Services: More than 4 times per year    Active Member of [employer] or Organizations: Yes    Attends Music therapist: More than 4 times per year    Marital Status: Married  Human resources officer Violence: Not At Risk (05/12/2021)   Humiliation, Afraid, Rape, and Kick questionnaire    Fear of Current or  Ex-Partner: No    Emotionally Abused: No    Physically Abused: No    Sexually Abused: No    Outpatient Encounter Medications as of 02/23/2022  Medication Sig   acetaminophen (TYLENOL) 500 MG tablet Take 1,000 mg by mouth every 6 (six) hours as needed. For pain    citalopram (CELEXA) 20 MG tablet  Take 1 tablet (20 mg total) by mouth 2 (two) times a day. (Patient taking differently: Take 20 mg by mouth daily.)   desvenlafaxine (PRISTIQ) 50 MG 24 hr tablet Take 100 mg by mouth at bedtime.   ibuprofen (ADVIL) 200 MG tablet Take 800-1,200 mg by mouth every 8 (eight) hours as needed.   Olopatadine HCl 0.2 % SOLN Place 1 drop into both eyes daily as needed (allergies).   promethazine (PHENERGAN) 25 MG tablet Take 1 tablet (25 mg total) by mouth every 8 (eight) hours as needed for nausea or vomiting.   Propylene Glycol (SYSTANE BALANCE) 0.6 % SOLN Apply 1 drop to eye daily as needed (dry eyes).   tiZANidine (ZANAFLEX) 4 MG tablet Take 1 tablet (4 mg total) by mouth every 8 (eight) hours as needed for muscle spasms.   topiramate (TOPAMAX) 50 MG tablet Take 50 mg by mouth 2 (two) times daily.   [DISCONTINUED] atorvastatin (LIPITOR) 20 MG tablet Take 1 tablet (20 mg total) by mouth daily.   atorvastatin (LIPITOR) 20 MG tablet Take 1 tablet (20 mg total) by mouth daily.   [DISCONTINUED] HYDROmorphone (DILAUDID) 2 MG tablet Take 1 tablet (2 mg total) by mouth every 4 (four) hours as needed (pain).   No facility-administered encounter medications on file as of 02/23/2022.    Allergies  Allergen Reactions   Zofran [Ondansetron Hcl] Shortness Of Breath and Rash    Numbness   Azithromycin Swelling   Conjugated Estrogens Other (See Comments)    emotional   Cyclobenzaprine Hcl     REACTION: disorientation   Hydrocodone     Other reaction(s): Unknown   Misc. Sulfonamide Containing Compounds Other (See Comments)   Norco [Hydrocodone-Acetaminophen] Other (See Comments)    Felt like she was going through  withdrawal   Nsaids Other (See Comments)    Cannot have because of bariatric surgery   Oxycontin [Oxycodone]     Withdrawal feeling    Percocet [Oxycodone-Acetaminophen]     Hypotension    Robaxin [Methocarbamol]     Pt does not remember reaction    Rosuvastatin Dermatitis   Sulfa Antibiotics Hives and Itching   Wellbutrin [Bupropion Hcl] Other (See Comments)    Emotional, makes depression worse   Ciprofloxacin Itching and Rash   Dicyclomine Hcl Itching and Rash    Review of Systems  Constitutional:  Positive for activity change, appetite change and fatigue. Negative for chills, diaphoresis, fever and unexpected weight change.  HENT: Negative.    Eyes: Negative.  Negative for photophobia and visual disturbance.  Respiratory:  Negative for cough, chest tightness and shortness of breath.   Cardiovascular:  Negative for chest pain, palpitations and leg swelling.  Gastrointestinal:  Negative for abdominal pain, blood in stool, constipation, diarrhea, nausea and vomiting.  Endocrine: Negative.  Negative for polydipsia, polyphagia and polyuria.  Genitourinary:  Negative for decreased urine volume, difficulty urinating, dysuria, frequency and urgency.  Musculoskeletal:  Negative for arthralgias and myalgias.  Skin: Negative.   Allergic/Immunologic: Negative.   Neurological:  Negative for dizziness and headaches.  Hematological: Negative.   Psychiatric/Behavioral:  Positive for agitation, decreased concentration and sleep disturbance. Negative for behavioral problems, confusion, dysphoric mood, hallucinations, self-injury and suicidal ideas. The patient is nervous/anxious. The patient is not hyperactive.   All other systems reviewed and are negative.       Objective:  BP 136/76   Pulse 76   Temp 97.6 F (36.4 C) (Temporal)   Ht _0  (1.626 m)  Wt 197 lb 3.2 oz (89.4 kg)   LMP 02/10/2015 Comment: had BSO 2016  SpO2 98%   BMI 33.85 kg/m    Wt Readings from Last 3 Encounters:   02/23/22 197 lb 3.2 oz (89.4 kg)  09/17/21 196 lb (88.9 kg)  09/08/21 198 lb (89.8 kg)    Physical Exam Vitals and nursing note reviewed.  Constitutional:      General: She is not in acute distress.    Appearance: Normal appearance. She is well-developed and well-groomed. She is obese. She is not ill-appearing, toxic-appearing or diaphoretic.  HENT:     Head: Normocephalic and atraumatic.     Jaw: There is normal jaw occlusion.     Right Ear: Hearing normal.     Left Ear: Hearing normal.     Nose: Nose normal.     Mouth/Throat:     Lips: Pink.     Mouth: Mucous membranes are moist.     Pharynx: Oropharynx is clear. Uvula midline.  Eyes:     General: Lids are normal.     Extraocular Movements: Extraocular movements intact.     Conjunctiva/sclera: Conjunctivae normal.     Pupils: Pupils are equal, round, and reactive to light.  Neck:     Thyroid: No thyroid mass, thyromegaly or thyroid tenderness.     Vascular: No carotid bruit or JVD.     Trachea: Trachea and phonation normal.  Cardiovascular:     Rate and Rhythm: Normal rate and regular rhythm.     Chest Wall: PMI is not displaced.     Pulses: Normal pulses.     Heart sounds: Normal heart sounds. No murmur heard.    No friction rub. No gallop.  Pulmonary:     Effort: Pulmonary effort is normal. No respiratory distress.     Breath sounds: Normal breath sounds. No wheezing.  Abdominal:     General: Bowel sounds are normal. There is no distension or abdominal bruit.     Palpations: Abdomen is soft. There is no hepatomegaly or splenomegaly.     Tenderness: There is no abdominal tenderness. There is no right CVA tenderness or left CVA tenderness.     Hernia: No hernia is present.  Musculoskeletal:        General: Normal range of motion.     Cervical back: Normal range of motion and neck supple.     Right lower leg: No edema.     Left lower leg: No edema.  Lymphadenopathy:     Cervical: No cervical adenopathy.  Skin:     General: Skin is warm and dry.     Capillary Refill: Capillary refill takes less than 2 seconds.     Coloration: Skin is not cyanotic, jaundiced or pale.     Findings: No rash.  Neurological:     General: No focal deficit present.     Mental Status: She is alert and oriented to person, place, and time.     Sensory: Sensation is intact.     Motor: Motor function is intact.     Coordination: Coordination is intact.     Gait: Gait is intact. Gait normal.     Deep Tendon Reflexes: Reflexes are normal and symmetric.  Psychiatric:        Attention and Perception: Attention and perception normal.        Mood and Affect: Mood and affect normal.        Speech: Speech normal.  Behavior: Behavior normal. Behavior is cooperative.        Thought Content: Thought content normal.        Cognition and Memory: Cognition and memory normal.        Judgment: Judgment normal.     Results for orders placed or performed during the hospital encounter of 09/08/21  Surgical pcr screen   Specimen: Nasal Mucosa; Nasal Swab  Result Value Ref Range   MRSA, PCR NEGATIVE NEGATIVE   Staphylococcus aureus NEGATIVE NEGATIVE  Basic metabolic panel per protocol  Result Value Ref Range   Sodium 138 135 - 145 mmol/L   Potassium 3.7 3.5 - 5.1 mmol/L   Chloride 105 98 - 111 mmol/L   CO2 25 22 - 32 mmol/L   Glucose, Bld 81 70 - 99 mg/dL   BUN 11 6 - 20 mg/dL   Creatinine, Ser 0.84 0.44 - 1.00 mg/dL   Calcium 9.6 8.9 - 10.3 mg/dL   GFR, Estimated >60 >60 mL/min   Anion gap 8 5 - 15  CBC per protocol  Result Value Ref Range   WBC 8.4 4.0 - 10.5 K/uL   RBC 4.55 3.87 - 5.11 MIL/uL   Hemoglobin 13.8 12.0 - 15.0 g/dL   HCT 41.5 36.0 - 46.0 %   MCV 91.2 80.0 - 100.0 fL   MCH 30.3 26.0 - 34.0 pg   MCHC 33.3 30.0 - 36.0 g/dL   RDW 12.6 11.5 - 15.5 %   Platelets 254 150 - 400 K/uL   nRBC 0.0 0.0 - 0.2 %  Type and screen [place]  Result Value Ref Range   ABO/RH(D) A POS    Antibody  Screen NEG    Sample Expiration 09/22/2021,2359    Extend sample reason      NO TRANSFUSIONS OR PREGNANCY IN THE PAST 3 MONTHS Performed at [facility], [address] [place], [place] [address]        Pertinent labs & imaging results that were available during my care of the patient were reviewed by me and considered in my medical decision making.  Assessment & Plan:  Cassandra Mcconnell was seen today for medical management of chronic issues.  Diagnoses and all orders for this visit:  HYPERCHOLESTEROLEMIA, PURE Diet encouraged - increase intake of fresh fruits and vegetables, increase intake of lean proteins. Bake, broil, or grill foods. Avoid fried, greasy, and fatty foods. Avoid fast foods. Increase intake of fiber-rich whole grains. Exercise encouraged - at least 150 minutes per week and advance as tolerated. Goal BMI < 25. Continue medications as prescribed. Follow up in 3-6 months as discussed.  -     atorvastatin (LIPITOR) 20 MG tablet; Take 1 tablet (20 mg total) by mouth daily. -     CMP14+EGFR -     CBC with Differential/Platelet -     Lipid panel -     Thyroid Panel With TSH  BMI 33.0-33.9,adult Diet and exercise encouraged. Labs pending.  -     CMP14+EGFR -     CBC with Differential/Platelet -     Lipid panel -     Thyroid Panel With TSH -     VITAMIN D 25 Hydroxy (Vit-D Deficiency, Fractures)  Mood disorder (HCC) Depression, recurrent (HCC) GAD (generalized anxiety disorder) Followed by psychiatry on a regular basis.  -     Thyroid Panel With TSH  Vitamin D deficiency Labs pending, will restart repletion therapy if warranted.  -  VITAMIN D 25 Hydroxy (Vit-D Deficiency, Fractures)     Continue all other maintenance medications.  Follow up plan: Return in about 1 year (around 02/24/2023), or if symptoms worsen or fail to improve, for HLD.   Continue healthy lifestyle choices, including diet (rich in fruits, vegetables, and lean proteins, and low in salt and  simple carbohydrates) and exercise (at least 30 minutes of moderate physical activity daily).  Educational handout given for HLD  The above assessment and management plan was discussed with the patient. The patient verbalized understanding of and has agreed to the management plan. Patient is aware to call the clinic if they develop any new symptoms or if symptoms persist or worsen. Patient is aware when to return to the clinic for a follow-up visit. Patient educated on when it is appropriate to go to the emergency department.   Monia Pouch, FNP-C [facility] [phone]

## 2022-02-24 ENCOUNTER — Ambulatory Visit: Payer: Medicare HMO | Admitting: Clinical

## 2022-02-24 NOTE — Addendum Note (Signed)
Addended by: Baruch Gouty on: 02/24/2022 03:37 PM   Modules accepted: Orders

## 2022-02-27 LAB — LIPID PANEL
Chol/HDL Ratio: 4.4 ratio (ref 0.0–4.4)
Cholesterol, Total: 171 mg/dL (ref 100–199)
HDL: 39 mg/dL — ABNORMAL LOW (ref >39)
LDL Chol Calc (NIH): 88 mg/dL (ref 0–99)
Triglycerides: 262 mg/dL — ABNORMAL HIGH (ref 0–149)
VLDL Cholesterol Cal: 44 mg/dL — ABNORMAL HIGH (ref 5–40)

## 2022-02-27 LAB — THYROID PANEL WITH TSH
Free Thyroxine Index: 1.5 (ref 1.2–4.9)
T3 Uptake Ratio: 26 % (ref 24–39)
T4, Total: 5.7 ug/dL (ref 4.5–12.0)
TSH: 1.5 u[IU]/mL (ref 0.450–4.500)

## 2022-02-27 LAB — CBC WITH DIFFERENTIAL/PLATELET
Basophils Absolute: 0.1 10*3/uL (ref 0.0–0.2)
Basos: 1 %
EOS (ABSOLUTE): 0.1 10*3/uL (ref 0.0–0.4)
Eos: 1 %
Hematocrit: 42.3 % (ref 34.0–46.6)
Hemoglobin: 13.8 g/dL (ref 11.1–15.9)
Immature Grans (Abs): 0 10*3/uL (ref 0.0–0.1)
Immature Granulocytes: 0 %
Lymphocytes Absolute: 2.6 10*3/uL (ref 0.7–3.1)
Lymphs: 28 %
MCH: 28.5 pg (ref 26.6–33.0)
MCHC: 32.6 g/dL (ref 31.5–35.7)
MCV: 87 fL (ref 79–97)
Monocytes Absolute: 0.9 10*3/uL (ref 0.1–0.9)
Monocytes: 10 %
Neutrophils Absolute: 5.6 10*3/uL (ref 1.4–7.0)
Neutrophils: 60 %
Platelets: 249 10*3/uL (ref 150–450)
RBC: 4.84 x10E6/uL (ref 3.77–5.28)
RDW: 13.9 % (ref 11.7–15.4)
WBC: 9.3 10*3/uL (ref 3.4–10.8)

## 2022-02-27 LAB — CMP14+EGFR
ALT: 19 IU/L (ref 0–32)
AST: 25 IU/L (ref 0–40)
Albumin/Globulin Ratio: 1.7 (ref 1.2–2.2)
Albumin: 4.3 g/dL (ref 3.8–4.9)
Alkaline Phosphatase: 94 IU/L (ref 44–121)
BUN/Creatinine Ratio: 16 (ref 9–23)
BUN: 14 mg/dL (ref 6–24)
Bilirubin Total: 0.4 mg/dL (ref 0.0–1.2)
CO2: 18 mmol/L — ABNORMAL LOW (ref 20–29)
Calcium: 9.9 mg/dL (ref 8.7–10.2)
Chloride: 106 mmol/L (ref 96–106)
Creatinine, Ser: 0.85 mg/dL (ref 0.57–1.00)
Globulin, Total: 2.6 g/dL (ref 1.5–4.5)
Glucose: 71 mg/dL (ref 70–99)
Potassium: 4.2 mmol/L (ref 3.5–5.2)
Sodium: 140 mmol/L (ref 134–144)
Total Protein: 6.9 g/dL (ref 6.0–8.5)
eGFR: 80 mL/min/{1.73_m2} (ref >59)

## 2022-02-27 LAB — VITAMIN D 25 HYDROXY (VIT D DEFICIENCY, FRACTURES): Vit D, 25-Hydroxy: 46.6 ng/mL (ref 30.0–100.0)

## 2022-03-09 ENCOUNTER — Other Ambulatory Visit: Payer: Medicare HMO

## 2022-03-09 DIAGNOSIS — E7800 Pure hypercholesterolemia, unspecified: Secondary | ICD-10-CM | POA: Diagnosis not present

## 2022-03-10 ENCOUNTER — Encounter: Payer: Self-pay | Admitting: Family Medicine

## 2022-03-10 LAB — LIPID PANEL
Chol/HDL Ratio: 4.3 ratio (ref 0.0–4.4)
Cholesterol, Total: 155 mg/dL (ref 100–199)
HDL: 36 mg/dL — ABNORMAL LOW (ref >39)
LDL Chol Calc (NIH): 86 mg/dL (ref 0–99)
Triglycerides: 193 mg/dL — ABNORMAL HIGH (ref 0–149)
VLDL Cholesterol Cal: 33 mg/dL (ref 5–40)

## 2022-03-29 ENCOUNTER — Ambulatory Visit: Payer: Medicare HMO | Admitting: Clinical

## 2022-05-02 DIAGNOSIS — F411 Generalized anxiety disorder: Secondary | ICD-10-CM | POA: Diagnosis not present

## 2022-05-02 DIAGNOSIS — F6381 Intermittent explosive disorder: Secondary | ICD-10-CM | POA: Diagnosis not present

## 2022-05-02 DIAGNOSIS — F331 Major depressive disorder, recurrent, moderate: Secondary | ICD-10-CM | POA: Diagnosis not present

## 2022-05-12 ENCOUNTER — Ambulatory Visit: Payer: Medicare HMO | Admitting: Family Medicine

## 2022-05-13 ENCOUNTER — Ambulatory Visit (INDEPENDENT_AMBULATORY_CARE_PROVIDER_SITE_OTHER): Payer: Medicare HMO

## 2022-05-13 VITALS — Ht 64.0 in | Wt 197.0 lb

## 2022-05-13 DIAGNOSIS — Z0000 Encounter for general adult medical examination without abnormal findings: Secondary | ICD-10-CM

## 2022-05-13 NOTE — Patient Instructions (Signed)
[name] , Thank you for taking time to come for your Medicare Wellness Visit. I appreciate your ongoing commitment to your health goals. Please review the following plan we discussed and let me know if I can assist you in the future.   These are the goals we discussed:  Goals      Exercise 3x per week (30 min per time)        This is a list of the screening recommended for you and due dates:  Health Maintenance  Topic Date Due   Hepatitis C Screening: USPSTF Recommendation to screen - Ages 2-79 yo.  Never done   Pap Smear  12/25/2002   COVID-19 Vaccine (3 - 2023-24 season) 12/24/2021   Zoster (Shingles) Vaccine (1 of 2) 05/26/2022*   Colon Cancer Screening  08/07/2024*   HIV Screening  01/11/2049*   Mammogram  05/31/2022   Medicare Annual Wellness Visit  05/14/2023   DTaP/Tdap/Td vaccine (3 - Td or Tdap) 02/24/2032   Flu Shot  Completed   HPV Vaccine  Aged Out  *Topic was postponed. The date shown is not the original due date.    Advanced directives: Please bring a copy of your health care power of attorney and living will to the office to be added to your chart at your convenience.   Conditions/risks identified: Aim for 30 minutes of exercise or brisk walking, 6-8 glasses of water, and 5 servings of fruits and vegetables each day.   Next appointment: Follow up in one year for your annual wellness visit    Preventive Care 65 Years and Older, Female Preventive care refers to lifestyle choices and visits with your health care provider that can promote health and wellness. What does preventive care include? A yearly physical exam. This is also called an annual well check. Dental exams once or twice a year. Routine eye exams. Ask your health care provider how often you should have your eyes checked. Personal lifestyle choices, including: Daily care of your teeth and gums. Regular physical activity. Eating a healthy diet. Avoiding tobacco and drug use. Limiting alcohol  use. Practicing safe sex. Taking low-dose aspirin every day. Taking vitamin and mineral supplements as recommended by your health care provider. What happens during an annual well check? The services and screenings done by your health care provider during your annual well check will depend on your age, overall health, lifestyle risk factors, and family history of disease. Counseling  Your health care provider may ask you questions about your: Alcohol use. Tobacco use. Drug use. Emotional well-being. Home and relationship well-being. Sexual activity. Eating habits. History of falls. Memory and ability to understand (cognition). Work and work Statistician. Reproductive health. Screening  You may have the following tests or measurements: Height, weight, and BMI. Blood pressure. Lipid and cholesterol levels. These may be checked every 5 years, or more frequently if you are over 70 years old. Skin check. Lung cancer screening. You may have this screening every year starting at age 71 if you have a 30-pack-year history of smoking and currently smoke or have quit within the past 15 years. Fecal occult blood test (FOBT) of the stool. You may have this test every year starting at age 79. Flexible sigmoidoscopy or colonoscopy. You may have a sigmoidoscopy every 5 years or a colonoscopy every 10 years starting at age 6. Hepatitis C blood test. Hepatitis B blood test. Sexually transmitted disease (STD) testing. Diabetes screening. This is done by checking your blood sugar (glucose) after  you have not eaten for a while (fasting). You may have this done every 1-3 years. Bone density scan. This is done to screen for osteoporosis. You may have this done starting at age 47. Mammogram. This may be done every 1-2 years. Talk to your health care provider about how often you should have regular mammograms. Talk with your health care provider about your test results, treatment options, and if necessary,  the need for more tests. Vaccines  Your health care provider may recommend certain vaccines, such as: Influenza vaccine. This is recommended every year. Tetanus, diphtheria, and acellular pertussis (Tdap, Td) vaccine. You may need a Td booster every 10 years. Zoster vaccine. You may need this after age 40. Pneumococcal 13-valent conjugate (PCV13) vaccine. One dose is recommended after age 45. Pneumococcal polysaccharide (PPSV23) vaccine. One dose is recommended after age 42. Talk to your health care provider about which screenings and vaccines you need and how often you need them. This information is not intended to replace advice given to you by your health care provider. Make sure you discuss any questions you have with your health care provider. Document Released: 05/08/2015 Document Revised: 12/30/2015 Document Reviewed: 02/10/2015 Elsevier Interactive Patient Education  2017 [place] Prevention in the Home Falls can cause injuries. They can happen to people of all ages. There are many things you can do to make your home safe and to help prevent falls. What can I do on the outside of my home? Regularly fix the edges of walkways and driveways and fix any cracks. Remove anything that might make you trip as you walk through a door, such as a raised step or threshold. Trim any bushes or trees on the path to your home. Use bright outdoor lighting. Clear any walking paths of anything that might make someone trip, such as rocks or tools. Regularly check to see if handrails are loose or broken. Make sure that both sides of any steps have handrails. Any raised decks and porches should have guardrails on the edges. Have any leaves, snow, or ice cleared regularly. Use sand or salt on walking paths during winter. Clean up any spills in your garage right away. This includes oil or grease spills. What can I do in the bathroom? Use night lights. Install grab bars by the toilet and in the  tub and shower. Do not use towel bars as grab bars. Use non-skid mats or decals in the tub or shower. If you need to sit down in the shower, use a plastic, non-slip stool. Keep the floor dry. Clean up any water that spills on the floor as soon as it happens. Remove soap buildup in the tub or shower regularly. Attach bath mats securely with double-sided non-slip rug tape. Do not have throw rugs and other things on the floor that can make you trip. What can I do in the bedroom? Use night lights. Make sure that you have a light by your bed that is easy to reach. Do not use any sheets or blankets that are too big for your bed. They should not hang down onto the floor. Have a firm chair that has side arms. You can use this for support while you get dressed. Do not have throw rugs and other things on the floor that can make you trip. What can I do in the kitchen? Clean up any spills right away. Avoid walking on wet floors. Keep items that you use a lot in easy-to-reach places. If you  need to reach something above you, use a strong step stool that has a grab bar. Keep electrical cords out of the way. Do not use floor polish or wax that makes floors slippery. If you must use wax, use non-skid floor wax. Do not have throw rugs and other things on the floor that can make you trip. What can I do with my stairs? Do not leave any items on the stairs. Make sure that there are handrails on both sides of the stairs and use them. Fix handrails that are broken or loose. Make sure that handrails are as long as the stairways. Check any carpeting to make sure that it is firmly attached to the stairs. Fix any carpet that is loose or worn. Avoid having throw rugs at the top or bottom of the stairs. If you do have throw rugs, attach them to the floor with carpet tape. Make sure that you have a light switch at the top of the stairs and the bottom of the stairs. If you do not have them, ask someone to add them for  you. What else can I do to help prevent falls? Wear shoes that: Do not have high heels. Have rubber bottoms. Are comfortable and fit you well. Are closed at the toe. Do not wear sandals. If you use a stepladder: Make sure that it is fully opened. Do not climb a closed stepladder. Make sure that both sides of the stepladder are locked into place. Ask someone to hold it for you, if possible. Clearly mark and make sure that you can see: Any grab bars or handrails. First and last steps. Where the edge of each step is. Use tools that help you move around (mobility aids) if they are needed. These include: Canes. Walkers. Scooters. Crutches. Turn on the lights when you go into a dark area. Replace any light bulbs as soon as they burn out. Set up your furniture so you have a clear path. Avoid moving your furniture around. If any of your floors are uneven, fix them. If there are any pets around you, be aware of where they are. Review your medicines with your doctor. Some medicines can make you feel dizzy. This can increase your chance of falling. Ask your doctor what other things that you can do to help prevent falls. This information is not intended to replace advice given to you by your health care provider. Make sure you discuss any questions you have with your health care provider. Document Released: 02/05/2009 Document Revised: 09/17/2015 Document Reviewed: 05/16/2014 Elsevier Interactive Patient Education  2017 [employer].

## 2022-05-13 NOTE — Progress Notes (Signed)
Subjective:   Cassandra Mcconnell is a 57 y.o. female who presents for Medicare Annual (Subsequent) preventive examination. I connected with  [name] on 05/13/22 by a audio enabled telemedicine application and verified that I am speaking with the correct person using two identifiers.  Patient Location: Home  Provider Location: Home Office  I discussed the limitations of evaluation and management by telemedicine. The patient expressed understanding and agreed to proceed.  Review of Systems     Cardiac Risk Factors include: advanced age (>52mn, >>52women);dyslipidemia     Objective:    Today's Vitals   05/13/22 1409  Weight: 197 lb (89.4 kg)  Height: '5\' 4"'$  (1.626 m)   Body mass index is 33.81 kg/m.     05/13/2022    2:13 PM 09/08/2021   12:58 PM 05/12/2021    1:27 PM 02/01/2019    9:11 PM 04/30/2018    9:48 AM 04/27/2018   11:19 AM 07/30/2017   10:14 AM  Advanced Directives  Does Patient Have a Medical Advance Directive? Yes Yes Yes No Yes Yes Yes  Type of AParamed[place]of [place] will [place]Living will [place]Strathmoor VillageLiving will  [place]Living will [place]astonLiving will HGardnerville RanchosLiving will  Does patient want to make changes to medical advance directive?  No - Patient declined       Copy of [place] Chart? No - copy requested No - copy requested No - copy requested      Would patient like information on creating a medical advance directive?    No - Patient declined       Current Medications (verified) Outpatient Encounter Medications as of 05/13/2022  Medication Sig   acetaminophen (TYLENOL) 500 MG tablet Take 1,000 mg by mouth every 6 (six) hours as needed. For pain    atorvastatin (LIPITOR) 20 MG tablet Take 1 tablet (20 mg total) by mouth daily.   citalopram (CELEXA) 20 MG tablet Take 1 tablet (20 mg total) by mouth 2 (two)  times a day. (Patient taking differently: Take 20 mg by mouth daily.)   desvenlafaxine (PRISTIQ) 50 MG 24 hr tablet Take 100 mg by mouth at bedtime.   ibuprofen (ADVIL) 200 MG tablet Take 800-1,200 mg by mouth every 8 (eight) hours as needed.   Olopatadine HCl 0.2 % SOLN Place 1 drop into both eyes daily as needed (allergies).   promethazine (PHENERGAN) 25 MG tablet Take 1 tablet (25 mg total) by mouth every 8 (eight) hours as needed for nausea or vomiting.   Propylene Glycol (SYSTANE BALANCE) 0.6 % SOLN Apply 1 drop to eye daily as needed (dry eyes).   tiZANidine (ZANAFLEX) 4 MG tablet Take 1 tablet (4 mg total) by mouth every 8 (eight) hours as needed for muscle spasms.   topiramate (TOPAMAX) 50 MG tablet Take 50 mg by mouth 2 (two) times daily.   No facility-administered encounter medications on file as of 05/13/2022.    Allergies (verified) Zofran [ondansetron hcl], Azithromycin, Conjugated estrogens, Cyclobenzaprine hcl, Hydrocodone, Misc. sulfonamide containing compounds, Norco [hydrocodone-acetaminophen], Nsaids, Oxycontin [oxycodone], Percocet [oxycodone-acetaminophen], Robaxin [methocarbamol], Rosuvastatin, Sulfa antibiotics, Wellbutrin [bupropion hcl], Ciprofloxacin, and Dicyclomine hcl   History: Past Medical History:  Diagnosis Date   Ankle fracture    left   Anxiety    Arthritis    Back pain    Bell's palsy    Left side, 2009, 2017   Bell's  palsy    Depression    Diverticulitis    Dyspnea    GERD (gastroesophageal reflux disease)    Headache    History of hiatal hernia    Hyperlipidemia    Hypertension    Postoperative nausea 04/21/2016   Toe fracture    left great toe   Wears glasses    Past Surgical History:  Procedure Laterality Date   BACK SURGERY  2004   l4/5 fusion   BREAST REDUCTION SURGERY Bilateral 08/08/2014   Procedure: BILATERAL BREAST REDUCTION  (BREAST);  Surgeon: Irene Limbo, MD;  Location: [facility];  Service: Plastics;   Laterality: Bilateral;   CATARACT EXTRACTION Bilateral    CERVICAL DISCECTOMY  12/12/2018   3 level    CHOLECYSTECTOMY     1998   INJECTION KNEE Left    KNEE ARTHROSCOPY     LAPAROSCOPIC BILATERAL SALPINGO OOPHERECTOMY Bilateral 03/11/2015   Procedure: LAPAROSCOPIC BILATERAL SALPINGO OOPHORECTOMY;  Surgeon: Florian Buff, MD;  Location: [facility];  Service: Gynecology;  Laterality: Bilateral;   LAPAROSCOPIC GASTRIC SLEEVE RESECTION WITH HIATAL HERNIA REPAIR N/A 03/22/2016   Procedure: LAPAROSCOPIC GASTRIC SLEEVE RESECTION WITH HIATAL HERNIA REPAIR WITHUPPER ENDOSCOPY;  Surgeon: Alphonsa Overall, MD;  Location: [facility];  Service: General;  Laterality: N/A;   REDUCTION MAMMAPLASTY Bilateral    SEPTOPLASTY  1988   tonsillectomy  1991   TONSILLECTOMY     Family History  Problem Relation Age of Onset   COPD Mother    Hypertension Mother    GER disease Mother    Heart attack Father    Prostate cancer Father    Hyperlipidemia Brother    Hypertension Brother    Diabetes Brother    Neuropathy Brother    Hyperlipidemia Brother    Hypertension Brother    Heart attack Paternal Grandmother    Heart attack Paternal Grandfather    Stroke Paternal Grandfather    Cancer Paternal Uncle        prostate and bone   Cancer Paternal Uncle        prostate and bone   Colon cancer Neg Hx    Stomach cancer Neg Hx    Social History   Socioeconomic History   Marital status: Married    Spouse name: Not on file   Number of children: 1   Years of education: Not on file   Highest education level: Not on file  Occupational History   Occupation: stay at home mom  Tobacco Use   Smoking status: Never   Smokeless tobacco: Never  Vaping Use   Vaping Use: Never used  Substance and Sexual Activity   Alcohol use: No   Drug use: No   Sexual activity: Not Currently    Partners: Male    Birth control/protection: Post-menopausal, Surgical    Comment: BSO 2016  Other Topics Concern   Not on file  Social  History Narrative   Lives at home with her husband   Left handed   Caffeine: 2-4 cups of coffee daily   Social Determinants of Health   Financial Resource Strain: Low Risk  (05/13/2022)   Overall Financial Resource Strain (CARDIA)    Difficulty of Paying Living Expenses: Not hard at all  Food Insecurity: No Food Insecurity (05/13/2022)   Hunger Vital Sign    Worried About Running Out of Food in the Last Year: Never true    Ran Out of Food in the Last Year: Never true  Transportation Needs: No Transportation  Needs (05/13/2022)   PRAPARE - Hydrologist (Medical): No    Lack of Transportation (Non-Medical): No  Physical Activity: Insufficiently Active (05/13/2022)   Exercise Vital Sign    Days of Exercise per Week: 3 days    Minutes of Exercise per Session: 30 min  Stress: No Stress Concern Present (05/13/2022)   [place]    Feeling of Stress : Not at all  Social Connections: Moderately Integrated (05/13/2022)   Social Connection and Isolation Panel [NHANES]    Frequency of Communication with Friends and Family: More than three times a week    Frequency of Social Gatherings with Friends and Family: More than three times a week    Attends Religious Services: More than 4 times per year    Active Member of [employer] or Organizations: No    Attends Music therapist: Never    Marital Status: Married    Tobacco Counseling Counseling given: Not Answered   Clinical Intake:  Pre-visit preparation completed: Yes  Pain : No/denies pain     Nutritional Risks: None Diabetes: No  How often do you need to have someone help you when you read instructions, pamphlets, or other written materials from your doctor or pharmacy?: 1 - Never  Diabetic?no   Interpreter Needed?: No  Information entered by :: Jadene Pierini, LPN   Activities of Daily Living    05/13/2022    2:14 PM 09/08/2021     1:00 PM  In your present state of health, do you have any difficulty performing the following activities:  Hearing? 0   Vision? 0   Difficulty concentrating or making decisions? 0   Walking or climbing stairs? 0   Dressing or bathing? 0   Doing errands, shopping? 0 0  Preparing Food and eating ? N   Using the Toilet? N   In the past six months, have you accidently leaked urine? N   Do you have problems with loss of bowel control? N   Managing your Medications? N   Managing your Finances? N   Housekeeping or managing your Housekeeping? N     Patient Care Team: Baruch Gouty, FNP as PCP - General (Family Medicine) Irene Limbo, MD as Consulting Physician (Plastic Surgery)  Indicate any recent Medical Services you may have received from other than [facility] providers in the past year (date may be approximate).     Assessment:   This is a routine wellness examination for Cassandra Mcconnell.  Hearing/Vision screen Vision Screening - Comments:: Wears rx glasses - up to date with routine eye exams with  Dr.martin  Dietary issues and exercise activities discussed: Current Exercise Habits: Home exercise routine, Type of exercise: walking, Time (Minutes): 30, Frequency (Times/Week): 3, Weekly Exercise (Minutes/Week): 90, Intensity: Mild, Exercise limited by: None identified   Goals Addressed             This Visit's Progress    Exercise 3x per week (30 min per time)   On track      Depression Screen    05/13/2022    2:12 PM 02/23/2022    2:31 PM 05/12/2021    1:25 PM 05/05/2021    2:39 PM 09/12/2016   11:53 AM 05/31/2016   10:29 AM 04/13/2016    8:22 AM  PHQ 2/9 Scores  PHQ - 2 Score 0 6 0 0 6 0 0  PHQ- 9 Score  '24 3 3 '$ 17  Fall Risk    05/13/2022    2:10 PM 02/23/2022    2:31 PM 05/12/2021    2:18 PM 05/05/2021    2:40 PM 05/09/2018    3:05 PM  Fall Risk   Falls in the past year? '1 1 1 1 1  '$ Number falls in past yr: '1 1 1 1 1  '$ Injury with Fall? '1 1 1 1 1  '$ Risk for fall due  to : History of fall(s);Impaired balance/gait;Orthopedic patient  History of fall(s);Impaired balance/gait;Orthopedic patient    Follow up Education provided;Falls prevention discussed Falls prevention discussed Education provided;Falls prevention discussed      FALL RISK PREVENTION PERTAINING TO THE HOME:  Any stairs in or around the home? Yes  If so, are there any without handrails? No  Home free of loose throw rugs in walkways, pet beds, electrical cords, etc? Yes  Adequate lighting in your home to reduce risk of falls? Yes   ASSISTIVE DEVICES UTILIZED TO PREVENT FALLS:  Life alert? No  Use of a cane, walker or w/c? Yes  Grab bars in the bathroom? No  Shower chair or bench in shower? Yes  Elevated toilet seat or a handicapped toilet? No          05/13/2022    2:14 PM 05/12/2021    2:22 PM  6CIT Screen  What Year? 0 points 0 points  What month? 0 points 0 points  What time? 0 points 0 points  Count back from 20 0 points 0 points  Months in reverse 0 points 0 points  Repeat phrase 0 points 0 points  Total Score 0 points 0 points    Immunizations Immunization History  Administered Date(s) Administered   Influenza,inj,Quad PF,6+ Mos 12/30/2015, 02/23/2022   Influenza-Unspecified 04/24/2018   PFIZER(Purple Top)SARS-COV-2 Vaccination 09/10/2019, 10/02/2019   PPD Test 11/15/2019   Td 03/03/2008   Tdap 02/23/2022    TDAP status: Up to date  Flu Vaccine status: Up to date  Pneumococcal vaccine status: Due, Education has been provided regarding the importance of this vaccine. Advised may receive this vaccine at local pharmacy or Health Dept. Aware to provide a copy of the vaccination record if obtained from local pharmacy or Health Dept. Verbalized acceptance and understanding.  Covid-19 vaccine status: Completed vaccines  Qualifies for Shingles Vaccine? Yes   Zostavax completed No   Shingrix Completed?: No.    Education has been provided regarding the importance of  this vaccine. Patient has been advised to call insurance company to determine out of pocket expense if they have not yet received this vaccine. Advised may also receive vaccine at local pharmacy or Health Dept. Verbalized acceptance and understanding.  Screening Tests Health Maintenance  Topic Date Due   Hepatitis C Screening  Never done   PAP SMEAR-Modifier  12/25/2002   COVID-19 Vaccine (3 - 2023-24 season) 12/24/2021   Zoster Vaccines- Shingrix (1 of 2) 05/26/2022 (Originally 08/09/2014)   COLONOSCOPY (Pts 45-83yr Insurance coverage will need to be confirmed)  08/07/2024 (Originally 08/08/2009)   HIV Screening  01/11/2049 (Originally 08/09/1979)   MAMMOGRAM  05/31/2022   Medicare Annual Wellness (AWV)  05/14/2023   DTaP/Tdap/Td (3 - Td or Tdap) 02/24/2032   INFLUENZA VACCINE  Completed   HPV VACCINES  Aged Out    Health Maintenance  Health Maintenance Due  Topic Date Due   Hepatitis C Screening  Never done   PAP SMEAR-Modifier  12/25/2002   COVID-19 Vaccine (3 - 2023-24 season) 12/24/2021  Colorectal cancer screening: Referral to GI placed wants to discuss with PCP . Pt aware the office will call re: appt.  Mammogram status: Ordered wants to discuss with PCP . Pt provided with contact info and advised to call to schedule appt.   Bone Density status: Ordered not of age . Pt provided with contact info and advised to call to schedule appt.  Lung Cancer Screening: (Low Dose CT Chest recommended if Age 58-80 years, 30 pack-year currently smoking OR have quit w/in 15years.) does not qualify.   Lung Cancer Screening Referral: n/a  Additional Screening:  Hepatitis C Screening: does not qualify;   Vision Screening: Recommended annual ophthalmology exams for early detection of glaucoma and other disorders of the eye. Is the patient up to date with their annual eye exam?  Yes  Who is the provider or what is the name of the office in which the patient attends annual eye exams?  Dr.Martin  If pt is not established with a provider, would they like to be referred to a provider to establish care? No .   Dental Screening: Recommended annual dental exams for proper oral hygiene  Community Resource Referral / Chronic Care Management: CRR required this visit?  No   CCM required this visit?  No      Plan:     I have personally reviewed and noted the following in the patient's chart:   Medical and social history Use of alcohol, tobacco or illicit drugs  Current medications and supplements including opioid prescriptions. Patient is not currently taking opioid prescriptions. Functional ability and status Nutritional status Physical activity Advanced directives List of other physicians Hospitalizations, surgeries, and ER visits in previous 12 months Vitals Screenings to include cognitive, depression, and falls Referrals and appointments  In addition, I have reviewed and discussed with patient certain preventive protocols, quality metrics, and best practice recommendations. A written personalized care plan for preventive services as well as general preventive health recommendations were provided to patient.     Daphane Shepherd, LPN   [id number]/90/3833   Nurse Notes: Patient declines referrals for Colonoscopy and mammogram states wants to wait to discuss with PCP in upcoming visit

## 2022-05-25 ENCOUNTER — Telehealth (INDEPENDENT_AMBULATORY_CARE_PROVIDER_SITE_OTHER): Payer: Medicare HMO | Admitting: Family Medicine

## 2022-05-25 ENCOUNTER — Encounter: Payer: Self-pay | Admitting: Family Medicine

## 2022-05-25 DIAGNOSIS — Z7689 Persons encountering health services in other specified circumstances: Secondary | ICD-10-CM | POA: Diagnosis not present

## 2022-05-25 DIAGNOSIS — I10 Essential (primary) hypertension: Secondary | ICD-10-CM

## 2022-05-25 DIAGNOSIS — E7800 Pure hypercholesterolemia, unspecified: Secondary | ICD-10-CM | POA: Diagnosis not present

## 2022-05-25 MED ORDER — ZEPBOUND 2.5 MG/0.5ML ~~LOC~~ SOAJ: 2.5000 mg | SUBCUTANEOUS | 0 refills | Status: AC

## 2022-05-25 NOTE — Progress Notes (Signed)
Virtual Visit via telephone Note Due to COVID-19 pandemic this visit was conducted virtually. This visit type was conducted due to national recommendations for restrictions regarding the COVID-19 Pandemic (e.g. social distancing, sheltering in place) in an effort to limit this patient's exposure and mitigate transmission in our community. All issues noted in this document were discussed and addressed.  A physical exam was not performed with this format.   I connected with Cassandra Mcconnell on 05/25/2022 at 1105 by telephone and verified that I am speaking with the correct person using two identifiers. VIRJEAN BOMAN is currently located at home and patient is currently with them during visit. The provider, Monia Pouch, FNP is located in their office at time of visit.  I discussed the limitations, risks, security and privacy concerns of performing an evaluation and management service by virtual visit and the availability of in person appointments. I also discussed with the patient that there may be a patient responsible charge related to this service. The patient expressed understanding and agreed to proceed.  Subjective:  Patient ID: Cassandra Mcconnell, female    DOB: March 18, 1966, 57 y.o.   MRN: [id number]  Chief Complaint:  Obesity   HPI: Cassandra Mcconnell is a 57 y.o. female presenting on 05/25/2022 for Obesity   Pt presents today for weight management. She has been dieting and exercising daily with a noted 70 lb weight loss. She has been stuck for the last several weeks. Has not changed her routine but can not seem to drop more weight. She is working hard daily and can not get to goal BMI. She has significant comorbidities of hypertension and hyperlipidemia. No family or personal history ro medullary thyroid cancer. No other associated symptoms.      Relevant past medical, surgical, family, and social history reviewed and updated as indicated.  Allergies and medications reviewed and  updated.   Past Medical History:  Diagnosis Date   Ankle fracture    left   Anxiety    Arthritis    Back pain    Bell's palsy    Left side, 2009, 2017   Bell's palsy    Depression    Diverticulitis    Dyspnea    GERD (gastroesophageal reflux disease)    Headache    History of hiatal hernia    Hyperlipidemia    Hypertension    Postoperative nausea 04/21/2016   Toe fracture    left great toe   Wears glasses     Past Surgical History:  Procedure Laterality Date   BACK SURGERY  2004   l4/5 fusion   BREAST REDUCTION SURGERY Bilateral 08/08/2014   Procedure: BILATERAL BREAST REDUCTION  (BREAST);  Surgeon: Irene Limbo, MD;  Location: [facility];  Service: Plastics;  Laterality: Bilateral;   CATARACT EXTRACTION Bilateral    CERVICAL DISCECTOMY  12/12/2018   3 level    CHOLECYSTECTOMY     1998   INJECTION KNEE Left    KNEE ARTHROSCOPY     LAPAROSCOPIC BILATERAL SALPINGO OOPHERECTOMY Bilateral 03/11/2015   Procedure: LAPAROSCOPIC BILATERAL SALPINGO OOPHORECTOMY;  Surgeon: Florian Buff, MD;  Location: [facility];  Service: Gynecology;  Laterality: Bilateral;   LAPAROSCOPIC GASTRIC SLEEVE RESECTION WITH HIATAL HERNIA REPAIR N/A 03/22/2016   Procedure: LAPAROSCOPIC GASTRIC SLEEVE RESECTION WITH HIATAL HERNIA REPAIR WITHUPPER ENDOSCOPY;  Surgeon: Alphonsa Overall, MD;  Location: [facility];  Service: General;  Laterality: N/A;   REDUCTION MAMMAPLASTY Bilateral    SEPTOPLASTY  1988  tonsillectomy  1991   TONSILLECTOMY      Social History   Socioeconomic History   Marital status: Married    Spouse name: Not on file   Number of children: 1   Years of education: Not on file   Highest education level: Not on file  Occupational History   Occupation: stay at home mom  Tobacco Use   Smoking status: Never   Smokeless tobacco: Never  Vaping Use   Vaping Use: Never used  Substance and Sexual Activity   Alcohol use: No   Drug use: No   Sexual activity: Not  Currently    Partners: Male    Birth control/protection: Post-menopausal, Surgical    Comment: BSO 2016  Other Topics Concern   Not on file  Social History Narrative   Lives at home with her husband   Left handed   Caffeine: 2-4 cups of coffee daily   Social Determinants of Health   Financial Resource Strain: Low Risk  (05/13/2022)   Overall Financial Resource Strain (CARDIA)    Difficulty of Paying Living Expenses: Not hard at all  Food Insecurity: No Food Insecurity (05/13/2022)   Hunger Vital Sign    Worried About Running Out of Food in the Last Year: Never true    Ran Out of Food in the Last Year: Never true  Transportation Needs: No Transportation Needs (05/13/2022)   PRAPARE - Hydrologist (Medical): No    Lack of Transportation (Non-Medical): No  Physical Activity: Insufficiently Active (05/13/2022)   Exercise Vital Sign    Days of Exercise per Week: 3 days    Minutes of Exercise per Session: 30 min  Stress: No Stress Concern Present (05/13/2022)   [place]    Feeling of Stress : Not at all  Social Connections: Moderately Integrated (05/13/2022)   Social Connection and Isolation Panel [NHANES]    Frequency of Communication with Friends and Family: More than three times a week    Frequency of Social Gatherings with Friends and Family: More than three times a week    Attends Religious Services: More than 4 times per year    Active Member of [employer] or Organizations: No    Attends Archivist Meetings: Never    Marital Status: Married  Human resources officer Violence: Not At Risk (05/13/2022)   Humiliation, Afraid, Rape, and Kick questionnaire    Fear of Current or Ex-Partner: No    Emotionally Abused: No    Physically Abused: No    Sexually Abused: No    Outpatient Encounter Medications as of 05/25/2022  Medication Sig   tirzepatide (ZEPBOUND) 2.5 MG/0.5ML Pen Inject 2.5 mg  into the skin once a week for 4 doses.   acetaminophen (TYLENOL) 500 MG tablet Take 1,000 mg by mouth every 6 (six) hours as needed. For pain    atorvastatin (LIPITOR) 20 MG tablet Take 1 tablet (20 mg total) by mouth daily.   citalopram (CELEXA) 20 MG tablet Take 1 tablet (20 mg total) by mouth 2 (two) times a day. (Patient taking differently: Take 20 mg by mouth daily.)   desvenlafaxine (PRISTIQ) 50 MG 24 hr tablet Take 100 mg by mouth at bedtime.   ibuprofen (ADVIL) 200 MG tablet Take 800-1,200 mg by mouth every 8 (eight) hours as needed.   Olopatadine HCl 0.2 % SOLN Place 1 drop into both eyes daily as needed (allergies).   promethazine (PHENERGAN) 25 MG  tablet Take 1 tablet (25 mg total) by mouth every 8 (eight) hours as needed for nausea or vomiting.   Propylene Glycol (SYSTANE BALANCE) 0.6 % SOLN Apply 1 drop to eye daily as needed (dry eyes).   tiZANidine (ZANAFLEX) 4 MG tablet Take 1 tablet (4 mg total) by mouth every 8 (eight) hours as needed for muscle spasms.   topiramate (TOPAMAX) 50 MG tablet Take 50 mg by mouth 2 (two) times daily.   No facility-administered encounter medications on file as of 05/25/2022.    Allergies  Allergen Reactions   Zofran [Ondansetron Hcl] Shortness Of Breath and Rash    Numbness   Azithromycin Swelling   Conjugated Estrogens Other (See Comments)    emotional   Cyclobenzaprine Hcl     REACTION: disorientation   Hydrocodone     Other reaction(s): Unknown   Misc. Sulfonamide Containing Compounds Other (See Comments)   Norco [Hydrocodone-Acetaminophen] Other (See Comments)    Felt like she was going through withdrawal   Nsaids Other (See Comments)    Cannot have because of bariatric surgery   Oxycontin [Oxycodone]     Withdrawal feeling    Percocet [Oxycodone-Acetaminophen]     Hypotension    Robaxin [Methocarbamol]     Pt does not remember reaction    Rosuvastatin Dermatitis   Sulfa Antibiotics Hives and Itching   Wellbutrin [Bupropion Hcl]  Other (See Comments)    Emotional, makes depression worse   Ciprofloxacin Itching and Rash   Dicyclomine Hcl Itching and Rash    Review of Systems  Constitutional:  Negative for activity change, appetite change, chills, diaphoresis, fatigue, fever and unexpected weight change.  HENT: Negative.    Eyes: Negative.  Negative for photophobia and visual disturbance.  Respiratory:  Negative for cough, chest tightness and shortness of breath.   Cardiovascular:  Negative for chest pain, palpitations and leg swelling.  Gastrointestinal:  Negative for abdominal pain, blood in stool, constipation, diarrhea, nausea and vomiting.  Endocrine: Negative.   Genitourinary:  Negative for decreased urine volume, difficulty urinating, dysuria, frequency and urgency.  Musculoskeletal:  Negative for arthralgias and myalgias.  Skin: Negative.   Allergic/Immunologic: Negative.   Neurological:  Negative for dizziness, tremors, seizures, syncope, facial asymmetry, speech difficulty, weakness, light-headedness, numbness and headaches.  Hematological: Negative.   Psychiatric/Behavioral:  Negative for confusion, hallucinations, sleep disturbance and suicidal ideas.   All other systems reviewed and are negative.        Observations/Objective: No vital signs or physical exam, this was a virtual health encounter.  Pt alert and oriented, answers all questions appropriately, and able to speak in full sentences.    Assessment and Plan: Jannette was seen today for obesity.  Diagnoses and all orders for this visit:  Morbid obesity ([place]) HYPERCHOLESTEROLEMIA, PURE HYPERTENSION, BENIGN ESSENTIAL Encounter for weight management Has done great with diet and exercise alone, down 70 lbs. Has been stuck at 196 lbs for over 3 weeks and has not changed routine. Due to underlying hypertension and hyperlipidemia, pt would benefit from additional weight loss with the assistance of Zepbound. No personal or family history of MTC.  Has tried phentermine in the past and this caused anxiety and elevated blood pressure readings.  -     tirzepatide (ZEPBOUND) 2.5 MG/0.5ML Pen; Inject 2.5 mg into the skin once a week for 4 doses.     Follow Up Instructions: Return in about 8 weeks (around 07/20/2022) for BMI.    I discussed the assessment and treatment plan  with the patient. The patient was provided an opportunity to ask questions and all were answered. The patient agreed with the plan and demonstrated an understanding of the instructions.   The patient was advised to call back or seek an in-person evaluation if the symptoms worsen or if the condition fails to improve as anticipated.  The above assessment and management plan was discussed with the patient. The patient verbalized understanding of and has agreed to the management plan. Patient is aware to call the clinic if they develop any new symptoms or if symptoms persist or worsen. Patient is aware when to return to the clinic for a follow-up visit. Patient educated on when it is appropriate to go to the emergency department.    I provided 15 minutes of time during this telephone encounter.   Monia Pouch, FNP-C [facility] [address] [place], [place] [address] [phone] 05/25/2022

## 2022-05-26 DIAGNOSIS — Z6833 Body mass index (BMI) 33.0-33.9, adult: Secondary | ICD-10-CM | POA: Diagnosis not present

## 2022-05-26 DIAGNOSIS — M5440 Lumbago with sciatica, unspecified side: Secondary | ICD-10-CM | POA: Diagnosis not present

## 2022-06-02 ENCOUNTER — Encounter: Payer: Self-pay | Admitting: Family Medicine

## 2022-07-11 ENCOUNTER — Ambulatory Visit
Admission: EM | Admit: 2022-07-11 | Discharge: 2022-07-11 | Disposition: A | Discharge status: Home / Self Care | Payer: Medicare HMO

## 2022-07-11 DIAGNOSIS — L03113 Cellulitis of right upper limb: Secondary | ICD-10-CM

## 2022-07-11 DIAGNOSIS — S50861A Insect bite (nonvenomous) of right forearm, initial encounter: Secondary | ICD-10-CM | POA: Diagnosis not present

## 2022-07-11 DIAGNOSIS — W57XXXA Bitten or stung by nonvenomous insect and other nonvenomous arthropods, initial encounter: Secondary | ICD-10-CM | POA: Diagnosis not present

## 2022-07-11 MED ORDER — CEPHALEXIN 500 MG PO CAPS: 500.0000 mg | ORAL_CAPSULE | Freq: Two times a day (BID) | ORAL | 0 refills | Status: DC

## 2022-07-11 NOTE — Discharge Instructions (Signed)
Clean the area with soap and water twice daily, keep covered with a patch bandage once you have applied triamcinolone cream to help with the itch.  Take the full course of antibiotics.  Elevate the arm at rest to help with swelling.  Follow-up with your primary care for a recheck, sooner if worsening

## 2022-07-11 NOTE — ED Triage Notes (Signed)
Pt states she got bite by an insect Saturday and now having redness and swelling to the bite on her right forearm. Pt states it is warm to the touch and itchy. Pt is putting anti-itch cream and peroxide but states it does not help.

## 2022-07-12 NOTE — ED Provider Notes (Signed)
[facility]    CSN: [id number] Arrival date & time: 07/11/22  1816      History   Chief Complaint Chief Complaint  Patient presents with   Insect Bite    HPI Cassandra Mcconnell is a 57 y.o. female.   Patient presenting today with an insect bite to the right forearm that occurred 3 days ago, now with redness, swelling, significant itching, clear drainage.  Denies fever, chills, numbness, tingling, decreased range of motion.  Trying anti-itch cream and peroxide with minimal relief.    Past Medical History:  Diagnosis Date   Ankle fracture    left   Anxiety    Arthritis    Back pain    Bell's palsy    Left side, 2009, 2017   Bell's palsy    Depression    Diverticulitis    Dyspnea    GERD (gastroesophageal reflux disease)    Headache    History of hiatal hernia    Hyperlipidemia    Hypertension    Postoperative nausea 04/21/2016   Toe fracture    left great toe   Wears glasses     Patient Active Problem List   Diagnosis Date Noted   Mood disorder ([place]) 02/23/2022   Spinal stenosis of lumbar region 09/17/2021   Osteoarthritis of multiple joints 05/05/2021   Hx of bilateral oophorectomy 05/05/2021   Cervical radiculopathy 04/05/2018   Neurogenic claudication 04/05/2018   Allergic rhinitis 03/14/2017   Depression, recurrent ([place]) 05/31/2013   Morbid obesity ([place]) 12/04/2009   GAD (generalized anxiety disorder) 11/10/2006   GERD 11/10/2006   HYPERCHOLESTEROLEMIA, PURE 11/09/2006   HYPERTENSION, BENIGN ESSENTIAL 11/09/2006   CHOLECYSTECTOMY, HX OF 11/09/2006    Past Surgical History:  Procedure Laterality Date   BACK SURGERY  2004   l4/5 fusion   BREAST REDUCTION SURGERY Bilateral 08/08/2014   Procedure: BILATERAL BREAST REDUCTION  (BREAST);  Surgeon: Irene Limbo, MD;  Location: [facility];  Service: Plastics;  Laterality: Bilateral;   CATARACT EXTRACTION Bilateral    CERVICAL DISCECTOMY  12/12/2018   3 level     CHOLECYSTECTOMY     1998   INJECTION KNEE Left    KNEE ARTHROSCOPY     LAPAROSCOPIC BILATERAL SALPINGO OOPHERECTOMY Bilateral 03/11/2015   Procedure: LAPAROSCOPIC BILATERAL SALPINGO OOPHORECTOMY;  Surgeon: Florian Buff, MD;  Location: [facility];  Service: Gynecology;  Laterality: Bilateral;   LAPAROSCOPIC GASTRIC SLEEVE RESECTION WITH HIATAL HERNIA REPAIR N/A 03/22/2016   Procedure: LAPAROSCOPIC GASTRIC SLEEVE RESECTION WITH HIATAL HERNIA REPAIR WITHUPPER ENDOSCOPY;  Surgeon: Alphonsa Overall, MD;  Location: [facility];  Service: General;  Laterality: N/A;   REDUCTION MAMMAPLASTY Bilateral    SEPTOPLASTY  1988   tonsillectomy  1991   TONSILLECTOMY      OB History     Gravida  1   Para      Term      Preterm      AB  1   Living         SAB  1   IAB      Ectopic      Multiple      Live Births               Home Medications    Prior to Admission medications   Medication Sig Start Date End Date Taking? Authorizing Provider  atorvastatin (LIPITOR) 20 MG tablet Take 1 tablet (20 mg total) by mouth daily. 02/23/22  Yes Rakes, Connye Burkitt, FNP  Biotin 1 MG CAPS Take by mouth. 05/03/18  Yes [provider]  Calcium Carb-Cholecalciferol (CALCIUM HIGH POTENCY/VITAMIN D) 600-5 MG-MCG TABS Take by mouth. 05/03/18  Yes [provider]  cephALEXin (KEFLEX) 500 MG capsule Take 1 capsule (500 mg total) by mouth 2 (two) times daily. 07/11/22  Yes Volney American, PA-C  citalopram (CELEXA) 20 MG tablet Take 1 tablet (20 mg total) by mouth 2 (two) times a day. Patient taking differently: Take 20 mg by mouth daily. 11/22/18  Yes Cirigliano, Mary K, DO  cyanocobalamin (VITAMIN B12) 1000 MCG tablet Take by mouth. 05/03/18  Yes [provider]  desvenlafaxine (PRISTIQ) 50 MG 24 hr tablet Take 100 mg by mouth at bedtime. 02/28/21  Yes [provider]  Multiple Vitamin (MULTIVITAMIN) capsule Take by mouth. 05/03/18  Yes [provider]  Olopatadine HCl 0.2 %  SOLN Place 1 drop into both eyes daily as needed (allergies).   Yes [provider]  Propylene Glycol (SYSTANE BALANCE) 0.6 % SOLN Apply 1 drop to eye daily as needed (dry eyes).   Yes [provider]  tiZANidine (ZANAFLEX) 4 MG tablet Take 1 tablet (4 mg total) by mouth every 8 (eight) hours as needed for muscle spasms. 04/13/21  Yes Hazel Sams, PA-C  acetaminophen (TYLENOL) 500 MG tablet Take 1,000 mg by mouth every 6 (six) hours as needed. For pain     [provider]  ibuprofen (ADVIL) 200 MG tablet Take 800-1,200 mg by mouth every 8 (eight) hours as needed.    [provider]  promethazine (PHENERGAN) 25 MG tablet Take 1 tablet (25 mg total) by mouth every 8 (eight) hours as needed for nausea or vomiting. 06/29/19   Wurst, [place], PA-C  topiramate (TOPAMAX) 50 MG tablet Take 50 mg by mouth 2 (two) times daily. 02/09/22   [provider]    Family History Family History  Problem Relation Age of Onset   COPD Mother    Hypertension Mother    GER disease Mother    Heart attack Father    Prostate cancer Father    Hyperlipidemia Brother    Hypertension Brother    Diabetes Brother    Neuropathy Brother    Hyperlipidemia Brother    Hypertension Brother    Heart attack Paternal Grandmother    Heart attack Paternal Grandfather    Stroke Paternal Grandfather    Cancer Paternal Uncle        prostate and bone   Cancer Paternal Uncle        prostate and bone   Colon cancer Neg Hx    Stomach cancer Neg Hx     Social History Social History   Tobacco Use   Smoking status: Never   Smokeless tobacco: Never  Vaping Use   Vaping Use: Never used  Substance Use Topics   Alcohol use: No   Drug use: No     Allergies   Zofran [ondansetron hcl], Azithromycin, Conjugated estrogens, Cyclobenzaprine hcl, Hydrocodone, Misc. sulfonamide containing compounds, Norco [hydrocodone-acetaminophen], Nsaids, Oxycontin [oxycodone], Percocet  [oxycodone-acetaminophen], Robaxin [methocarbamol], Rosuvastatin, Sulfa antibiotics, Wellbutrin [bupropion hcl], Ciprofloxacin, and Dicyclomine hcl   Review of Systems Review of Systems PER HPI  Physical Exam Triage Vital Signs ED Triage Vitals  Enc Vitals Group     BP 07/11/22 1920 (!) 145/77     Pulse Rate 07/11/22 1920 77     Resp 07/11/22 1920 16     Temp 07/11/22 1920 98 F (36.7 C)  Temp src --      SpO2 07/11/22 1920 99 %     Weight --      Height --      Head Circumference --      Peak Flow --      Pain Score 07/11/22 1921 0     Pain Loc --      Pain Edu? --      Excl. in [place]? --    No data found.  Updated Vital Signs BP (!) 145/77 (BP Location: Right Arm)   Pulse 77   Temp 98 F (36.7 C)   Resp 16   LMP 02/10/2015 Comment: had BSO 2016  SpO2 99%   Visual Acuity Right Eye Distance:   Left Eye Distance:   Bilateral Distance:    Right Eye Near:   Left Eye Near:    Bilateral Near:     Physical Exam Vitals and nursing note reviewed.  Constitutional:      Appearance: Normal appearance. She is not ill-appearing.  HENT:     Head: Atraumatic.  Eyes:     Extraocular Movements: Extraocular movements intact.     Conjunctiva/sclera: Conjunctivae normal.  Cardiovascular:     Rate and Rhythm: Normal rate and regular rhythm.     Heart sounds: Normal heart sounds.  Pulmonary:     Effort: Pulmonary effort is normal.     Breath sounds: Normal breath sounds.  Musculoskeletal:        General: Normal range of motion.     Cervical back: Normal range of motion and neck supple.  Skin:    General: Skin is warm and dry.     Findings: Erythema present. No bruising.     Comments: Small ulcerated lesion to the right forearm, erythematous, mildly edematous, with surrounding erythema both above and below in a linear pattern.  No active drainage or bleeding, no fluctuance or induration  Neurological:     Mental Status: She is alert and oriented to person, place, and  time.     Motor: No weakness.     Gait: Gait normal.     Comments: Right upper extremity neurovascularly intact  Psychiatric:        Mood and Affect: Mood normal.        Thought Content: Thought content normal.        Judgment: Judgment normal.      UC Treatments / Results  Labs (all labs ordered are listed, but only abnormal results are displayed) Labs Reviewed - No data to display  EKG   Radiology No results found.  Procedures Procedures (including critical care time)  Medications Ordered in UC Medications - No data to display  Initial Impression / Assessment and Plan / UC Course  I have reviewed the triage vital signs and the nursing notes.  Pertinent labs & imaging results that were available during my care of the patient were reviewed by me and considered in my medical decision making (see chart for details).     Cover for cellulitis with Keflex, discussed good home wound care, Neosporin, anti-itch creams, keeping covered.  Return for worsening symptoms.  Final Clinical Impressions(s) / UC Diagnoses   Final diagnoses:  Right arm cellulitis  Insect bite of right forearm, initial encounter     Discharge Instructions      Clean the area with soap and water twice daily, keep covered with a patch bandage once you have applied triamcinolone cream to help with the itch.  Take  the full course of antibiotics.  Elevate the arm at rest to help with swelling.  Follow-up with your primary care for a recheck, sooner if worsening     ED Prescriptions     Medication Sig Dispense Auth. Provider   cephALEXin (KEFLEX) 500 MG capsule Take 1 capsule (500 mg total) by mouth 2 (two) times daily. 14 capsule Volney American, [place]      PDMP not reviewed this encounter.   Volney American, [place] 07/12/22 1739

## 2022-07-14 ENCOUNTER — Encounter: Payer: Self-pay | Admitting: Family Medicine

## 2022-07-15 ENCOUNTER — Encounter: Payer: Self-pay | Admitting: Nurse Practitioner

## 2022-07-15 ENCOUNTER — Ambulatory Visit (INDEPENDENT_AMBULATORY_CARE_PROVIDER_SITE_OTHER): Payer: Medicare HMO | Admitting: Nurse Practitioner

## 2022-07-15 ENCOUNTER — Ambulatory Visit: Payer: Medicare HMO | Admitting: Family Medicine

## 2022-07-15 VITALS — BP 130/81 | HR 77 | Temp 97.7°F | Resp 20 | Ht 64.0 in | Wt 202.0 lb

## 2022-07-15 DIAGNOSIS — I10 Essential (primary) hypertension: Secondary | ICD-10-CM | POA: Diagnosis not present

## 2022-07-15 MED ORDER — LISINOPRIL 20 MG PO TABS: 20.0000 mg | ORAL_TABLET | Freq: Every day | ORAL | 1 refills | Status: DC

## 2022-07-15 NOTE — Progress Notes (Signed)
   Subjective:    Patient ID: Cassandra Mcconnell, female    DOB: [birth year], 57 y.o.   MRN: [id number]   Chief Complaint: BP has been going up   HPI  Patient Active Problem List   Diagnosis Date Noted   Mood disorder ([place]) 02/23/2022   Spinal stenosis of lumbar region 09/17/2021   Osteoarthritis of multiple joints 05/05/2021   Hx of bilateral oophorectomy 05/05/2021   Cervical radiculopathy 04/05/2018   Neurogenic claudication 04/05/2018   Allergic rhinitis 03/14/2017   Depression, recurrent ([place]) 05/31/2013   Morbid obesity ([place]) 12/04/2009   GAD (generalized anxiety disorder) 11/10/2006   GERD 11/10/2006   HYPERCHOLESTEROLEMIA, PURE 11/09/2006   HYPERTENSION, BENIGN ESSENTIAL 11/09/2006   CHOLECYSTECTOMY, HX OF 11/09/2006   Patient was starting to feel bad the last several days so she started checking her bloodpressure. Has been running I the [id number] systolic consistently. She use to be on hypertesive meds years ago, but lost weight and blood pressure was good so she stopped taking it.     Review of Systems  Constitutional:  Negative for diaphoresis.  Eyes:  Negative for pain.  Respiratory:  Negative for shortness of breath.   Cardiovascular:  Negative for chest pain, palpitations and leg swelling.  Gastrointestinal:  Negative for abdominal pain.  Endocrine: Negative for polydipsia.  Skin:  Negative for rash.  Neurological:  Negative for dizziness, weakness and headaches.  Hematological:  Does not bruise/bleed easily.  All other systems reviewed and are negative.      Objective:   Physical Exam Vitals reviewed.  Constitutional:      Appearance: Normal appearance.  Cardiovascular:     Rate and Rhythm: Normal rate and regular rhythm.     Heart sounds: Normal heart sounds.  Pulmonary:     Effort: Pulmonary effort is normal.     Breath sounds: Normal breath sounds.  Skin:    General: Skin is warm.  Neurological:     General: No focal deficit present.     Mental  Status: She is alert and oriented to person, place, and time.  Psychiatric:        Mood and Affect: Mood normal.        Behavior: Behavior normal.    BP 130/81   Pulse 77   Temp 97.7 F (36.5 C) (Temporal)   Resp 20   Ht 5\' 4"  (1.626 m)   Wt 202 lb (91.6 kg)   LMP 02/10/2015 Comment: had BSO 2016  SpO2 100%   BMI 34.67 kg/m         Assessment & Plan:   [name] in today with chief complaint of BP has been going up   1. Primary hypertension Start on lisinoril daily Continue to keep diary of blood pressure at home and bring to next visit Low sodium diet - lisinopril (ZESTRIL) 20 MG tablet; Take 1 tablet (20 mg total) by mouth daily.  Dispense: 90 tablet; Refill: 1    The above assessment and management plan was discussed with the patient. The patient verbalized understanding of and has agreed to the management plan. Patient is aware to call the clinic if symptoms persist or worsen. Patient is aware when to return to the clinic for a follow-up visit. Patient educated on when it is appropriate to go to the emergency department.   Mary-Margaret Hassell Done, FNP

## 2022-07-15 NOTE — Patient Instructions (Signed)

## 2022-07-27 ENCOUNTER — Encounter: Payer: Self-pay | Admitting: Family Medicine

## 2022-07-27 ENCOUNTER — Ambulatory Visit (INDEPENDENT_AMBULATORY_CARE_PROVIDER_SITE_OTHER): Payer: Medicare HMO | Admitting: Family Medicine

## 2022-07-27 VITALS — BP 110/70 | HR 81 | Temp 97.9°F | Wt 200.0 lb

## 2022-07-27 DIAGNOSIS — E782 Mixed hyperlipidemia: Secondary | ICD-10-CM

## 2022-07-27 DIAGNOSIS — Z6834 Body mass index (BMI) 34.0-34.9, adult: Secondary | ICD-10-CM | POA: Diagnosis not present

## 2022-07-27 DIAGNOSIS — E559 Vitamin D deficiency, unspecified: Secondary | ICD-10-CM | POA: Insufficient documentation

## 2022-07-27 DIAGNOSIS — G250 Essential tremor: Secondary | ICD-10-CM | POA: Diagnosis not present

## 2022-07-27 DIAGNOSIS — R251 Tremor, unspecified: Secondary | ICD-10-CM

## 2022-07-27 DIAGNOSIS — I10 Essential (primary) hypertension: Secondary | ICD-10-CM

## 2022-07-27 NOTE — Progress Notes (Signed)
Subjective:  Patient ID: Cassandra Mcconnell, female    DOB: 06-22-1965, 57 y.o.   MRN: [id number]  Patient Care Team: Baruch Gouty, FNP as PCP - General (Family Medicine) Irene Limbo, MD as Consulting Physician (Plastic Surgery)   Chief Complaint:  Tremors (Bilateral hands x 2 years and has gotten worse ) and Medical Management of Chronic Issues (Chronic check up )   HPI: Cassandra Mcconnell is a 57 y.o. female presenting on 07/27/2022 for Tremors (Bilateral hands x 2 years and has gotten worse ) and Medical Management of Chronic Issues (Chronic check up )   1. Primary hypertension Complaint with meds - Yes Current Medications - lisinopril Checking BP at home - no Exercising Regularly - No Watching Salt intake - Yes Pertinent ROS:  Headache - No Fatigue - No Visual Disturbances - No Chest pain - No Dyspnea - No Palpitations - No LE edema - No They report good compliance with medications and can restate their regimen by memory. No medication side effects.  BP Readings from Last 3 Encounters:  07/27/22 110/70  07/15/22 130/81  07/11/22 (!) 145/77     2. Vitamin D deficiency Pt is not taking oral repletion therapy. Denies bone pain and tenderness, muscle weakness, fracture, and difficulty walking. Lab Results  Component Value Date   VD25OH 46.6 02/23/2022   VD25OH 34.23 08/22/2016   Lab Results  Component Value Date   CALCIUM 9.9 02/23/2022      3. Morbid obesity Tries to watch diet and has been exercising. Unable to get medications covered by her pharmacy. Would like to explore other options for weight management.   4. Mixed hyperlipidemia Compliant with medications - Yes Current medications - atorvastatin  Side effects from medications - No Diet - generally healthy Exercise - not regular but active daily  5. Tremor of both hands Reports ongoing tremors of both hands. Feels this is worsening. Starting to affect her daily activities such as applying makeup.       Relevant past medical, surgical, family, and social history reviewed and updated as indicated.  Allergies and medications reviewed and updated. Data reviewed: Chart in Epic.   Past Medical History:  Diagnosis Date   Ankle fracture    left   Anxiety    Arthritis    Back pain    Bell's palsy    Left side, 2009, 2017   Bell's palsy    Depression    Diverticulitis    Dyspnea    GERD (gastroesophageal reflux disease)    Headache    History of hiatal hernia    Hyperlipidemia    Hypertension    Postoperative nausea 04/21/2016   Toe fracture    left great toe   Wears glasses     Past Surgical History:  Procedure Laterality Date   BACK SURGERY  2004   l4/5 fusion   BREAST REDUCTION SURGERY Bilateral 08/08/2014   Procedure: BILATERAL BREAST REDUCTION  (BREAST);  Surgeon: Irene Limbo, MD;  Location: [facility];  Service: Plastics;  Laterality: Bilateral;   CATARACT EXTRACTION Bilateral    CERVICAL DISCECTOMY  12/12/2018   3 level    CHOLECYSTECTOMY     1998   INJECTION KNEE Left    KNEE ARTHROSCOPY     LAPAROSCOPIC BILATERAL SALPINGO OOPHERECTOMY Bilateral 03/11/2015   Procedure: LAPAROSCOPIC BILATERAL SALPINGO OOPHORECTOMY;  Surgeon: Florian Buff, MD;  Location: [facility];  Service: Gynecology;  Laterality: Bilateral;   LAPAROSCOPIC GASTRIC  SLEEVE RESECTION WITH HIATAL HERNIA REPAIR N/A 03/22/2016   Procedure: LAPAROSCOPIC GASTRIC SLEEVE RESECTION WITH HIATAL HERNIA REPAIR WITHUPPER ENDOSCOPY;  Surgeon: Alphonsa Overall, MD;  Location: [facility];  Service: General;  Laterality: N/A;   REDUCTION MAMMAPLASTY Bilateral    SEPTOPLASTY  1988   tonsillectomy  1991   TONSILLECTOMY      Social History   Socioeconomic History   Marital status: Married    Spouse name: Not on file   Number of children: 1   Years of education: Not on file   Highest education level: 12th grade  Occupational History   Occupation: stay at home mom  Tobacco Use   Smoking  status: Never   Smokeless tobacco: Never  Vaping Use   Vaping Use: Never used  Substance and Sexual Activity   Alcohol use: No   Drug use: No   Sexual activity: Not Currently    Partners: Male    Birth control/protection: Post-menopausal, Surgical    Comment: BSO 2016  Other Topics Concern   Not on file  Social History Narrative   Lives at home with her husband   Left handed   Caffeine: 2-4 cups of coffee daily   Social Determinants of Health   Financial Resource Strain: Low Risk  (07/27/2022)   Overall Financial Resource Strain (CARDIA)    Difficulty of Paying Living Expenses: Not hard at all  Food Insecurity: No Food Insecurity (07/27/2022)   Hunger Vital Sign    Worried About Running Out of Food in the Last Year: Never true    Ran Out of Food in the Last Year: Never true  Transportation Needs: No Transportation Needs (07/27/2022)   PRAPARE - Hydrologist (Medical): No    Lack of Transportation (Non-Medical): No  Physical Activity: Inactive (07/27/2022)   Exercise Vital Sign    Days of Exercise per Week: 0 days    Minutes of Exercise per Session: 30 min  Stress: Stress Concern Present (07/27/2022)   [place]    Feeling of Stress : Rather much  Social Connections: Moderately Integrated (07/27/2022)   Social Connection and Isolation Panel [NHANES]    Frequency of Communication with Friends and Family: Once a week    Frequency of Social Gatherings with Friends and Family: Never    Attends Religious Services: More than 4 times per year    Active Member of [employer] or Organizations: Yes    Attends Archivist Meetings: 1 to 4 times per year    Marital Status: Married  Human resources officer Violence: Not At Risk (05/13/2022)   Humiliation, Afraid, Rape, and Kick questionnaire    Fear of Current or Ex-Partner: No    Emotionally Abused: No    Physically Abused: No    Sexually Abused: No     Outpatient Encounter Medications as of 07/27/2022  Medication Sig   acetaminophen (TYLENOL) 500 MG tablet Take 1,000 mg by mouth every 6 (six) hours as needed. For pain    atorvastatin (LIPITOR) 20 MG tablet Take 1 tablet (20 mg total) by mouth daily.   Biotin 1 MG CAPS Take by mouth.   Calcium Carb-Cholecalciferol (CALCIUM HIGH POTENCY/VITAMIN D) 600-5 MG-MCG TABS Take by mouth.   citalopram (CELEXA) 20 MG tablet Take 1 tablet (20 mg total) by mouth 2 (two) times a day. (Patient taking differently: Take 20 mg by mouth daily.)   cyanocobalamin (VITAMIN B12) 1000 MCG tablet Take by mouth.  desvenlafaxine (PRISTIQ) 50 MG 24 hr tablet Take 100 mg by mouth at bedtime.   ibuprofen (ADVIL) 200 MG tablet Take 800-1,200 mg by mouth every 8 (eight) hours as needed.   lisinopril (ZESTRIL) 20 MG tablet Take 1 tablet (20 mg total) by mouth daily.   Multiple Vitamin (MULTIVITAMIN) capsule Take by mouth.   Olopatadine HCl 0.2 % SOLN Place 1 drop into both eyes daily as needed (allergies).   promethazine (PHENERGAN) 25 MG tablet Take 1 tablet (25 mg total) by mouth every 8 (eight) hours as needed for nausea or vomiting.   Propylene Glycol (SYSTANE BALANCE) 0.6 % SOLN Apply 1 drop to eye daily as needed (dry eyes).   tiZANidine (ZANAFLEX) 4 MG tablet Take 1 tablet (4 mg total) by mouth every 8 (eight) hours as needed for muscle spasms.   [DISCONTINUED] cephALEXin (KEFLEX) 500 MG capsule Take 1 capsule (500 mg total) by mouth 2 (two) times daily. (Patient not taking: Reported on 07/15/2022)   [DISCONTINUED] topiramate (TOPAMAX) 50 MG tablet Take 50 mg by mouth 2 (two) times daily. (Patient not taking: Reported on 07/27/2022)   No facility-administered encounter medications on file as of 07/27/2022.    Allergies  Allergen Reactions   Zofran [Ondansetron Hcl] Shortness Of Breath and Rash    Numbness   Azithromycin Swelling   Conjugated Estrogens Other (See Comments)    emotional   Cyclobenzaprine Hcl      REACTION: disorientation   Hydrocodone     Other reaction(s): Unknown   Misc. Sulfonamide Containing Compounds Other (See Comments)   Norco [Hydrocodone-Acetaminophen] Other (See Comments)    Felt like she was going through withdrawal   Nsaids Other (See Comments)    Cannot have because of bariatric surgery   Oxycontin [Oxycodone]     Withdrawal feeling    Percocet [Oxycodone-Acetaminophen]     Hypotension    Robaxin [Methocarbamol]     Pt does not remember reaction    Rosuvastatin Dermatitis   Sulfa Antibiotics Hives and Itching   Wellbutrin [Bupropion Hcl] Other (See Comments)    Emotional, makes depression worse   Ciprofloxacin Itching and Rash   Dicyclomine Hcl Itching and Rash    Review of Systems  Constitutional:  Negative for activity change, appetite change, chills, diaphoresis, fatigue, fever and unexpected weight change.  HENT: Negative.    Eyes: Negative.  Negative for photophobia and visual disturbance.  Respiratory:  Negative for cough, chest tightness and shortness of breath.   Cardiovascular:  Negative for chest pain, palpitations and leg swelling.  Gastrointestinal:  Negative for abdominal pain, blood in stool, constipation, diarrhea, nausea and vomiting.  Endocrine: Negative.   Genitourinary:  Negative for decreased urine volume, difficulty urinating, dysuria, frequency and urgency.  Musculoskeletal:  Negative for arthralgias and myalgias.  Skin: Negative.   Allergic/Immunologic: Negative.   Neurological:  Positive for tremors and facial asymmetry (chronic). Negative for dizziness, seizures, syncope, speech difficulty, weakness, light-headedness, numbness and headaches.  Hematological: Negative.   Psychiatric/Behavioral:  Negative for confusion, hallucinations, sleep disturbance and suicidal ideas.   All other systems reviewed and are negative.       Objective:  BP 110/70   Pulse 81   Temp 97.9 F (36.6 C) (Temporal)   Wt 200 lb (90.7 kg) Comment:  refused  LMP 02/10/2015 Comment: had BSO 2016  SpO2 99%   BMI 34.33 kg/m    Wt Readings from Last 3 Encounters:  07/27/22 200 lb (90.7 kg)  07/15/22 202 lb (91.6 kg)  05/13/22 197 lb (89.4 kg)    Physical Exam Vitals and nursing note reviewed.  Constitutional:      General: She is not in acute distress.    Appearance: Normal appearance. She is well-developed and well-groomed. She is obese. She is not ill-appearing, toxic-appearing or diaphoretic.  HENT:     Head: Normocephalic and atraumatic.     Jaw: There is normal jaw occlusion.     Right Ear: Hearing normal.     Left Ear: Hearing normal.     Nose: Nose normal.     Mouth/Throat:     Lips: Pink.     Mouth: Mucous membranes are moist.     Pharynx: Oropharynx is clear. Uvula midline.  Eyes:     General: Lids are normal.     Extraocular Movements: Extraocular movements intact.     Conjunctiva/sclera: Conjunctivae normal.     Pupils: Pupils are equal, round, and reactive to light.  Neck:     Thyroid: No thyroid mass, thyromegaly or thyroid tenderness.     Vascular: No carotid bruit or JVD.     Trachea: Trachea and phonation normal.  Cardiovascular:     Rate and Rhythm: Normal rate and regular rhythm.     Chest Wall: PMI is not displaced.     Pulses: Normal pulses.     Heart sounds: Normal heart sounds. No murmur heard.    No friction rub. No gallop.  Pulmonary:     Effort: Pulmonary effort is normal. No respiratory distress.     Breath sounds: Normal breath sounds. No wheezing.  Abdominal:     General: Bowel sounds are normal. There is no distension or abdominal bruit.     Palpations: Abdomen is soft. There is no hepatomegaly or splenomegaly.     Tenderness: There is no abdominal tenderness. There is no right CVA tenderness or left CVA tenderness.     Hernia: No hernia is present.  Musculoskeletal:        General: Normal range of motion.     Cervical back: Normal range of motion and neck supple.     Right lower leg:  No edema.     Left lower leg: No edema.  Lymphadenopathy:     Cervical: No cervical adenopathy.  Skin:    General: Skin is warm and dry.     Capillary Refill: Capillary refill takes less than 2 seconds.     Coloration: Skin is not cyanotic, jaundiced or pale.     Findings: No rash.  Neurological:     General: No focal deficit present.     Mental Status: She is alert and oriented to person, place, and time.     Cranial Nerves: Facial asymmetry (chronic) present.     Sensory: Sensation is intact.     Motor: Tremor (bilateral hands, resting) present.     Coordination: Coordination is intact.     Gait: Gait is intact.     Deep Tendon Reflexes: Reflexes are normal and symmetric.  Psychiatric:        Attention and Perception: Attention and perception normal.        Mood and Affect: Mood and affect normal.        Speech: Speech normal.        Behavior: Behavior normal. Behavior is cooperative.        Thought Content: Thought content normal.        Cognition and Memory: Cognition and memory normal.  Judgment: Judgment normal.     Results for orders placed or performed in visit on 03/09/22  Lipid panel  Result Value Ref Range   Cholesterol, Total 155 100 - 199 mg/dL   Triglycerides 193 (H) 0 - 149 mg/dL   HDL 36 (L) >39 mg/dL   VLDL Cholesterol Cal 33 5 - 40 mg/dL   LDL Chol Calc (NIH) 86 0 - 99 mg/dL   Chol/HDL Ratio 4.3 0.0 - 4.4 ratio       Pertinent labs & imaging results that were available during my care of the patient were reviewed by me and considered in my medical decision making.  Assessment & Plan:  Cassandra Mcconnell was seen today for tremors and medical management of chronic issues.  Diagnoses and all orders for this visit:  Primary hypertension BP well controlled. Changes were not made in regimen today. Goal BP is 130/80. Pt aware to report any persistent high or low readings. DASH diet and exercise encouraged. Exercise at least 150 minutes per week and increase as  tolerated. Goal BMI > 25. Stress management encouraged. Avoid nicotine and tobacco product use. Avoid excessive alcohol and NSAID's. Avoid more than 2000 mg of sodium daily. Medications as prescribed. Follow up as scheduled.  -     CMP14+EGFR -     CBC with Differential/Platelet -     Thyroid Panel With TSH  Vitamin D  Labs pending. If indicated, will start repletion therapy. Eat foods rich in Vit D including milk, orange juice, yogurt with vitamin D added, salmon or mackerel, canned tuna fish, cereals with vitamin D added, and cod liver oil. Get out in the sun but make sure to wear at least SPF 30 sunscreen.  -     VITAMIN D 25 Hydroxy (Vit-D Deficiency, Fractures)  Morbid obesity Diet and exercise encouraged. Labs pending.  -     CMP14+EGFR -     CBC with Differential/Platelet -     Lipid panel -     Thyroid Panel With TSH  Mixed hyperlipidemia Diet encouraged - increase intake of fresh fruits and vegetables, increase intake of lean proteins. Bake, broil, or grill foods. Avoid fried, greasy, and fatty foods. Avoid fast foods. Increase intake of fiber-rich whole grains. Exercise encouraged - at least 150 minutes per week and advance as tolerated.  Goal BMI < 25. Continue medications as prescribed. Follow up in 3-6 months as discussed.  -     CMP14+EGFR -     Lipid panel  Tremor of both hands This is new and has been worsening. Labs pending. Referral to neurology for further evaluation.  -     CMP14+EGFR -     Thyroid Panel With TSH     Continue all other maintenance medications.  Follow up plan: Return in about 6 weeks (around 09/07/2022), or if symptoms worsen or fail to improve, for BMI.   Continue healthy lifestyle choices, including diet (rich in fruits, vegetables, and lean proteins, and low in salt and simple carbohydrates) and exercise (at least 30 minutes of moderate physical activity daily).  Educational handout given for DASH diet, HTN  The above assessment and  management plan was discussed with the patient. The patient verbalized understanding of and has agreed to the management plan. Patient is aware to call the clinic if they develop any new symptoms or if symptoms persist or worsen. Patient is aware when to return to the clinic for a follow-up visit. Patient educated on when it is appropriate to  go to the emergency department.   Monia Pouch, FNP-C [facility] [phone]

## 2022-07-27 NOTE — Patient Instructions (Signed)

## 2022-07-28 ENCOUNTER — Encounter: Payer: Self-pay | Admitting: Family Medicine

## 2022-07-28 DIAGNOSIS — F6381 Intermittent explosive disorder: Secondary | ICD-10-CM | POA: Diagnosis not present

## 2022-07-28 DIAGNOSIS — F411 Generalized anxiety disorder: Secondary | ICD-10-CM | POA: Diagnosis not present

## 2022-07-28 DIAGNOSIS — F331 Major depressive disorder, recurrent, moderate: Secondary | ICD-10-CM | POA: Diagnosis not present

## 2022-07-28 LAB — LIPID PANEL
Chol/HDL Ratio: 4.2 ratio (ref 0.0–4.4)
Cholesterol, Total: 177 mg/dL (ref 100–199)
HDL: 42 mg/dL (ref >39)
LDL Chol Calc (NIH): 113 mg/dL — ABNORMAL HIGH (ref 0–99)
Triglycerides: 124 mg/dL (ref 0–149)
VLDL Cholesterol Cal: 22 mg/dL (ref 5–40)

## 2022-07-28 LAB — CMP14+EGFR
ALT: 23 IU/L (ref 0–32)
AST: 34 IU/L (ref 0–40)
Albumin/Globulin Ratio: 1.8 (ref 1.2–2.2)
Albumin: 4.4 g/dL (ref 3.8–4.9)
Alkaline Phosphatase: 102 IU/L (ref 44–121)
BUN/Creatinine Ratio: 12 (ref 9–23)
BUN: 11 mg/dL (ref 6–24)
Bilirubin Total: 0.5 mg/dL (ref 0.0–1.2)
CO2: 21 mmol/L (ref 20–29)
Calcium: 10.3 mg/dL — ABNORMAL HIGH (ref 8.7–10.2)
Chloride: 106 mmol/L (ref 96–106)
Creatinine, Ser: 0.9 mg/dL (ref 0.57–1.00)
Globulin, Total: 2.4 g/dL (ref 1.5–4.5)
Glucose: 92 mg/dL (ref 70–99)
Potassium: 4.6 mmol/L (ref 3.5–5.2)
Sodium: 142 mmol/L (ref 134–144)
Total Protein: 6.8 g/dL (ref 6.0–8.5)
eGFR: 75 mL/min/{1.73_m2} (ref >59)

## 2022-07-28 LAB — VITAMIN D 25 HYDROXY (VIT D DEFICIENCY, FRACTURES): Vit D, 25-Hydroxy: 53.2 ng/mL (ref 30.0–100.0)

## 2022-07-28 LAB — CBC WITH DIFFERENTIAL/PLATELET
Basophils Absolute: 0 10*3/uL (ref 0.0–0.2)
Basos: 1 %
EOS (ABSOLUTE): 0 10*3/uL (ref 0.0–0.4)
Eos: 1 %
Hematocrit: 43.4 % (ref 34.0–46.6)
Hemoglobin: 14.2 g/dL (ref 11.1–15.9)
Immature Grans (Abs): 0 10*3/uL (ref 0.0–0.1)
Immature Granulocytes: 0 %
Lymphocytes Absolute: 1.9 10*3/uL (ref 0.7–3.1)
Lymphs: 28 %
MCH: 29.2 pg (ref 26.6–33.0)
MCHC: 32.7 g/dL (ref 31.5–35.7)
MCV: 89 fL (ref 79–97)
Monocytes Absolute: 0.6 10*3/uL (ref 0.1–0.9)
Monocytes: 9 %
Neutrophils Absolute: 4.1 10*3/uL (ref 1.4–7.0)
Neutrophils: 61 %
Platelets: 261 10*3/uL (ref 150–450)
RBC: 4.87 x10E6/uL (ref 3.77–5.28)
RDW: 12.7 % (ref 11.7–15.4)
WBC: 6.7 10*3/uL (ref 3.4–10.8)

## 2022-07-28 LAB — THYROID PANEL WITH TSH
Free Thyroxine Index: 1.6 (ref 1.2–4.9)
T3 Uptake Ratio: 27 % (ref 24–39)
T4, Total: 6 ug/dL (ref 4.5–12.0)
TSH: 2.16 u[IU]/mL (ref 0.450–4.500)

## 2022-07-28 MED ORDER — ATORVASTATIN CALCIUM 40 MG PO TABS: 40.0000 mg | ORAL_TABLET | Freq: Every day | ORAL | 3 refills | Status: DC

## 2022-07-28 NOTE — Addendum Note (Signed)
Addended by: Baruch Gouty on: 07/28/2022 02:32 PM   Modules accepted: Orders

## 2022-08-31 ENCOUNTER — Ambulatory Visit (INDEPENDENT_AMBULATORY_CARE_PROVIDER_SITE_OTHER): Payer: Medicare HMO | Admitting: Family Medicine

## 2022-08-31 ENCOUNTER — Encounter: Payer: Self-pay | Admitting: Family Medicine

## 2022-08-31 VITALS — BP 116/87 | HR 100 | Temp 98.0°F | Ht 64.0 in | Wt 192.8 lb

## 2022-08-31 DIAGNOSIS — E559 Vitamin D deficiency, unspecified: Secondary | ICD-10-CM

## 2022-08-31 DIAGNOSIS — Z6833 Body mass index (BMI) 33.0-33.9, adult: Secondary | ICD-10-CM

## 2022-08-31 DIAGNOSIS — R6889 Other general symptoms and signs: Secondary | ICD-10-CM | POA: Diagnosis not present

## 2022-08-31 DIAGNOSIS — E782 Mixed hyperlipidemia: Secondary | ICD-10-CM

## 2022-08-31 DIAGNOSIS — Z136 Encounter for screening for cardiovascular disorders: Secondary | ICD-10-CM | POA: Diagnosis not present

## 2022-08-31 NOTE — Progress Notes (Signed)
Subjective:  Patient ID: Cassandra Mcconnell, female    DOB: [birth year], 57 y.o.   MRN: [id number]  Patient Care Team: [name], FNP as PCP - General (Family Medicine) Glenna Fellows, MD as Consulting Physician (Plastic Surgery)   Chief Complaint:  BMI (6 week follow up ) and Labs Only (Calcium re check)   HPI: Cassandra Mcconnell is a 57 y.o. female presenting on 08/31/2022 for BMI (6 week follow up ) and Labs Only (Calcium re check)   1. Mixed hyperlipidemia Compliant with medications - Yes Current medications - lipitor Side effects from medications - no Diet - has changed since last visit, eating healthier Exercise - not daily  2. Vitamin D deficiency Pt is taking oral repletion therapy. Denies bone pain and tenderness, muscle weakness, fracture, and difficulty walking. Lab Results  Component Value Date   VD25OH 53.2 07/27/2022   VD25OH 46.6 02/23/2022   VD25OH 34.23 08/22/2016   Lab Results  Component Value Date   CALCIUM 10.3 (H) 07/27/2022     3. Morbid obesity (HCC) Has been taking the compounded semaglutide and is tolerating well. No adverse side effects. 200 lb at last visit, 192 lb today. Has been doing great with diet and water intake.   4. Serum calcium elevated Elevated at 10.3 with last labs. Asymptomatic.      Relevant past medical, surgical, family, and social history reviewed and updated as indicated.  Allergies and medications reviewed and updated. Data reviewed: Chart in Epic.   Past Medical History:  Diagnosis Date   Ankle fracture    left   Anxiety    Arthritis    Back pain    Bell's palsy    Left side, 2009, 2017   Bell's palsy    Depression    Diverticulitis    Dyspnea    GERD (gastroesophageal reflux disease)    Headache    History of hiatal hernia    Hyperlipidemia    Hypertension    Postoperative nausea 04/21/2016   Toe fracture    left great toe   Wears glasses     Past Surgical History:  Procedure Laterality Date    BACK SURGERY  2004   l4/5 fusion   BREAST REDUCTION SURGERY Bilateral 08/08/2014   Procedure: BILATERAL BREAST REDUCTION  (BREAST);  Surgeon: Glenna Fellows, MD;  Location: [facility];  Service: Plastics;  Laterality: Bilateral;   CATARACT EXTRACTION Bilateral    CERVICAL DISCECTOMY  12/12/2018   3 level    CHOLECYSTECTOMY     1998   INJECTION KNEE Left    KNEE ARTHROSCOPY     LAPAROSCOPIC BILATERAL SALPINGO OOPHERECTOMY Bilateral 03/11/2015   Procedure: LAPAROSCOPIC BILATERAL SALPINGO OOPHORECTOMY;  Surgeon: Lazaro Arms, MD;  Location: [facility];  Service: Gynecology;  Laterality: Bilateral;   LAPAROSCOPIC GASTRIC SLEEVE RESECTION WITH HIATAL HERNIA REPAIR N/A 03/22/2016   Procedure: LAPAROSCOPIC GASTRIC SLEEVE RESECTION WITH HIATAL HERNIA REPAIR WITHUPPER ENDOSCOPY;  Surgeon: Ovidio Kin, MD;  Location: [facility];  Service: General;  Laterality: N/A;   REDUCTION MAMMAPLASTY Bilateral    SEPTOPLASTY  1988   tonsillectomy  1991   TONSILLECTOMY      Social History   Socioeconomic History   Marital status: Married    Spouse name: Not on file   Number of children: 1   Years of education: Not on file   Highest education level: 12th grade  Occupational History   Occupation: stay at home mom  Tobacco  Use   Smoking status: Never   Smokeless tobacco: Never  Vaping Use   Vaping Use: Never used  Substance and Sexual Activity   Alcohol use: No   Drug use: No   Sexual activity: Not Currently    Partners: Male    Birth control/protection: Post-menopausal, Surgical    Comment: BSO 2016  Other Topics Concern   Not on file  Social History Narrative   Lives at home with her husband   Left handed   Caffeine: 2-4 cups of coffee daily   Social Determinants of Health   Financial Resource Strain: Low Risk  (07/27/2022)   Overall Financial Resource Strain (CARDIA)    Difficulty of Paying Living Expenses: Not hard at all  Food Insecurity: No Food Insecurity (07/27/2022)    Hunger Vital Sign    Worried About Running Out of Food in the Last Year: Never true    Ran Out of Food in the Last Year: Never true  Transportation Needs: No Transportation Needs (07/27/2022)   PRAPARE - Administrator, Civil Service (Medical): No    Lack of Transportation (Non-Medical): No  Physical Activity: Inactive (07/27/2022)   Exercise Vital Sign    Days of Exercise per Week: 0 days    Minutes of Exercise per Session: 30 min  Stress: Stress Concern Present (07/27/2022)   [employer] of Occupational Health - Occupational Stress Questionnaire    Feeling of Stress : Rather much  Social Connections: Moderately Integrated (07/27/2022)   Social Connection and Isolation Panel [NHANES]    Frequency of Communication with Friends and Family: Once a week    Frequency of Social Gatherings with Friends and Family: Never    Attends Religious Services: More than 4 times per year    Active Member of [employer] or Organizations: Yes    Attends Banker Meetings: 1 to 4 times per year    Marital Status: Married  Catering manager Violence: Not At Risk (05/13/2022)   Humiliation, Afraid, Rape, and Kick questionnaire    Fear of Current or Ex-Partner: No    Emotionally Abused: No    Physically Abused: No    Sexually Abused: No    Outpatient Encounter Medications as of 08/31/2022  Medication Sig   acetaminophen (TYLENOL) 500 MG tablet Take 1,000 mg by mouth every 6 (six) hours as needed. For pain    atorvastatin (LIPITOR) 40 MG tablet Take 1 tablet (40 mg total) by mouth daily.   Biotin 1 MG CAPS Take by mouth.   Calcium Carb-Cholecalciferol (CALCIUM HIGH POTENCY/VITAMIN D) 600-5 MG-MCG TABS Take by mouth.   citalopram (CELEXA) 20 MG tablet Take 1 tablet (20 mg total) by mouth 2 (two) times a day. (Patient taking differently: Take 20 mg by mouth daily.)   cyanocobalamin (VITAMIN B12) 1000 MCG tablet Take by mouth.   desvenlafaxine (PRISTIQ) 50 MG 24 hr tablet Take 100 mg by mouth  at bedtime.   ibuprofen (ADVIL) 200 MG tablet Take 800-1,200 mg by mouth every 8 (eight) hours as needed.   lisinopril (ZESTRIL) 20 MG tablet Take 1 tablet (20 mg total) by mouth daily.   Multiple Vitamin (MULTIVITAMIN) capsule Take by mouth.   Olopatadine HCl 0.2 % SOLN Place 1 drop into both eyes daily as needed (allergies).   promethazine (PHENERGAN) 25 MG tablet Take 1 tablet (25 mg total) by mouth every 8 (eight) hours as needed for nausea or vomiting.   Propylene Glycol (SYSTANE BALANCE) 0.6 % SOLN Apply 1  drop to eye daily as needed (dry eyes).   SEMAGLUTIDE-WEIGHT MANAGEMENT  Inject 2 mg into the skin once a week. Compounded pharmacy   tiZANidine (ZANAFLEX) 4 MG tablet Take 1 tablet (4 mg total) by mouth every 8 (eight) hours as needed for muscle spasms.   No facility-administered encounter medications on file as of 08/31/2022.    Allergies  Allergen Reactions   Vistaril [Hydroxyzine] Shortness Of Breath    Dizzy, light headed    Zofran [Ondansetron Hcl] Shortness Of Breath and Rash    Numbness   Azithromycin Swelling   Conjugated Estrogens Other (See Comments)    emotional   Cyclobenzaprine Hcl     REACTION: disorientation   Hydrocodone     Other reaction(s): Unknown   Misc. Sulfonamide Containing Compounds Other (See Comments)   Norco [Hydrocodone-Acetaminophen] Other (See Comments)    Felt like she was going through withdrawal   Nsaids Other (See Comments)    Cannot have because of bariatric surgery   Oxycontin [Oxycodone]     Withdrawal feeling    Percocet [Oxycodone-Acetaminophen]     Hypotension    Robaxin [Methocarbamol]     Pt does not remember reaction    Rosuvastatin Dermatitis   Sulfa Antibiotics Hives and Itching   Wellbutrin [Bupropion Hcl] Other (See Comments)    Emotional, makes depression worse   Ciprofloxacin Itching and Rash   Dicyclomine Hcl Itching and Rash    Review of Systems  Constitutional:  Negative for activity change, appetite change,  chills, diaphoresis, fatigue, fever and unexpected weight change.  HENT: Negative.    Eyes: Negative.  Negative for photophobia and visual disturbance.  Respiratory:  Negative for cough, chest tightness and shortness of breath.   Cardiovascular:  Negative for chest pain, palpitations and leg swelling.  Gastrointestinal:  Negative for abdominal pain, blood in stool, constipation, diarrhea, nausea and vomiting.  Endocrine: Negative.  Negative for polydipsia, polyphagia and polyuria.  Genitourinary:  Negative for decreased urine volume, difficulty urinating, dysuria, frequency and urgency.  Musculoskeletal:  Positive for arthralgias, back pain and myalgias.  Skin: Negative.   Allergic/Immunologic: Negative.   Neurological:  Negative for dizziness, tremors, seizures, syncope, facial asymmetry, speech difficulty, weakness, light-headedness, numbness and headaches.  Hematological: Negative.   Psychiatric/Behavioral:  Negative for confusion, hallucinations, sleep disturbance and suicidal ideas.   All other systems reviewed and are negative.       Objective:  BP 116/87   Pulse 100   Temp 98 F (36.7 C) (Temporal)   Ht 5\' 4"  (1.626 m)   Wt 192 lb 12.8 oz (87.5 kg)   LMP 02/10/2015 Comment: had BSO 2016  SpO2 97%   BMI 33.09 kg/m    Wt Readings from Last 3 Encounters:  08/31/22 192 lb 12.8 oz (87.5 kg)  07/27/22 200 lb (90.7 kg)  07/15/22 202 lb (91.6 kg)    Physical Exam Vitals and nursing note reviewed.  Constitutional:      General: She is not in acute distress.    Appearance: Normal appearance. She is well-developed and well-groomed. She is obese. She is not ill-appearing, toxic-appearing or diaphoretic.  HENT:     Head: Normocephalic and atraumatic.     Jaw: There is normal jaw occlusion.     Right Ear: Hearing normal.     Left Ear: Hearing normal.     Nose: Nose normal.     Mouth/Throat:     Lips: Pink.     Mouth: Mucous membranes are moist.  Pharynx: Oropharynx is  clear. Uvula midline.  Eyes:     General: Lids are normal.     Extraocular Movements: Extraocular movements intact.     Conjunctiva/sclera: Conjunctivae normal.     Pupils: Pupils are equal, round, and reactive to light.  Neck:     Thyroid: No thyroid mass, thyromegaly or thyroid tenderness.     Vascular: No carotid bruit or JVD.     Trachea: Trachea and phonation normal.  Cardiovascular:     Rate and Rhythm: Normal rate and regular rhythm.     Chest Wall: PMI is not displaced.     Pulses: Normal pulses.     Heart sounds: Normal heart sounds. No murmur heard.    No friction rub. No gallop.  Pulmonary:     Effort: Pulmonary effort is normal. No respiratory distress.     Breath sounds: Normal breath sounds. No wheezing.  Abdominal:     General: Bowel sounds are normal. There is no distension or abdominal bruit.     Palpations: Abdomen is soft. There is no hepatomegaly or splenomegaly.     Tenderness: There is no abdominal tenderness. There is no right CVA tenderness or left CVA tenderness.     Hernia: No hernia is present.  Musculoskeletal:        General: Normal range of motion.     Cervical back: Normal range of motion and neck supple.     Right lower leg: No edema.     Left lower leg: No edema.  Lymphadenopathy:     Cervical: No cervical adenopathy.  Skin:    General: Skin is warm and dry.     Capillary Refill: Capillary refill takes less than 2 seconds.     Coloration: Skin is not cyanotic, jaundiced or pale.     Findings: No rash.  Neurological:     General: No focal deficit present.     Mental Status: She is alert and oriented to person, place, and time.     Cranial Nerves: Facial asymmetry (chronic) present.     Sensory: Sensation is intact.     Motor: Tremor (resting, both hands, minimal) present.     Coordination: Coordination is intact.     Gait: Gait is intact.     Deep Tendon Reflexes: Reflexes are normal and symmetric.  Psychiatric:        Attention and  Perception: Attention and perception normal.        Mood and Affect: Mood and affect normal.        Speech: Speech normal.        Behavior: Behavior normal. Behavior is cooperative.        Thought Content: Thought content normal.        Cognition and Memory: Cognition and memory normal.        Judgment: Judgment normal.     Results for orders placed or performed in visit on 07/27/22  CMP14+EGFR  Result Value Ref Range   Glucose 92 70 - 99 mg/dL   BUN 11 6 - 24 mg/dL   Creatinine, Ser 1.61 0.57 - 1.00 mg/dL   eGFR 75 >09 UE/AVW/0.98   BUN/Creatinine Ratio 12 9 - 23   Sodium 142 134 - 144 mmol/L   Potassium 4.6 3.5 - 5.2 mmol/L   Chloride 106 96 - 106 mmol/L   CO2 21 20 - 29 mmol/L   Calcium 10.3 (H) 8.7 - 10.2 mg/dL   Total Protein 6.8 6.0 - 8.5 g/dL  Albumin 4.4 3.8 - 4.9 g/dL   Globulin, Total 2.4 1.5 - 4.5 g/dL   Albumin/Globulin Ratio 1.8 1.2 - 2.2   Bilirubin Total 0.5 0.0 - 1.2 mg/dL   Alkaline Phosphatase 102 44 - 121 IU/L   AST 34 0 - 40 IU/L   ALT 23 0 - 32 IU/L  CBC with Differential/Platelet  Result Value Ref Range   WBC 6.7 3.4 - 10.8 x10E3/uL   RBC 4.87 3.77 - 5.28 x10E6/uL   Hemoglobin 14.2 11.1 - 15.9 g/dL   Hematocrit 16.1 09.6 - 46.6 %   MCV 89 79 - 97 fL   MCH 29.2 26.6 - 33.0 pg   MCHC 32.7 31.5 - 35.7 g/dL   RDW 04.5 40.9 - 81.1 %   Platelets 261 150 - 450 x10E3/uL   Neutrophils 61 Not Estab. %   Lymphs 28 Not Estab. %   Monocytes 9 Not Estab. %   Eos 1 Not Estab. %   Basos 1 Not Estab. %   Neutrophils Absolute 4.1 1.4 - 7.0 x10E3/uL   Lymphocytes Absolute 1.9 0.7 - 3.1 x10E3/uL   Monocytes Absolute 0.6 0.1 - 0.9 x10E3/uL   EOS (ABSOLUTE) 0.0 0.0 - 0.4 x10E3/uL   Basophils Absolute 0.0 0.0 - 0.2 x10E3/uL   Immature Granulocytes 0 Not Estab. %   Immature Grans (Abs) 0.0 0.0 - 0.1 x10E3/uL  Lipid panel  Result Value Ref Range   Cholesterol, Total 177 100 - 199 mg/dL   Triglycerides 914 0 - 149 mg/dL   HDL 42 >78 mg/dL   VLDL Cholesterol Cal  22 5 - 40 mg/dL   LDL Chol Calc (NIH) 295 (H) 0 - 99 mg/dL   Chol/HDL Ratio 4.2 0.0 - 4.4 ratio  Thyroid Panel With TSH  Result Value Ref Range   TSH 2.160 0.450 - 4.500 uIU/mL   T4, Total 6.0 4.5 - 12.0 ug/dL   T3 Uptake Ratio 27 24 - 39 %   Free Thyroxine Index 1.6 1.2 - 4.9  VITAMIN D 25 Hydroxy (Vit-D Deficiency, Fractures)  Result Value Ref Range   Vit D, 25-Hydroxy 53.2 30.0 - 100.0 ng/mL       Pertinent labs & imaging results that were available during my care of the patient were reviewed by me and considered in my medical decision making.  Assessment & Plan:  Kameesha was seen today for bmi and labs only.  Diagnoses and all orders for this visit:  Mixed hyperlipidemia Diet encouraged - increase intake of fresh fruits and vegetables, increase intake of lean proteins. Bake, broil, or grill foods. Avoid fried, greasy, and fatty foods. Avoid fast foods. Increase intake of fiber-rich whole grains. Exercise encouraged - at least 150 minutes per week and advance as tolerated.  Goal BMI < 25. Continue medications as prescribed. Follow up in 3-6 months as discussed.  -     Lipid panel  Vitamin D deficiency Labs pending. Continue repletion therapy. If indicated, will change repletion dosage. Eat foods rich in Vit D including milk, orange juice, yogurt with vitamin D added, salmon or mackerel, canned tuna fish, cereals with vitamin D added, and cod liver oil. Get out in the sun but make sure to wear at least SPF 30 sunscreen.  -     VITAMIN D 25 Hydroxy (Vit-D Deficiency, Fractures)  Morbid obesity (HCC) Has been doing well on compounded semaglutide. Will continue. Labs pending. -     BMP8+EGFR -     Lipid panel -  Thyroid Panel With TSH  Serum calcium elevated Will check below today. Further treatment pending results.  -     BMP8+EGFR -     Calcium, ionized -     PTH, Intact and Calcium     Continue all other maintenance medications.  Follow up plan: Return in about 3  months (around 12/01/2022) for BMI.   Continue healthy lifestyle choices, including diet (rich in fruits, vegetables, and lean proteins, and low in salt and simple carbohydrates) and exercise (at least 30 minutes of moderate physical activity daily).  Educational handout given for calorie counting for weight loss  The above assessment and management plan was discussed with the patient. The patient verbalized understanding of and has agreed to the management plan. Patient is aware to call the clinic if they develop any new symptoms or if symptoms persist or worsen. Patient is aware when to return to the clinic for a follow-up visit. Patient educated on when it is appropriate to go to the emergency department.   Cassandra Baars, FNP-C [facility] [phone]

## 2022-08-31 NOTE — Addendum Note (Signed)
Addended by: [name] on: 08/31/2022 08:23 PM   Modules accepted: Level of Service

## 2022-09-01 ENCOUNTER — Encounter: Payer: Self-pay | Admitting: Family Medicine

## 2022-09-01 LAB — PTH, INTACT AND CALCIUM: PTH: 35 pg/mL (ref 15–65)

## 2022-09-01 LAB — VITAMIN D 25 HYDROXY (VIT D DEFICIENCY, FRACTURES): Vit D, 25-Hydroxy: 57.8 ng/mL (ref 30.0–100.0)

## 2022-09-01 LAB — LIPID PANEL
Chol/HDL Ratio: 3.3 ratio (ref 0.0–4.4)
Cholesterol, Total: 144 mg/dL (ref 100–199)
HDL: 44 mg/dL (ref >39)
LDL Chol Calc (NIH): 83 mg/dL (ref 0–99)
Triglycerides: 89 mg/dL (ref 0–149)
VLDL Cholesterol Cal: 17 mg/dL (ref 5–40)

## 2022-09-01 LAB — BMP8+EGFR
BUN/Creatinine Ratio: 15 (ref 9–23)
BUN: 14 mg/dL (ref 6–24)
CO2: 20 mmol/L (ref 20–29)
Calcium: 10.3 mg/dL — ABNORMAL HIGH (ref 8.7–10.2)
Chloride: 106 mmol/L (ref 96–106)
Creatinine, Ser: 0.93 mg/dL (ref 0.57–1.00)
Glucose: 87 mg/dL (ref 70–99)
Potassium: 4.5 mmol/L (ref 3.5–5.2)
Sodium: 143 mmol/L (ref 134–144)
eGFR: 71 mL/min/{1.73_m2} (ref >59)

## 2022-09-01 LAB — THYROID PANEL WITH TSH
Free Thyroxine Index: 1.8 (ref 1.2–4.9)
T3 Uptake Ratio: 27 % (ref 24–39)
T4, Total: 6.8 ug/dL (ref 4.5–12.0)
TSH: 1.16 u[IU]/mL (ref 0.450–4.500)

## 2022-09-01 LAB — CALCIUM, IONIZED: Calcium, Ion: 5.4 mg/dL (ref 4.5–5.6)

## 2022-10-07 ENCOUNTER — Encounter: Payer: Self-pay | Admitting: Family Medicine

## 2022-10-17 ENCOUNTER — Encounter: Payer: Self-pay | Admitting: *Deleted

## 2022-10-19 ENCOUNTER — Ambulatory Visit: Payer: Medicare HMO | Admitting: Neurology

## 2022-10-19 ENCOUNTER — Encounter: Payer: Self-pay | Admitting: Family Medicine

## 2022-10-20 ENCOUNTER — Encounter: Payer: Self-pay | Admitting: Neurology

## 2022-10-20 ENCOUNTER — Ambulatory Visit: Payer: Medicare HMO | Admitting: Neurology

## 2022-10-20 ENCOUNTER — Ambulatory Visit: Payer: Medicare HMO

## 2022-10-20 NOTE — Progress Notes (Deleted)
[facility]    Provider:  [name] Referring Provider: [name], FNP Primary Care Physician:  [name], FNP  CC:  Tremors ( Past Left-sided facial droop dxed with Bell's Palsy with synkinesis)  HPI: We have not seen patient in several years, we are seeing her today for new chief complaint of tremor. PMHx has HYPERCHOLESTEROLEMIA, PURE; Morbid obesity (HCC); GAD (generalized anxiety disorder); Primary hypertension; GERD; CHOLECYSTECTOMY, HX OF; Depression, recurrent (HCC); Allergic rhinitis; Osteoarthritis of multiple joints; Hx of bilateral oophorectomy; Cervical radiculopathy; Neurogenic claudication; Spinal stenosis of lumbar region; Mood disorder (HCC); Unilateral facial paresis; Mixed hyperlipidemia; and Vitamin D deficiency on their problem list.  I reviewed Cassandra Mcconnell' notes: Patient had bilateral hand tremors for 2 years which have worsened.  It is starting to affect her daily activities such as applying make-up.  Notes noticed a tremor in the bilateral hands which was resting and chronic facial asymmetry due to Bell's palsy.  Reviewed notes, labs and imaging from outside physicians, which showed: Recent Results (from the past 2160 hour(s))  CMP14+EGFR     Status: Abnormal   Collection Time: 07/27/22 10:10 AM  Result Value Ref Range   Glucose 92 70 - 99 mg/dL   BUN 11 6 - 24 mg/dL   Creatinine, Ser 3.08 0.57 - 1.00 mg/dL   eGFR 75 >65 HQ/ION/6.29   BUN/Creatinine Ratio 12 9 - 23   Sodium 142 134 - 144 mmol/L   Potassium 4.6 3.5 - 5.2 mmol/L   Chloride 106 96 - 106 mmol/L   CO2 21 20 - 29 mmol/L   Calcium 10.3 (H) 8.7 - 10.2 mg/dL   Total Protein 6.8 6.0 - 8.5 g/dL   Albumin 4.4 3.8 - 4.9 g/dL   Globulin, Total 2.4 1.5 - 4.5 g/dL   Albumin/Globulin Ratio 1.8 1.2 - 2.2   Bilirubin Total 0.5 0.0 - 1.2 mg/dL   Alkaline Phosphatase 102 44 - 121 IU/L   AST 34 0 - 40 IU/L   ALT 23 0 - 32 IU/L  CBC with Differential/Platelet     Status: None    Collection Time: 07/27/22 10:10 AM  Result Value Ref Range   WBC 6.7 3.4 - 10.8 x10E3/uL   RBC 4.87 3.77 - 5.28 x10E6/uL   Hemoglobin 14.2 11.1 - 15.9 g/dL   Hematocrit 52.8 41.3 - 46.6 %   MCV 89 79 - 97 fL   MCH 29.2 26.6 - 33.0 pg   MCHC 32.7 31.5 - 35.7 g/dL   RDW 24.4 01.0 - 27.2 %   Platelets 261 150 - 450 x10E3/uL   Neutrophils 61 Not Estab. %   Lymphs 28 Not Estab. %   Monocytes 9 Not Estab. %   Eos 1 Not Estab. %   Basos 1 Not Estab. %   Neutrophils Absolute 4.1 1.4 - 7.0 x10E3/uL   Lymphocytes Absolute 1.9 0.7 - 3.1 x10E3/uL   Monocytes Absolute 0.6 0.1 - 0.9 x10E3/uL   EOS (ABSOLUTE) 0.0 0.0 - 0.4 x10E3/uL   Basophils Absolute 0.0 0.0 - 0.2 x10E3/uL   Immature Granulocytes 0 Not Estab. %   Immature Grans (Abs) 0.0 0.0 - 0.1 x10E3/uL  Lipid panel     Status: Abnormal   Collection Time: 07/27/22 10:10 AM  Result Value Ref Range   Cholesterol, Total 177 100 - 199 mg/dL   Triglycerides 536 0 - 149 mg/dL   HDL 42 >64 mg/dL   VLDL Cholesterol Cal 22 5 - 40 mg/dL  LDL Chol Calc (NIH) 113 (H) 0 - 99 mg/dL   Chol/HDL Ratio 4.2 0.0 - 4.4 ratio    Comment:                                   T. Chol/HDL Ratio                                             Men  Women                               1/2 Avg.Risk  3.4    3.3                                   Avg.Risk  5.0    4.4                                2X Avg.Risk  9.6    7.1                                3X Avg.Risk 23.4   11.0   Thyroid Panel With TSH     Status: None   Collection Time: 07/27/22 10:10 AM  Result Value Ref Range   TSH 2.160 0.450 - 4.500 uIU/mL   T4, Total 6.0 4.5 - 12.0 ug/dL   T3 Uptake Ratio 27 24 - 39 %   Free Thyroxine Index 1.6 1.2 - 4.9  VITAMIN D 25 Hydroxy (Vit-D Deficiency, Fractures)     Status: None   Collection Time: 07/27/22 10:10 AM  Result Value Ref Range   Vit D, 25-Hydroxy 53.2 30.0 - 100.0 ng/mL    Comment: Vitamin D deficiency has been defined by the Institute of Medicine and an  Endocrine Society practice guideline as a level of serum 25-OH vitamin D less than 20 ng/mL (1,2). The Endocrine Society went on to further define vitamin D insufficiency as a level between 21 and 29 ng/mL (2). 1. IOM (Institute of Medicine). 2010. Dietary reference    intakes for calcium and D. [place]: The    [employer]. 2. Holick MF, Binkley , Bischoff-Ferrari HA, et al.    Evaluation, treatment, and prevention of vitamin D    deficiency: an Endocrine Society clinical practice    guideline. JCEM. 2011 Jul; 96(7):1911-30.   BMP8+EGFR     Status: Abnormal   Collection Time: 08/31/22 11:53 AM  Result Value Ref Range   Glucose 87 70 - 99 mg/dL   BUN 14 6 - 24 mg/dL   Creatinine, Ser 8.11 0.57 - 1.00 mg/dL   eGFR 71 >91 YN/WGN/5.62   BUN/Creatinine Ratio 15 9 - 23   Sodium 143 134 - 144 mmol/L   Potassium 4.5 3.5 - 5.2 mmol/L   Chloride 106 96 - 106 mmol/L   CO2 20 20 - 29 mmol/L   Calcium 10.3 (H) 8.7 - 10.2 mg/dL  Calcium, ionized     Status: None   Collection Time: 08/31/22 11:53 AM  Result Value Ref Range   Calcium, Ion  5.4 4.5 - 5.6 mg/dL  PTH, Intact and Calcium     Status: None   Collection Time: 08/31/22 11:53 AM  Result Value Ref Range   PTH 35 15 - 65 pg/mL   PTH Interp Comment     Comment: Interpretation                 Intact PTH    Calcium                                 (pg/mL)      (mg/dL) Normal                          15 - 65     8.6 - 10.2 Primary Hyperparathyroidism         >65          >10.2 Secondary Hyperparathyroidism       >65          <10.2 Non-Parathyroid Hypercalcemia       <65          >10.2 Hypoparathyroidism                  <15          < 8.6 Non-Parathyroid Hypocalcemia    15 - 65          < 8.6   Lipid panel     Status: None   Collection Time: 08/31/22 11:53 AM  Result Value Ref Range   Cholesterol, Total 144 100 - 199 mg/dL   Triglycerides 89 0 - 149 mg/dL   HDL 44 >82 mg/dL   VLDL Cholesterol Cal 17 5 - 40 mg/dL    LDL Chol Calc (NIH) 83 0 - 99 mg/dL   Chol/HDL Ratio 3.3 0.0 - 4.4 ratio    Comment:                                   T. Chol/HDL Ratio                                             Men  Women                               1/2 Avg.Risk  3.4    3.3                                   Avg.Risk  5.0    4.4                                2X Avg.Risk  9.6    7.1                                3X Avg.Risk 23.4   11.0   Thyroid Panel With TSH     Status: None   Collection Time: 08/31/22 11:53 AM  Result Value Ref Range   TSH 1.160 0.450 -  4.500 uIU/mL   T4, Total 6.8 4.5 - 12.0 ug/dL   T3 Uptake Ratio 27 24 - 39 %   Free Thyroxine Index 1.8 1.2 - 4.9  VITAMIN D 25 Hydroxy (Vit-D Deficiency, Fractures)     Status: None   Collection Time: 08/31/22 11:53 AM  Result Value Ref Range   Vit D, 25-Hydroxy 57.8 30.0 - 100.0 ng/mL    Comment: Vitamin D deficiency has been defined by the Institute of Medicine and an Endocrine Society practice guideline as a level of serum 25-OH vitamin D less than 20 ng/mL (1,2). The Endocrine Society went on to further define vitamin D insufficiency as a level between 21 and 29 ng/mL (2). 1. IOM (Institute of Medicine). 2010. Dietary reference    intakes for calcium and D. [place]: The    [employer]. 2. Holick MF, Binkley [place], Bischoff-Ferrari HA, et al.    Evaluation, treatment, and prevention of vitamin D    deficiency: an Endocrine Society clinical practice    guideline. JCEM. 2011 Jul; 96(7):1911-30.    CT cervical spine 01/2019: EXAM: CT CERVICAL SPINE WITHOUT CONTRAST   TECHNIQUE: Multidetector CT imaging of the cervical spine was performed without intravenous contrast. Multiplanar CT image reconstructions were also generated.   COMPARISON:  Radiograph 01/17/2019   FINDINGS: Alignment: Slight reversal the normal cervical lordosis centered at the C3-4 level is similar to comparison radiograph. Slight straightening across the  fused levels is seen. No traumatic listhesis is identified. Craniocervical and atlantoaxial alignment is maintained.   Skull base and vertebrae: No acute fracture or suspicious osseous lesion. C4-C7 anterior cervical spinal fusion with interbody spacer placement is noted. Hardware appears intact and engaged. Alignment is similar to comparison radiographs. No significant adjacent segment disease is seen.   Soft tissues and spinal canal: No pre or paravertebral fluid or swelling. No visible canal hematoma.   Disc levels: No significant central canal or foraminal stenosis identified within the imaged levels of the spine.   Upper chest: Accessory azygos fissure is noted. Lung apices are clear. Atherosclerotic calcification is seen in the left carotid bifurcation.   Other: None   IMPRESSION: 1. No acute cervical spine fracture or traumatic listhesis. 2. C4-C7 anterior cervical spinal fusion without evidence of hardware complication.    Interval history January 14, 2019: Patient with Bell's palsy (2009 and 2017).  She has been stable for several years.  MRI of the brain August 2018 was normal, no abnormality to explain symptoms, no evidence of cranial nerve or skull base pathology. Patient called with left-sided head and ear pain, she was worried she was going to have another bout of Bell's palsy, we moved up her appointment to today.  In the past labs included in 2017 normal Lyme's disease, ACE inhibitor, Sjogren's, thyroid, CBC, CMP, B12 (B12 last in 2018).  She started having pain in the left back of the head, but in the setting of of recent 3-level acdf in August which may have caused occipital neuralgia. She had significant arthritic changes, she is feeling much better. The head pain is resolved, her primary called her in some prednisone and helped. It was pulsating, no migrainous features, lasted 1.5 days. Resolved. Still has synkinesis.   Interval history 11/27/2017: Patient with  Bell's Palsy(2009 and 2017). She takes gabapentin for nerve pain. She has been stable for several years. She is under significant stress with her son who was hospitalized for psychiatric issues. MRI of the brain was ordered in 03/2017 but  was not completed by patient.  She has spasms on the left side of the face, no pain, Likely synkinesis. She has hand numbness recommended wrist splints and can consider emg/ncs in the future.   HPI:  Cassandra Mcconnell is a 57 y.o. female here as a referral from Dr. Reginia Forts for Bell's palsy. Past medical history of anxiety, hyperlipidemia, hypertension, depression, Bell's palsy. She started getting a headache last Monday and Tuesday she noticed weakness, she woke up Wednesday with weakness and ear pain. No inciting events, no trauma, no rash or other. She is having associated pain in the left ear. Things sound louder. It is giving her a headache. tylenol and ibuprofen not helping. She has a history of remote bell's palsy that cleared up in 2 weeks. Patient went to the ED and she was started on prednisone and valtrex. She is using drops in the eyes to lubricate, she is aware of the risk of drying out and corneal damage, wearing glasses all the time to protect her eyes given decrease in blink rate (discussed all these again and the need for vigilance). She can shut her eye and so her eye is closed all night, no dry eye in the morning. No inciting event or head trauma or illness. Her eye is blurry on the left. She has been stressed. She has left facial droop, difficulty closing the left eye with decreased blink, blurry left eye vision. No other focal neurologic symptoms or complaints.  Reviewed notes, labs and imaging from outside physicians, which showed:  CT of the head in 2009 showed No acute intracranial abnormalities including mass lesion or mass effect, hydrocephalus, extra-axial fluid collection, midline shift, hemorrhage, or acute infarction, large ischemic events (personally  reviewed images)  Reviewed records. Patient presented to the emergency room on 09/22/2015. She complained of constant, gradually worsening left-sided facial droop upon awakening that morning. Associated symptoms were headache, fatigue and left ear pain. The pain in the left ear began followed by draping. She has a history of Bell's palsy with right-sided facial droop in the past. Denied any other complaints. Exam was significant for facial droop on the left, and ability to wrinkle forehead on the left, intact sensation. She was diagnosed with Bell's palsy. Supportive care and treatment was recommended.  Review of Systems: Patient complains of symptoms per HPI as well as the following symptoms: Blurred vision, headache, numbness, weakness, slurred speech, anxiety. Pertinent negatives per HPI. All others negative.   Social History   Socioeconomic History   Marital status: Married    Spouse name: Not on file   Number of children: 1   Years of education: Not on file   Highest education level: 12th grade  Occupational History   Occupation: stay at home mom  Tobacco Use   Smoking status: Never   Smokeless tobacco: Never  Vaping Use   Vaping Use: Never used  Substance and Sexual Activity   Alcohol use: No   Drug use: No   Sexual activity: Not Currently    Partners: Male    Birth control/protection: Post-menopausal, Surgical    Comment: BSO 2016  Other Topics Concern   Not on file  Social History Narrative   Lives at home with her husband   Left handed   Caffeine: 2-4 cups of coffee daily   Social Determinants of Health   Financial Resource Strain: Low Risk  (07/27/2022)   Overall Financial Resource Strain (CARDIA)    Difficulty of Paying Living Expenses: Not hard at  all  Food Insecurity: No Food Insecurity (07/27/2022)   Hunger Vital Sign    Worried About Running Out of Food in the Last Year: Never true    Ran Out of Food in the Last Year: Never true  Transportation Needs: No  Transportation Needs (07/27/2022)   PRAPARE - Administrator, Civil Service (Medical): No    Lack of Transportation (Non-Medical): No  Physical Activity: Inactive (07/27/2022)   Exercise Vital Sign    Days of Exercise per Week: 0 days    Minutes of Exercise per Session: 30 min  Stress: Stress Concern Present (07/27/2022)   [employer] of Occupational Health - Occupational Stress Questionnaire    Feeling of Stress : Rather much  Social Connections: Moderately Integrated (07/27/2022)   Social Connection and Isolation Panel [NHANES]    Frequency of Communication with Friends and Family: Once a week    Frequency of Social Gatherings with Friends and Family: Never    Attends Religious Services: More than 4 times per year    Active Member of [employer] or Organizations: Yes    Attends Banker Meetings: 1 to 4 times per year    Marital Status: Married  Catering manager Violence: Not At Risk (05/13/2022)   Humiliation, Afraid, Rape, and Kick questionnaire    Fear of Current or Ex-Partner: No    Emotionally Abused: No    Physically Abused: No    Sexually Abused: No    Family History  Problem Relation Age of Onset   COPD Mother    Hypertension Mother    GER disease Mother    Heart attack Father    Prostate cancer Father    Hyperlipidemia Brother    Hypertension Brother    Diabetes Brother    Neuropathy Brother    Hyperlipidemia Brother    Hypertension Brother    Cancer Paternal Uncle        prostate and bone   Cancer Paternal Uncle        prostate and bone   Heart attack Paternal Grandmother    Heart attack Paternal Grandfather    Stroke Paternal Grandfather    Colon cancer Neg Hx    Stomach cancer Neg Hx     Past Medical History:  Diagnosis Date   Ankle fracture    left   Anxiety    Arthritis    Back pain    Bell's palsy    Left side, 2009, 2017   Bell's palsy    Depression    Diverticulitis    Dyspnea    GERD (gastroesophageal reflux disease)     Headache    History of hiatal hernia    Hyperlipidemia    Hypertension    Postoperative nausea 04/21/2016   Toe fracture    left great toe   Wears glasses     Past Surgical History:  Procedure Laterality Date   BACK SURGERY  2004   l4/5 fusion   BREAST REDUCTION SURGERY Bilateral 08/08/2014   Procedure: BILATERAL BREAST REDUCTION  (BREAST);  Surgeon: Glenna Fellows, MD;  Location: [facility];  Service: Plastics;  Laterality: Bilateral;   CATARACT EXTRACTION Bilateral    CERVICAL DISCECTOMY  12/12/2018   3 level    CHOLECYSTECTOMY     1998   INJECTION KNEE Left    KNEE ARTHROSCOPY     LAPAROSCOPIC BILATERAL SALPINGO OOPHERECTOMY Bilateral 03/11/2015   Procedure: LAPAROSCOPIC BILATERAL SALPINGO OOPHORECTOMY;  Surgeon: Lazaro Arms, MD;  Location:  [facility];  Service: Gynecology;  Laterality: Bilateral;   LAPAROSCOPIC GASTRIC SLEEVE RESECTION WITH HIATAL HERNIA REPAIR N/A 03/22/2016   Procedure: LAPAROSCOPIC GASTRIC SLEEVE RESECTION WITH HIATAL HERNIA REPAIR WITHUPPER ENDOSCOPY;  Surgeon: Ovidio Kin, MD;  Location: [facility];  Service: General;  Laterality: N/A;   REDUCTION MAMMAPLASTY Bilateral    SEPTOPLASTY  1988   tonsillectomy  1991   TONSILLECTOMY      Current Outpatient Medications  Medication Sig Dispense Refill   acetaminophen (TYLENOL) 500 MG tablet Take 1,000 mg by mouth every 6 (six) hours as needed. For pain      atorvastatin (LIPITOR) 40 MG tablet Take 1 tablet (40 mg total) by mouth daily. 90 tablet 3   Biotin 1 MG CAPS Take by mouth.     Calcium Carb-Cholecalciferol (CALCIUM HIGH POTENCY/VITAMIN D) 600-5 MG-MCG TABS Take by mouth.     citalopram (CELEXA) 20 MG tablet Take 1 tablet (20 mg total) by mouth 2 (two) times a day. (Patient taking differently: Take 20 mg by mouth daily.) 180 tablet 3   cyanocobalamin (VITAMIN B12) 1000 MCG tablet Take by mouth.     desvenlafaxine (PRISTIQ) 50 MG 24 hr tablet Take 100 mg by mouth at bedtime.      ibuprofen (ADVIL) 200 MG tablet Take 800-1,200 mg by mouth every 8 (eight) hours as needed.     lisinopril (ZESTRIL) 20 MG tablet Take 1 tablet (20 mg total) by mouth daily. 90 tablet 1   Multiple Vitamin (MULTIVITAMIN) capsule Take by mouth.     Olopatadine HCl 0.2 % SOLN Place 1 drop into both eyes daily as needed (allergies).     promethazine (PHENERGAN) 25 MG tablet Take 1 tablet (25 mg total) by mouth every 8 (eight) hours as needed for nausea or vomiting. 20 tablet 0   Propylene Glycol (SYSTANE BALANCE) 0.6 % SOLN Apply 1 drop to eye daily as needed (dry eyes).     SEMAGLUTIDE-WEIGHT MANAGEMENT [place] Inject 2 mg into the skin once a week. Compounded pharmacy     tiZANidine (ZANAFLEX) 4 MG tablet Take 1 tablet (4 mg total) by mouth every 8 (eight) hours as needed for muscle spasms. 21 tablet 0   No current facility-administered medications for this visit.    Allergies as of 10/20/2022 - Review Complete 08/31/2022  Allergen Reaction Noted   Vistaril [hydroxyzine] Shortness Of Breath 08/31/2022   Zofran [ondansetron hcl] Shortness Of Breath and Rash 10/15/2014   Azithromycin Swelling 12/29/2011   Conjugated estrogens Other (See Comments)    Cyclobenzaprine hcl  03/12/2010   Hydrocodone  05/12/2021   Misc. sulfonamide containing compounds Other (See Comments) 02/23/2022   Norco [hydrocodone-acetaminophen] Other (See Comments) 10/12/2014   Nsaids Other (See Comments) 04/21/2016   Oxycontin [oxycodone]  01/14/2019   Percocet [oxycodone-acetaminophen]  10/15/2014   Robaxin [methocarbamol]  01/14/2019   Rosuvastatin Dermatitis    Sulfa antibiotics Hives and Itching 02/15/2011   Wellbutrin [bupropion hcl] Other (See Comments) 02/15/2011   Ciprofloxacin Itching and Rash    Dicyclomine hcl Itching and Rash     Vitals: LMP 02/10/2015 Comment: had BSO 2016 Last Weight:  Wt Readings from Last 1 Encounters:  08/31/22 192 lb 12.8 oz (87.5 kg)   Last Height:   Ht Readings from Last 1  Encounters:  08/31/22 5\' 4"  (1.626 m)    Physical exam: Exam: Gen: NAD, conversant, well nourised, obese, well groomed  CV: RRR, no MRG. No Carotid Bruits. No peripheral edema, warm, nontender Eyes: Conjunctivae clear without exudates or hemorrhage  Neuro: Detailed Neurologic Exam  Speech:    Speech is normal; fluent and spontaneous with normal comprehension.  Cognition:    The patient is oriented to person, place, and time;     recent and remote memory intact;     language fluent;     normal attention, concentration,     fund of knowledge Cranial Nerves:    The pupils are equal, round, and reactive to light. The fundi are normal and spontaneous venous pulsations are present. Visual fields are full to finger confrontation. Extraocular movements are intact. Trigeminal sensation is intact and the muscles of mastication are normal. Left lower facial droop, incomplete closure of left eye with weakness and droop, weakness of left forehead on eyebrow elevation, Bell's phenomena. Facial sensation impaired per patient. The palate elevates in the midline. Hearing intact. Voice is normal. Shoulder shrug is normal. The tongue has normal motion without fasciculations.   Coordination:    Normal finger to nose and heel to shin. Normal rapid alternating movements.   Gait:    Slightly wide based, large body habitus  Motor Observation:    No asymmetry, no atrophy, and no involuntary movements noted. Tone:    Normal muscle tone.    Posture:    Posture is normal. normal erect    Strength:    Strength is V/V in the upper and lower limbs.      Sensation: intact to LT     Reflex Exam:  DTR's:    Deep tendon reflexes in the upper and lower extremities are normal bilaterally.   Toes:    The toes are downgoing bilaterally.   Clonus:    Clonus is absent.      Assessment/Plan:  Patient with Bell's palsy (2009 and 2017).  She has been stable for several years.  MRI of  the brain August 2018 was normal, no abnormality to explain symptoms, no evidence of cranial nerve or skull base pathology. Patient called with left-sided head and ear pain, she was worried she was going to have another bout of Bell's palsy, we moved up her appointment to today.  In the past labs included in 2017 normal Lyme's disease, ACE inhibitor, Sjogren's, thyroid, CBC, CMP, B12 (B12 last in 2018). Did not completely recover, now with synkinesis of the left face.  - had occipital nerve irritation and left occipital pain in the setting of acdf. Likely occipital nerve irritation, resolved with steroids. Will give a medrol dosepak to have on hand in case it happens again. Doesn't like needles so no nerve block. No bell's palsy, she was worried.   - Synkinesis: Synkinesis occurs secondary to abnormal facial nerve regeneration after Bell's palsy, or in instances where the facial nerve has been cut and sewn back together. The facial nerve fibers can implant into the different muscles in cases of Bell's palsy.  - She was referred to a facial surgeon in the past to see if any surgical options for left bells palsy for nerve transplantation or decompression   Naomie Dean, MD  [facility] [address] [place], [place] [address]  Phone [phone] Fax [phone]  A total of 25 minutes was spent face-to-face with this patient. Over half this time was spent on counseling patient on the  No diagnosis found.   diagnosis and different diagnostic and therapeutic options, counseling and coordination of care, risks ans  benefits of management, compliance, or risk factor reduction and education.

## 2022-10-24 DIAGNOSIS — F6381 Intermittent explosive disorder: Secondary | ICD-10-CM | POA: Diagnosis not present

## 2022-10-24 DIAGNOSIS — F331 Major depressive disorder, recurrent, moderate: Secondary | ICD-10-CM | POA: Diagnosis not present

## 2022-10-24 DIAGNOSIS — F411 Generalized anxiety disorder: Secondary | ICD-10-CM | POA: Diagnosis not present

## 2022-10-26 ENCOUNTER — Other Ambulatory Visit: Payer: Self-pay | Admitting: Nurse Practitioner

## 2022-10-26 DIAGNOSIS — I10 Essential (primary) hypertension: Secondary | ICD-10-CM

## 2022-11-08 ENCOUNTER — Other Ambulatory Visit (INDEPENDENT_AMBULATORY_CARE_PROVIDER_SITE_OTHER): Payer: Medicare HMO

## 2022-11-08 ENCOUNTER — Encounter: Payer: Self-pay | Admitting: Physician Assistant

## 2022-11-08 ENCOUNTER — Ambulatory Visit: Payer: Medicare HMO | Admitting: Physician Assistant

## 2022-11-08 DIAGNOSIS — M79644 Pain in right finger(s): Secondary | ICD-10-CM | POA: Diagnosis not present

## 2022-11-08 DIAGNOSIS — G8929 Other chronic pain: Secondary | ICD-10-CM | POA: Diagnosis not present

## 2022-11-08 NOTE — Progress Notes (Signed)
Office Visit Note   Patient: Cassandra Mcconnell           Date of Birth: 02-03-1966           MRN: [id number] Visit Date: 11/08/2022              Requested by: [name], FNP [address] [place],  [place] [address] PCP: [name], FNP   Assessment & Plan: Visit Diagnoses:  1. Chronic pain of right thumb     Plan: Impression is right thumb ulnar collateral ligament sprain.  We have discussed symptomatic treatment to include referral to occupational therapy for thermoplastic splint.  Patient would like to hold off for now.  She will follow-up with [place] as needed.  Call with concerns or questions.  Follow-Up Instructions: Return if symptoms worsen or fail to improve.   Orders:  Orders Placed This Encounter  Procedures   XR Finger Thumb Right   No orders of the defined types were placed in this encounter.     Procedures: No procedures performed   Clinical Data: No additional findings.   Subjective: Chief Complaint  Patient presents with   Right Hand - Pain    Thumb pain    HPI patient is a pleasant 57 year old left-hand-dominant female who comes in today with right thumb pain.  A few weeks ago, she ran into a door with her thumb in a hyperflexed position.  She has had pain to the ulnar side of the first MCP joint since.  Pain is worse with flexion of the joint.  She does not take medication for this.  Review of Systems as detailed in HPI.  All others reviewed and are negative.   Objective: Vital Signs: LMP 02/10/2015 Comment: had BSO 2016  Physical Exam well-developed well-nourished female no acute distress.  Alert and oriented x 3.  Ortho Exam right thumb exam shows tenderness along the UCL.  She does have pain with stressing the UCL but there is no laxity.  She has mild swelling throughout the thumb.  She is neurovascularly intact distally.  Specialty Comments:  No specialty comments available.  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   Cervical radiculopathy 04/05/2018   Neurogenic claudication 04/05/2018   Allergic rhinitis 03/14/2017   Depression, recurrent (HCC) 05/31/2013   Morbid obesity (HCC) 12/04/2009   GAD (generalized anxiety disorder) 11/10/2006   GERD 11/10/2006   HYPERCHOLESTEROLEMIA, PURE 11/09/2006   Primary hypertension 11/09/2006   CHOLECYSTECTOMY, HX OF 11/09/2006   Past Medical History:  Diagnosis Date   Ankle fracture    left   Anxiety    Arthritis    Back pain    Bell's palsy    Left side, 2009, 2017   Bell's palsy    Depression    Diverticulitis    Dyspnea    GERD (gastroesophageal reflux disease)    Headache    History of hiatal hernia    Hyperlipidemia    Hypertension    Postoperative nausea 04/21/2016   Toe fracture    left great toe   Wears glasses  Family History  Problem Relation Age of Onset   COPD Mother    Hypertension Mother    GER disease Mother    Heart attack Father    Prostate cancer Father    Hyperlipidemia Brother    Hypertension Brother    Diabetes Brother    Neuropathy Brother    Hyperlipidemia Brother    Hypertension Brother    Cancer Paternal Uncle        prostate and bone   Cancer Paternal Uncle        prostate and bone   Heart attack Paternal Grandmother    Heart attack Paternal Grandfather    Stroke Paternal Grandfather    Colon cancer Neg Hx    Stomach cancer Neg Hx     Past Surgical History:  Procedure Laterality Date   BACK SURGERY  2004   l4/5 fusion   BREAST REDUCTION SURGERY Bilateral 08/08/2014   Procedure: BILATERAL BREAST REDUCTION   (BREAST);  Surgeon: Glenna Fellows, MD;  Location: [facility];  Service: Plastics;  Laterality: Bilateral;   CATARACT EXTRACTION Bilateral    CERVICAL DISCECTOMY  12/12/2018   3 level    CHOLECYSTECTOMY     1998   INJECTION KNEE Left    KNEE ARTHROSCOPY     LAPAROSCOPIC BILATERAL SALPINGO OOPHERECTOMY Bilateral 03/11/2015   Procedure: LAPAROSCOPIC BILATERAL SALPINGO OOPHORECTOMY;  Surgeon: Lazaro Arms, MD;  Location: [facility];  Service: Gynecology;  Laterality: Bilateral;   LAPAROSCOPIC GASTRIC SLEEVE RESECTION WITH HIATAL HERNIA REPAIR N/A 03/22/2016   Procedure: LAPAROSCOPIC GASTRIC SLEEVE RESECTION WITH HIATAL HERNIA REPAIR WITHUPPER ENDOSCOPY;  Surgeon: Ovidio Kin, MD;  Location: [facility];  Service: General;  Laterality: N/A;   REDUCTION MAMMAPLASTY Bilateral    SEPTOPLASTY  1988   tonsillectomy  1991   TONSILLECTOMY     Social History   Occupational History   Occupation: stay at home mom  Tobacco Use   Smoking status: Never   Smokeless tobacco: Never  Vaping Use   Vaping status: Never Used  Substance and Sexual Activity   Alcohol use: No   Drug use: No   Sexual activity: Not Currently    Partners: Male    Birth control/protection: Post-menopausal, Surgical    Comment: BSO 2016

## 2022-11-16 ENCOUNTER — Encounter: Payer: Self-pay | Admitting: Family Medicine

## 2022-11-16 ENCOUNTER — Ambulatory Visit (INDEPENDENT_AMBULATORY_CARE_PROVIDER_SITE_OTHER): Payer: Medicare HMO | Admitting: Family Medicine

## 2022-11-16 VITALS — BP 121/78 | HR 98 | Temp 97.5°F | Ht 64.5 in | Wt 174.0 lb

## 2022-11-16 DIAGNOSIS — Z8742 Personal history of other diseases of the female genital tract: Secondary | ICD-10-CM | POA: Diagnosis not present

## 2022-11-16 DIAGNOSIS — J069 Acute upper respiratory infection, unspecified: Secondary | ICD-10-CM

## 2022-11-16 DIAGNOSIS — R197 Diarrhea, unspecified: Secondary | ICD-10-CM

## 2022-11-16 DIAGNOSIS — Z8719 Personal history of other diseases of the digestive system: Secondary | ICD-10-CM | POA: Diagnosis not present

## 2022-11-16 DIAGNOSIS — R1032 Left lower quadrant pain: Secondary | ICD-10-CM | POA: Diagnosis not present

## 2022-11-16 MED ORDER — BENZONATATE 100 MG PO CAPS: 200.0000 mg | ORAL_CAPSULE | Freq: Three times a day (TID) | ORAL | 0 refills | Status: DC | PRN

## 2022-11-16 MED ORDER — PROMETHAZINE HCL 25 MG PO TABS: 25.0000 mg | ORAL_TABLET | Freq: Three times a day (TID) | ORAL | 0 refills | Status: AC | PRN

## 2022-11-16 MED ORDER — AMOXICILLIN-POT CLAVULANATE 875-125 MG PO TABS: 1.0000 | ORAL_TABLET | Freq: Two times a day (BID) | ORAL | 0 refills | Status: AC

## 2022-11-16 MED ORDER — FLUCONAZOLE 150 MG PO TABS: 150.0000 mg | ORAL_TABLET | Freq: Once | ORAL | 0 refills | Status: AC

## 2022-11-16 NOTE — Progress Notes (Signed)
Subjective:  Patient ID: [name], female    DOB: June 29, 1965, 57 y.o.   MRN: [id number]  Patient Care Team: [name], FNP as PCP - General (Family Medicine) Glenna Fellows, MD as Consulting Physician (Plastic Surgery)   Chief Complaint:  Abdominal Pain (LLQ) and Cough (Req to be covid tested. Husband positive this past Monday)   HPI: Cassandra Mcconnell is a 57 y.o. female presenting on 11/16/2022 for Abdominal Pain (LLQ) and Cough (Req to be covid tested. Husband positive this past Monday)   Abdominal Pain The current episode started 1 to 4 weeks ago. The onset quality is sudden. The problem occurs constantly. The problem has been waxing and waning. The pain is located in the LLQ. The pain is moderate. The quality of the pain is aching, colicky, cramping and sharp. The abdominal pain does not radiate. Associated symptoms include diarrhea and nausea. Pertinent negatives include no anorexia, arthralgias, belching, constipation, dysuria, fever, flatus, frequency, headaches, hematochezia, hematuria, melena, myalgias, vomiting or weight loss. The pain is aggravated by eating. The pain is relieved by Nothing. She has tried nothing for the symptoms. diverticulosis  Cough This is a new problem. The current episode started yesterday. The problem has been unchanged. The cough is Non-productive. Associated symptoms include ear congestion, nasal congestion and rhinorrhea. Pertinent negatives include no chest pain, chills, ear pain, fever, headaches, heartburn, hemoptysis, myalgias, postnasal drip, rash, sore throat, shortness of breath, sweats, weight loss or wheezing. Nothing aggravates the symptoms. She has tried nothing for the symptoms. diverticulosis      Relevant past medical, surgical, family, and social history reviewed and updated as indicated.  Allergies and medications reviewed and updated. Data reviewed: Chart in Epic.   Past Medical History:  Diagnosis Date   Ankle  fracture    left   Anxiety    Arthritis    Back pain    Bell's palsy    Left side, 2009, 2017   Bell's palsy    Depression    Diverticulitis    Dyspnea    GERD (gastroesophageal reflux disease)    Headache    History of hiatal hernia    Hyperlipidemia    Hypertension    Postoperative nausea 04/21/2016   Toe fracture    left great toe   Wears glasses     Past Surgical History:  Procedure Laterality Date   BACK SURGERY  2004   l4/5 fusion   BREAST REDUCTION SURGERY Bilateral 08/08/2014   Procedure: BILATERAL BREAST REDUCTION  (BREAST);  Surgeon: Glenna Fellows, MD;  Location: [facility];  Service: Plastics;  Laterality: Bilateral;   CATARACT EXTRACTION Bilateral    CERVICAL DISCECTOMY  12/12/2018   3 level    CHOLECYSTECTOMY     1998   INJECTION KNEE Left    KNEE ARTHROSCOPY     LAPAROSCOPIC BILATERAL SALPINGO OOPHERECTOMY Bilateral 03/11/2015   Procedure: LAPAROSCOPIC BILATERAL SALPINGO OOPHORECTOMY;  Surgeon: Lazaro Arms, MD;  Location: [facility];  Service: Gynecology;  Laterality: Bilateral;   LAPAROSCOPIC GASTRIC SLEEVE RESECTION WITH HIATAL HERNIA REPAIR N/A 03/22/2016   Procedure: LAPAROSCOPIC GASTRIC SLEEVE RESECTION WITH HIATAL HERNIA REPAIR WITHUPPER ENDOSCOPY;  Surgeon: Ovidio Kin, MD;  Location: [facility];  Service: General;  Laterality: N/A;   REDUCTION MAMMAPLASTY Bilateral    SEPTOPLASTY  1988   tonsillectomy  1991   TONSILLECTOMY      Social History   Socioeconomic History   Marital status: Married    Spouse  name: Not on file   Number of children: 1   Years of education: Not on file   Highest education level: 12th grade  Occupational History   Occupation: stay at home mom  Tobacco Use   Smoking status: Never   Smokeless tobacco: Never  Vaping Use   Vaping status: Never Used  Substance and Sexual Activity   Alcohol use: No   Drug use: No   Sexual activity: Not Currently    Partners: Male    Birth control/protection:  Post-menopausal, Surgical    Comment: BSO 2016  Other Topics Concern   Not on file  Social History Narrative   Lives at home with her husband   Left handed   Caffeine: 2-4 cups of coffee daily   Social Determinants of Health   Financial Resource Strain: Low Risk  (07/27/2022)   Overall Financial Resource Strain (CARDIA)    Difficulty of Paying Living Expenses: Not hard at all  Food Insecurity: No Food Insecurity (07/27/2022)   Hunger Vital Sign    Worried About Running Out of Food in the Last Year: Never true    Ran Out of Food in the Last Year: Never true  Transportation Needs: No Transportation Needs (07/27/2022)   PRAPARE - Administrator, Civil Service (Medical): No    Lack of Transportation (Non-Medical): No  Physical Activity: Inactive (07/27/2022)   Exercise Vital Sign    Days of Exercise per Week: 0 days    Minutes of Exercise per Session: 30 min  Stress: Stress Concern Present (07/27/2022)   [employer] of Occupational Health - Occupational Stress Questionnaire    Feeling of Stress : Rather much  Social Connections: Moderately Integrated (07/27/2022)   Social Connection and Isolation Panel [NHANES]    Frequency of Communication with Friends and Family: Once a week    Frequency of Social Gatherings with Friends and Family: Never    Attends Religious Services: More than 4 times per year    Active Member of [employer] or Organizations: Yes    Attends Banker Meetings: 1 to 4 times per year    Marital Status: Married  Catering manager Violence: Not At Risk (05/13/2022)   Humiliation, Afraid, Rape, and Kick questionnaire    Fear of Current or Ex-Partner: No    Emotionally Abused: No    Physically Abused: No    Sexually Abused: No    Outpatient Encounter Medications as of 11/16/2022  Medication Sig   acetaminophen (TYLENOL) 500 MG tablet Take 1,000 mg by mouth every 6 (six) hours as needed. For pain    amoxicillin-clavulanate (AUGMENTIN) 875-125 MG tablet  Take 1 tablet by mouth 2 (two) times daily for 10 days.   atorvastatin (LIPITOR) 40 MG tablet Take 1 tablet (40 mg total) by mouth daily.   benzonatate (TESSALON PERLES) 100 MG capsule Take 2 capsules (200 mg total) by mouth 3 (three) times daily as needed for cough.   citalopram (CELEXA) 20 MG tablet Take 1 tablet (20 mg total) by mouth 2 (two) times a day. (Patient taking differently: Take 20 mg by mouth daily.)   fluconazole (DIFLUCAN) 150 MG tablet Take 1 tablet (150 mg total) by mouth once for 1 dose.   ibuprofen (ADVIL) 200 MG tablet Take 800-1,200 mg by mouth every 8 (eight) hours as needed.   lisinopril (ZESTRIL) 20 MG tablet TAKE (1) TABLET BY MOUTH DAILY   Multiple Vitamin (MULTIVITAMIN) capsule Take by mouth.   Olopatadine HCl 0.2 % SOLN  Place 1 drop into both eyes daily as needed (allergies).   Propylene Glycol (SYSTANE BALANCE) 0.6 % SOLN Apply 1 drop to eye daily as needed (dry eyes).   SEMAGLUTIDE-WEIGHT MANAGEMENT [place] Inject 2 mg into the skin once a week. Compounded pharmacy   tiZANidine (ZANAFLEX) 4 MG tablet Take 1 tablet (4 mg total) by mouth every 8 (eight) hours as needed for muscle spasms.   [DISCONTINUED] promethazine (PHENERGAN) 25 MG tablet Take 1 tablet (25 mg total) by mouth every 8 (eight) hours as needed for nausea or vomiting.   promethazine (PHENERGAN) 25 MG tablet Take 1 tablet (25 mg total) by mouth every 8 (eight) hours as needed for nausea or vomiting.   [DISCONTINUED] Biotin 1 MG CAPS Take by mouth.   [DISCONTINUED] Calcium Carb-Cholecalciferol (CALCIUM HIGH POTENCY/VITAMIN D) 600-5 MG-MCG TABS Take by mouth.   [DISCONTINUED] cyanocobalamin (VITAMIN B12) 1000 MCG tablet Take by mouth.   [DISCONTINUED] desvenlafaxine (PRISTIQ) 50 MG 24 hr tablet Take 100 mg by mouth at bedtime.   No facility-administered encounter medications on file as of 11/16/2022.    Allergies  Allergen Reactions   Vistaril [Hydroxyzine] Shortness Of Breath    Dizzy, light headed     Zofran [Ondansetron Hcl] Shortness Of Breath and Rash    Numbness   Azithromycin Swelling   Conjugated Estrogens Other (See Comments)    emotional   Cyclobenzaprine Hcl     REACTION: disorientation   Hydrocodone     Other reaction(s): Unknown   Misc. Sulfonamide Containing Compounds Other (See Comments)   Norco [Hydrocodone-Acetaminophen] Other (See Comments)    Felt like she was going through withdrawal   Nsaids Other (See Comments)    Cannot have because of bariatric surgery   Oxycontin [Oxycodone]     Withdrawal feeling    Percocet [Oxycodone-Acetaminophen]     Hypotension    Robaxin [Methocarbamol]     Pt does not remember reaction    Rosuvastatin Dermatitis   Sulfa Antibiotics Hives and Itching   Wellbutrin [Bupropion Hcl] Other (See Comments)    Emotional, makes depression worse   Ciprofloxacin Itching and Rash   Dicyclomine Hcl Itching and Rash    Review of Systems  Constitutional:  Negative for activity change, appetite change, chills, diaphoresis, fatigue, fever, unexpected weight change and weight loss.  HENT:  Positive for congestion and rhinorrhea. Negative for dental problem, drooling, ear discharge, ear pain, facial swelling, hearing loss, mouth sores, nosebleeds, postnasal drip, sinus pressure, sinus pain, sneezing, sore throat, tinnitus, trouble swallowing and voice change.   Eyes:  Negative for photophobia and visual disturbance.  Respiratory:  Positive for cough. Negative for hemoptysis, shortness of breath and wheezing.   Cardiovascular:  Negative for chest pain.  Gastrointestinal:  Positive for abdominal pain, diarrhea and nausea. Negative for anorexia, constipation, flatus, heartburn, hematochezia, melena and vomiting.  Endocrine: Negative for polydipsia, polyphagia and polyuria.  Genitourinary:  Negative for decreased urine volume, difficulty urinating, dysuria, frequency and hematuria.  Musculoskeletal:  Negative for arthralgias and myalgias.  Skin:   Negative for rash.  Neurological:  Negative for dizziness, tremors, seizures, syncope, facial asymmetry, speech difficulty, weakness, light-headedness, numbness and headaches.  Psychiatric/Behavioral:  Negative for confusion.   All other systems reviewed and are negative.       Objective:  BP 121/78   Pulse 98   Temp (!) 97.5 F (36.4 C)   Ht 5' 4.5" (1.638 m)   Wt 174 lb (78.9 kg)   LMP 02/10/2015 Comment: had BSO  2016  SpO2 100%   BMI 29.41 kg/m    Wt Readings from Last 3 Encounters:  11/16/22 174 lb (78.9 kg)  08/31/22 192 lb 12.8 oz (87.5 kg)  07/27/22 200 lb (90.7 kg)    Physical Exam Vitals and nursing note reviewed.  Constitutional:      General: She is not in acute distress.    Appearance: Normal appearance. She is well-developed and well-groomed. She is not ill-appearing, toxic-appearing or diaphoretic.  HENT:     Head: Normocephalic and atraumatic.     Jaw: There is normal jaw occlusion.     Right Ear: Hearing normal.     Left Ear: Hearing normal.     Nose: Rhinorrhea present. Rhinorrhea is clear.     Mouth/Throat:     Lips: Pink.     Mouth: Mucous membranes are moist.     Pharynx: Oropharynx is clear. Uvula midline.  Eyes:     General: Lids are normal.     Extraocular Movements: Extraocular movements intact.     Conjunctiva/sclera: Conjunctivae normal.     Pupils: Pupils are equal, round, and reactive to light.  Neck:     Thyroid: No thyroid mass, thyromegaly or thyroid tenderness.     Vascular: No carotid bruit or JVD.     Trachea: Trachea and phonation normal.  Cardiovascular:     Rate and Rhythm: Normal rate and regular rhythm.     Chest Wall: PMI is not displaced.     Pulses: Normal pulses.     Heart sounds: Normal heart sounds. No murmur heard.    No friction rub. No gallop.  Pulmonary:     Effort: Pulmonary effort is normal. No respiratory distress.     Breath sounds: Normal breath sounds. No wheezing.  Abdominal:     General: Bowel sounds  are normal. There is no distension or abdominal bruit.     Palpations: Abdomen is soft. There is no hepatomegaly or splenomegaly.     Tenderness: There is abdominal tenderness in the left lower quadrant. There is no right CVA tenderness, left CVA tenderness, guarding or rebound. Negative signs include Murphy's sign, Rovsing's sign, McBurney's sign, psoas sign and obturator sign.     Hernia: No hernia is present.  Musculoskeletal:        General: Normal range of motion.     Cervical back: Normal range of motion and neck supple.     Right lower leg: No edema.     Left lower leg: No edema.  Lymphadenopathy:     Cervical: No cervical adenopathy.  Skin:    General: Skin is warm and dry.     Capillary Refill: Capillary refill takes less than 2 seconds.     Coloration: Skin is not cyanotic, jaundiced or pale.     Findings: No rash.  Neurological:     General: No focal deficit present.     Mental Status: She is alert and oriented to person, place, and time.     Cranial Nerves: Facial asymmetry (chronic) present.     Sensory: Sensation is intact.     Motor: Tremor (hands, chronic) present.     Coordination: Coordination is intact.     Gait: Gait is intact.     Deep Tendon Reflexes: Reflexes are normal and symmetric.  Psychiatric:        Attention and Perception: Attention and perception normal.        Mood and Affect: Mood and affect normal.  Speech: Speech normal.        Behavior: Behavior normal. Behavior is cooperative.        Thought Content: Thought content normal.        Cognition and Memory: Cognition and memory normal.        Judgment: Judgment normal.     Results for orders placed or performed in visit on 08/31/22  BMP8+EGFR  Result Value Ref Range   Glucose 87 70 - 99 mg/dL   BUN 14 6 - 24 mg/dL   Creatinine, Ser 8.46 0.57 - 1.00 mg/dL   eGFR 71 >96 EX/BMW/4.13   BUN/Creatinine Ratio 15 9 - 23   Sodium 143 134 - 144 mmol/L   Potassium 4.5 3.5 - 5.2 mmol/L    Chloride 106 96 - 106 mmol/L   CO2 20 20 - 29 mmol/L   Calcium 10.3 (H) 8.7 - 10.2 mg/dL  Calcium, ionized  Result Value Ref Range   Calcium, Ion 5.4 4.5 - 5.6 mg/dL  PTH, Intact and Calcium  Result Value Ref Range   PTH 35 15 - 65 pg/mL   PTH Interp Comment   Lipid panel  Result Value Ref Range   Cholesterol, Total 144 100 - 199 mg/dL   Triglycerides 89 0 - 149 mg/dL   HDL 44 >24 mg/dL   VLDL Cholesterol Cal 17 5 - 40 mg/dL   LDL Chol Calc (NIH) 83 0 - 99 mg/dL   Chol/HDL Ratio 3.3 0.0 - 4.4 ratio  Thyroid Panel With TSH  Result Value Ref Range   TSH 1.160 0.450 - 4.500 uIU/mL   T4, Total 6.8 4.5 - 12.0 ug/dL   T3 Uptake Ratio 27 24 - 39 %   Free Thyroxine Index 1.8 1.2 - 4.9  VITAMIN D 25 Hydroxy (Vit-D Deficiency, Fractures)  Result Value Ref Range   Vit D, 25-Hydroxy 57.8 30.0 - 100.0 ng/mL       Pertinent labs & imaging results that were available during my care of the patient were reviewed by me and considered in my medical decision making.  Assessment & Plan:  Jacee was seen today for abdominal pain and cough.  Diagnoses and all orders for this visit:  Left lower quadrant abdominal pain Diarrhea in adult patient History of diverticulitis Concerning for diverticulitis. Declined to go to hospital. Will treat with below along with dietary changes over the next 48 hours. Aware of red flags which require emergent evaluation and treatment. Report new, worsening, or persistent symptoms. -     amoxicillin-clavulanate (AUGMENTIN) 875-125 MG tablet; Take 1 tablet by mouth 2 (two) times daily for 10 days.  History of vaginitis -     fluconazole (DIFLUCAN) 150 MG tablet; Take 1 tablet (150 mg total) by mouth once for 1 dose.  URI with cough and congestion Symptomatic care discussed in detail. Tessalon as needed for cough. Testing pending per pts request. Report new, worsening, or persistent symptoms.  -     COVID-19, Flu A+B and RSV -     benzonatate (TESSALON PERLES) 100  MG capsule; Take 2 capsules (200 mg total) by mouth 3 (three) times daily as needed for cough.    Continue all other maintenance medications.  Follow up plan: Return if symptoms worsen or fail to improve.   Continue healthy lifestyle choices, including diet (rich in fruits, vegetables, and lean proteins, and low in salt and simple carbohydrates) and exercise (at least 30 minutes of moderate physical activity daily).  Educational handout given for  diverticulitis   The above assessment and management plan was discussed with the patient. The patient verbalized understanding of and has agreed to the management plan. Patient is aware to call the clinic if they develop any new symptoms or if symptoms persist or worsen. Patient is aware when to return to the clinic for a follow-up visit. Patient educated on when it is appropriate to go to the emergency department.   Kari Baars, FNP-C [facility] [phone]

## 2022-11-17 ENCOUNTER — Encounter: Payer: Self-pay | Admitting: Family Medicine

## 2022-11-17 DIAGNOSIS — U071 COVID-19: Secondary | ICD-10-CM

## 2022-11-17 LAB — COVID-19, FLU A+B AND RSV
Influenza B, NAA: NOT DETECTED
RSV, NAA: NOT DETECTED
SARS-CoV-2, NAA: DETECTED — AB

## 2022-11-18 ENCOUNTER — Other Ambulatory Visit: Payer: Self-pay | Admitting: Family Medicine

## 2022-11-18 DIAGNOSIS — U071 COVID-19: Secondary | ICD-10-CM

## 2022-11-18 MED ORDER — MOLNUPIRAVIR EUA 200MG CAPSULE: 4.0000 | ORAL_CAPSULE | Freq: Two times a day (BID) | ORAL | 0 refills | Status: AC

## 2022-11-18 MED ORDER — NIRMATRELVIR/RITONAVIR (PAXLOVID)TABLET: 3.0000 | ORAL_TABLET | Freq: Two times a day (BID) | ORAL | 0 refills | Status: AC

## 2022-11-18 NOTE — Progress Notes (Signed)
Molnupiravir unavailable, paxlovid sent in

## 2022-11-18 NOTE — Telephone Encounter (Signed)
COVID positive, less than 5 days of symptoms. Antiviral sent to pharmacy.

## 2022-11-24 DIAGNOSIS — M79671 Pain in right foot: Secondary | ICD-10-CM | POA: Diagnosis not present

## 2022-11-24 DIAGNOSIS — E669 Obesity, unspecified: Secondary | ICD-10-CM | POA: Diagnosis not present

## 2022-11-24 DIAGNOSIS — Z6830 Body mass index (BMI) 30.0-30.9, adult: Secondary | ICD-10-CM | POA: Diagnosis not present

## 2022-11-24 DIAGNOSIS — S93601A Unspecified sprain of right foot, initial encounter: Secondary | ICD-10-CM | POA: Diagnosis not present

## 2022-11-30 ENCOUNTER — Ambulatory Visit: Payer: Medicare HMO | Admitting: Family Medicine

## 2022-12-02 ENCOUNTER — Ambulatory Visit: Payer: Medicare HMO | Admitting: Family Medicine

## 2022-12-13 ENCOUNTER — Ambulatory Visit: Payer: Medicare HMO | Admitting: Family Medicine

## 2023-01-12 ENCOUNTER — Encounter: Payer: Self-pay | Admitting: Family Medicine

## 2023-01-13 ENCOUNTER — Ambulatory Visit: Payer: Medicare HMO | Admitting: Family Medicine

## 2023-01-19 DIAGNOSIS — F6381 Intermittent explosive disorder: Secondary | ICD-10-CM | POA: Diagnosis not present

## 2023-01-19 DIAGNOSIS — F331 Major depressive disorder, recurrent, moderate: Secondary | ICD-10-CM | POA: Diagnosis not present

## 2023-01-19 DIAGNOSIS — F411 Generalized anxiety disorder: Secondary | ICD-10-CM | POA: Diagnosis not present

## 2023-01-20 ENCOUNTER — Ambulatory Visit: Payer: Medicare HMO | Admitting: Family Medicine

## 2023-01-25 ENCOUNTER — Ambulatory Visit: Payer: Medicare HMO | Admitting: Family Medicine

## 2023-01-27 ENCOUNTER — Other Ambulatory Visit: Payer: Self-pay | Admitting: Family Medicine

## 2023-01-27 DIAGNOSIS — Z1211 Encounter for screening for malignant neoplasm of colon: Secondary | ICD-10-CM

## 2023-01-27 DIAGNOSIS — Z1212 Encounter for screening for malignant neoplasm of rectum: Secondary | ICD-10-CM

## 2023-02-20 DIAGNOSIS — F4323 Adjustment disorder with mixed anxiety and depressed mood: Secondary | ICD-10-CM | POA: Diagnosis not present

## 2023-03-07 DIAGNOSIS — F4323 Adjustment disorder with mixed anxiety and depressed mood: Secondary | ICD-10-CM | POA: Diagnosis not present

## 2023-03-20 DIAGNOSIS — F4323 Adjustment disorder with mixed anxiety and depressed mood: Secondary | ICD-10-CM | POA: Diagnosis not present

## 2023-04-17 DIAGNOSIS — F331 Major depressive disorder, recurrent, moderate: Secondary | ICD-10-CM | POA: Diagnosis not present

## 2023-04-17 DIAGNOSIS — F411 Generalized anxiety disorder: Secondary | ICD-10-CM | POA: Diagnosis not present

## 2023-04-17 DIAGNOSIS — F6381 Intermittent explosive disorder: Secondary | ICD-10-CM | POA: Diagnosis not present

## 2023-04-21 ENCOUNTER — Ambulatory Visit (INDEPENDENT_AMBULATORY_CARE_PROVIDER_SITE_OTHER): Payer: Medicare HMO | Admitting: Family Medicine

## 2023-04-21 DIAGNOSIS — E782 Mixed hyperlipidemia: Secondary | ICD-10-CM

## 2023-04-21 DIAGNOSIS — E559 Vitamin D deficiency, unspecified: Secondary | ICD-10-CM

## 2023-04-21 DIAGNOSIS — M25551 Pain in right hip: Secondary | ICD-10-CM

## 2023-04-21 DIAGNOSIS — I10 Essential (primary) hypertension: Secondary | ICD-10-CM | POA: Diagnosis not present

## 2023-04-21 DIAGNOSIS — Z1211 Encounter for screening for malignant neoplasm of colon: Secondary | ICD-10-CM

## 2023-04-21 DIAGNOSIS — Z7689 Persons encountering health services in other specified circumstances: Secondary | ICD-10-CM | POA: Diagnosis not present

## 2023-04-21 DIAGNOSIS — Z23 Encounter for immunization: Secondary | ICD-10-CM | POA: Diagnosis not present

## 2023-04-21 DIAGNOSIS — R6889 Other general symptoms and signs: Secondary | ICD-10-CM | POA: Diagnosis not present

## 2023-04-21 DIAGNOSIS — F339 Major depressive disorder, recurrent, unspecified: Secondary | ICD-10-CM

## 2023-04-21 DIAGNOSIS — Z1212 Encounter for screening for malignant neoplasm of rectum: Secondary | ICD-10-CM

## 2023-04-21 LAB — LIPID PANEL

## 2023-04-21 MED ORDER — IBUPROFEN 600 MG PO TABS: 600.0000 mg | ORAL_TABLET | Freq: Three times a day (TID) | ORAL | 3 refills | Status: DC | PRN

## 2023-04-21 NOTE — Progress Notes (Signed)
Subjective:  Patient ID: Cassandra Mcconnell, female    DOB: May 02, 1965, 57 y.o.   MRN: [id number]  Patient Care Team: [name], FNP as PCP - General (Family Medicine) Glenna Fellows, MD as Consulting Physician (Plastic Surgery)   Chief Complaint:  BMI (3 month follow up ) and Hip Pain (Right hip pain that goes into groin area x 2-3 weeks )   HPI: Cassandra Mcconnell is a 57 y.o. female presenting on 04/21/2023 for BMI (3 month follow up ) and Hip Pain (Right hip pain that goes into groin area x 2-3 weeks )   Discussed the use of AI scribe software for clinical note transcription with the patient, who gave verbal consent to proceed.  History of Present Illness   The patient, with a history of obesity, hypertension, and hyperlipidemia, presents for a follow-up visit primarily concerning their weight management. They report a significant weight loss of 90 pounds over the past year, largely attributed to the use of semaglutide, which they restarted three weeks ago after a brief discontinuation due to cost and stress. They deny any side effects from the medication.  The patient also reports discontinuing their lisinopril three days ago due to well-controlled blood pressure readings. They continue to take atorvastatin and Celexa.  A new concern is a persistent, achy pain in the right hip, which they believe is due to inflammation. The pain is constant and does not radiate down the leg. It is severe enough to affect their driving and is somewhat relieved by heat.  The patient also discusses significant psychosocial stressors, including multiple acquaintances with recent suicide attempts and a daughter with drug addiction. They express frustration with their current therapist and a desire to change their mindset. 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  The patient is  also under the care of a psychiatrist and was previously followed by a neurosurgeon, who has since released them from care.          Relevant past medical, surgical, family, and social history reviewed and updated as indicated.  Allergies and medications reviewed and updated. Data reviewed: Chart in Epic.   Past Medical History:  Diagnosis Date   Ankle fracture    left   Anxiety    Arthritis    Back pain    Bell's palsy    Left side, 2009, 2017   Bell's palsy    Depression    Diverticulitis    Dyspnea    GERD (gastroesophageal reflux disease)    Headache    History of hiatal hernia    Hyperlipidemia    Hypertension    Postoperative nausea 04/21/2016   Toe fracture    left great toe   Wears glasses     Past Surgical History:  Procedure Laterality Date   BACK SURGERY  2004   l4/5 fusion   BREAST REDUCTION SURGERY Bilateral 08/08/2014   Procedure: BILATERAL BREAST REDUCTION  (BREAST);  Surgeon: Glenna Fellows, MD;  Location: [facility];  Service: Plastics;  Laterality: Bilateral;   CATARACT EXTRACTION Bilateral    CERVICAL DISCECTOMY  12/12/2018   3 level    CHOLECYSTECTOMY     1998   INJECTION KNEE Left    KNEE ARTHROSCOPY     LAPAROSCOPIC BILATERAL SALPINGO OOPHERECTOMY Bilateral 03/11/2015   Procedure: LAPAROSCOPIC BILATERAL SALPINGO OOPHORECTOMY;  Surgeon: Amaryllis Dyke  Despina Hidden, MD;  Location: [facility];  Service: Gynecology;  Laterality: Bilateral;   LAPAROSCOPIC GASTRIC SLEEVE RESECTION WITH HIATAL HERNIA REPAIR N/A 03/22/2016   Procedure: LAPAROSCOPIC GASTRIC SLEEVE RESECTION WITH HIATAL HERNIA REPAIR WITHUPPER ENDOSCOPY;  Surgeon: Ovidio Kin, MD;  Location: [facility];  Service: General;  Laterality: N/A;   REDUCTION MAMMAPLASTY Bilateral    SEPTOPLASTY  1988   tonsillectomy  1991   TONSILLECTOMY      Social History   Socioeconomic History   Marital status: Married    Spouse name: Not on file   Number of children: 1   Years of education: Not on  file   Highest education level: Some college, no degree  Occupational History   Occupation: stay at home mom  Tobacco Use   Smoking status: Never   Smokeless tobacco: Never  Vaping Use   Vaping status: Never Used  Substance and Sexual Activity   Alcohol use: No   Drug use: No   Sexual activity: Not Currently    Partners: Male    Birth control/protection: Post-menopausal, Surgical    Comment: BSO 2016  Other Topics Concern   Not on file  Social History Narrative   Lives at home with her husband   Left handed   Caffeine: 2-4 cups of coffee daily   Social Drivers of Health   Financial Resource Strain: Low Risk  (04/21/2023)   Overall Financial Resource Strain (CARDIA)    Difficulty of Paying Living Expenses: Not very hard  Food Insecurity: No Food Insecurity (04/21/2023)   Hunger Vital Sign    Worried About Running Out of Food in the Last Year: Never true    Ran Out of Food in the Last Year: Never true  Transportation Needs: No Transportation Needs (04/21/2023)   PRAPARE - Administrator, Civil Service (Medical): No    Lack of Transportation (Non-Medical): No  Physical Activity: Insufficiently Active (04/21/2023)   Exercise Vital Sign    Days of Exercise per Week: 2 days    Minutes of Exercise per Session: 20 min  Stress: Stress Concern Present (04/21/2023)   [employer] of Occupational Health - Occupational Stress Questionnaire    Feeling of Stress : To some extent  Social Connections: Socially Integrated (04/21/2023)   Social Connection and Isolation Panel [NHANES]    Frequency of Communication with Friends and Family: More than three times a week    Frequency of Social Gatherings with Friends and Family: Never    Attends Religious Services: More than 4 times per year    Active Member of [employer] or Organizations: Yes    Attends Engineer, structural: More than 4 times per year    Marital Status: Married  Catering manager Violence: Not At Risk  (05/13/2022)   Humiliation, Afraid, Rape, and Kick questionnaire    Fear of Current or Ex-Partner: No    Emotionally Abused: No    Physically Abused: No    Sexually Abused: No    Outpatient Encounter Medications as of 04/21/2023  Medication Sig   acetaminophen (TYLENOL) 500 MG tablet Take 1,000 mg by mouth every 6 (six) hours as needed. For pain    atorvastatin (LIPITOR) 40 MG tablet Take 1 tablet (40 mg total) by mouth daily.   citalopram (CELEXA) 40 MG tablet Take 40 mg by mouth daily.   desvenlafaxine (PRISTIQ) 100 MG 24 hr tablet Take 100 mg by mouth at bedtime.   ibuprofen (ADVIL) 600 MG tablet Take 1 tablet (600 mg  total) by mouth every 8 (eight) hours as needed.   Olopatadine HCl 0.2 % SOLN Place 1 drop into both eyes daily as needed (allergies).   promethazine (PHENERGAN) 25 MG tablet Take 1 tablet (25 mg total) by mouth every 8 (eight) hours as needed for nausea or vomiting.   Propylene Glycol (SYSTANE BALANCE) 0.6 % SOLN Apply 1 drop to eye daily as needed (dry eyes).   SEMAGLUTIDE-WEIGHT MANAGEMENT [place] Inject 2 mg into the skin once a week. Compounded pharmacy   [DISCONTINUED] ibuprofen (ADVIL) 200 MG tablet Take 800-1,200 mg by mouth every 8 (eight) hours as needed.   [DISCONTINUED] benzonatate (TESSALON PERLES) 100 MG capsule Take 2 capsules (200 mg total) by mouth 3 (three) times daily as needed for cough.   [DISCONTINUED] citalopram (CELEXA) 20 MG tablet Take 1 tablet (20 mg total) by mouth 2 (two) times a day. (Patient taking differently: Take 20 mg by mouth daily.)   [DISCONTINUED] lisinopril (ZESTRIL) 20 MG tablet TAKE (1) TABLET BY MOUTH DAILY (Patient not taking: Reported on 04/21/2023)   [DISCONTINUED] Multiple Vitamin (MULTIVITAMIN) capsule Take by mouth.   [DISCONTINUED] tiZANidine (ZANAFLEX) 4 MG tablet Take 1 tablet (4 mg total) by mouth every 8 (eight) hours as needed for muscle spasms.   No facility-administered encounter medications on file as of 04/21/2023.     Allergies  Allergen Reactions   Vistaril [Hydroxyzine] Shortness Of Breath    Dizzy, light headed    Zofran [Ondansetron Hcl] Shortness Of Breath and Rash    Numbness   Azithromycin Swelling   Conjugated Estrogens Other (See Comments)    emotional   Cyclobenzaprine Hcl     REACTION: disorientation   Hydrocodone     Other reaction(s): Unknown   Misc. Sulfonamide Containing Compounds Other (See Comments)   Norco [Hydrocodone-Acetaminophen] Other (See Comments)    Felt like she was going through withdrawal   Nsaids Other (See Comments)    Cannot have because of bariatric surgery   Oxycontin [Oxycodone]     Withdrawal feeling    Percocet [Oxycodone-Acetaminophen]     Hypotension    Robaxin [Methocarbamol]     Pt does not remember reaction    Rosuvastatin Dermatitis   Sulfa Antibiotics Hives and Itching   Wellbutrin [Bupropion Hcl] Other (See Comments)    Emotional, makes depression worse   Ciprofloxacin Itching and Rash   Dicyclomine Hcl Itching and Rash    Pertinent ROS per HPI, otherwise unremarkable      Objective:  BP 116/67   Pulse 63   Temp (!) 97.5 F (36.4 C)   Ht 5' 4.5" (1.638 m)   Wt 173 lb 9.6 oz (78.7 kg)   LMP 02/10/2015 Comment: had BSO 2016  SpO2 100%   BMI 29.34 kg/m    Wt Readings from Last 3 Encounters:  04/21/23 173 lb 9.6 oz (78.7 kg)  11/16/22 174 lb (78.9 kg)  08/31/22 192 lb 12.8 oz (87.5 kg)    Physical Exam Vitals and nursing note reviewed.  Constitutional:      General: She is not in acute distress.    Appearance: Normal appearance. She is well-developed and well-groomed. She is not ill-appearing, toxic-appearing or diaphoretic.  HENT:     Head: Normocephalic and atraumatic.     Jaw: There is normal jaw occlusion.     Right Ear: Hearing normal.     Left Ear: Hearing normal.     Nose: Nose normal.     Mouth/Throat:     Lips:  Pink.     Mouth: Mucous membranes are moist.     Pharynx: Oropharynx is clear. Uvula midline.   Eyes:     General: Lids are normal.     Extraocular Movements: Extraocular movements intact.     Conjunctiva/sclera: Conjunctivae normal.     Pupils: Pupils are equal, round, and reactive to light.  Neck:     Trachea: Trachea and phonation normal.  Cardiovascular:     Rate and Rhythm: Normal rate and regular rhythm.     Pulses: Normal pulses.     Heart sounds: Normal heart sounds. No murmur heard.    No friction rub. No gallop.  Pulmonary:     Effort: Pulmonary effort is normal. No respiratory distress.     Breath sounds: Normal breath sounds. No wheezing.  Abdominal:     General: Bowel sounds are normal.     Palpations: Abdomen is soft.  Musculoskeletal:        General: Normal range of motion.     Cervical back: Normal range of motion and neck supple.     Right lower leg: No edema.     Left lower leg: No edema.  Skin:    General: Skin is warm and dry.     Capillary Refill: Capillary refill takes less than 2 seconds.     Coloration: Skin is not cyanotic, jaundiced or pale.     Findings: No rash.  Neurological:     General: No focal deficit present.     Mental Status: She is alert and oriented to person, place, and time.     Cranial Nerves: Facial asymmetry (chronic) present.     Sensory: Sensation is intact.     Motor: Tremor (bilateral hands, chronic) present.     Coordination: Coordination is intact.     Gait: Gait is intact.     Deep Tendon Reflexes: Reflexes are normal and symmetric.  Psychiatric:        Attention and Perception: Attention and perception normal.        Mood and Affect: Mood and affect normal.        Speech: Speech normal.        Behavior: Behavior normal. Behavior is cooperative.        Thought Content: Thought content normal.        Cognition and Memory: Cognition and memory normal.        Judgment: Judgment normal.     Results for orders placed or performed in visit on 11/16/22  COVID-19, Flu A+B and RSV   Collection Time: 11/16/22  2:13 PM    Specimen: Nasopharyngeal(NP) swabs in vial transport medium  Result Value Ref Range   SARS-CoV-2, NAA Detected (A) Not Detected   Influenza A, NAA Not Detected Not Detected   Influenza B, NAA Not Detected Not Detected   RSV, NAA Not Detected Not Detected   Test Information: Comment        Pertinent labs & imaging results that were available during my care of the patient were reviewed by me and considered in my medical decision making.  Assessment & Plan:  Zulma was seen today for bmi and hip pain.  Diagnoses and all orders for this visit:  Morbid obesity (HCC) -     CMP14+EGFR -     CBC with Differential/Platelet -     VITAMIN D 25 Hydroxy (Vit-D Deficiency, Fractures) -     Thyroid Panel With TSH -     Lipid panel  Encounter  for weight management -     CMP14+EGFR -     CBC with Differential/Platelet -     VITAMIN D 25 Hydroxy (Vit-D Deficiency, Fractures) -     Thyroid Panel With TSH -     Lipid panel  Mixed hyperlipidemia -     CMP14+EGFR -     Lipid panel  Vitamin D deficiency -     CMP14+EGFR -     VITAMIN D 25 Hydroxy (Vit-D Deficiency, Fractures)  Primary hypertension -     CMP14+EGFR -     CBC with Differential/Platelet -     Thyroid Panel With TSH -     Lipid panel  Right hip pain -     ibuprofen (ADVIL) 600 MG tablet; Take 1 tablet (600 mg total) by mouth every 8 (eight) hours as needed.  Screening for colorectal cancer -     Cologuard  Depression, recurrent (HCC) -     Thyroid Panel With TSH -     Vitamin B12  Immunization due -     Flu vaccine trivalent PF, 6mos and older(Flulaval,Afluria,Fluarix,Fluzone)     Assessment and Plan    Obesity Resumed semaglutide three weeks ago, resulting in a five-pound weight loss without side effects. Total weight loss is ninety pounds, current weight is 171 pounds, aiming for 155-165 pounds for a normal BMI. Discussed continued weight monitoring and potential dose adjustments. - Continue semaglutide -  Monitor weight and adjust goals as needed  Hypertension Discontinued lisinopril three days ago due to well-controlled blood pressure. Advised to monitor blood pressure closely and restart medication at a lower dose if it increases. - Monitor blood pressure - Restart lisinopril at a lower dose if blood pressure increases  Hyperlipidemia Continues atorvastatin with no new concerns. - Continue atorvastatin  Depression Continues Celexa. Experiencing significant emotional stress due to multiple traumatic events. Unsatisfactory experiences with a therapist, not continued therapy. Discussed finding a supportive therapist and potential benefits of therapy. - Continue Celexa - Consider referral to a new therapist  Right Hip Pain Persistent aching pain likely due to nerve inflammation, exacerbated by prolonged sitting and driving, relieved by heat. No radiation down the leg. Discussed potential benefits of physical therapy and continued heat therapy. - Consider physical therapy referral - Recommend continued use of heat therapy - Evaluate further if pain persists or worsens  General Health Maintenance Needs updated labs for kidney function, thyroid function, calcium, and cholesterol. Signed for a flu shot. Discussed importance of regular lab monitoring for medication safety and efficacy. - Order labs for kidney function, thyroid function, calcium, and cholesterol - Administer flu shot  Follow-up - Schedule follow-up appointment after lab results are available.          Continue all other maintenance medications.  Follow up plan: Return in about 4 months (around 08/20/2023), or if symptoms worsen or fail to improve, for BMI.   Continue healthy lifestyle choices, including diet (rich in fruits, vegetables, and lean proteins, and low in salt and simple carbohydrates) and exercise (at least 30 minutes of moderate physical activity daily).  Educational handout given for calorie  counting  The above assessment and management plan was discussed with the patient. The patient verbalized understanding of and has agreed to the management plan. Patient is aware to call the clinic if they develop any new symptoms or if symptoms persist or worsen. Patient is aware when to return to the clinic for a follow-up visit. Patient educated on when it is  appropriate to go to the emergency department.   Kari Baars, FNP-C [facility] [phone]

## 2023-04-22 LAB — LIPID PANEL
Cholesterol, Total: 169 mg/dL (ref 100–199)
HDL: 43 mg/dL (ref >39)
LDL CALC COMMENT:: 3.9 ratio (ref 0.0–4.4)
LDL Chol Calc (NIH): 104 mg/dL — ABNORMAL HIGH (ref 0–99)
Triglycerides: 124 mg/dL (ref 0–149)
VLDL Cholesterol Cal: 22 mg/dL (ref 5–40)

## 2023-04-22 LAB — VITAMIN D 25 HYDROXY (VIT D DEFICIENCY, FRACTURES): Vit D, 25-Hydroxy: 40.6 ng/mL (ref 30.0–100.0)

## 2023-04-22 LAB — CMP14+EGFR
ALT: 19 IU/L (ref 0–32)
AST: 27 IU/L (ref 0–40)
Albumin: 4.3 g/dL (ref 3.8–4.9)
Alkaline Phosphatase: 90 IU/L (ref 44–121)
BUN/Creatinine Ratio: 11 (ref 9–23)
BUN: 10 mg/dL (ref 6–24)
Bilirubin Total: 0.5 mg/dL (ref 0.0–1.2)
CO2: 22 mmol/L (ref 20–29)
Calcium: 9.5 mg/dL (ref 8.7–10.2)
Chloride: 103 mmol/L (ref 96–106)
Creatinine, Ser: 0.92 mg/dL (ref 0.57–1.00)
Globulin, Total: 2.3 g/dL (ref 1.5–4.5)
Glucose: 83 mg/dL (ref 70–99)
Potassium: 3.8 mmol/L (ref 3.5–5.2)
Sodium: 141 mmol/L (ref 134–144)
Total Protein: 6.6 g/dL (ref 6.0–8.5)
eGFR: 72 mL/min/{1.73_m2} (ref >59)

## 2023-04-22 LAB — CBC WITH DIFFERENTIAL/PLATELET
Basophils Absolute: 0 10*3/uL (ref 0.0–0.2)
Basos: 1 %
EOS (ABSOLUTE): 0 10*3/uL (ref 0.0–0.4)
Eos: 1 %
Hematocrit: 40.3 % (ref 34.0–46.6)
Hemoglobin: 13 g/dL (ref 11.1–15.9)
Immature Grans (Abs): 0 10*3/uL (ref 0.0–0.1)
Immature Granulocytes: 0 %
Lymphocytes Absolute: 1.9 10*3/uL (ref 0.7–3.1)
Lymphs: 32 %
MCH: 29 pg (ref 26.6–33.0)
MCHC: 32.3 g/dL (ref 31.5–35.7)
MCV: 90 fL (ref 79–97)
Monocytes Absolute: 0.6 10*3/uL (ref 0.1–0.9)
Monocytes: 10 %
Neutrophils Absolute: 3.3 10*3/uL (ref 1.4–7.0)
Neutrophils: 56 %
Platelets: 199 10*3/uL (ref 150–450)
RBC: 4.49 x10E6/uL (ref 3.77–5.28)
RDW: 12.7 % (ref 11.7–15.4)
WBC: 5.8 10*3/uL (ref 3.4–10.8)

## 2023-04-22 LAB — THYROID PANEL WITH TSH
Free Thyroxine Index: 1.6 (ref 1.2–4.9)
T3 Uptake Ratio: 26 % (ref 24–39)
T4, Total: 6.2 ug/dL (ref 4.5–12.0)
TSH: 1.49 u[IU]/mL (ref 0.450–4.500)

## 2023-04-22 LAB — VITAMIN B12: Vitamin B-12: 739 pg/mL (ref 232–1245)

## 2023-05-10 ENCOUNTER — Other Ambulatory Visit: Payer: Self-pay | Admitting: Medical Genetics

## 2023-05-11 ENCOUNTER — Other Ambulatory Visit (HOSPITAL_COMMUNITY): Payer: HMO | Attending: Medical Genetics

## 2023-05-15 ENCOUNTER — Ambulatory Visit (INDEPENDENT_AMBULATORY_CARE_PROVIDER_SITE_OTHER): Payer: HMO

## 2023-05-15 VITALS — Ht 64.5 in | Wt 173.0 lb

## 2023-05-15 DIAGNOSIS — Z1211 Encounter for screening for malignant neoplasm of colon: Secondary | ICD-10-CM | POA: Diagnosis not present

## 2023-05-15 DIAGNOSIS — Z1231 Encounter for screening mammogram for malignant neoplasm of breast: Secondary | ICD-10-CM

## 2023-05-15 DIAGNOSIS — Z0000 Encounter for general adult medical examination without abnormal findings: Secondary | ICD-10-CM

## 2023-05-15 DIAGNOSIS — Z1212 Encounter for screening for malignant neoplasm of rectum: Secondary | ICD-10-CM | POA: Diagnosis not present

## 2023-05-15 NOTE — Progress Notes (Signed)
Subjective:   Cassandra Mcconnell is a 58 y.o. female who presents for Medicare Annual (Subsequent) preventive examination.  Visit Complete: Virtual I connected with  Cassandra Mcconnell on 05/15/23 by a audio enabled telemedicine application and verified that I am speaking with the correct person using two identifiers.  Patient Location: Home  Provider Location: Home Office  This patient declined Interactive audio and video telecommunications. Therefore the visit was completed with audio only.  I discussed the limitations of evaluation and management by telemedicine. The patient expressed understanding and agreed to proceed.  Vital Signs: Because this visit was a virtual/telehealth visit, some criteria may be missing or patient reported. Any vitals not documented were not able to be obtained and vitals that have been documented are patient reported.  Cardiac Risk Factors include: advanced age (>73men, >64 women);dyslipidemia     Objective:    Today's Vitals   05/15/23 1330  Weight: 173 lb (78.5 kg)  Height: 5' 4.5" (1.638 m)   Body mass index is 29.24 kg/m.     05/15/2023    1:34 PM 05/13/2022    2:13 PM 09/08/2021   12:58 PM 05/12/2021    1:27 PM 02/01/2019    9:11 PM 04/30/2018    9:48 AM 04/27/2018   11:19 AM  Advanced Directives  Does Patient Have a Medical Advance Directive? No Yes Yes Yes No Yes Yes  Type of Special educational needs teacher of [place];Living will Healthcare Power of [place];Living will Healthcare Power of [place];Living will  Healthcare Power of [place];Living will Healthcare Power of [place];Living will  Does patient want to make changes to medical advance directive?   No - Patient declined      Copy of Healthcare Power of Attorney in Chart?  No - copy requested No - copy requested No - copy requested     Would patient like information on creating a medical advance directive? Yes (MAU/Ambulatory/Procedural Areas - Information given)    No - Patient declined       Current Medications (verified) Outpatient Encounter Medications as of 05/15/2023  Medication Sig   acetaminophen (TYLENOL) 500 MG tablet Take 1,000 mg by mouth every 6 (six) hours as needed. For pain    atorvastatin (LIPITOR) 40 MG tablet Take 1 tablet (40 mg total) by mouth daily.   citalopram (CELEXA) 40 MG tablet Take 40 mg by mouth daily.   desvenlafaxine (PRISTIQ) 100 MG 24 hr tablet Take 100 mg by mouth at bedtime.   ibuprofen (ADVIL) 600 MG tablet Take 1 tablet (600 mg total) by mouth every 8 (eight) hours as needed.   Olopatadine HCl 0.2 % SOLN Place 1 drop into both eyes daily as needed (allergies).   promethazine (PHENERGAN) 25 MG tablet Take 1 tablet (25 mg total) by mouth every 8 (eight) hours as needed for nausea or vomiting.   Propylene Glycol (SYSTANE BALANCE) 0.6 % SOLN Apply 1 drop to eye daily as needed (dry eyes).   SEMAGLUTIDE-WEIGHT MANAGEMENT [place] Inject 2 mg into the skin once a week. Compounded pharmacy   [DISCONTINUED] Biotin 1 MG CAPS Take by mouth.   [DISCONTINUED] Calcium Carb-Cholecalciferol (CALCIUM HIGH POTENCY/VITAMIN D) 600-5 MG-MCG TABS Take by mouth.   [DISCONTINUED] citalopram (CELEXA) 20 MG tablet Take 1 tablet (20 mg total) by mouth 2 (two) times a day. (Patient taking differently: Take 20 mg by mouth daily.)   [DISCONTINUED] cyanocobalamin (VITAMIN B12) 1000 MCG tablet Take by mouth.   [DISCONTINUED] desvenlafaxine (PRISTIQ) 50 MG 24 hr  tablet Take 100 mg by mouth at bedtime.   [DISCONTINUED] ibuprofen (ADVIL) 200 MG tablet Take 800-1,200 mg by mouth every 8 (eight) hours as needed.   [DISCONTINUED] lisinopril (ZESTRIL) 20 MG tablet Take 1 tablet (20 mg total) by mouth daily.   [DISCONTINUED] Multiple Vitamin (MULTIVITAMIN) capsule Take by mouth.   [DISCONTINUED] promethazine (PHENERGAN) 25 MG tablet Take 1 tablet (25 mg total) by mouth every 8 (eight) hours as needed for nausea or vomiting.   [DISCONTINUED] tiZANidine (ZANAFLEX) 4 MG tablet Take 1  tablet (4 mg total) by mouth every 8 (eight) hours as needed for muscle spasms.   No facility-administered encounter medications on file as of 05/15/2023.    Allergies (verified) Vistaril [hydroxyzine], Zofran [ondansetron hcl], Azithromycin, Conjugated estrogens, Cyclobenzaprine hcl, Hydrocodone, Misc. sulfonamide containing compounds, Norco [hydrocodone-acetaminophen], Nsaids, Oxycontin [oxycodone], Percocet [oxycodone-acetaminophen], Robaxin [methocarbamol], Rosuvastatin, Sulfa antibiotics, Wellbutrin [bupropion hcl], Ciprofloxacin, and Dicyclomine hcl   History: Past Medical History:  Diagnosis Date   Ankle fracture    left   Anxiety    Arthritis    Back pain    Bell's palsy    Left side, 2009, 2017   Bell's palsy    Depression    Diverticulitis    Dyspnea    GERD (gastroesophageal reflux disease)    Headache    History of hiatal hernia    Hyperlipidemia    Hypertension    Postoperative nausea 04/21/2016   Toe fracture    left great toe   Wears glasses    Past Surgical History:  Procedure Laterality Date   BACK SURGERY  2004   l4/5 fusion   BREAST REDUCTION SURGERY Bilateral 08/08/2014   Procedure: BILATERAL BREAST REDUCTION  (BREAST);  Surgeon: Glenna Fellows, MD;  Location: [facility];  Service: Plastics;  Laterality: Bilateral;   CATARACT EXTRACTION Bilateral    CERVICAL DISCECTOMY  12/12/2018   3 level    CHOLECYSTECTOMY     1998   INJECTION KNEE Left    KNEE ARTHROSCOPY     LAPAROSCOPIC BILATERAL SALPINGO OOPHERECTOMY Bilateral 03/11/2015   Procedure: LAPAROSCOPIC BILATERAL SALPINGO OOPHORECTOMY;  Surgeon: Lazaro Arms, MD;  Location: [facility];  Service: Gynecology;  Laterality: Bilateral;   LAPAROSCOPIC GASTRIC SLEEVE RESECTION WITH HIATAL HERNIA REPAIR N/A 03/22/2016   Procedure: LAPAROSCOPIC GASTRIC SLEEVE RESECTION WITH HIATAL HERNIA REPAIR WITHUPPER ENDOSCOPY;  Surgeon: Ovidio Kin, MD;  Location: [facility];  Service: General;  Laterality:  N/A;   REDUCTION MAMMAPLASTY Bilateral    SEPTOPLASTY  1988   tonsillectomy  1991   TONSILLECTOMY     Family History  Problem Relation Age of Onset   COPD Mother    Hypertension Mother    GER disease Mother    Heart attack Father    Prostate cancer Father    Hyperlipidemia Brother    Hypertension Brother    Diabetes Brother    Neuropathy Brother    Hyperlipidemia Brother    Hypertension Brother    Cancer Paternal Uncle        prostate and bone   Cancer Paternal Uncle        prostate and bone   Heart attack Paternal Grandmother    Heart attack Paternal Grandfather    Stroke Paternal Grandfather    Colon cancer Neg Hx    Stomach cancer Neg Hx    Social History   Socioeconomic History   Marital status: Married    Spouse name: Not on file   Number of children: 1  Years of education: Not on file   Highest education level: Some college, no degree  Occupational History   Occupation: stay at home mom  Tobacco Use   Smoking status: Never   Smokeless tobacco: Never  Vaping Use   Vaping status: Never Used  Substance and Sexual Activity   Alcohol use: No   Drug use: No   Sexual activity: Not Currently    Partners: Male    Birth control/protection: Post-menopausal, Surgical    Comment: BSO 2016  Other Topics Concern   Not on file  Social History Narrative   Lives at home with her husband   Left handed   Caffeine: 2-4 cups of coffee daily   Social Drivers of Health   Financial Resource Strain: Low Risk  (05/15/2023)   Overall Financial Resource Strain (CARDIA)    Difficulty of Paying Living Expenses: Not very hard  Food Insecurity: No Food Insecurity (05/15/2023)   Hunger Vital Sign    Worried About Running Out of Food in the Last Year: Never true    Ran Out of Food in the Last Year: Never true  Transportation Needs: No Transportation Needs (05/15/2023)   PRAPARE - Administrator, Civil Service (Medical): No    Lack of Transportation (Non-Medical): No   Physical Activity: Insufficiently Active (05/15/2023)   Exercise Vital Sign    Days of Exercise per Week: 3 days    Minutes of Exercise per Session: 30 min  Stress: Stress Concern Present (05/15/2023)   [employer] of Occupational Health - Occupational Stress Questionnaire    Feeling of Stress : To some extent  Social Connections: Socially Integrated (05/15/2023)   Social Connection and Isolation Panel [NHANES]    Frequency of Communication with Friends and Family: More than three times a week    Frequency of Social Gatherings with Friends and Family: Three times a week    Attends Religious Services: More than 4 times per year    Active Member of Clubs or Organizations: Yes    Attends Banker Meetings: 1 to 4 times per year    Marital Status: Married    Tobacco Counseling Counseling given: Not Answered   Clinical Intake:  Pre-visit preparation completed: Yes  Pain : No/denies pain     Diabetes: No  How often do you need to have someone help you when you read instructions, pamphlets, or other written materials from your doctor or pharmacy?: 1 - Never  Interpreter Needed?: No  Information entered by :: Kandis Fantasia LPN   Activities of Daily Living    05/15/2023    1:34 PM  In your present state of health, do you have any difficulty performing the following activities:  Hearing? 0  Vision? 0  Difficulty concentrating or making decisions? 0  Walking or climbing stairs? 0  Dressing or bathing? 0  Doing errands, shopping? 0  Preparing Food and eating ? N  Using the Toilet? N  In the past six months, have you accidently leaked urine? N  Do you have problems with loss of bowel control? N  Managing your Medications? N  Managing your Finances? N  Housekeeping or managing your Housekeeping? N    Patient Care Team: [name], FNP as PCP - General (Family Medicine) Glenna Fellows, MD as Consulting Physician (Plastic Surgery) Fredonia Highland,  MD as Consulting Physician [facility]) Thedore Mins, MD as Consulting Physician (Psychiatry)  Indicate any recent Medical Services you may have received from other  than [facility] providers in the past year (date may be approximate).     Assessment:   This is a routine wellness examination for Cassandra Mcconnell.  Hearing/Vision screen Hearing Screening - Comments:: Denies hearing difficulties   Vision Screening - Comments:: Wears rx glasses - up to date with routine eye exams with [facility]      Goals Addressed   None   Depression Screen    05/15/2023    1:33 PM 04/21/2023   11:52 AM 11/16/2022   10:58 AM 08/31/2022   11:39 AM 07/27/2022    9:58 AM 07/15/2022   10:38 AM 05/13/2022    2:12 PM  PHQ 2/9 Scores  PHQ - 2 Score 1 1 0 0 2 5 0  PHQ- 9 Score 7 7  6 11 13      Fall Risk    05/15/2023    1:34 PM 04/21/2023   11:52 AM 11/16/2022   10:58 AM 08/31/2022   11:39 AM 07/27/2022    9:58 AM  Fall Risk   Falls in the past year? 1 1 0 0 1  Number falls in past yr: 1 1   1   Injury with Fall? 1 1   1   Risk for fall due to : History of fall(s);Impaired balance/gait;Impaired mobility No Fall Risks     Follow up Education provided;Falls prevention discussed;Falls evaluation completed Falls evaluation completed   Falls prevention discussed    MEDICARE RISK AT HOME: Medicare Risk at Home Any stairs in or around the home?: Yes If so, are there any without handrails?: No Home free of loose throw rugs in walkways, pet beds, electrical cords, etc?: Yes Adequate lighting in your home to reduce risk of falls?: Yes Life alert?: No Use of a cane, walker or w/c?: No Grab bars in the bathroom?: Yes Shower chair or bench in shower?: No Elevated toilet seat or a handicapped toilet?: Yes  TIMED UP AND GO:  Was the test performed?  No    Cognitive Function:        05/15/2023    1:34 PM 05/13/2022    2:14 PM 05/12/2021    2:22 PM  6CIT Screen  What Year? 0 points 0 points 0 points  What  month? 0 points 0 points 0 points  What time? 0 points 0 points 0 points  Count back from 20 0 points 0 points 0 points  Months in reverse 0 points 0 points 0 points  Repeat phrase 0 points 0 points 0 points  Total Score 0 points 0 points 0 points    Immunizations Immunization History  Administered Date(s) Administered   Influenza, Seasonal, Injecte, Preservative Fre 04/21/2023   Influenza,inj,Quad PF,6+ Mos 12/30/2015, 02/23/2022   Influenza-Unspecified 04/24/2018   PFIZER(Purple Top)SARS-COV-2 Vaccination 09/10/2019, 10/02/2019   PPD Test 11/15/2019   Td 03/03/2008   Tdap 02/23/2022    TDAP status: Due, Education has been provided regarding the importance of this vaccine. Advised may receive this vaccine at local pharmacy or Health Dept. Aware to provide a copy of the vaccination record if obtained from local pharmacy or Health Dept. Verbalized acceptance and understanding.  Flu Vaccine status: Up to date  Pneumococcal vaccine status: Due, Education has been provided regarding the importance of this vaccine. Advised may receive this vaccine at local pharmacy or Health Dept. Aware to provide a copy of the vaccination record if obtained from local pharmacy or Health Dept. Verbalized acceptance and understanding.  Covid-19 vaccine status: Information provided on how to obtain  vaccines.   Qualifies for Shingles Vaccine? Yes   Zostavax completed No   Shingrix Completed?: No.    Education has been provided regarding the importance of this vaccine. Patient has been advised to call insurance company to determine out of pocket expense if they have not yet received this vaccine. Advised may also receive vaccine at local pharmacy or Health Dept. Verbalized acceptance and understanding.  Screening Tests Health Maintenance  Topic Date Due   Fecal DNA (Cologuard)  Never done   MAMMOGRAM  05/31/2022   COVID-19 Vaccine (3 - 2024-25 season) 12/25/2022   Zoster Vaccines- Shingrix (1 of 2)  07/20/2023 (Originally 08/09/2014)   Hepatitis C Screening  07/27/2023 (Originally 08/09/1982)   Cervical Cancer Screening (HPV/Pap Cotest)  04/20/2024 (Originally 12/24/2004)   HIV Screening  01/11/2049 (Originally 08/09/1979)   Medicare Annual Wellness (AWV)  05/14/2024   DTaP/Tdap/Td (3 - Td or Tdap) 02/24/2032   INFLUENZA VACCINE  Completed   HPV VACCINES  Aged Out    Health Maintenance  Health Maintenance Due  Topic Date Due   Fecal DNA (Cologuard)  Never done   MAMMOGRAM  05/31/2022   COVID-19 Vaccine (3 - 2024-25 season) 12/25/2022    Colorectal cancer screening:  Cologuard ordered at last visit 04/21/23; patient plans to complete soon   Mammogram status: Ordered today and scheduled. Pt provided with contact info and advised to call to schedule appt.   Lung Cancer Screening: (Low Dose CT Chest recommended if Age 70-80 years, 20 pack-year currently smoking OR have quit w/in 15years.) does not qualify.   Lung Cancer Screening Referral: n/a  Additional Screening:  Hepatitis C Screening: does qualify  Vision Screening: Recommended annual ophthalmology exams for early detection of glaucoma and other disorders of the eye. Is the patient up to date with their annual eye exam?  Yes  Who is the provider or what is the name of the office in which the patient attends annual eye exams? [facility] If pt is not established with a provider, would they like to be referred to a provider to establish care? No .   Dental Screening: Recommended annual dental exams for proper oral hygiene  Community Resource Referral / Chronic Care Management: CRR required this visit?  No   CCM required this visit?  No     Plan:     I have personally reviewed and noted the following in the patient's chart:   Medical and social history Use of alcohol, tobacco or illicit drugs  Current medications and supplements including opioid prescriptions. Patient is not currently taking opioid  prescriptions. Functional ability and status Nutritional status Physical activity Advanced directives List of other physicians Hospitalizations, surgeries, and ER visits in previous 12 months Vitals Screenings to include cognitive, depression, and falls Referrals and appointments  In addition, I have reviewed and discussed with patient certain preventive protocols, quality metrics, and best practice recommendations. A written personalized care plan for preventive services as well as general preventive health recommendations were provided to patient.     Kandis Fantasia [place], [place]   1/61/0960   After Visit Summary: (MyChart) Due to this being a telephonic visit, the after visit summary with patients personalized plan was offered to patient via MyChart   Nurse Notes: No concerns at this time

## 2023-05-15 NOTE — Patient Instructions (Signed)
Cassandra Mcconnell , Thank you for taking time to come for your Medicare Wellness Visit. I appreciate your ongoing commitment to your health goals. Please review the following plan we discussed and let me know if I can assist you in the future.   Referrals/Orders/Follow-Ups/Clinician Recommendations: Aim for 30 minutes of exercise or brisk walking, 6-8 glasses of water, and 5 servings of fruits and vegetables each day.  This is a list of the screening recommended for you and due dates:  Health Maintenance  Topic Date Due   Cologuard (Stool DNA test)  Never done   Mammogram  05/31/2022   COVID-19 Vaccine (3 - 2024-25 season) 12/25/2022   Zoster (Shingles) Vaccine (1 of 2) 07/20/2023*   Hepatitis C Screening  07/27/2023*   Pap with HPV screening  04/20/2024*   HIV Screening  01/11/2049*   Medicare Annual Wellness Visit  05/14/2024   DTaP/Tdap/Td vaccine (3 - Td or Tdap) 02/24/2032   Flu Shot  Completed   HPV Vaccine  Aged Out  *Topic was postponed. The date shown is not the original due date.    Advanced directives: (In Chart) A copy of your advanced directives are scanned into your chart should your provider ever need it.  Next Medicare Annual Wellness Visit scheduled for next year: Yes

## 2023-05-23 ENCOUNTER — Encounter: Payer: Self-pay | Admitting: Family Medicine

## 2023-05-23 LAB — COLOGUARD: COLOGUARD: NEGATIVE

## 2023-06-05 ENCOUNTER — Ambulatory Visit
Admission: RE | Admit: 2023-06-05 | Discharge: 2023-06-05 | Disposition: A | Discharge status: Home / Self Care | Payer: HMO | Source: Ambulatory Visit | Attending: Family Medicine | Admitting: Family Medicine

## 2023-06-05 DIAGNOSIS — Z1231 Encounter for screening mammogram for malignant neoplasm of breast: Secondary | ICD-10-CM | POA: Diagnosis not present

## 2023-06-08 ENCOUNTER — Encounter: Payer: Self-pay | Admitting: Family Medicine

## 2023-07-23 ENCOUNTER — Emergency Department (HOSPITAL_COMMUNITY)
Admission: EM | Admit: 2023-07-23 | Discharge: 2023-07-23 | Discharge status: Against Medical Advice | Payer: Worker's Compensation | Attending: Emergency Medicine | Admitting: Emergency Medicine

## 2023-07-23 ENCOUNTER — Emergency Department (HOSPITAL_COMMUNITY): Payer: Worker's Compensation

## 2023-07-23 ENCOUNTER — Encounter (HOSPITAL_COMMUNITY): Payer: Self-pay

## 2023-07-23 ENCOUNTER — Encounter: Payer: Self-pay | Admitting: Family Medicine

## 2023-07-23 ENCOUNTER — Other Ambulatory Visit: Payer: Self-pay

## 2023-07-23 DIAGNOSIS — S82001A Unspecified fracture of right patella, initial encounter for closed fracture: Secondary | ICD-10-CM | POA: Diagnosis not present

## 2023-07-23 DIAGNOSIS — Z5329 Procedure and treatment not carried out because of patient's decision for other reasons: Secondary | ICD-10-CM | POA: Diagnosis not present

## 2023-07-23 DIAGNOSIS — Y92480 Sidewalk as the place of occurrence of the external cause: Secondary | ICD-10-CM | POA: Insufficient documentation

## 2023-07-23 DIAGNOSIS — W010XXA Fall on same level from slipping, tripping and stumbling without subsequent striking against object, initial encounter: Secondary | ICD-10-CM | POA: Insufficient documentation

## 2023-07-23 DIAGNOSIS — M25561 Pain in right knee: Secondary | ICD-10-CM | POA: Diagnosis not present

## 2023-07-23 DIAGNOSIS — W19XXXA Unspecified fall, initial encounter: Secondary | ICD-10-CM

## 2023-07-23 DIAGNOSIS — S82091A Other fracture of right patella, initial encounter for closed fracture: Secondary | ICD-10-CM | POA: Diagnosis not present

## 2023-07-23 DIAGNOSIS — M25422 Effusion, left elbow: Secondary | ICD-10-CM | POA: Diagnosis not present

## 2023-07-23 DIAGNOSIS — M25522 Pain in left elbow: Secondary | ICD-10-CM | POA: Insufficient documentation

## 2023-07-23 NOTE — ED Triage Notes (Signed)
 Pt presents with fall after tripping over sidewalk tonight. Pt is now c/o right knee pain and left elbow pain. Pt did not hit her head. No LOC. Able to put some weight on knee but states her left arm is numb from elbow down.

## 2023-07-24 ENCOUNTER — Ambulatory Visit: Payer: Self-pay

## 2023-07-24 ENCOUNTER — Encounter (HOSPITAL_COMMUNITY): Payer: Self-pay

## 2023-07-24 ENCOUNTER — Emergency Department (HOSPITAL_COMMUNITY)
Admission: EM | Admit: 2023-07-24 | Discharge: 2023-07-24 | Disposition: A | Discharge status: Home / Self Care | Payer: Worker's Compensation | Attending: Emergency Medicine | Admitting: Emergency Medicine

## 2023-07-24 ENCOUNTER — Encounter: Payer: Self-pay | Admitting: Nurse Practitioner

## 2023-07-24 ENCOUNTER — Other Ambulatory Visit: Payer: Self-pay

## 2023-07-24 ENCOUNTER — Telehealth (INDEPENDENT_AMBULATORY_CARE_PROVIDER_SITE_OTHER): Admitting: Nurse Practitioner

## 2023-07-24 DIAGNOSIS — S42402A Unspecified fracture of lower end of left humerus, initial encounter for closed fracture: Secondary | ICD-10-CM | POA: Diagnosis not present

## 2023-07-24 DIAGNOSIS — S42401D Unspecified fracture of lower end of right humerus, subsequent encounter for fracture with routine healing: Secondary | ICD-10-CM

## 2023-07-24 DIAGNOSIS — S82034A Nondisplaced transverse fracture of right patella, initial encounter for closed fracture: Secondary | ICD-10-CM | POA: Diagnosis not present

## 2023-07-24 DIAGNOSIS — Y990 Civilian activity done for income or pay: Secondary | ICD-10-CM | POA: Insufficient documentation

## 2023-07-24 DIAGNOSIS — M25522 Pain in left elbow: Secondary | ICD-10-CM | POA: Diagnosis present

## 2023-07-24 DIAGNOSIS — S82001D Unspecified fracture of right patella, subsequent encounter for closed fracture with routine healing: Secondary | ICD-10-CM | POA: Diagnosis not present

## 2023-07-24 DIAGNOSIS — R030 Elevated blood-pressure reading, without diagnosis of hypertension: Secondary | ICD-10-CM | POA: Diagnosis not present

## 2023-07-24 DIAGNOSIS — S52002A Unspecified fracture of upper end of left ulna, initial encounter for closed fracture: Secondary | ICD-10-CM | POA: Diagnosis not present

## 2023-07-24 DIAGNOSIS — W01198A Fall on same level from slipping, tripping and stumbling with subsequent striking against other object, initial encounter: Secondary | ICD-10-CM | POA: Insufficient documentation

## 2023-07-24 DIAGNOSIS — S82001A Unspecified fracture of right patella, initial encounter for closed fracture: Secondary | ICD-10-CM | POA: Diagnosis not present

## 2023-07-24 MED ORDER — IBUPROFEN 800 MG PO TABS: 800.0000 mg | ORAL_TABLET | Freq: Three times a day (TID) | ORAL | 0 refills | Status: DC | PRN

## 2023-07-24 MED ORDER — TRAMADOL HCL 50 MG PO TABS: 50.0000 mg | ORAL_TABLET | Freq: Three times a day (TID) | ORAL | 0 refills | Status: DC | PRN

## 2023-07-24 NOTE — ED Provider Notes (Signed)
 [place] EMERGENCY DEPARTMENT AT [facility] Provider Note   CSN: [id number] Arrival date & time: 07/23/23  6962     History  Chief Complaint  Patient presents with   Cassandra Mcconnell is a 58 y.o. female.  Patient states that she fell onto her left elbow and right knee.  The history is provided by the patient and medical records. No language interpreter was used.  Fall This is a new problem. The current episode started 6 to 12 hours ago. The problem occurs rarely. The problem has been resolved. Pertinent negatives include no chest pain, no abdominal pain and no headaches. Exacerbated by: Movement. Nothing relieves the symptoms.       Home Medications Prior to Admission medications   Medication Sig Start Date End Date Taking? Authorizing Provider  acetaminophen (TYLENOL) 500 MG tablet Take 1,000 mg by mouth every 6 (six) hours as needed. For pain     [provider]  atorvastatin (LIPITOR) 40 MG tablet Take 1 tablet (40 mg total) by mouth daily. 07/28/22   [name], FNP  citalopram (CELEXA) 40 MG tablet Take 40 mg by mouth daily. 04/17/23   [provider]  desvenlafaxine (PRISTIQ) 100 MG 24 hr tablet Take 100 mg by mouth at bedtime. 10/24/22   [provider]  ibuprofen (ADVIL) 600 MG tablet Take 1 tablet (600 mg total) by mouth every 8 (eight) hours as needed. 04/21/23   [name], FNP  Olopatadine HCl 0.2 % SOLN Place 1 drop into both eyes daily as needed (allergies).    [provider]  promethazine (PHENERGAN) 25 MG tablet Take 1 tablet (25 mg total) by mouth every 8 (eight) hours as needed for nausea or vomiting. 11/16/22   Rakes, Doralee Albino, FNP  Propylene Glycol (SYSTANE BALANCE) 0.6 % SOLN Apply 1 drop to eye daily as needed (dry eyes).    [provider]  SEMAGLUTIDE-WEIGHT MANAGEMENT [place] Inject 2 mg into the skin once a week. Compounded pharmacy    [provider]      Allergies    Vistaril  [hydroxyzine], Zofran [ondansetron hcl], Azithromycin, Conjugated estrogens, Cyclobenzaprine hcl, Hydrocodone, Misc. sulfonamide containing compounds, Norco [hydrocodone-acetaminophen], Nsaids, Oxycontin [oxycodone], Percocet [oxycodone-acetaminophen], Robaxin [methocarbamol], Rosuvastatin, Sulfa antibiotics, Wellbutrin [bupropion hcl], Ciprofloxacin, and Dicyclomine hcl    Review of Systems   Review of Systems  Constitutional:  Negative for appetite change and fatigue.  HENT:  Negative for congestion, ear discharge and sinus pressure.   Eyes:  Negative for discharge.  Respiratory:  Negative for cough.   Cardiovascular:  Negative for chest pain.  Gastrointestinal:  Negative for abdominal pain and diarrhea.  Genitourinary:  Negative for frequency and hematuria.  Musculoskeletal:  Negative for back pain.       Pain in left elbow and right knee  Skin:  Negative for rash.  Neurological:  Negative for seizures and headaches.  Psychiatric/Behavioral:  Negative for hallucinations.     Physical Exam Updated Vital Signs BP 138/72   Pulse 71   Temp (!) 97.5 F (36.4 C)   Resp 18   LMP 02/10/2015 Comment: had BSO 2016  SpO2 97%  Physical Exam Vitals and nursing note reviewed.  Constitutional:      Appearance: She is well-developed.  HENT:     Head: Normocephalic.     Nose: Nose normal.  Eyes:     General: No scleral icterus.    Conjunctiva/sclera: Conjunctivae normal.  Neck:  Thyroid: No thyromegaly.  Cardiovascular:     Rate and Rhythm: Normal rate and regular rhythm.     Heart sounds: No murmur heard.    No friction rub. No gallop.  Pulmonary:     Breath sounds: No stridor. No wheezing or rales.  Chest:     Chest wall: No tenderness.  Abdominal:     General: There is no distension.     Tenderness: There is no abdominal tenderness. There is no rebound.  Musculoskeletal:     Cervical back: Neck supple.     Comments: Tenderness to left elbow and right knee with abrasions  right knee  Lymphadenopathy:     Cervical: No cervical adenopathy.  Skin:    Findings: No erythema or rash.  Neurological:     Mental Status: She is alert and oriented to person, place, and time.     Motor: No abnormal muscle tone.     Coordination: Coordination normal.  Psychiatric:        Behavior: Behavior normal.     ED Results / Procedures / Treatments   Labs (all labs ordered are listed, but only abnormal results are displayed) Labs Reviewed - No data to display  EKG None  Radiology DG Elbow Complete Left Result Date: 07/23/2023 CLINICAL DATA:  Left elbow pain after fall EXAM: LEFT ELBOW - COMPLETE 3+ VIEW COMPARISON:  11/16/2017 FINDINGS: Large elbow joint effusion. Suspected nondisplaced fracture of the radial head. Small osseous fragments adjacent to the lateral epicondyle suspicious for additional fracture. Soft tissues are unremarkable. IMPRESSION: Suspected nondisplaced fracture of the radial head. Small osseous fragments adjacent to the lateral epicondyle suspicious for additional fracture. Recommend CT for confirmation. Electronically Signed   By: Minerva Fester M.D.   On: 07/23/2023 20:20   DG Knee Complete 4 Views Right Result Date: 07/23/2023 CLINICAL DATA:  Fall EXAM: RIGHT KNEE - COMPLETE 4+ VIEW COMPARISON:  None Available. FINDINGS: There is a fracture through the inferior pole of the right patella, nondisplaced. Slight overlying soft tissue swelling. No additional fracture, subluxation or dislocation. No joint effusion. IMPRESSION: Nondisplaced fracture through the inferior pole of the right patella. Electronically Signed   By: Charlett Nose M.D.   On: 07/23/2023 20:15    Procedures Procedures    Medications Ordered in ED Medications - No data to display  ED Course/ Medical Decision Making/ A&P                                 Medical Decision Making Amount and/or Complexity of Data Reviewed Radiology: ordered.   Patient with a fall and patella  fracture to right knee and left elbow with possible radial head fracture.  Patient left the emergency department before I was able to discuss results of the x-rays and plan for treatment        Final Clinical Impression(s) / ED Diagnoses Final diagnoses:  None    Rx / DC Orders ED Discharge Orders     None         Bethann Berkshire, MD 07/24/23 1026

## 2023-07-24 NOTE — ED Triage Notes (Addendum)
 Pt states she came back because she has a broken right knee & a break in left arm. Was seen earlier & left because she was having to wait too long.

## 2023-07-24 NOTE — Progress Notes (Signed)
 Virtual Visit Consent   Cassandra Mcconnell, you are scheduled for a virtual visit with Cassandra Daphine Deutscher, FNP, a [facility] provider, today.     Just as with appointments in the office, your consent must be obtained to participate.  Your consent will be active for this visit and any virtual visit you may have with one of our providers in the next 365 days.     If you have a MyChart account, a copy of this consent can be sent to you electronically.  All virtual visits are billed to your insurance company just like a traditional visit in the office.    As this is a virtual visit, video technology does not allow for your provider to perform a traditional examination.  This may limit your provider's ability to fully assess your condition.  If your provider identifies any concerns that need to be evaluated in person or the need to arrange testing (such as labs, EKG, etc.), we will make arrangements to do so.     Although advances in technology are sophisticated, we cannot ensure that it will always work on either your end or our end.  If the connection with a video visit is poor, the visit may have to be switched to a telephone visit.  With either a video or telephone visit, we are not always able to ensure that we have a secure connection.     I need to obtain your verbal consent now.   Are you willing to proceed with your visit today? YES   SHANITA KANAN has provided verbal consent on 07/24/2023 for a virtual visit (video or telephone).   Cassandra Daphine Deutscher, FNP   Date: 07/24/2023 11:33 AM   Virtual Visit via Video Note   I, Cassandra Mcconnell, connected with Cassandra Mcconnell ([id number], 18-Nov-1965) on 07/24/23 at 11:20 AM EDT by a video-enabled telemedicine application and verified that I am speaking with the correct person using two identifiers.  Location: Patient: Virtual Visit Location Patient: Home Provider: Virtual Visit Location Provider: Mobile   I discussed the limitations of  evaluation and management by telemedicine and the availability of in person appointments. The patient expressed understanding and agreed to proceed.    History of Present Illness: Cassandra Mcconnell is a 58 y.o. who identifies as a female who was assigned female at birth, and is being seen today for elbow pain.  HPI: Patient fell yesterday and broke her elbow and knee cap. The ED gave her no pain medication. They told her to get pain meds form PCP until can see ortho tomorrow. She is in a cast left arm  And brace right knee.    Review of Systems  Musculoskeletal:  Positive for joint pain (left elbow and right knee pain).    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   Cervical radiculopathy 04/05/2018   Neurogenic claudication 04/05/2018   Allergic rhinitis 03/14/2017   Depression, recurrent (HCC) 05/31/2013   Morbid obesity (HCC) 12/04/2009   GAD (generalized anxiety disorder) 11/10/2006   GERD 11/10/2006   HYPERCHOLESTEROLEMIA, PURE 11/09/2006   Primary hypertension 11/09/2006   CHOLECYSTECTOMY, HX OF 11/09/2006    Allergies:  Allergies  Allergen Reactions  Vistaril [Hydroxyzine] Shortness Of Breath    Dizzy, light headed    Zofran [Ondansetron Hcl] Shortness Of Breath and Rash    Numbness   Azithromycin Swelling   Conjugated Estrogens Other (See Comments)    emotional   Cyclobenzaprine Hcl     REACTION: disorientation   Hydrocodone     Other reaction(s): Unknown   Misc. Sulfonamide Containing Compounds Other (See Comments)   Norco [Hydrocodone-Acetaminophen] Other (See Comments)    Felt like she was going through withdrawal   Nsaids Other (See Comments)    Cannot have because  of bariatric surgery   Oxycontin [Oxycodone]     Withdrawal feeling    Percocet [Oxycodone-Acetaminophen]     Hypotension    Robaxin [Methocarbamol]     Pt does not remember reaction    Rosuvastatin Dermatitis   Sulfa Antibiotics Hives and Itching   Wellbutrin [Bupropion Hcl] Other (See Comments)    Emotional, makes depression worse   Ciprofloxacin Itching and Rash   Dicyclomine Hcl Itching and Rash   Medications:  Current Outpatient Medications:    acetaminophen (TYLENOL) 500 MG tablet, Take 1,000 mg by mouth every 6 (six) hours as needed. For pain , Disp: , Rfl:    atorvastatin (LIPITOR) 40 MG tablet, Take 1 tablet (40 mg total) by mouth daily., Disp: 90 tablet, Rfl: 3   citalopram (CELEXA) 40 MG tablet, Take 40 mg by mouth daily., Disp: , Rfl:    desvenlafaxine (PRISTIQ) 100 MG 24 hr tablet, Take 100 mg by mouth at bedtime., Disp: , Rfl:    ibuprofen (ADVIL) 600 MG tablet, Take 1 tablet (600 mg total) by mouth every 8 (eight) hours as needed., Disp: 30 tablet, Rfl: 3   Olopatadine HCl 0.2 % SOLN, Place 1 drop into both eyes daily as needed (allergies)., Disp: , Rfl:    promethazine (PHENERGAN) 25 MG tablet, Take 1 tablet (25 mg total) by mouth every 8 (eight) hours as needed for nausea or vomiting., Disp: 20 tablet, Rfl: 0   Propylene Glycol (SYSTANE BALANCE) 0.6 % SOLN, Apply 1 drop to eye daily as needed (dry eyes)., Disp: , Rfl:    SEMAGLUTIDE-WEIGHT MANAGEMENT Portal, Inject 2 mg into the skin once a week. Compounded pharmacy, Disp: , Rfl:   Observations/Objective: Patient is well-developed, well-nourished in no acute distress.  Resting comfortably  at home.  Head is normocephalic, atraumatic.  No labored breathing.  Speech is clear and coherent with logical content.  Patient is alert and oriented at baseline.  Left elbow is in a cast Right knee edema.  Assessment and Plan:  [name] in today with chief complaint of No chief complaint on file.   1. Closed fracture  dislocation of right elbow with routine healing, subsequent encounter (Primary) 2. Closed nondisplaced fracture of right patella with routine healing, unspecified fracture morphology, subsequent encounter Keep appt with ortho Motrin as prescribed Rest arm on pillow Elevate right leg while sitting  Meds ordered this encounter  Medications   DISCONTD: traMADol (ULTRAM) 50 MG tablet    Sig: Take 1 tablet (50 mg total) by mouth every 8 (eight) hours as needed for up to 5 days.    Dispense:  15 tablet    Refill:  0    Supervising Provider:   Arville Care A [[id number]]   ibuprofen (ADVIL) 800 MG tablet    Sig: Take 1 tablet (800 mg total) by mouth every 8 (eight) hours as needed.    Dispense:  30 tablet  Refill:  0    Do not fill ultram- allergic    Supervising Provider:   Arville Care A [[id number]]      Follow Up Instructions: I discussed the assessment and treatment plan with the patient. The patient was provided an opportunity to ask questions and all were answered. The patient agreed with the plan and demonstrated an understanding of the instructions.  A copy of instructions were sent to the patient via MyChart.  The patient was advised to call back or seek an in-person evaluation if the symptoms worsen or if the condition fails to improve as anticipated.  Time:  I spent 9 minutes with the patient via telehealth technology discussing the above problems/concerns.    Cassandra Daphine Deutscher, FNP

## 2023-07-24 NOTE — Telephone Encounter (Signed)
 noted

## 2023-07-24 NOTE — Discharge Instructions (Signed)
 Wear splint until followed up by orthopedics.  The contact information for Ortho care in [place] has been provided in this discharge summary for you to call and make these arrangements.  Ice for 20 minutes every 2 hours while awake for the next 2 days.  Follow-up with your primary doctor this morning regarding pain medication.

## 2023-07-24 NOTE — Telephone Encounter (Signed)
 Chief Complaint: Elbow Pain/knee pain Symptoms: Fracture to left elbow, fracture to right kneecap Frequency: occurred yesterday Pertinent Negatives: Patient denies CP, SOB Disposition: [] ED /[] Urgent Care (no appt availability in office) / [] Appointment(In office/virtual)/ []  [facility]/ [] Home Care/ [] Refused Recommended Disposition /[] [facility]/ [x]  Follow-up with PCP Additional Notes: patient reports having gone to the ED yesterday for left elbow and right knee pain. 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 Larey Seat on concrete and attempted to break her fall. Patient was seen at [facility] yesterday. 2 fractures to her left elbow and fracture to right kneecap. Patient states she wasn't given any pain medication while she was in the ED nor was anything sent to the pharmacy for her. Patient is requesting a few days of pain medication sent to the pharmacy for her. Patient's left arm is splinted and right leg is in an immobilizer. Patient is requesting a phone call from staff. Patient verbalized understanding of plan and all questions answered.    Copied from CRM [id number]. Topic: Clinical - Red Word Triage >> Jul 24, 2023  9:05 AM Gildardo Pounds wrote: Red Word that prompted transfer to Nurse Triage: Patient fell and broke left elbow in 2 places and right knee cap. Patient not given anything for pain at [facility] ED last night. Callback number is [phone] Reason for Disposition  [1] SEVERE pain (e.g., excruciating, unable to walk) AND [2] not improved after 2 hours of pain medicine  [1] SEVERE pain AND [2] not improved 2 hours after pain medicine  Answer Assessment - Initial Assessment Questions 1. ONSET: "When did the pain start?"     Patient broke left elbow s/p fall  2. LOCATION: "Where is the pain located?"     Left elbow 3. PAIN: "How bad is the pain?" (Scale 1-10; or mild, moderate, severe)   - MILD (1-3): doesn't interfere with normal  activities.   - MODERATE (4-7): interferes with normal activities (e.g., work or school) or awakens from sleep.   - SEVERE (8-10): excruciating pain, unable to do any normal activities, unable to use arm at all.     10 4. 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 CAUSE: "What do you think is causing the elbow pain?"      Fell on sidewalk 6. OTHER SYMPTOMS: "Do you have any other symptoms?" (e.g., neck pain, elbow swelling, rash, fever)     no  Answer Assessment - Initial Assessment Questions 1. LOCATION and RADIATION: "Where is the pain located?"      Right knee pain 2. QUALITY: "What does the pain feel like?"  (e.g., sharp, dull, aching, burning)     throbbing 3. SEVERITY: "How bad is the pain?" "What does it keep you from doing?"   (Scale 1-10; or mild, moderate, severe)   -  MILD (1-3): doesn't interfere with normal activities    -  MODERATE (4-7): interferes with normal activities (e.g., work or school) or awakens from sleep, limping    -  SEVERE (8-10): excruciating pain, unable to do any normal activities, unable to walk     10 out of 10 4. ONSET: "When did the pain start?" "Does it come and go, or is it there all the time?"     Started yesterday-constant 5. RECURRENT: "Have you had this pain before?"  If Yes, ask: "When, and what happened then?"     no 6. 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 AGGRAVATING FACTORS: "What makes the knee pain worse?" (e.g., walking, climbing stairs, running)     movement 8. ASSOCIATED SYMPTOMS: "Is there any swelling or redness of the knee?"     swelling 9. OTHER SYMPTOMS: "Do you have any other symptoms?" (e.g., chest pain, difficulty breathing, fever, calf pain)     no  Protocols used: Elbow  Pain-A-AH, Knee Pain-A-AH

## 2023-07-24 NOTE — ED Provider Notes (Signed)
 Cassandra Mcconnell EMERGENCY DEPARTMENT AT [facility] Provider Note   CSN: [id number] Arrival date & time: 07/24/23  9562     History  No chief complaint on file.   Cassandra Mcconnell is a 58 y.o. female.  Patient is a 58 year old female presenting with complaints of left elbow and right knee pain.  She reports she was at work and was walking on an uneven sidewalk when she tripped and fell.  She was seen here earlier this evening, had x-rays, then left prior to being seen due to prolonged wait time.       Home Medications Prior to Admission medications   Medication Sig Start Date End Date Taking? Authorizing Provider  acetaminophen (TYLENOL) 500 MG tablet Take 1,000 mg by mouth every 6 (six) hours as needed. For pain     [provider]  atorvastatin (LIPITOR) 40 MG tablet Take 1 tablet (40 mg total) by mouth daily. 07/28/22   [name], FNP  citalopram (CELEXA) 40 MG tablet Take 40 mg by mouth daily. 04/17/23   [provider]  desvenlafaxine (PRISTIQ) 100 MG 24 hr tablet Take 100 mg by mouth at bedtime. 10/24/22   [provider]  ibuprofen (ADVIL) 600 MG tablet Take 1 tablet (600 mg total) by mouth every 8 (eight) hours as needed. 04/21/23   [name], FNP  Olopatadine HCl 0.2 % SOLN Place 1 drop into both eyes daily as needed (allergies).    [provider]  promethazine (PHENERGAN) 25 MG tablet Take 1 tablet (25 mg total) by mouth every 8 (eight) hours as needed for nausea or vomiting. 11/16/22   Rakes, Doralee Albino, FNP  Propylene Glycol (SYSTANE BALANCE) 0.6 % SOLN Apply 1 drop to eye daily as needed (dry eyes).    [provider]  SEMAGLUTIDE-WEIGHT MANAGEMENT Comfort Inject 2 mg into the skin once a week. Compounded pharmacy    [provider]      Allergies    Vistaril [hydroxyzine], Zofran [ondansetron hcl], Azithromycin, Conjugated estrogens, Cyclobenzaprine hcl, Hydrocodone, Misc. sulfonamide containing compounds, Norco  [hydrocodone-acetaminophen], Nsaids, Oxycontin [oxycodone], Percocet [oxycodone-acetaminophen], Robaxin [methocarbamol], Rosuvastatin, Sulfa antibiotics, Wellbutrin [bupropion hcl], Ciprofloxacin, and Dicyclomine hcl    Review of Systems   Review of Systems  All other systems reviewed and are negative.   Physical Exam Updated Vital Signs LMP 02/10/2015 Comment: had BSO 2016 Physical Exam Vitals and nursing note reviewed.  Constitutional:      Appearance: Normal appearance.  Pulmonary:     Effort: Pulmonary effort is normal.  Musculoskeletal:     Comments: RRR abrasions noted to the right knee with perhaps a small effusion, but no other deformity.  DP pulses are palpable and motor and sensation are intact throughout the entire leg.  There is tenderness over the lateral aspect of the right elbow, but no significant swelling or obvious deformity.  Ulnar and radial pulses are palpable and motor and sensation are intact throughout the entire hand.  Skin:    General: Skin is warm and dry.  Neurological:     Mental Status: She is alert.     ED Results / Procedures / Treatments   Labs (all labs ordered are listed, but only abnormal results are displayed) Labs Reviewed - No data to display  EKG None  Radiology DG Elbow Complete Left Result Date: 07/23/2023 CLINICAL DATA:  Left elbow pain after fall EXAM: LEFT ELBOW - COMPLETE 3+ VIEW COMPARISON:  11/16/2017 FINDINGS: Large elbow joint effusion. Suspected  nondisplaced fracture of the radial head. Small osseous fragments adjacent to the lateral epicondyle suspicious for additional fracture. Soft tissues are unremarkable. IMPRESSION: Suspected nondisplaced fracture of the radial head. Small osseous fragments adjacent to the lateral epicondyle suspicious for additional fracture. Recommend CT for confirmation. Electronically Signed   By: Minerva Fester M.D.   On: 07/23/2023 20:20   DG Knee Complete 4 Views Right Result Date:  07/23/2023 CLINICAL DATA:  Fall EXAM: RIGHT KNEE - COMPLETE 4+ VIEW COMPARISON:  None Available. FINDINGS: There is a fracture through the inferior pole of the right patella, nondisplaced. Slight overlying soft tissue swelling. No additional fracture, subluxation or dislocation. No joint effusion. IMPRESSION: Nondisplaced fracture through the inferior pole of the right patella. Electronically Signed   By: Charlett Nose M.D.   On: 07/23/2023 20:15    Procedures Procedures    Medications Ordered in ED Medications - No data to display  ED Course/ Medical Decision Making/ A&P  X-rays of the right knee reveal a patellar fracture.  X-rays of the left elbow show a radial head fracture.  Patient will be placed in a knee immobilizer and long-arm splint.  She is to follow-up with orthopedics in the next week.  Final Clinical Impression(s) / ED Diagnoses Final diagnoses:  None    Rx / DC Orders ED Discharge Orders     None         Geoffery Lyons, MD 07/24/23 [phone]

## 2023-07-24 NOTE — ED Notes (Signed)
2024
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
## 2023-07-24 NOTE — Telephone Encounter (Signed)
 Lmtcb schedule an apt with Kari Baars. No rx will be called in without an apt.

## 2023-07-25 ENCOUNTER — Encounter: Payer: Self-pay | Admitting: Physician Assistant

## 2023-07-25 ENCOUNTER — Ambulatory Visit (INDEPENDENT_AMBULATORY_CARE_PROVIDER_SITE_OTHER): Payer: Worker's Compensation | Admitting: Physician Assistant

## 2023-07-25 ENCOUNTER — Other Ambulatory Visit (INDEPENDENT_AMBULATORY_CARE_PROVIDER_SITE_OTHER): Payer: Worker's Compensation

## 2023-07-25 DIAGNOSIS — M25522 Pain in left elbow: Secondary | ICD-10-CM | POA: Diagnosis not present

## 2023-07-25 DIAGNOSIS — M25531 Pain in right wrist: Secondary | ICD-10-CM | POA: Diagnosis not present

## 2023-07-25 DIAGNOSIS — S82032G Displaced transverse fracture of left patella, subsequent encounter for closed fracture with delayed healing: Secondary | ICD-10-CM

## 2023-07-25 NOTE — Progress Notes (Signed)
 Office Visit Note   Patient: Cassandra Mcconnell           Date of Birth: Dec 09, 1965           MRN: [id number] Visit Date: 07/25/2023              Requested by: Cassandra Masters, FNP [address] [place],  [place] [address] PCP: Cassandra Masters, FNP   Assessment & Plan: Visit Diagnoses:  1. Pain in right wrist   2. Pain in left elbow   3. Displaced transverse fracture of left patella, subsequent encounter for closed fracture with delayed healing     Plan: With regards to her wrist no fractures evident she can treat this symptomatically.  With regards to her right leg she does have a nondisplaced inferior patella pole fracture.  She is able to do a straight leg raise that is just limited by pain.  She will return in 2 weeks she will have repeat x-rays and a repeat examination I did review her elbow x-rays with Cassandra Mcconnell she does have a large effusion.  She also has quite a bit of swelling and tenderness over the lateral epicondyle.  She has some osseous lesions on the lateral epicondyle that were not present on x-rays in 2019 as recommended by radiology we feel is important to go forward with a CT scan to be sure this is not a surgical fracture  Follow-Up Instructions: No follow-ups on file.   Orders:  Orders Placed This Encounter  Procedures   XR Wrist Complete Right   CT ELBOW LEFT WO CONTRAST   No orders of the defined types were placed in this encounter.     Procedures: No procedures performed   Clinical Data: No additional findings.   Subjective: No chief complaint on file.   HPI Cassandra Mcconnell is a pleasant 58 year old woman who is 2 days status post falling off a curb and onto her hands and landing on her knee.  She was a seen and evaluated and diagnosed with a nondisplaced inferior pole patella fracture.  She has been in a knee immobilizer on the right.  She was also diagnosed with possibly a nondisplaced radial head fracture but also all osseous fragments adjacent to the lateral  epicondyle concerning for fracture she also complains of right wrist pain  Review of Systems  All other systems reviewed and are negative.    Objective: Vital Signs: LMP 02/10/2015 Comment: had BSO 2016  Physical Exam Constitutional:      Appearance: Normal appearance.  Pulmonary:     Effort: Pulmonary effort is normal.  Skin:    General: Skin is warm and dry.  Neurological:     General: No focal deficit present.     Mental Status: She is alert and oriented to person, place, and time.     Ortho Exam Examination of her right knee she does have healing abrasions without any cellulitis.  She is exquisitely tender over the inferior pole of the patella and over the patella tendon which I believe is intact she is able to do a straight leg raise quadriceps mechanism is intact compartments are soft and nontender Examination of her left elbow she has moderate soft tissue swelling she has no tenderness over the triceps no tenderness over the biceps no tenderness over the medial condyle or the olecranon.  She is focally tender over the lateral epicondyle and over the radial head Specialty Comments:  No specialty comments available.  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   Cervical radiculopathy 04/05/2018   Neurogenic claudication 04/05/2018   Allergic rhinitis 03/14/2017   Depression, recurrent (HCC)  05/31/2013   Morbid obesity (HCC) 12/04/2009   GAD (generalized anxiety disorder) 11/10/2006   GERD 11/10/2006   HYPERCHOLESTEROLEMIA, PURE 11/09/2006   Primary hypertension 11/09/2006   CHOLECYSTECTOMY, HX OF 11/09/2006   Past Medical History:  Diagnosis Date   Ankle fracture    left   Anxiety    Arthritis    Back pain    Bell's palsy    Left side, 2009, 2017   Bell's palsy    Depression    Diverticulitis    Dyspnea    GERD (gastroesophageal reflux disease)    Headache    History of hiatal hernia    Hyperlipidemia    Hypertension    Postoperative nausea 04/21/2016   Toe fracture    left great toe   Wears glasses     Family History  Problem Relation Age of Onset   COPD Mother    Hypertension Mother    GER disease Mother    Heart attack Father    Prostate cancer Father    Cancer Paternal Uncle        prostate and bone   Cancer Paternal Uncle        prostate and bone   Heart attack Paternal Grandmother    Heart attack Paternal Grandfather    Stroke Paternal Grandfather    Hyperlipidemia Brother    Hypertension Brother    Diabetes Brother    Neuropathy Brother    Hyperlipidemia Brother    Hypertension Brother    Colon cancer Neg Hx    Stomach cancer Neg Hx    Breast cancer Neg Hx     Past Surgical History:  Procedure Laterality Date   BACK SURGERY  2004   l4/5 fusion   BREAST REDUCTION SURGERY Bilateral 08/08/2014   Procedure: BILATERAL BREAST REDUCTION  (BREAST);  Surgeon: Cassandra Fellows, MD;  Location: [facility];  Service: Plastics;  Laterality: Bilateral;   CATARACT EXTRACTION Bilateral    CERVICAL DISCECTOMY  12/12/2018   3 level    CHOLECYSTECTOMY     1998   INJECTION KNEE Left    KNEE ARTHROSCOPY     LAPAROSCOPIC BILATERAL SALPINGO OOPHERECTOMY Bilateral 03/11/2015   Procedure: LAPAROSCOPIC BILATERAL SALPINGO OOPHORECTOMY;  Surgeon: Cassandra Arms, MD;  Location: [facility];  Service: Gynecology;  Laterality: Bilateral;    LAPAROSCOPIC GASTRIC SLEEVE RESECTION WITH HIATAL HERNIA REPAIR N/A 03/22/2016   Procedure: LAPAROSCOPIC GASTRIC SLEEVE RESECTION WITH HIATAL HERNIA REPAIR WITHUPPER ENDOSCOPY;  Surgeon: Cassandra Kin, MD;  Location: [facility];  Service: General;  Laterality: N/A;   REDUCTION MAMMAPLASTY Bilateral    SEPTOPLASTY  1988   tonsillectomy  1991   TONSILLECTOMY     Social History   Occupational History   Occupation: stay at home mom  Tobacco Use  Smoking status: Never   Smokeless tobacco: Never  Vaping Use   Vaping status: Never Used  Substance and Sexual Activity   Alcohol use: No   Drug use: No   Sexual activity: Not Currently    Partners: Male    Birth control/protection: Post-menopausal, Surgical    Comment: BSO 2016

## 2023-07-25 NOTE — Telephone Encounter (Signed)
 Called pt to schedule appt for today, pt stated she had appt with Ortho doctor already.

## 2023-07-26 ENCOUNTER — Telehealth: Payer: Self-pay | Admitting: Physician Assistant

## 2023-07-26 NOTE — Telephone Encounter (Signed)
 Patient called and said that her insurance doesn't need authorization for CT scan. She is ready to get scheduled. CB#[phone]

## 2023-07-26 NOTE — Telephone Encounter (Signed)
 I called the patient, advising her that I have emailed [facility] scheduling just now - they will be contacting her

## 2023-07-28 ENCOUNTER — Ambulatory Visit (HOSPITAL_COMMUNITY)

## 2023-07-29 ENCOUNTER — Ambulatory Visit (HOSPITAL_BASED_OUTPATIENT_CLINIC_OR_DEPARTMENT_OTHER)
Admission: RE | Admit: 2023-07-29 | Discharge: 2023-07-29 | Disposition: A | Discharge status: Home / Self Care | Payer: Worker's Compensation | Source: Ambulatory Visit | Attending: Physician Assistant | Admitting: Physician Assistant

## 2023-07-29 DIAGNOSIS — S82032G Displaced transverse fracture of left patella, subsequent encounter for closed fracture with delayed healing: Secondary | ICD-10-CM | POA: Insufficient documentation

## 2023-07-29 DIAGNOSIS — M25422 Effusion, left elbow: Secondary | ICD-10-CM | POA: Diagnosis not present

## 2023-07-29 DIAGNOSIS — M25522 Pain in left elbow: Secondary | ICD-10-CM | POA: Insufficient documentation

## 2023-07-29 DIAGNOSIS — G8929 Other chronic pain: Secondary | ICD-10-CM | POA: Diagnosis not present

## 2023-08-02 DIAGNOSIS — M25522 Pain in left elbow: Secondary | ICD-10-CM

## 2023-08-03 ENCOUNTER — Other Ambulatory Visit: Payer: Self-pay | Admitting: Physician Assistant

## 2023-08-03 MED ORDER — TRAMADOL HCL 50 MG PO TABS: 50.0000 mg | ORAL_TABLET | Freq: Four times a day (QID) | ORAL | 0 refills | Status: DC | PRN

## 2023-08-04 ENCOUNTER — Telehealth: Payer: Self-pay

## 2023-08-04 ENCOUNTER — Encounter: Payer: Self-pay | Admitting: Physician Assistant

## 2023-08-04 ENCOUNTER — Ambulatory Visit: Admitting: Physician Assistant

## 2023-08-04 NOTE — Telephone Encounter (Signed)
 Called Dr Carlos Levering office at [facility] and spoke to the receptionist and she said that workers comp would have to contact them to schedule and set up the appointment  she is calling the patient to let her know the process .

## 2023-08-07 ENCOUNTER — Encounter: Payer: Self-pay | Admitting: Radiology

## 2023-08-07 NOTE — Telephone Encounter (Signed)
 Cassandra Mcconnell

## 2023-08-08 ENCOUNTER — Ambulatory Visit (HOSPITAL_BASED_OUTPATIENT_CLINIC_OR_DEPARTMENT_OTHER)

## 2023-08-08 ENCOUNTER — Ambulatory Visit (INDEPENDENT_AMBULATORY_CARE_PROVIDER_SITE_OTHER): Payer: Worker's Compensation | Admitting: Student

## 2023-08-08 ENCOUNTER — Encounter (HOSPITAL_BASED_OUTPATIENT_CLINIC_OR_DEPARTMENT_OTHER): Payer: Self-pay | Admitting: Student

## 2023-08-08 DIAGNOSIS — S93421A Sprain of deltoid ligament of right ankle, initial encounter: Secondary | ICD-10-CM | POA: Diagnosis not present

## 2023-08-08 DIAGNOSIS — M79671 Pain in right foot: Secondary | ICD-10-CM | POA: Diagnosis not present

## 2023-08-08 DIAGNOSIS — S82034A Nondisplaced transverse fracture of right patella, initial encounter for closed fracture: Secondary | ICD-10-CM

## 2023-08-08 DIAGNOSIS — S63601A Unspecified sprain of right thumb, initial encounter: Secondary | ICD-10-CM

## 2023-08-08 DIAGNOSIS — M79641 Pain in right hand: Secondary | ICD-10-CM

## 2023-08-08 DIAGNOSIS — M1811 Unilateral primary osteoarthritis of first carpometacarpal joint, right hand: Secondary | ICD-10-CM | POA: Diagnosis not present

## 2023-08-08 DIAGNOSIS — S82001A Unspecified fracture of right patella, initial encounter for closed fracture: Secondary | ICD-10-CM | POA: Diagnosis not present

## 2023-08-08 DIAGNOSIS — M25561 Pain in right knee: Secondary | ICD-10-CM | POA: Diagnosis not present

## 2023-08-08 NOTE — Progress Notes (Signed)
 Chief Complaint: Right patella fracture, right hand pain, right foot pain    History of Present Illness The patient, with a history of back and neck surgeries, presents with multiple injuries sustained from a recent fall that occurred on 07/23/2023.  She reports tripping and falling due to an uneven sidewalk.  The patient reports two fractures in the right elbow confirmed by recent CT scan, causing significant pain and limiting her ability to perform daily activities.  She remains immobilized in a splint with use of a sling.  The patient also reports a fracture in the patella, which remains in a knee immobilizer.  She is nonweightbearing.  In addition to the fractures, the patient has pain in the right ankle and wrist. The ankle pain is located medially and radiates up the leg, while the wrist pain mostly affects the thumb area. The patient reports that these injuries are causing significant discomfort, affecting her sleep and daily activities.  Overall pain levels are moderate to severe.  The patient has been taking tramadol for pain management but reports it is not effective and only causes drowsiness. The patient expresses a preference for avoiding narcotics due to previous experiences with side effects and withdrawal symptoms.   Surgical History:   None  PMH/PSH/Family History/Social History/Meds/Allergies:    Past Medical History:  Diagnosis Date   Ankle fracture    left   Anxiety    Arthritis    Back pain    Bell's palsy    Left side, 2009, 2017   Bell's palsy    Depression    Diverticulitis    Dyspnea    GERD (gastroesophageal reflux disease)    Headache    History of hiatal hernia    Hyperlipidemia    Hypertension    Postoperative nausea 04/21/2016   Toe fracture    left great toe   Wears glasses    Past Surgical History:  Procedure Laterality Date   BACK SURGERY  2004   l4/5 fusion   BREAST REDUCTION SURGERY Bilateral 08/08/2014    Procedure: BILATERAL BREAST REDUCTION  (BREAST);  Surgeon: Glenna Fellows, MD;  Location: [facility];  Service: Plastics;  Laterality: Bilateral;   CATARACT EXTRACTION Bilateral    CERVICAL DISCECTOMY  12/12/2018   3 level    CHOLECYSTECTOMY     1998   INJECTION KNEE Left    KNEE ARTHROSCOPY     LAPAROSCOPIC BILATERAL SALPINGO OOPHERECTOMY Bilateral 03/11/2015   Procedure: LAPAROSCOPIC BILATERAL SALPINGO OOPHORECTOMY;  Surgeon: Lazaro Arms, MD;  Location: [facility];  Service: Gynecology;  Laterality: Bilateral;   LAPAROSCOPIC GASTRIC SLEEVE RESECTION WITH HIATAL HERNIA REPAIR N/A 03/22/2016   Procedure: LAPAROSCOPIC GASTRIC SLEEVE RESECTION WITH HIATAL HERNIA REPAIR WITHUPPER ENDOSCOPY;  Surgeon: Ovidio Kin, MD;  Location: [facility];  Service: General;  Laterality: N/A;   REDUCTION MAMMAPLASTY Bilateral    SEPTOPLASTY  1988   tonsillectomy  1991   TONSILLECTOMY     Social History   Socioeconomic History   Marital status: Married    Spouse name: Not on file   Number of children: 1   Years of education: Not on file   Highest education level: Some college, no degree  Occupational History   Occupation: stay at home mom  Tobacco Use   Smoking status: Never   Smokeless  tobacco: Never  Vaping Use   Vaping status: Never Used  Substance and Sexual Activity   Alcohol use: No   Drug use: No   Sexual activity: Not Currently    Partners: Male    Birth control/protection: Post-menopausal, Surgical    Comment: BSO 2016  Other Topics Concern   Not on file  Social History Narrative   Lives at home with her husband   Left handed   Caffeine: 2-4 cups of coffee daily   Social Drivers of Health   Financial Resource Strain: Low Risk  (05/15/2023)   Overall Financial Resource Strain (CARDIA)    Difficulty of Paying Living Expenses: Not very hard  Food Insecurity: No Food Insecurity (05/15/2023)   Hunger Vital Sign    Worried About Running Out of Food in the Last Year: Never  true    Ran Out of Food in the Last Year: Never true  Transportation Needs: No Transportation Needs (05/15/2023)   PRAPARE - Administrator, Civil Service (Medical): No    Lack of Transportation (Non-Medical): No  Physical Activity: Insufficiently Active (05/15/2023)   Exercise Vital Sign    Days of Exercise per Week: 3 days    Minutes of Exercise per Session: 30 min  Stress: Stress Concern Present (05/15/2023)   [employer] of Occupational Health - Occupational Stress Questionnaire    Feeling of Stress : To some extent  Social Connections: Socially Integrated (05/15/2023)   Social Connection and Isolation Panel [NHANES]    Frequency of Communication with Friends and Family: More than three times a week    Frequency of Social Gatherings with Friends and Family: Three times a week    Attends Religious Services: More than 4 times per year    Active Member of Clubs or Organizations: Yes    Attends Banker Meetings: 1 to 4 times per year    Marital Status: Married   Family History  Problem Relation Age of Onset   COPD Mother    Hypertension Mother    GER disease Mother    Heart attack Father    Prostate cancer Father    Cancer Paternal Uncle        prostate and bone   Cancer Paternal Uncle        prostate and bone   Heart attack Paternal Grandmother    Heart attack Paternal Grandfather    Stroke Paternal Grandfather    Hyperlipidemia Brother    Hypertension Brother    Diabetes Brother    Neuropathy Brother    Hyperlipidemia Brother    Hypertension Brother    Colon cancer Neg Hx    Stomach cancer Neg Hx    Breast cancer Neg Hx    Allergies  Allergen Reactions   Vistaril [Hydroxyzine] Shortness Of Breath    Dizzy, light headed    Zofran [Ondansetron Hcl] Shortness Of Breath and Rash    Numbness   Azithromycin Swelling   Conjugated Estrogens Other (See Comments)    emotional   Cyclobenzaprine Hcl     REACTION: disorientation   Hydrocodone      Other reaction(s): Unknown   Misc. Sulfonamide Containing Compounds Other (See Comments)   Norco [Hydrocodone-Acetaminophen] Other (See Comments)    Felt like she was going through withdrawal   Nsaids Other (See Comments)    Cannot have because of bariatric surgery   Oxycontin [Oxycodone]     Withdrawal feeling    Percocet [Oxycodone-Acetaminophen]     Hypotension  Robaxin [Methocarbamol]     Pt does not remember reaction    Rosuvastatin Dermatitis   Sulfa Antibiotics Hives and Itching   Wellbutrin [Bupropion Hcl] Other (See Comments)    Emotional, makes depression worse   Ciprofloxacin Itching and Rash   Dicyclomine Hcl Itching and Rash   Current Outpatient Medications  Medication Sig Dispense Refill   acetaminophen (TYLENOL) 500 MG tablet Take 1,000 mg by mouth every 6 (six) hours as needed. For pain      atorvastatin (LIPITOR) 40 MG tablet Take 1 tablet (40 mg total) by mouth daily. 90 tablet 3   citalopram (CELEXA) 40 MG tablet Take 40 mg by mouth daily.     desvenlafaxine (PRISTIQ) 100 MG 24 hr tablet Take 100 mg by mouth at bedtime.     ibuprofen (ADVIL) 800 MG tablet Take 1 tablet (800 mg total) by mouth every 8 (eight) hours as needed. 30 tablet 0   Olopatadine HCl 0.2 % SOLN Place 1 drop into both eyes daily as needed (allergies).     promethazine (PHENERGAN) 25 MG tablet Take 1 tablet (25 mg total) by mouth every 8 (eight) hours as needed for nausea or vomiting. 20 tablet 0   Propylene Glycol (SYSTANE BALANCE) 0.6 % SOLN Apply 1 drop to eye daily as needed (dry eyes).     SEMAGLUTIDE-WEIGHT MANAGEMENT  Inject 2 mg into the skin once a week. Compounded pharmacy     traMADol (ULTRAM) 50 MG tablet Take 1 tablet (50 mg total) by mouth every 6 (six) hours as needed. 30 tablet 0   No current facility-administered medications for this visit.   No results found.  Review of Systems:   A ROS was performed including pertinent positives and negatives as documented in the  HPI.  Physical Exam :   Constitutional: NAD and appears stated age Neurological: Alert and oriented Psych: Appropriate affect and cooperative Last menstrual period 02/10/2015.   Comprehensive Musculoskeletal Exam:    Right ankle exam demonstrates no obvious abnormality.  Tenderness along the medial aspect over the deltoid ligament and anteriorly over ankle dorsiflexors.  Ankle remains stable with inversion and eversion with a negative anterior drawer.  DP and PT pulses are 2+.  Neurosensory exam to the foot and lower leg intact.  Strength and motion exam of the right knee deferred today.  Knee remains in immobilizer with use of wheelchair for nonweightbearing status. Exam of the right hand demonstrates mild swelling with tenderness over the palmar aspect of the thenar eminence.  Able to form a composite fist with good strength and perform thumb opposition.  Normal wrist range of motion with flexion, extension, and deviation.  Distal neurosensory exam intact.  Radial pulse 2+.  Imaging:   Xray (right knee 2 views): Nondisplaced fracture of the inferior pole of the patella without significant changes compared to prior x-ray  Xray (right foot 3 views): No evidence of acute fracture or dislocation.  Plantar calcaneal spur noted.  Xray (right hand 3 views): No evidence of acute fracture or dislocation.  Mild degenerative changes at the first [facility].  Small chronic appearing calcifications noted just distal to the ulnar styloid.   I personally reviewed and interpreted the radiographs.      Assessment & Plan Right elbow fracture with loose bodies   CT of the left elbow on 07/29/23 demonstrates multiple loose bodies within the elbow suspicious for acute fracture. Schedule a follow-up early next week with Dr. Hermina Loosen to discuss treatment plan and any possible  surgical intervention.  Splint was adjusted, padded, and rewrapped today.  Right patella fracture   Nondisplaced transverse of the inferior  pole.  No significant changes seen compared to prior x-ray over 2 weeks ago.  Discussed switching to a hinged knee brace from the immobilizer for stability and weight-bearing.  Brace provided today which will be locked in extension. Allow weight-bearing as tolerated with the brace.  Right wrist and hand sprain   X-rays show no fractures, indicating a likely sprain. She has difficulty with grip strength. Continue using the wrist brace for support.  Right ankle sprain   Patient sustained a likely inversion mechanism during the fall.  X-rays show no fractures, indicating a medial ankle sprain.  Consider using a small ankle brace if needed. Advise weight-bearing as tolerated.  Pain management   Tramadol causes drowsiness without relief. She is unable to take NSAIDs or codeine and prefers to avoid narcotics, although has had adverse reactions to most narcotics.. Discussed using Tylenol during the day and tramadol at night. Consider tramadol at night if needed for pain relief and sleep.  Follow-up   She requires follow-up for injury management and elbow fracture discussion. Schedule a follow-up with Dr. Hermina Loosen for further assessment and management of the elbow.     I personally saw and evaluated the patient, and participated in the management and treatment plan.  Sharrell Deck, PA-C Orthopedics

## 2023-08-11 ENCOUNTER — Encounter (HOSPITAL_BASED_OUTPATIENT_CLINIC_OR_DEPARTMENT_OTHER): Payer: Self-pay

## 2023-08-14 ENCOUNTER — Ambulatory Visit (INDEPENDENT_AMBULATORY_CARE_PROVIDER_SITE_OTHER): Payer: Worker's Compensation | Admitting: Orthopaedic Surgery

## 2023-08-14 ENCOUNTER — Other Ambulatory Visit (HOSPITAL_BASED_OUTPATIENT_CLINIC_OR_DEPARTMENT_OTHER): Payer: Self-pay

## 2023-08-14 DIAGNOSIS — M25531 Pain in right wrist: Secondary | ICD-10-CM | POA: Diagnosis not present

## 2023-08-14 DIAGNOSIS — S93421A Sprain of deltoid ligament of right ankle, initial encounter: Secondary | ICD-10-CM

## 2023-08-14 DIAGNOSIS — S82034A Nondisplaced transverse fracture of right patella, initial encounter for closed fracture: Secondary | ICD-10-CM

## 2023-08-14 DIAGNOSIS — M25522 Pain in left elbow: Secondary | ICD-10-CM | POA: Diagnosis not present

## 2023-08-14 MED ORDER — HYDROMORPHONE HCL 2 MG PO TABS
2.0000 mg | ORAL_TABLET | ORAL | 0 refills | Status: AC | PRN
  Filled 2023-08-14: qty 20, 4d supply, fill #0

## 2023-08-14 NOTE — Progress Notes (Signed)
 Chief Complaint: Right patella fracture, right hand pain, right foot pain       08/14/2023: Today for follow-up of the above injuries.  She is having a very difficult time mobilizing and has been having quite significant pain in all of her musculoskeletal injuries.  History of Present Illness The patient, with a history of back and neck surgeries, presents with multiple injuries sustained from a recent fall that occurred on 07/23/2023.  She reports tripping and falling due to an uneven sidewalk.  The patient reports two fractures in the right elbow confirmed by recent CT scan, causing significant pain and limiting her ability to perform daily activities.  She remains immobilized in a splint with use of a sling.  The patient also reports a fracture in the patella, which remains in a knee immobilizer.  She is nonweightbearing.   In addition to the fractures, the patient has pain in the right ankle and wrist. The ankle pain is located medially and radiates up the leg, while the wrist pain mostly affects the thumb area. The patient reports that these injuries are causing significant discomfort, affecting her sleep and daily activities.  Overall pain levels are moderate to severe.  The patient has been taking tramadol  for pain management but reports it is not effective and only causes drowsiness. The patient expresses a preference for avoiding narcotics due to previous experiences with side effects and withdrawal symptoms.     Surgical History:   None   PMH/PSH/Family History/Social History/Meds/Allergies:         Past Medical History:  Diagnosis Date   Ankle fracture      left   Anxiety     Arthritis     Back pain     Bell's palsy      Left side, 2009, 2017   Bell's palsy     Depression     Diverticulitis     Dyspnea     GERD (gastroesophageal reflux disease)     Headache     History of hiatal hernia     Hyperlipidemia     Hypertension     Postoperative nausea  04/21/2016   Toe fracture      left great toe   Wears glasses               Past Surgical History:  Procedure Laterality Date   BACK SURGERY   2004    l4/5 fusion   BREAST REDUCTION SURGERY Bilateral 08/08/2014    Procedure: BILATERAL BREAST REDUCTION  (BREAST);  Surgeon: Alger Infield, MD;  Location: [facility];  Service: Plastics;  Laterality: Bilateral;   CATARACT EXTRACTION Bilateral     CERVICAL DISCECTOMY   12/12/2018    3 level    CHOLECYSTECTOMY        1998   INJECTION KNEE Left     KNEE ARTHROSCOPY       LAPAROSCOPIC BILATERAL SALPINGO OOPHERECTOMY Bilateral 03/11/2015    Procedure: LAPAROSCOPIC BILATERAL SALPINGO OOPHORECTOMY;  Surgeon: Wendelyn Halter, MD;  Location: [facility];  Service: Gynecology;  Laterality: Bilateral;   LAPAROSCOPIC GASTRIC SLEEVE RESECTION WITH HIATAL HERNIA REPAIR N/A 03/22/2016    Procedure: LAPAROSCOPIC GASTRIC SLEEVE RESECTION WITH HIATAL HERNIA REPAIR WITHUPPER ENDOSCOPY;  Surgeon: Juanita Norlander, MD;  Location: [facility];  Service: General;  Laterality: N/A;   REDUCTION MAMMAPLASTY Bilateral     SEPTOPLASTY   1988   tonsillectomy   1991   TONSILLECTOMY  Social History         Socioeconomic History   Marital status: Married      Spouse name: Not on file   Number of children: 1   Years of education: Not on file   Highest education level: Some college, no degree  Occupational History   Occupation: stay at home mom  Tobacco Use   Smoking status: Never   Smokeless tobacco: Never  Vaping Use   Vaping status: Never Used  Substance and Sexual Activity   Alcohol  use: No   Drug use: No   Sexual activity: Not Currently      Partners: Male      Birth control/protection: Post-menopausal, Surgical      Comment: BSO 2016  Other Topics Concern   Not on file  Social History Narrative    Lives at home with her husband    Left handed    Caffeine: 2-4 cups of coffee daily    Social Drivers of Health         Financial Resource Strain: Low Risk  (05/15/2023)    Overall Financial Resource Strain (CARDIA)     Difficulty of Paying Living Expenses: Not very hard  Food Insecurity: No Food Insecurity (05/15/2023)    Hunger Vital Sign     Worried About Running Out of Food in the Last Year: Never true     Ran Out of Food in the Last Year: Never true  Transportation Needs: No Transportation Needs (05/15/2023)    PRAPARE - Therapist, art (Medical): No     Lack of Transportation (Non-Medical): No  Physical Activity: Insufficiently Active (05/15/2023)    Exercise Vital Sign     Days of Exercise per Week: 3 days     Minutes of Exercise per Session: 30 min  Stress: Stress Concern Present (05/15/2023)    [employer] of Occupational Health - Occupational Stress Questionnaire     Feeling of Stress : To some extent  Social Connections: Socially Integrated (05/15/2023)    Social Connection and Isolation Panel [NHANES]     Frequency of Communication with Friends and Family: More than three times a week     Frequency of Social Gatherings with Friends and Family: Three times a week     Attends Religious Services: More than 4 times per year     Active Member of Clubs or Organizations: Yes     Attends Banker Meetings: 1 to 4 times per year     Marital Status: Married         Family History  Problem Relation Age of Onset   COPD Mother     Hypertension Mother     GER disease Mother     Heart attack Father     Prostate cancer Father     Cancer Paternal Uncle          prostate and bone   Cancer Paternal Uncle          prostate and bone   Heart attack Paternal Grandmother     Heart attack Paternal Grandfather     Stroke Paternal Grandfather     Hyperlipidemia Brother     Hypertension Brother     Diabetes Brother     Neuropathy Brother     Hyperlipidemia Brother     Hypertension Brother     Colon cancer Neg Hx     Stomach cancer Neg Hx     Breast cancer Neg  Hx          Allergies       Allergies  Allergen Reactions   Vistaril [Hydroxyzine] Shortness Of Breath      Dizzy, light headed    Zofran  [Ondansetron  Hcl] Shortness Of Breath and Rash      Numbness   Azithromycin  Swelling   Conjugated Estrogens Other (See Comments)      emotional   Cyclobenzaprine  Hcl        REACTION: disorientation   Hydrocodone         Other reaction(s): Unknown   Misc. Sulfonamide Containing Compounds Other (See Comments)   Norco [Hydrocodone -Acetaminophen ] Other (See Comments)      Felt like she was going through withdrawal   Nsaids Other (See Comments)      Cannot have because of bariatric surgery   Oxycontin  [Oxycodone ]        Withdrawal feeling    Percocet [Oxycodone -Acetaminophen ]        Hypotension     Robaxin [Methocarbamol]        Pt does not remember reaction    Rosuvastatin Dermatitis   Sulfa Antibiotics Hives and Itching   Wellbutrin [Bupropion Hcl] Other (See Comments)      Emotional, makes depression worse   Ciprofloxacin  Itching and Rash   Dicyclomine Hcl Itching and Rash            Current Outpatient Medications  Medication Sig Dispense Refill   acetaminophen  (TYLENOL ) 500 MG tablet Take 1,000 mg by mouth every 6 (six) hours as needed. For pain        atorvastatin  (LIPITOR) 40 MG tablet Take 1 tablet (40 mg total) by mouth daily. 90 tablet 3   citalopram  (CELEXA ) 40 MG tablet Take 40 mg by mouth daily.       desvenlafaxine  (PRISTIQ ) 100 MG 24 hr tablet Take 100 mg by mouth at bedtime.       ibuprofen  (ADVIL ) 800 MG tablet Take 1 tablet (800 mg total) by mouth every 8 (eight) hours as needed. 30 tablet 0   Olopatadine  HCl 0.2 % SOLN Place 1 drop into both eyes daily as needed (allergies).       promethazine  (PHENERGAN ) 25 MG tablet Take 1 tablet (25 mg total) by mouth every 8 (eight) hours as needed for nausea or vomiting. 20 tablet 0   Propylene Glycol (SYSTANE BALANCE) 0.6 % SOLN Apply 1 drop to eye daily as needed (dry eyes).        SEMAGLUTIDE-WEIGHT MANAGEMENT [place] Inject 2 mg into the skin once a week. Compounded pharmacy       traMADol  (ULTRAM ) 50 MG tablet Take 1 tablet (50 mg total) by mouth every 6 (six) hours as needed. 30 tablet 0      No current facility-administered medications for this visit.      Imaging Results (Last 48 hours)  No results found.     Review of Systems:   A ROS was performed including pertinent positives and negatives as documented in the HPI.   Physical Exam :   Constitutional: NAD and appears stated age Neurological: Alert and oriented Psych: Appropriate affect and cooperative Last menstrual period 02/10/2015.    Comprehensive Musculoskeletal Exam:     Right ankle exam demonstrates no obvious abnormality.  Tenderness along the medial aspect over the deltoid ligament and anteriorly over ankle dorsiflexors.  Ankle remains stable with inversion and eversion with a negative anterior drawer.  DP and PT pulses are 2+.  Neurosensory exam to the  foot and lower leg intact.  Strength and motion exam of the right knee deferred today.  Knee remains in immobilizer with use of wheelchair for nonweightbearing status. Exam of the right hand demonstrates mild swelling with tenderness over the palmar aspect of the thenar eminence.  Able to form a composite fist with good strength and perform thumb opposition.  Normal wrist range of motion with flexion, extension, and deviation.  Distal neurosensory exam intact.  Radial pulse 2+.   Imaging:   Xray (right knee 2 views): Nondisplaced fracture of the inferior pole of the patella without significant changes compared to prior x-ray   Xray (right foot 3 views): No evidence of acute fracture or dislocation.  Plantar calcaneal spur noted.   Xray (right hand 3 views): No evidence of acute fracture or dislocation.  Mild degenerative changes at the first [facility].  Small chronic appearing calcifications noted just distal to the ulnar styloid.     I personally  reviewed and interpreted the radiographs.          Assessment & Plan 58 year old female with multiple musculoskeletal injuries after a fall including a right patella fracture, right wrist pain, left elbow contusion with loose bodies.  Overall at this time I have advised that I would like her to work with occupational therapy for range of motion and activity as tolerated on bilateral left elbow as well as right wrist.  I do believe that much of her stiffness is likely related to splint utilization that I would like her to work with range of motion.  I have prescribed her pain medication to help work with this.  With regard to the right knee she can continue to be weightbearing and activity as tolerated.  I do believe she can also progress the range of motion as tolerated given the fact that her fracture is essentially nondisplaced.  I will plan to see her back in 4 weeks.  We will begin physical therapy for the lower extremities.  Check her progress in 4 weeks        I personally saw and evaluated the patient, and participated in the management and treatment plan.

## 2023-08-16 ENCOUNTER — Encounter: Admitting: Rehabilitative and Restorative Service Providers"

## 2023-08-17 ENCOUNTER — Telehealth (HOSPITAL_BASED_OUTPATIENT_CLINIC_OR_DEPARTMENT_OTHER): Payer: Self-pay | Admitting: Orthopaedic Surgery

## 2023-08-17 NOTE — Telephone Encounter (Signed)
 Patient states that her elbow is burning and wants to know what should do about this problem? Patient states that this a new problem. Best contact number [phone]

## 2023-08-22 ENCOUNTER — Encounter (HOSPITAL_BASED_OUTPATIENT_CLINIC_OR_DEPARTMENT_OTHER): Payer: Self-pay | Admitting: Orthopaedic Surgery

## 2023-08-22 ENCOUNTER — Ambulatory Visit: Payer: Medicare HMO | Admitting: Family Medicine

## 2023-08-23 ENCOUNTER — Encounter: Payer: Worker's Compensation | Admitting: Rehabilitative and Restorative Service Providers"

## 2023-08-23 ENCOUNTER — Telehealth: Payer: Self-pay

## 2023-08-23 ENCOUNTER — Telehealth: Payer: Self-pay | Admitting: Physician Assistant

## 2023-08-23 NOTE — Telephone Encounter (Signed)
It's ready

## 2023-08-23 NOTE — Telephone Encounter (Signed)
 Please copy all xrays from 07/25/23-present to CD. Patient needs copy of records & xrays as they have been dismissed. Please let me know when ready. Thank you!

## 2023-08-29 ENCOUNTER — Ambulatory Visit: Admitting: Physical Therapy

## 2023-08-30 DIAGNOSIS — F411 Generalized anxiety disorder: Secondary | ICD-10-CM | POA: Diagnosis not present

## 2023-08-30 DIAGNOSIS — F331 Major depressive disorder, recurrent, moderate: Secondary | ICD-10-CM | POA: Diagnosis not present

## 2023-08-30 DIAGNOSIS — F6381 Intermittent explosive disorder: Secondary | ICD-10-CM | POA: Diagnosis not present

## 2023-09-05 ENCOUNTER — Other Ambulatory Visit: Payer: Self-pay

## 2023-09-05 ENCOUNTER — Encounter: Payer: Self-pay | Admitting: Family Medicine

## 2023-09-05 DIAGNOSIS — E782 Mixed hyperlipidemia: Secondary | ICD-10-CM

## 2023-09-05 MED ORDER — ATORVASTATIN CALCIUM 40 MG PO TABS: 40.0000 mg | ORAL_TABLET | Freq: Every day | ORAL | 3 refills | Status: AC

## 2023-09-13 ENCOUNTER — Ambulatory Visit (HOSPITAL_BASED_OUTPATIENT_CLINIC_OR_DEPARTMENT_OTHER): Admitting: Orthopaedic Surgery

## 2023-09-20 DIAGNOSIS — F6381 Intermittent explosive disorder: Secondary | ICD-10-CM | POA: Diagnosis not present

## 2023-09-20 DIAGNOSIS — F411 Generalized anxiety disorder: Secondary | ICD-10-CM | POA: Diagnosis not present

## 2023-09-20 DIAGNOSIS — F331 Major depressive disorder, recurrent, moderate: Secondary | ICD-10-CM | POA: Diagnosis not present

## 2023-09-26 ENCOUNTER — Telehealth: Payer: Self-pay | Admitting: Orthopaedic Surgery

## 2023-09-26 NOTE — Telephone Encounter (Signed)
 Discharge letter sent certified mail returned unclaimed.

## 2023-11-14 DIAGNOSIS — F411 Generalized anxiety disorder: Secondary | ICD-10-CM | POA: Diagnosis not present

## 2023-11-14 DIAGNOSIS — F6381 Intermittent explosive disorder: Secondary | ICD-10-CM | POA: Diagnosis not present

## 2023-11-14 DIAGNOSIS — F331 Major depressive disorder, recurrent, moderate: Secondary | ICD-10-CM | POA: Diagnosis not present

## 2023-12-30 ENCOUNTER — Encounter (HOSPITAL_COMMUNITY): Payer: Self-pay

## 2023-12-30 ENCOUNTER — Emergency Department (HOSPITAL_COMMUNITY)
Admission: EM | Admit: 2023-12-30 | Discharge: 2023-12-30 | Disposition: A | Discharge status: Home / Self Care | Attending: Emergency Medicine | Admitting: Emergency Medicine

## 2023-12-30 ENCOUNTER — Emergency Department (HOSPITAL_COMMUNITY)

## 2023-12-30 ENCOUNTER — Other Ambulatory Visit: Payer: Self-pay

## 2023-12-30 DIAGNOSIS — I700 Atherosclerosis of aorta: Secondary | ICD-10-CM | POA: Diagnosis not present

## 2023-12-30 DIAGNOSIS — R0789 Other chest pain: Secondary | ICD-10-CM | POA: Insufficient documentation

## 2023-12-30 DIAGNOSIS — R079 Chest pain, unspecified: Secondary | ICD-10-CM | POA: Diagnosis not present

## 2023-12-30 DIAGNOSIS — Z981 Arthrodesis status: Secondary | ICD-10-CM | POA: Diagnosis not present

## 2023-12-30 LAB — COMPREHENSIVE METABOLIC PANEL WITH GFR
ALT: 21 U/L (ref 0–44)
AST: 29 U/L (ref 15–41)
Albumin: 4.1 g/dL (ref 3.5–5.0)
Alkaline Phosphatase: 77 U/L (ref 38–126)
Anion gap: 13 (ref 5–15)
BUN: 8 mg/dL (ref 6–20)
CO2: 21 mmol/L — ABNORMAL LOW (ref 22–32)
Calcium: 9.5 mg/dL (ref 8.9–10.3)
Chloride: 105 mmol/L (ref 98–111)
Creatinine, Ser: 0.78 mg/dL (ref 0.44–1.00)
GFR, Estimated: >60 mL/min (ref >60)
Glucose, Bld: 89 mg/dL (ref 70–99)
Potassium: 3.4 mmol/L — ABNORMAL LOW (ref 3.5–5.1)
Sodium: 139 mmol/L (ref 135–145)
Total Bilirubin: 0.7 mg/dL (ref 0.0–1.2)
Total Protein: 7 g/dL (ref 6.5–8.1)

## 2023-12-30 LAB — CBC WITH DIFFERENTIAL/PLATELET
Abs Immature Granulocytes: 0.01 K/uL (ref 0.00–0.07)
Basophils Absolute: 0.1 K/uL (ref 0.0–0.1)
Basophils Relative: 1 %
Eosinophils Absolute: 0.1 K/uL (ref 0.0–0.5)
Eosinophils Relative: 1 %
HCT: 40.4 % (ref 36.0–46.0)
Hemoglobin: 13.8 g/dL (ref 12.0–15.0)
Immature Granulocytes: 0 %
Lymphocytes Relative: 32 %
Lymphs Abs: 2.6 K/uL (ref 0.7–4.0)
MCH: 29.8 pg (ref 26.0–34.0)
MCHC: 34.2 g/dL (ref 30.0–36.0)
MCV: 87.3 fL (ref 80.0–100.0)
Monocytes Absolute: 0.9 K/uL (ref 0.1–1.0)
Monocytes Relative: 10 %
Neutro Abs: 4.6 K/uL (ref 1.7–7.7)
Neutrophils Relative %: 56 %
Platelets: 262 K/uL (ref 150–400)
RBC: 4.63 MIL/uL (ref 3.87–5.11)
RDW: 13.1 % (ref 11.5–15.5)
WBC: 8.2 K/uL (ref 4.0–10.5)
nRBC: 0 % (ref 0.0–0.2)

## 2023-12-30 LAB — TROPONIN I (HIGH SENSITIVITY)
Troponin I (High Sensitivity): 2 ng/L (ref <18)
Troponin I (High Sensitivity): 2 ng/L (ref <18)

## 2023-12-30 MED ORDER — ASPIRIN 81 MG PO CHEW
324.0000 mg | CHEWABLE_TABLET | Freq: Once | ORAL | Status: AC
  Administered 2023-12-30: 324 mg via ORAL
  Filled 2023-12-30: qty 4

## 2023-12-30 NOTE — Discharge Instructions (Addendum)
 Please take a baby aspirin  every day until you follow-up with the cardiology team.  I have given you their phone number above, please call first thing on Monday morning to make an appointment.  If you should develop severe or worsening symptoms please return to the emergency department immediately for repeat evaluation

## 2023-12-30 NOTE — ED Provider Notes (Signed)
 [place] EMERGENCY DEPARTMENT AT [facility] Provider Note   CSN: [id number] Arrival date & time: 12/30/23  8158     Patient presents with: Chest Pain   Cassandra Mcconnell is a 58 y.o. female.    Chest Pain  This patient is a 58 year old female, she has a history of anxiety on citalopram , states that she does take cholesterol medication in the form of atorvastatin , she does not smoke or drink, she does have a significant family history of a father who had a massive heart attack in the 73s and died, a brother who has had several strokes in the end before the 59 years old, she has noted that over the last few days she has had pain in the chest that is radiating into the jaw and the left arm, it will come on at rest but also when she is walking or moving or sitting, last up to 45 minutes before it resolves.  She does get nauseated when this occurs.  She denies any swelling of her legs and is not short of breath, she is not coughing, she has never had any cardiac disease that she knows of and has never seen a heart doctor.  She has never had a stress test.    Prior to Admission medications   Medication Sig Start Date End Date Taking? Authorizing Provider  acetaminophen  (TYLENOL ) 500 MG tablet Take 1,000 mg by mouth every 6 (six) hours as needed. For pain     [provider]  atorvastatin  (LIPITOR) 40 MG tablet Take 1 tablet (40 mg total) by mouth daily. 09/05/23   Severa Rock HERO, FNP  citalopram  (CELEXA ) 40 MG tablet Take 40 mg by mouth daily. 04/17/23   [provider]  desvenlafaxine  (PRISTIQ ) 100 MG 24 hr tablet Take 100 mg by mouth at bedtime. 10/24/22   [provider]  HYDROmorphone  (DILAUDID ) 2 MG tablet Take 1 tablet (2 mg total) by mouth every 4 (four) hours as needed for severe pain (pain score 7-10). 08/14/23   Genelle Standing, MD  ibuprofen  (ADVIL ) 800 MG tablet Take 1 tablet (800 mg total) by mouth every 8 (eight) hours as needed. 07/24/23   Gladis,  Mary-Margaret, FNP  Olopatadine  HCl 0.2 % SOLN Place 1 drop into both eyes daily as needed (allergies).    [provider]  promethazine  (PHENERGAN ) 25 MG tablet Take 1 tablet (25 mg total) by mouth every 8 (eight) hours as needed for nausea or vomiting. 11/16/22   Rakes, Rock HERO, FNP  Propylene Glycol (SYSTANE BALANCE) 0.6 % SOLN Apply 1 drop to eye daily as needed (dry eyes).    [provider]  SEMAGLUTIDE-WEIGHT MANAGEMENT [place] Inject 2 mg into the skin once a week. Compounded pharmacy    [provider]  traMADol  (ULTRAM ) 50 MG tablet Take 1 tablet (50 mg total) by mouth every 6 (six) hours as needed. 08/03/23   Persons, Ronal Dragon, PA    Allergies: Vistaril [hydroxyzine], Zofran  [ondansetron  hcl], Azithromycin , Conjugated estrogens, Cyclobenzaprine  hcl, Hydrocodone , Misc. sulfonamide containing compounds, Norco [hydrocodone -acetaminophen ], Nsaids, Oxycontin  [oxycodone ], Percocet [oxycodone -acetaminophen ], Robaxin [methocarbamol], Rosuvastatin, Sulfa antibiotics, Wellbutrin [bupropion hcl], Ciprofloxacin , and Dicyclomine hcl    Review of Systems  Cardiovascular:  Positive for chest pain.  All other systems reviewed and are negative.   Updated Vital Signs BP 128/77   Pulse 62   Temp 98.5 F (36.9 C) (Oral)   Resp 17   Ht 1.626 m (5' 4)   Wt 79.4 kg  LMP 02/10/2015 Comment: had BSO 2016  SpO2 99%   BMI 30.04 kg/m   Physical Exam Vitals and nursing note reviewed.  Constitutional:      General: She is not in acute distress.    Appearance: She is well-developed.  HENT:     Head: Normocephalic and atraumatic.     Mouth/Throat:     Pharynx: No oropharyngeal exudate.  Eyes:     General: No scleral icterus.       Right eye: No discharge.        Left eye: No discharge.     Conjunctiva/sclera: Conjunctivae normal.     Pupils: Pupils are equal, round, and reactive to light.  Neck:     Thyroid : No thyromegaly.     Vascular: No JVD.  Cardiovascular:      Rate and Rhythm: Normal rate and regular rhythm.     Heart sounds: Normal heart sounds. No murmur heard.    No friction rub. No gallop.  Pulmonary:     Effort: Pulmonary effort is normal. No respiratory distress.     Breath sounds: Normal breath sounds. No wheezing or rales.  Abdominal:     General: Bowel sounds are normal. There is no distension.     Palpations: Abdomen is soft. There is no mass.     Tenderness: There is no abdominal tenderness.  Musculoskeletal:        General: No tenderness. Normal range of motion.     Cervical back: Normal range of motion and neck supple.     Right lower leg: No edema.     Left lower leg: No edema.  Lymphadenopathy:     Cervical: No cervical adenopathy.  Skin:    General: Skin is warm and dry.     Findings: No erythema or rash.  Neurological:     Mental Status: She is alert.     Coordination: Coordination normal.  Psychiatric:        Behavior: Behavior normal.     (all labs ordered are listed, but only abnormal results are displayed) Labs Reviewed  COMPREHENSIVE METABOLIC PANEL WITH GFR - Abnormal; Notable for the following components:      Result Value   Potassium 3.4 (*)    CO2 21 (*)    All other components within normal limits  CBC WITH DIFFERENTIAL/PLATELET  TROPONIN I (HIGH SENSITIVITY)  TROPONIN I (HIGH SENSITIVITY)    EKG: EKG Interpretation Date/Time:  Saturday December 30 2023 18:54:03 EDT Ventricular Rate:  66 PR Interval:  154 QRS Duration:  86 QT Interval:  408 QTC Calculation: 428 R Axis:   51  Text Interpretation: Sinus rhythm Normal ECG since last tracing no significant change Confirmed by Cleotilde Rogue ([id number]) on 12/30/2023 7:36:14 PM  Radiology: [facility] Chest Port 1 View Result Date: 12/30/2023 CLINICAL DATA:  Chest pain. EXAM: PORTABLE CHEST 1 VIEW COMPARISON:  04/13/2021. FINDINGS: The heart size and mediastinal contours are within normal limits. There is atherosclerotic calcification of the aorta. No  consolidation, effusion, or pneumothorax is seen. Cervical spinal fusion hardware is present. IMPRESSION: No active disease. Electronically Signed   By: Leita Birmingham M.D.   On: 12/30/2023 19:10     Procedures   Medications Ordered in the ED  aspirin  chewable tablet 324 mg (324 mg Oral Given 12/30/23 1923)    Clinical Course as of 12/30/23 2206  Sat Dec 30, 2023  1936 Heart score is 3, she gets 1 4 high cholesterol 1 for age and 1 for  a moderately suspicious story.  Troponin is negative [BM]    Clinical Course User Index [BM] Cleotilde Rogue, MD                                 Medical Decision Making Amount and/or Complexity of Data Reviewed Labs: ordered. Radiology: ordered. ECG/medicine tests: ordered.  Risk OTC drugs.    This patient presents to the ED for concern of chest pain with some radiation into the jaw and the arm, this involves an extensive number of treatment options, and is a complaint that carries with it a high risk of complications and morbidity.  The differential diagnosis includes anxiety on although it could be anxiety the patient has risk factors including high cholesterol age, multiple family members who have had cardiac disease at an early age including a father who died in his 69s from massive heart attack, seems less likely to be pulmonary embolism, pneumothorax, pneumonia or pericarditis   Co morbidities / Chronic conditions that complicate the patient evaluation  Hypercholesterolemia   Additional history obtained:  Additional history obtained from EMR External records from outside source obtained and reviewed including medical record, including outpatient appointments, she is followed by orthopedics, followed by family doctor, no recent admissions to the hospital   Lab Tests:  I Ordered, and personally interpreted labs.  The pertinent results include: Troponin which is negative, repeat troponin is negative, both measuring at 2, CBC is normal,  metabolic panel is essentially normal   Imaging Studies ordered:  I ordered imaging studies including chest x-ray I independently visualized and interpreted imaging which showed no acute findings including no pneumothorax no pneumonia or abnormal lung or cardiac findings I agree with the radiologist interpretation   Cardiac Monitoring: / EKG:  The patient was maintained on a cardiac monitor.  I personally viewed and interpreted the cardiac monitored which showed an underlying rhythm of: Normal sinus rhythm,   Problem List / ED Course / Critical interventions / Medication management  The patient was kept on cardiac monitoring and giving a full dose aspirin  I ordered medication including aspirin  324 mg Reevaluation of the patient after these medicines showed that the patient had resolution of symptoms and did not have any recurrent symptoms while in the emergency department I have reviewed the patients home medicines and have made adjustments as needed   I discussed with the patient the need to follow-up with cardiology in the outpatient setting.  Her heart score is 3 and she is considered to be low risk and ability to be discharged, she is agreeable to this and understands the indications for return, she understands that she may very well have some obstructive coronary disease but it is not at the point where she is having exertional symptoms, she will return should she have any worsening symptoms at all for repeat exam.  I have encouraged her to follow-up with cardiology as she likely needs a stress test in the outpatient setting  Social Determinants of Health:  Family history of heart disease   Test / Admission - Considered:  Considered admission but has a heart score of 3, negative troponins, normal vital signs and a nonischemic EKG, she is agreeable to follow-up outpatient, ambulatory referral to cardiology given      Final diagnoses:  Left-sided chest pain    ED Discharge  Orders          Ordered    Ambulatory referral  to Cardiology        12/30/23 2205               Cleotilde Rogue, MD 12/30/23 2206

## 2023-12-30 NOTE — ED Triage Notes (Signed)
 Pt stated that she has been having chest pain that radiates into her jaw and left arm for 2-3 days. Pt stated that it comes and goes and stated that she was nauseated most of the day yesterday

## 2024-01-01 ENCOUNTER — Encounter: Payer: Self-pay | Admitting: *Deleted

## 2024-01-01 ENCOUNTER — Telehealth: Payer: Self-pay | Admitting: Family Medicine

## 2024-01-01 NOTE — Telephone Encounter (Signed)
 R/c to schedule hosp f/u apt. Pt only wants to see RAKES. Can nurse work pt in?  Copied from CRM #[id number]. Topic: Appointments - Scheduling Inquiry for Clinic >> Jan 01, 2024  3:57 PM Wess RAMAN wrote: Patient needs hospital follow up and would like it with her PCP only. She would like it on or before Friday. None available  Callback #: [phone]

## 2024-01-02 ENCOUNTER — Encounter: Payer: Self-pay | Admitting: Physician Assistant

## 2024-01-02 ENCOUNTER — Other Ambulatory Visit (HOSPITAL_COMMUNITY): Payer: Self-pay | Admitting: Student

## 2024-01-02 ENCOUNTER — Ambulatory Visit: Attending: Physician Assistant | Admitting: Physician Assistant

## 2024-01-02 VITALS — BP 120/70 | HR 65 | Ht 64.0 in | Wt 178.2 lb

## 2024-01-02 DIAGNOSIS — R072 Precordial pain: Secondary | ICD-10-CM

## 2024-01-02 DIAGNOSIS — M5412 Radiculopathy, cervical region: Secondary | ICD-10-CM

## 2024-01-02 DIAGNOSIS — Z6830 Body mass index (BMI) 30.0-30.9, adult: Secondary | ICD-10-CM | POA: Diagnosis not present

## 2024-01-02 DIAGNOSIS — K219 Gastro-esophageal reflux disease without esophagitis: Secondary | ICD-10-CM

## 2024-01-02 DIAGNOSIS — I10 Essential (primary) hypertension: Secondary | ICD-10-CM

## 2024-01-02 DIAGNOSIS — E782 Mixed hyperlipidemia: Secondary | ICD-10-CM | POA: Diagnosis not present

## 2024-01-02 MED ORDER — NITROGLYCERIN 0.4 MG SL SUBL: 0.4000 mg | SUBLINGUAL_TABLET | SUBLINGUAL | 1 refills | Status: AC | PRN

## 2024-01-02 MED ORDER — METOPROLOL TARTRATE 100 MG PO TABS
ORAL_TABLET | ORAL | 0 refills | Status: AC
Start: 2024-01-02 — End: ?

## 2024-01-02 MED ORDER — PANTOPRAZOLE SODIUM 40 MG PO TBEC: 40.0000 mg | DELAYED_RELEASE_TABLET | Freq: Every day | ORAL | 1 refills | Status: DC

## 2024-01-02 MED ORDER — ASPIRIN 81 MG PO TBEC: 81.0000 mg | DELAYED_RELEASE_TABLET | Freq: Every day | ORAL | Status: AC

## 2024-01-02 NOTE — Assessment & Plan Note (Signed)
 As noted, will start Protonix  40 mg daily to cover for the possibility of GI related symptoms/acid reflux.

## 2024-01-02 NOTE — Progress Notes (Signed)
 OFFICE NOTE:    Date:  01/02/2024  ID:  Cassandra Mcconnell, DOB 06/01/1965, MRN [id number] PCP: Severa Rock HERO, FNP  [facility] Providers Cardiologist:  None        Hypertension  Hyperlipidemia  Aortic atherosclerosis (CT 08/2016) FHx of CAD  Obesity  GERD Anxiety Spinal stenosis Obesity s/p gastric sleeve  S/p several back surgeries Bell's Palsy       Discussed the use of AI scribe software for clinical note transcription with the patient, who gave verbal consent to proceed. History of Present Illness Cassandra Mcconnell is a 58 y.o. female who is referred by Severa Rock HERO, FNP for chest pain.   Pt was seen in the ED 12/30/23 for chest pain. Labs/data personally reviewed and interpreted by me today 01/02/2024: K 3.4 (low), Creatinine (normal), ALT (normal), Hgb 13.8 (normal), hsTrop I 2 >> 2 (neg x 2). CXR no active disease. EKG: NSR, HR 66, normal axis, QTc 428, no acute STTW changes. ED physician recommended OP Cardiology follow up.   She has been experiencing chest pain for several weeks. The pain originates in the center of the chest and radiates down the left arm, into the fingers, up the left neck, and into the back of the head, with occasional pain through the left jaw. The episodes are intermittent, lasting from a few minutes to an hour, and are accompanied by nausea and sweating. The pain occurs at rest and is not triggered by specific activities.  Her family history is significant for coronary artery disease. Her father had a massive heart attack and myocardial infarction, attributed to alcohol  and smoking. Her brother, a heavy smoker, has had two myocardial infarctions. Her paternal grandmother and grandfather also died from heart attacks.  She denies smoking, alcohol , or drug use. She is married, stays at home, and has one child.   No leg swelling, fever, cough, vomiting, diarrhea, or pain associated with meals. She experiences nausea and sweating associated with the  chest pain, and occasional night sweats. She does not lay flat due to back issues and sleeps on an incline. No recent travel or hospital admissions aside from the ER visit.    ROS-See HPI    Studies Reviewed:      Labs from PCP personally reviewed and interpreted by me today 01/02/2024  04/21/23: TC 169, Trig 124, HDL 43, LDL 104        Physical Exam:  VS:  BP 120/70   Pulse 65   Ht 5' 4 (1.626 m)   Wt 178 lb 3.2 oz (80.8 kg)   LMP 02/10/2015 Comment: had BSO 2016  SpO2 97%   BMI 30.59 kg/m        Wt Readings from Last 3 Encounters:  01/02/24 178 lb 3.2 oz (80.8 kg)  12/30/23 175 lb (79.4 kg)  07/24/23 173 lb (78.5 kg)    Constitutional:      Appearance: Healthy appearance. Not in distress.  Neck:     Vascular: No carotid bruit. JVD normal.  Pulmonary:     Breath sounds: Normal breath sounds. No wheezing. No rales.  Cardiovascular:     Normal rate. Regular rhythm.     Murmurs: There is no murmur.  Edema:    Peripheral edema absent.  Abdominal:     Palpations: Abdomen is soft.       Assessment and Plan:    Assessment & Plan Precordial chest pain She has had intermittent substernal chest discomfort for the  past few weeks.  It is severe at times.  She has had symptoms last 15 minutes up to an hour.  She had worsening symptoms when she went to the emergency room.  It is reassuring that her troponins were negative.  Her EKG did not demonstrate any acute changes.  She does have several risk factors for CAD.  She has a history of hypertension.  However, she no longer takes medications.  She is treated for hyperlipidemia.  She has a strong family history of CAD and a history of aortic atherosclerosis.  Her symptoms sound cardiac.  I have recommended proceeding with coronary CTA to rule out ischemic heart disease as well as echocardiogram rule structural heart disease.  She does have a history of acid reflux as well as prior bariatric surgery.  Question of her symptoms could be GI  related. -Arrange CCTA -Rx for metoprolol  tartrate 100 mg x 1 prior to CT -Arrange echocardiogram -Start ASA 81 mg daily -Continue Lipitor 40 mg daily -Nitroglycerin  as needed -If she has worsening/changing symptoms, she knows to go to the emergency room -Follow-up 3 months -If she needs chronic follow-up with cardiology, will establish with provider in [place] Primary hypertension Blood pressure controlled without medications.  She lost 90+ pounds and came off of medications sometime ago. Mixed hyperlipidemia LDL in December 2024 was 104.  With a history of aortic atherosclerosis, would push for goal of less than 100.  If her CCTA demonstrates significant coronary calcification or CAD, we will push for goal LDL less than 70. Gastroesophageal reflux disease without esophagitis As noted, will start Protonix  40 mg daily to cover for the possibility of GI related symptoms/acid reflux.         Dispo:  Return in about 3 months (around 04/02/2024) for Follow up after testing, w/ Glendia Ferrier, PA-C.  Signed, Glendia Ferrier, PA-C

## 2024-01-02 NOTE — Assessment & Plan Note (Signed)
 LDL in December 2024 was 104.  With a history of aortic atherosclerosis, would push for goal of less than 100.  If her CCTA demonstrates significant coronary calcification or CAD, we will push for goal LDL less than 70.

## 2024-01-02 NOTE — Patient Instructions (Addendum)
 Medication Instructions:  Your physician has recommended you make the following change in your medication:   START Protonix  40 mg taking 1 daily  START Aspirin  81 mg taking 1 daily  START Nitroglycerin  0.4 s/l tablet.SABRASABRAThe proper use and anticipated side effects of nitroglycerine has been carefully explained.  If a single episode of chest pain is not relieved by one tablet, the patient will try another within 5 minutes; and if this doesn't relieve the pain, the patient is instructed to call 911 for transportation to an emergency department.   *If you need a refill on your cardiac medications before your next appointment, please call your pharmacy*  Lab Work: TODAY:  BMET  If you have labs (blood work) drawn today and your tests are completely normal, you will receive your results only by: MyChart Message (if you have MyChart) OR A paper copy in the mail If you have any lab test that is abnormal or we need to change your treatment, we will call you to review the results.  Testing/Procedures: Your physician has requested that you have an echocardiogram. Echocardiography is a painless test that uses sound waves to create images of your heart. It provides your doctor with information about the size and shape of your heart and how well your heart's chambers and valves are working. This procedure takes approximately one hour. There are no restrictions for this procedure. Please do NOT wear cologne, perfume, aftershave, or lotions (deodorant is allowed). Please arrive 15 minutes prior to your appointment time.  Please note: We ask at that you not bring children with you during ultrasound (echo/ vascular) testing. Due to room size and safety concerns, children are not allowed in the ultrasound rooms during exams. Our front office staff cannot provide observation of children in our lobby area while testing is being conducted. An adult accompanying a patient to their appointment will only be allowed in  the ultrasound room at the discretion of the ultrasound technician under special circumstances. We apologize for any inconvenience.   Your physician has requested that you have cardiac CT. Cardiac computed tomography (CT) is a painless test that uses an x-ray machine to take clear, detailed pictures of your heart. For further information please visit [email or web address] Please follow instruction sheet as BELOW:    Your cardiac CT will be scheduled at one of the below locations:   [facility] [address] [place], [place] [address] [phone] (Severe contrast allergies only)  OR   [facility] [address] [place], [place] [address] [phone]  OR   [facility] [address] [place], [place] [address] [phone]  OR   [facility] and V[facility] Tower [address]  [place], [place] [address] [phone]  OR   [facility] [address] [place], [place] [phone]  If scheduled at [facility], please arrive at the [facility] and Children's Entrance (Entrance C2) of [facility] 30 minutes prior to test start time. You can use the FREE valet parking offered at entrance C (encouraged to control the heart rate for the test)  Proceed to the [facility] Radiology Department (first floor) to check-in and test prep.  All radiology patients and guests should use entrance C2 at [facility], accessed from [facility], even though the hospital's physical address listed is [address].  If scheduled at the Heart and Vascular Tower at [address], please enter the parking lot  using the [address] entrance and use the FREE valet service at the patient drop-off area. Enter the building and check-in with registration on the main floor.  If scheduled at [facility], please arrive to the Heart and Vascular Center 15 mins early for  check-in and test prep.  There is spacious parking and easy access to the radiology department from the [facility] Heart and Vascular entrance. Please enter here and check-in with the desk attendant.   If scheduled at [facility], please arrive 30 minutes early for check-in and test prep.  Please follow these instructions carefully (unless otherwise directed):  An IV will be required for this test and Nitroglycerin  will be given.   On the Night Before the Test: Be sure to Drink plenty of water . Do not consume any caffeinated/decaffeinated beverages or chocolate 12 hours prior to your test. Do not take any antihistamines 12 hours prior to your test.   On the Day of the Test: Drink plenty of water  until 1 hour prior to the test. Do not eat any food 1 hour prior to test. You may take your regular medications prior to the test.  Take metoprolol  (Lopressor ) 100  two hours prior to test. THIS HAS BEEN SENT TO [facility]- please wear underwire-free bra if available, avoid dresses & tight clothing      After the Test: Drink plenty of water . After receiving IV contrast, you may experience a mild flushed feeling. This is normal. On occasion, you may experience a mild rash up to 24 hours after the test. This is not dangerous. If this occurs, you can take Benadryl 25 mg, Zyrtec , Claritin, or Allegra and increase your fluid intake. (Patients taking Tikosyn should avoid Benadryl, and may take Zyrtec , Claritin, or Allegra) If you experience trouble breathing, this can be serious. If it is severe call 911 IMMEDIATELY. If it is mild, please call our office.  We will call to schedule your test 2-4 weeks out understanding that some insurance companies will need an authorization prior to the service being performed.   For more information and frequently asked questions, please visit our website : [email or web address]  For non-scheduling related questions, please contact  the cardiac imaging nurse navigator should you have any questions/concerns: Cardiac Imaging Nurse Navigators Direct Office Dial: [phone]   For scheduling needs, including cancellations and rescheduling, please call [place], [phone].   Follow-Up: At [facility], you and your health needs are our priority.  As part of our continuing mission to provide you with exceptional heart care, our providers are all part of one team.  This team includes your primary Cardiologist (physician) and Advanced Practice Providers or APPs (Physician Assistants and Nurse Practitioners) who all work together to provide you with the care you need, when you need it.  Your next appointment:   3 month(s)  Provider:   Glendia Ferrier, PA-C          We recommend signing up for the patient portal called MyChart.  Sign up information is provided on this After Visit Summary.  MyChart is used to connect with patients for Virtual Visits (Telemedicine).  Patients are able to view lab/test results, encounter notes, upcoming appointments, etc.  Non-urgent messages can be sent to your provider as well.   To learn more about what you can do with MyChart, go to [email or web address].   Other Instructions

## 2024-01-02 NOTE — Assessment & Plan Note (Signed)
 Blood pressure controlled without medications.  She lost 90+ pounds and came off of medications sometime ago.

## 2024-01-03 ENCOUNTER — Ambulatory Visit: Payer: Self-pay | Admitting: Physician Assistant

## 2024-01-03 LAB — BASIC METABOLIC PANEL WITH GFR
BUN/Creatinine Ratio: 12 (ref 9–23)
BUN: 10 mg/dL (ref 6–24)
CO2: 23 mmol/L (ref 20–29)
Calcium: 10 mg/dL (ref 8.7–10.2)
Chloride: 103 mmol/L (ref 96–106)
Creatinine, Ser: 0.82 mg/dL (ref 0.57–1.00)
Glucose: 87 mg/dL (ref 70–99)
Potassium: 4.3 mmol/L (ref 3.5–5.2)
Sodium: 140 mmol/L (ref 134–144)
eGFR: 82 mL/min/1.73 (ref >59)

## 2024-01-08 DIAGNOSIS — F6381 Intermittent explosive disorder: Secondary | ICD-10-CM | POA: Diagnosis not present

## 2024-01-08 DIAGNOSIS — F411 Generalized anxiety disorder: Secondary | ICD-10-CM | POA: Diagnosis not present

## 2024-01-08 DIAGNOSIS — F331 Major depressive disorder, recurrent, moderate: Secondary | ICD-10-CM | POA: Diagnosis not present

## 2024-01-09 ENCOUNTER — Encounter (HOSPITAL_COMMUNITY): Payer: Self-pay

## 2024-01-09 NOTE — Progress Notes (Signed)
 Pt has been made aware of normal result and verbalized understanding.  jw

## 2024-01-10 ENCOUNTER — Ambulatory Visit (HOSPITAL_COMMUNITY)
Admission: RE | Admit: 2024-01-10 | Discharge: 2024-01-10 | Disposition: A | Discharge status: Home / Self Care | Source: Ambulatory Visit | Attending: Student | Admitting: Student

## 2024-01-10 DIAGNOSIS — M4803 Spinal stenosis, cervicothoracic region: Secondary | ICD-10-CM | POA: Diagnosis not present

## 2024-01-10 DIAGNOSIS — M4802 Spinal stenosis, cervical region: Secondary | ICD-10-CM | POA: Diagnosis not present

## 2024-01-10 DIAGNOSIS — M47812 Spondylosis without myelopathy or radiculopathy, cervical region: Secondary | ICD-10-CM | POA: Diagnosis not present

## 2024-01-10 DIAGNOSIS — M5412 Radiculopathy, cervical region: Secondary | ICD-10-CM | POA: Insufficient documentation

## 2024-01-10 DIAGNOSIS — M5023 Other cervical disc displacement, cervicothoracic region: Secondary | ICD-10-CM | POA: Diagnosis not present

## 2024-01-12 ENCOUNTER — Ambulatory Visit (HOSPITAL_COMMUNITY)

## 2024-01-12 ENCOUNTER — Telehealth: Payer: Self-pay | Admitting: Physician Assistant

## 2024-01-12 DIAGNOSIS — R072 Precordial pain: Secondary | ICD-10-CM

## 2024-01-12 NOTE — Telephone Encounter (Signed)
  The patient canceled her CT morph and said she doesn't want a test that uses dye.

## 2024-01-15 ENCOUNTER — Encounter: Payer: Self-pay | Admitting: Family Medicine

## 2024-01-15 DIAGNOSIS — S42402A Unspecified fracture of lower end of left humerus, initial encounter for closed fracture: Secondary | ICD-10-CM | POA: Diagnosis not present

## 2024-01-15 DIAGNOSIS — S82001A Unspecified fracture of right patella, initial encounter for closed fracture: Secondary | ICD-10-CM | POA: Diagnosis not present

## 2024-01-15 DIAGNOSIS — M7712 Lateral epicondylitis, left elbow: Secondary | ICD-10-CM | POA: Diagnosis not present

## 2024-01-16 NOTE — Telephone Encounter (Signed)
 Appt scheduled for 01/25/24 - patient aware

## 2024-01-17 ENCOUNTER — Other Ambulatory Visit (HOSPITAL_COMMUNITY): Payer: Self-pay | Admitting: Sports Medicine

## 2024-01-17 DIAGNOSIS — M25522 Pain in left elbow: Secondary | ICD-10-CM

## 2024-01-18 DIAGNOSIS — M5412 Radiculopathy, cervical region: Secondary | ICD-10-CM | POA: Diagnosis not present

## 2024-01-18 NOTE — Telephone Encounter (Signed)
 We can do a nuclear stress test if she is willing to do that. There is no IV contrast. A nuclear imaging agent is used to take an image of blood flow to the heart. But there is no dye (IV Contrast). If she is willing, we can do a [employer]. Glendia Ferrier, PA-C    01/18/2024 3:19 PM

## 2024-01-19 ENCOUNTER — Other Ambulatory Visit: Payer: Self-pay | Admitting: Physician Assistant

## 2024-01-19 DIAGNOSIS — R072 Precordial pain: Secondary | ICD-10-CM

## 2024-01-19 NOTE — Telephone Encounter (Signed)
 Call placed to pt, she is ok with the nuclear stress test.  Order placed for [facility].  Instructions sent to pt via mychart.    You are scheduled for a Myocardial Perfusion Imaging Study Please arrive 15 minutes prior to your appointment time for registration and insurance purposes.  The test will take approximately 3 to 4 hours to complete; you may bring reading material.  If someone comes with you to your appointment, they will need to remain in the main lobby due to limited space in the testing area. **If you are pregnant or breastfeeding, please notify the nuclear lab prior to your appointment**  How to prepare for your Myocardial Perfusion Test: Do not eat or drink 3 hours prior to your test, except you may have water . Do not consume products containing caffeine (regular or decaffeinated) 12 hours prior to your test. (ex: coffee, chocolate, sodas, tea). Do bring a list of your current medications with you.  If not listed below, you may take your medications as normal. Do not take metoprolol  (Lopressor , Toprol ) for 24 hours prior to the test.  Bring the medication to your appointment as you may be required to take it once the test is complete. Do wear comfortable clothes (no dresses or overalls) and walking shoes, tennis shoes preferred (No heels or open toe shoes are allowed). Do NOT wear cologne, perfume, aftershave, or lotions (deodorant is allowed). If these instructions are not followed, your test will have to be rescheduled.

## 2024-01-21 ENCOUNTER — Ambulatory Visit (HOSPITAL_COMMUNITY)
Admission: RE | Admit: 2024-01-21 | Discharge: 2024-01-21 | Disposition: A | Discharge status: Home / Self Care | Source: Ambulatory Visit | Attending: Sports Medicine | Admitting: Sports Medicine

## 2024-01-21 DIAGNOSIS — M19022 Primary osteoarthritis, left elbow: Secondary | ICD-10-CM | POA: Diagnosis not present

## 2024-01-21 DIAGNOSIS — M25522 Pain in left elbow: Secondary | ICD-10-CM | POA: Insufficient documentation

## 2024-01-21 DIAGNOSIS — R609 Edema, unspecified: Secondary | ICD-10-CM | POA: Diagnosis not present

## 2024-01-25 ENCOUNTER — Inpatient Hospital Stay: Admitting: Family Medicine

## 2024-01-30 DIAGNOSIS — M5412 Radiculopathy, cervical region: Secondary | ICD-10-CM | POA: Diagnosis not present

## 2024-02-06 ENCOUNTER — Encounter (HOSPITAL_COMMUNITY): Payer: Self-pay | Admitting: *Deleted

## 2024-02-06 DIAGNOSIS — M7712 Lateral epicondylitis, left elbow: Secondary | ICD-10-CM | POA: Diagnosis not present

## 2024-02-06 DIAGNOSIS — M24022 Loose body in left elbow: Secondary | ICD-10-CM | POA: Diagnosis not present

## 2024-02-13 ENCOUNTER — Other Ambulatory Visit: Payer: Self-pay | Admitting: Physician Assistant

## 2024-02-13 DIAGNOSIS — R072 Precordial pain: Secondary | ICD-10-CM

## 2024-02-14 ENCOUNTER — Telehealth (HOSPITAL_COMMUNITY): Payer: Self-pay | Admitting: Physician Assistant

## 2024-02-14 ENCOUNTER — Ambulatory Visit (HOSPITAL_COMMUNITY)

## 2024-02-14 ENCOUNTER — Ambulatory Visit (HOSPITAL_COMMUNITY): Admission: RE | Admit: 2024-02-14 | Source: Ambulatory Visit

## 2024-02-14 NOTE — Telephone Encounter (Signed)
 Patient cancelled Myoview for reason below:  02/14/2024 8:05 AM [name], Cassandra Mcconnell  Cancel Rsn: Patient (pt called in stating she is sick with a stomach virus. she states she only wanted to Mcconnell/s the echo because the copay for a stress test is too expensive.)  Order will be removed from the WQ.

## 2024-02-14 NOTE — Telephone Encounter (Signed)
 Routed to [place] PA as FYI

## 2024-02-20 ENCOUNTER — Other Ambulatory Visit: Payer: Self-pay | Admitting: Medical Genetics

## 2024-02-20 DIAGNOSIS — Z006 Encounter for examination for normal comparison and control in clinical research program: Secondary | ICD-10-CM

## 2024-02-26 ENCOUNTER — Encounter (INDEPENDENT_AMBULATORY_CARE_PROVIDER_SITE_OTHER): Payer: Self-pay | Admitting: Radiology

## 2024-02-29 ENCOUNTER — Other Ambulatory Visit: Payer: Self-pay | Admitting: Physician Assistant

## 2024-03-01 DIAGNOSIS — R072 Precordial pain: Secondary | ICD-10-CM

## 2024-03-01 MED ORDER — PANTOPRAZOLE SODIUM 40 MG PO TBEC: 40.0000 mg | DELAYED_RELEASE_TABLET | Freq: Every day | ORAL | 1 refills | Status: DC

## 2024-03-01 NOTE — Telephone Encounter (Signed)
 Patient reports that she had canceled the test because she did not want to do it, but she has an upcoming surgery And they will not perform it until she has had this scan.  Informed her that we will place the orders, but someone from scheduling will have to call her to schedule the procedure. She verbalized understanding.

## 2024-03-04 ENCOUNTER — Telehealth (HOSPITAL_COMMUNITY): Payer: Self-pay | Admitting: Physician Assistant

## 2024-03-04 DIAGNOSIS — F6381 Intermittent explosive disorder: Secondary | ICD-10-CM | POA: Diagnosis not present

## 2024-03-04 DIAGNOSIS — F331 Major depressive disorder, recurrent, moderate: Secondary | ICD-10-CM | POA: Diagnosis not present

## 2024-03-04 NOTE — Telephone Encounter (Signed)
 Patient cancelled the scheduled Myoview for reason below:  02/14/2024 8:05 AM [name], Cassandra Mcconnell  Cancel Rsn: Patient (pt called in stating she is sick with a stomach virus. she states she only wanted to Mcconnell/s the echo because the copay for a stress test is too expensive.)  Order will be removed from the [facility].   Thank you.

## 2024-03-11 ENCOUNTER — Other Ambulatory Visit (HOSPITAL_COMMUNITY): Payer: Self-pay | Admitting: Physician Assistant

## 2024-03-11 ENCOUNTER — Ambulatory Visit (HOSPITAL_COMMUNITY)
Admission: RE | Admit: 2024-03-11 | Discharge: 2024-03-11 | Disposition: A | Discharge status: Home / Self Care | Source: Ambulatory Visit | Attending: Cardiology | Admitting: Cardiology

## 2024-03-11 DIAGNOSIS — R079 Chest pain, unspecified: Secondary | ICD-10-CM

## 2024-03-11 MED ORDER — TECHNETIUM TC 99M TETROFOSMIN IV KIT
10.7000 | PACK | Freq: Once | INTRAVENOUS | Status: AC | PRN
  Administered 2024-03-11: 10.7 via INTRAVENOUS

## 2024-03-11 MED ORDER — TECHNETIUM TC 99M TETROFOSMIN IV KIT
32.2000 | PACK | Freq: Once | INTRAVENOUS | Status: AC | PRN
Start: 2024-03-11 — End: 2024-03-11
  Administered 2024-03-11: 32.2 via INTRAVENOUS

## 2024-03-11 MED ORDER — REGADENOSON 0.4 MG/5ML IV SOLN
INTRAVENOUS | Status: AC
  Filled 2024-03-11: qty 5

## 2024-03-11 MED ORDER — REGADENOSON 0.4 MG/5ML IV SOLN
0.4000 mg | Freq: Once | INTRAVENOUS | Status: AC
  Administered 2024-03-11: 0.4 mg via INTRAVENOUS

## 2024-03-12 LAB — MYOCARDIAL PERFUSION IMAGING
Base ST Depression (mm): 0 mm
LV dias vol: 71 mL (ref 46–106)
LV sys vol: 14 mL (ref 3.8–5.2)
Nuc Stress EF: 80 %
Peak HR: 83 {beats}/min
Rest HR: 63 {beats}/min
Rest Nuclear Isotope Dose: 10.7 mCi
SDS: 0
SRS: 8
SSS: 3
ST Depression (mm): 0 mm
Stress Nuclear Isotope Dose: 32.2 mCi
TID: 1

## 2024-03-13 ENCOUNTER — Ambulatory Visit: Payer: Self-pay | Admitting: Physician Assistant

## 2024-03-13 ENCOUNTER — Encounter: Payer: Self-pay | Admitting: Physician Assistant

## 2024-03-13 DIAGNOSIS — D224 Melanocytic nevi of scalp and neck: Secondary | ICD-10-CM | POA: Diagnosis not present

## 2024-03-13 DIAGNOSIS — L728 Other follicular cysts of the skin and subcutaneous tissue: Secondary | ICD-10-CM | POA: Diagnosis not present

## 2024-03-15 ENCOUNTER — Telehealth (HOSPITAL_BASED_OUTPATIENT_CLINIC_OR_DEPARTMENT_OTHER): Payer: Self-pay

## 2024-03-15 NOTE — Telephone Encounter (Signed)
   Pre-operative Risk Assessment    Patient Name: Cassandra Mcconnell  DOB: [birth year] MRN: [id number]   Date of last office visit: 01/02/24 with Dr. Lelon  Date of next office visit: 04/08/24 with Dr. Lelon   Request for Surgical Clearance    Procedure:  left elbow loose body removal, synovectomy  Date of Surgery:  Clearance TBD                                 Surgeon:  Dr. Cristy Surgeon's Group or Practice Name:  [facility] Phone number:  [phone] Fax number:  [phone]   Type of Clearance Requested:   - Medical  - Pharmacy:  Hold Aspirin  not indicated   Type of Anesthesia:  choice   Additional requests/questions:    SignedAugustin JONETTA Mcconnell   03/15/2024, 9:20 AM

## 2024-03-15 NOTE — Telephone Encounter (Signed)
   Name: Cassandra Mcconnell  DOB: 15-Sep-1965  MRN: [id number]  Primary Cardiologist: None  Chart reviewed as part of pre-operative protocol coverage. The patient has an upcoming visit scheduled with Glendia Ferrier, PA on 04/08/2024 at which time clearance can be addressed in case there are any issues that would impact surgical recommendations.  Left elbow loose body removal, synovectomy Is not scheduled until TBD as below. I added preop FYI to appointment note so that provider is aware to address at time of outpatient visit.  Per office protocol the cardiology provider should forward their finalized clearance decision and recommendations regarding antiplatelet therapy to the requesting party below.    I will route this message as FYI to requesting party and remove this message from the preop box as separate preop APP input not needed at this time.   Please call with any questions.  Lamarr Satterfield, NP  03/15/2024, 9:26 AM

## 2024-03-25 DIAGNOSIS — Z6831 Body mass index (BMI) 31.0-31.9, adult: Secondary | ICD-10-CM | POA: Diagnosis not present

## 2024-03-25 DIAGNOSIS — M5412 Radiculopathy, cervical region: Secondary | ICD-10-CM | POA: Diagnosis not present

## 2024-03-25 DIAGNOSIS — E669 Obesity, unspecified: Secondary | ICD-10-CM | POA: Diagnosis not present

## 2024-03-25 DIAGNOSIS — R030 Elevated blood-pressure reading, without diagnosis of hypertension: Secondary | ICD-10-CM | POA: Diagnosis not present

## 2024-03-25 DIAGNOSIS — H109 Unspecified conjunctivitis: Secondary | ICD-10-CM | POA: Diagnosis not present

## 2024-03-26 ENCOUNTER — Encounter: Payer: Self-pay | Admitting: Physician Assistant

## 2024-03-26 ENCOUNTER — Ambulatory Visit (HOSPITAL_COMMUNITY)
Admission: RE | Admit: 2024-03-26 | Discharge: 2024-03-26 | Disposition: A | Source: Ambulatory Visit | Attending: Physician Assistant | Admitting: Physician Assistant

## 2024-03-26 DIAGNOSIS — E782 Mixed hyperlipidemia: Secondary | ICD-10-CM | POA: Insufficient documentation

## 2024-03-26 DIAGNOSIS — R079 Chest pain, unspecified: Secondary | ICD-10-CM | POA: Insufficient documentation

## 2024-03-26 DIAGNOSIS — R072 Precordial pain: Secondary | ICD-10-CM | POA: Diagnosis not present

## 2024-03-26 LAB — ECHOCARDIOGRAM COMPLETE
Area-P 1/2: 3.39 cm2
S' Lateral: 2.5 cm

## 2024-03-29 ENCOUNTER — Encounter: Payer: Self-pay | Admitting: Family Medicine

## 2024-03-29 DIAGNOSIS — R110 Nausea: Secondary | ICD-10-CM

## 2024-04-03 ENCOUNTER — Ambulatory Visit (HOSPITAL_COMMUNITY)

## 2024-04-04 ENCOUNTER — Encounter: Payer: Self-pay | Admitting: *Deleted

## 2024-04-05 ENCOUNTER — Encounter: Payer: Self-pay | Admitting: *Deleted

## 2024-04-07 ENCOUNTER — Encounter: Payer: Self-pay | Admitting: Physician Assistant

## 2024-04-07 NOTE — Progress Notes (Unsigned)
 OFFICE NOTE:    Date:  04/08/2024  ID:  Cassandra Mcconnell, DOB 25-Oct-1965, MRN [id number] PCP: Severa Rock HERO, FNP  [facility] Providers Cardiologist:  None        Chest pain  Pharmacologic MPI 03/12/2024: No ischemia or infarction, EF 80, no TID, CT negative for CAC; low risk TTE 03/26/2024: EF 60-65, no RWMA, GR 1 DD, normal RVSF, trivial MR  Hypertension  Hyperlipidemia  Aortic atherosclerosis (CT 08/2016) FHx of CAD  Obesity  GERD Anxiety Spinal stenosis Obesity s/p gastric sleeve  S/p several back surgeries Bell's Palsy       Discussed the use of AI scribe software for clinical note transcription with the patient, who gave verbal consent to proceed. History of Present Illness Cassandra Mcconnell is a 58 y.o. female for follow up of chest pain. She was evaluated in 12/2023 after being seen in the ED for chest pain. She has a FHx of CAD. CCTA and echocardiogram was arranged. The patient canceled her CT. She did not want a study that uses contrast. A nuclear stress test was arranged and this was low risk. There was no CAC on CT images. Echocardiogram showed normal EF, grade 1 diastolic dysfunction.  Her chest symptoms have improved with Protonix . She associates some of her symptoms with a hiatal hernia. She also experiences shoulder and arm pain due to a pinched nerve in her neck. She is scheduled for left elbow surgery and requires surgical clearance. She reports that she is able to walk up a flight of stairs and walk on level ground. She mentions occasional falls due to back issues.     ROS-See HPI    Studies Reviewed:              Physical Exam:  VS:  BP 122/70 (BP Location: Left Arm, Patient Position: Sitting, Cuff Size: Large)   Pulse 70   Ht 5' 4 (1.626 m)   Wt 180 lb (81.6 kg)   LMP 02/10/2015 Comment: had BSO 2016  SpO2 99%   BMI 30.90 kg/m        Wt Readings from Last 3 Encounters:  04/08/24 180 lb (81.6 kg)  01/02/24 178 lb 3.2 oz (80.8 kg)   12/30/23 175 lb (79.4 kg)    Constitutional:      Appearance: Healthy appearance. Not in distress.  Neck:     Vascular: JVD normal.  Pulmonary:     Breath sounds: Normal breath sounds. No wheezing. No rales.  Cardiovascular:     Normal rate. Regular rhythm.     Murmurs: There is no murmur.       Assessment and Plan:    Assessment & Plan Precordial chest pain Recent nuclear stress test low risk and no CAC noted on CT. Echocardiogram showed normal EF. Her symptoms resolved with PPI therapy. No further CV testing needed. She can follow up with Cardiology as needed.  Primary hypertension Blood pressure controlled without medications.  She lost 90+ pounds and came off of medications sometime ago. Mixed hyperlipidemia LDL in December 2024 was 104.  With a history of aortic atherosclerosis, would push for goal of less than 100.  - Continue Atorvastatin  40 mg once daily - Follow up with primary care for further management.  Preoperative cardiovascular examination [name]'s perioperative risk of a major cardiac event is low at 0.4% according to the Revised Cardiac Risk Index (RCRI).   Her functional capacity is good at 4.31 METs according to the  D[employer] Activity Status Index (DASI). Recommendations: According to ACC/AHA guidelines, no further cardiovascular testing needed.  The patient may proceed to surgery at acceptable risk.   Antiplatelet and/or Anticoagulation Recommendations: Aspirin  can be held for 7 days prior to her surgery.  Please resume Aspirin  post operatively when it is felt to be safe from a bleeding standpoint.  Aortic atherosclerosis Continue Atorvastatin  40 mg once daily, ASA 81 mg once daily.  Gastroesophageal reflux disease without esophagitis Initiation of Pantoprazole  improved/resolved symptoms of chest pain. She has follow up with her general surgeon this week to discuss management of hiatal hernia noted on CT correction images for her stress test.  - Continue  Pantoprazole  40 mg once daily - refill sent today for #90, no refills - She can get future refills with primary care.        Dispo:  Return for follow up as needed.  Signed, Glendia Ferrier, PA-C

## 2024-04-07 NOTE — Assessment & Plan Note (Signed)
 LDL in December 2024 was 104.  With a history of aortic atherosclerosis, would push for goal of less than 100. ***

## 2024-04-08 ENCOUNTER — Encounter: Payer: Self-pay | Admitting: Physician Assistant

## 2024-04-08 ENCOUNTER — Ambulatory Visit: Attending: Physician Assistant | Admitting: Physician Assistant

## 2024-04-08 VITALS — BP 122/70 | HR 70 | Ht 64.0 in | Wt 180.0 lb

## 2024-04-08 DIAGNOSIS — I10 Essential (primary) hypertension: Secondary | ICD-10-CM

## 2024-04-08 DIAGNOSIS — I700 Atherosclerosis of aorta: Secondary | ICD-10-CM

## 2024-04-08 DIAGNOSIS — K219 Gastro-esophageal reflux disease without esophagitis: Secondary | ICD-10-CM | POA: Diagnosis not present

## 2024-04-08 DIAGNOSIS — E782 Mixed hyperlipidemia: Secondary | ICD-10-CM | POA: Diagnosis not present

## 2024-04-08 DIAGNOSIS — R072 Precordial pain: Secondary | ICD-10-CM | POA: Diagnosis not present

## 2024-04-08 DIAGNOSIS — Z01810 Encounter for preprocedural cardiovascular examination: Secondary | ICD-10-CM

## 2024-04-08 MED ORDER — PANTOPRAZOLE SODIUM 40 MG PO TBEC: 40.0000 mg | DELAYED_RELEASE_TABLET | Freq: Every day | ORAL | 0 refills | Status: AC

## 2024-04-08 NOTE — Assessment & Plan Note (Signed)
 Blood pressure controlled without medications.  She lost 90+ pounds and came off of medications sometime ago.

## 2024-04-08 NOTE — Patient Instructions (Signed)
 Medication Instructions:  NO CHANGES *If you need a refill on your cardiac medications before your next appointment, please call your pharmacy*  Lab Work: NO LABS If you have labs (blood work) drawn today and your tests are completely normal, you will receive your results only by: MyChart Message (if you have MyChart) OR A paper copy in the mail If you have any lab test that is abnormal or we need to change your treatment, we will call you to review the results.  Testing/Procedures: NO TESTING  Follow-Up: At [facility], you and your health needs are our priority.  As part of our continuing mission to provide you with exceptional heart care, our providers are all part of one team.  This team includes your primary Cardiologist (physician) and Advanced Practice Providers or APPs (Physician Assistants and Nurse Practitioners) who all work together to provide you with the care you need, when you need it.  Your next appointment:   FOLLOW UP AS NEEDED

## 2024-04-08 NOTE — Assessment & Plan Note (Signed)
 Initiation of Pantoprazole  improved/resolved symptoms of chest pain. She has follow up with her general surgeon this week to discuss management of hiatal hernia noted on CT correction images for her stress test.  - Continue Pantoprazole  40 mg once daily - refill sent today for #90, no refills - She can get future refills with primary care.

## 2024-04-10 ENCOUNTER — Other Ambulatory Visit: Payer: Self-pay | Admitting: General Surgery

## 2024-04-10 DIAGNOSIS — K2100 Gastro-esophageal reflux disease with esophagitis, without bleeding: Secondary | ICD-10-CM

## 2024-04-10 DIAGNOSIS — K449 Diaphragmatic hernia without obstruction or gangrene: Secondary | ICD-10-CM

## 2024-04-10 DIAGNOSIS — Z9884 Bariatric surgery status: Secondary | ICD-10-CM

## 2024-04-16 ENCOUNTER — Ambulatory Visit
Admission: RE | Admit: 2024-04-16 | Discharge: 2024-04-16 | Disposition: A | Discharge status: Home / Self Care | Source: Ambulatory Visit | Attending: General Surgery | Admitting: General Surgery

## 2024-04-16 DIAGNOSIS — K2100 Gastro-esophageal reflux disease with esophagitis, without bleeding: Secondary | ICD-10-CM

## 2024-04-16 DIAGNOSIS — Z9884 Bariatric surgery status: Secondary | ICD-10-CM

## 2024-04-16 DIAGNOSIS — K449 Diaphragmatic hernia without obstruction or gangrene: Secondary | ICD-10-CM

## 2024-04-16 MED ORDER — IOPAMIDOL (ISOVUE-370) INJECTION 76%
80.0000 mL | Freq: Once | INTRAVENOUS | Status: AC | PRN
  Administered 2024-04-16: 80 mL via INTRAVENOUS

## 2024-04-17 ENCOUNTER — Other Ambulatory Visit: Payer: Self-pay | Admitting: Neurosurgery

## 2024-04-22 NOTE — Progress Notes (Signed)
 Surgical Instructions   Your procedure is scheduled on Wednesday May 01, 2024. Report to [facility] Main Entrance A at 6:30 A.M., then check in with the Admitting office. Any questions or running late day of surgery: call [phone]  Questions prior to your surgery date: call [phone], Monday-Friday, 8am-4pm. If you experience any cold or flu symptoms such as cough, fever, chills, shortness of breath, etc. between now and your scheduled surgery, please notify us  at the above number.     Remember:  Do not eat or drink after midnight the night before your surgery  Take these medicines the morning of surgery with A SIP OF WATER   atorvastatin  (LIPITOR)  citalopram  (CELEXA )  lamoTRIgine (LAMICTAL)   May take these medicines IF NEEDED: acetaminophen  (TYLENOL )  Olopatadine  HCl 0.2 % SOLN  pantoprazole  (PROTONIX )  promethazine  (PHENERGAN )  Propylene Glycol (SYSTANE BALANCE) 0.6 % SOLN   nitroGLYCERIN  (NITROSTAT ) If you have to take this medication prior to surgery, please call [phone] and report this to a nurse   PER YOUR CARDIOLOGISTS INSTRUCTIONS, PLEASE HOLD  YOUR ASPIRIN  7 DAYS PRIOR TO SURGERY WITH THE LAST DOSE BEING 04/23/2024.   One week prior to surgery, STOP taking any Aleve, Naproxen, Ibuprofen , Motrin , Advil , Goody's, BC's, all herbal medications, fish oil, and non-prescription vitamins.                     Do NOT Smoke (Tobacco/Vaping) for 24 hours prior to your procedure.  If you use a CPAP at night, you may bring your mask/headgear for your overnight stay.   You will be asked to remove any contacts, glasses, piercing's, hearing aid's, dentures/partials prior to surgery. Please bring cases for these items if needed.    Patients discharged the day of surgery will not be allowed to drive home, and someone needs to stay with them for 24 hours.  SURGICAL WAITING ROOM VISITATION Patients may have no more than 2 support people in the waiting area - these  visitors may rotate.   Pre-op nurse will coordinate an appropriate time for 1 ADULT support person, who may not rotate, to accompany patient in pre-op.  Children under the age of 68 must have an adult with them who is not the patient and must remain in the main waiting area with an adult.  If the patient needs to stay at the hospital during part of their recovery, the visitor guidelines for inpatient rooms apply.  Please refer to the [facility] website for the visitor guidelines for any additional information.   If you received a COVID test during your pre-op visit  it is requested that you wear a mask when out in public, stay away from anyone that may not be feeling well and notify your surgeon if you develop symptoms. If you have been in contact with anyone that has tested positive in the last 10 days please notify you surgeon.      Pre-operative 4 CHG Bathing Instructions   You can play a key role in reducing the risk of infection after surgery. Your skin needs to be as free of germs as possible. You can reduce the number of germs on your skin by washing with CHG (chlorhexidine  gluconate) soap before surgery. CHG is an antiseptic soap that kills germs and continues to kill germs even after washing.   DO NOT use if you have an allergy to chlorhexidine /CHG or antibacterial soaps. If your skin becomes reddened or irritated, stop using the CHG and notify one  of our RNs at [phone].   Please shower with the CHG soap starting 4 days before surgery using the following schedule:     Please keep in mind the following:  DO NOT shave, including legs and underarms, starting the day of your first shower.   Place clean sheets on your bed the day you start using CHG soap. Use a clean washcloth (not used since being washed) for each shower. DO NOT sleep with pets once you start using the CHG.   CHG Shower Instructions:  Wash your face and private area with normal soap. If you choose to wash  your hair, wash first with your normal shampoo.  After you use shampoo/soap, rinse your hair and body thoroughly to remove shampoo/soap residue.  Turn the water  OFF and apply  bottle of CHG soap to a CLEAN washcloth.  Apply CHG soap ONLY FROM YOUR NECK DOWN TO YOUR TOES (washing for 3-5 minutes)  DO NOT use CHG soap on face, private areas, open wounds, or sores.  Pay special attention to the area where your surgery is being performed.  If you are having back surgery, having someone wash your back for you may be helpful. Wait 2 minutes after CHG soap is applied, then you may rinse off the CHG soap.  Pat dry with a clean towel  Put on clean clothes/pajamas   If you choose to wear lotion, please use ONLY the CHG-compatible lotions that are listed below.  Additional instructions for the day of surgery:  If you choose, you may shower the morning of surgery with an antibacterial soap.  DO NOT APPLY any lotions, deodorants or perfumes.   Do not bring valuables to the hospital. [facility] is not responsible for any belongings/valuables. Do not wear nail polish, gel polish, artificial nails, or any other type of covering on natural nails (fingers and toes) Do not wear jewelry or makeup Put on clean/comfortable clothes.  Please brush your teeth.  Ask your nurse before applying any prescription medications to the skin.     CHG Compatible Lotions   Aveeno Moisturizing lotion  Cetaphil Moisturizing Cream  Cetaphil Moisturizing Lotion  Clairol Herbal Essence Moisturizing Lotion, Dry Skin  Clairol Herbal Essence Moisturizing Lotion, Extra Dry Skin  Clairol Herbal Essence Moisturizing Lotion, Normal Skin  Curel Age Defying Therapeutic Moisturizing Lotion with Alpha Hydroxy  Curel Extreme Care Body Lotion  Curel Soothing Hands Moisturizing Hand Lotion  Curel Therapeutic Moisturizing Cream, Fragrance-Free  Curel Therapeutic Moisturizing Lotion, Fragrance-Free  Curel Therapeutic Moisturizing  Lotion, Original Formula  Eucerin Daily Replenishing Lotion  Eucerin Dry Skin Therapy Plus Alpha Hydroxy Crme  Eucerin Dry Skin Therapy Plus Alpha Hydroxy Lotion  Eucerin Original Crme  Eucerin Original Lotion  Eucerin Plus Crme Eucerin Plus Lotion  Eucerin TriLipid Replenishing Lotion  Keri Anti-Bacterial Hand Lotion  Keri Deep Conditioning Original Lotion Dry Skin Formula Softly Scented  Keri Deep Conditioning Original Lotion, Fragrance Free Sensitive Skin Formula  Keri Lotion Fast Absorbing Fragrance Free Sensitive Skin Formula  Keri Lotion Fast Absorbing Softly Scented Dry Skin Formula  Keri Original Lotion  Keri Skin Renewal Lotion Keri Silky Smooth Lotion  Keri Silky Smooth Sensitive Skin Lotion  Nivea Body Creamy Conditioning Oil  Nivea Body Extra Enriched Teacher, Adult Education Moisturizing Lotion Nivea Crme  Nivea Skin Firming Lotion  NutraDerm 30 Skin Lotion  NutraDerm Skin Lotion  NutraDerm Therapeutic Skin Cream  NutraDerm Therapeutic Skin Lotion  ProShield Protective Hand Cream  Provon moisturizing lotion  Please read over the following fact sheets that you were given.

## 2024-04-23 ENCOUNTER — Other Ambulatory Visit: Payer: Self-pay

## 2024-04-23 ENCOUNTER — Encounter (HOSPITAL_COMMUNITY): Payer: Self-pay

## 2024-04-23 ENCOUNTER — Other Ambulatory Visit (HOSPITAL_COMMUNITY): Payer: Self-pay | Admitting: General Surgery

## 2024-04-23 ENCOUNTER — Encounter (HOSPITAL_COMMUNITY)
Admission: RE | Admit: 2024-04-23 | Discharge: 2024-04-23 | Disposition: A | Discharge status: Home / Self Care | Source: Ambulatory Visit | Attending: Neurosurgery | Admitting: Neurosurgery

## 2024-04-23 VITALS — BP 121/71 | HR 65 | Temp 97.7°F | Resp 18 | Ht 64.0 in | Wt 180.0 lb

## 2024-04-23 DIAGNOSIS — Z01812 Encounter for preprocedural laboratory examination: Secondary | ICD-10-CM | POA: Insufficient documentation

## 2024-04-23 DIAGNOSIS — E785 Hyperlipidemia, unspecified: Secondary | ICD-10-CM | POA: Diagnosis not present

## 2024-04-23 DIAGNOSIS — K219 Gastro-esophageal reflux disease without esophagitis: Secondary | ICD-10-CM | POA: Diagnosis not present

## 2024-04-23 DIAGNOSIS — E569 Vitamin deficiency, unspecified: Secondary | ICD-10-CM

## 2024-04-23 DIAGNOSIS — R0789 Other chest pain: Secondary | ICD-10-CM | POA: Diagnosis not present

## 2024-04-23 DIAGNOSIS — Z6831 Body mass index (BMI) 31.0-31.9, adult: Secondary | ICD-10-CM

## 2024-04-23 DIAGNOSIS — Z9884 Bariatric surgery status: Secondary | ICD-10-CM | POA: Insufficient documentation

## 2024-04-23 DIAGNOSIS — I10 Essential (primary) hypertension: Secondary | ICD-10-CM | POA: Insufficient documentation

## 2024-04-23 DIAGNOSIS — Z01818 Encounter for other preprocedural examination: Secondary | ICD-10-CM

## 2024-04-23 DIAGNOSIS — K2100 Gastro-esophageal reflux disease with esophagitis, without bleeding: Secondary | ICD-10-CM

## 2024-04-23 LAB — BASIC METABOLIC PANEL WITH GFR
Anion gap: 10 (ref 5–15)
BUN: 13 mg/dL (ref 6–20)
CO2: 26 mmol/L (ref 22–32)
Calcium: 10.1 mg/dL (ref 8.9–10.3)
Chloride: 104 mmol/L (ref 98–111)
Creatinine, Ser: 0.86 mg/dL (ref 0.44–1.00)
GFR, Estimated: >60 mL/min
Glucose, Bld: 82 mg/dL (ref 70–99)
Potassium: 4.6 mmol/L (ref 3.5–5.1)
Sodium: 140 mmol/L (ref 135–145)

## 2024-04-23 LAB — CBC
HCT: 42.7 % (ref 36.0–46.0)
Hemoglobin: 14.7 g/dL (ref 12.0–15.0)
MCH: 30.5 pg (ref 26.0–34.0)
MCHC: 34.4 g/dL (ref 30.0–36.0)
MCV: 88.6 fL (ref 80.0–100.0)
Platelets: 254 K/uL (ref 150–400)
RBC: 4.82 MIL/uL (ref 3.87–5.11)
RDW: 13 % (ref 11.5–15.5)
WBC: 7.8 K/uL (ref 4.0–10.5)
nRBC: 0 % (ref 0.0–0.2)

## 2024-04-23 LAB — TYPE AND SCREEN
ABO/RH(D): A POS
Antibody Screen: NEGATIVE

## 2024-04-23 LAB — SURGICAL PCR SCREEN
MRSA, PCR: NEGATIVE
Staphylococcus aureus: NEGATIVE

## 2024-04-23 NOTE — Progress Notes (Signed)
 PCP - Rock Bruns, FNP Cardiologist - Glendia Ferrier, PA-C, clearance 04/08/2024  PPM/ICD - denies Device Orders - na Rep Notified - na  Chest x-ray - 12/30/2023 EKG - 12/30/2023 Stress Test - 03/11/2024 ECHO - 03/26/2024 Cardiac Cath -   Sleep Study - denies CPAP - na  Non-diabetic  Blood Thinner Instructions:denies Aspirin  Instructions: Hold 7 days  ERAS Protcol -NPO  Anesthesia review: Yes. HTN, cardiac clearance  Patient denies shortness of breath, fever, cough and chest pain at PAT appointment   All instructions explained to the patient, with a verbal understanding of the material. Patient agrees to go over the instructions while at home for a better understanding. Patient also instructed to self quarantine after being tested for COVID-19. The opportunity to ask questions was provided.

## 2024-04-24 NOTE — Progress Notes (Signed)
 Anesthesia Chart Review:  58 year old female follows with cardiology for history of HTN, HLD, atypical chest pain.  Stress test 02/2024 negative for ischemia, low risk.  Echo 03/2024 with EF 60 to 65%, normal wall motion, grade 1 DD, normal RV, no significant valvular abnormalities.  Seen by Glendia Ferrier, PA-C 04/08/2024 for preop evaluation.  Per note, Ms. Lyerly's perioperative risk of a major cardiac event is low at 0.4% according to the Revised Cardiac Risk Index (RCRI).   Her functional capacity is good at 4.31 METs according to the Duke Activity Status Index (DASI). Recommendations: According to ACC/AHA guidelines, no further cardiovascular testing needed.  The patient may proceed to surgery at acceptable risk.   Antiplatelet and/or Anticoagulation Recommendations: Aspirin  can be held for 7 days prior to her surgery.  Please resume Aspirin  post operatively when it is felt to be safe from a bleeding standpoint.  Other pertinent history includes PON V, GERD on PPI, moderate-sized hiatal hernia, anxiety, obesity s/p gastric sleeve  Preop labs reviewed, WNL.  TTE 03/26/2024:  1. Left ventricular ejection fraction, by estimation, is 60 to 65%. The  left ventricle has normal function. The left ventricle has no regional  wall motion abnormalities. Left ventricular diastolic parameters are  consistent with Grade I diastolic  dysfunction (impaired relaxation).   2. Right ventricular systolic function is normal. The right ventricular  size is normal.   3. The mitral valve is normal in structure. Trivial mitral valve  regurgitation. No evidence of mitral stenosis.   4. The aortic valve is tricuspid. Aortic valve regurgitation is not  visualized. No aortic stenosis is present.   5. The inferior vena cava is normal in size with greater than 50%  respiratory variability, suggesting right atrial pressure of 3 mmHg.   Nuclear stress test 03/11/2024:   A pharmacological stress test was performed using  IV Lexiscan  0.4mg  over 10 seconds performed without concurrent submaximal exercise. The patient reported dyspnea, dizziness and chest pain during the stress test. Onset of symptoms occurred at stage 0.5 of the protocol. Symptoms began at minute 30 sec during stress and ended at minute 3 during recovery. Normal blood pressure and normal heart rate response noted during stress. Heart rate recovery was normal.   No ST deviation was noted. ECG was interpretable and without significant changes. The ECG was not diagnostic due to pharmacologic protocol.   LV perfusion is normal. There is no evidence of ischemia. There is no evidence of infarction.   Left ventricular function is normal. Nuclear stress EF: 80%. The left ventricular ejection fraction is hyperdynamic (>65%). End diastolic cavity size is normal. End systolic cavity size is normal. No evidence of transient ischemic dilation (TID) noted.   CT images were obtained for attenuation correction and were examined for the presence of coronary calcium  when appropriate.   Coronary calcium  was absent on the attenuation correction CT images.   Prior study not available for comparison.   The study is normal. The study is low risk.    Lynwood Geofm RIGGERS [facility] Short Stay Center/Anesthesiology Phone [phone] 04/24/2024 2:19 PM

## 2024-04-24 NOTE — Anesthesia Preprocedure Evaluation (Addendum)
 "                                  Anesthesia Evaluation  Patient identified by MRN, date of birth, ID band Patient awake    Reviewed: Allergy & Precautions, H&P , NPO status , Patient's Chart, lab work & pertinent test results  History of Anesthesia Complications (+) PONV and history of anesthetic complications  Airway Mallampati: II  TM Distance: >3 FB Neck ROM: Full    Dental no notable dental hx.    Pulmonary neg pulmonary ROS   Pulmonary exam normal breath sounds clear to auscultation       Cardiovascular hypertension, negative cardio ROS Normal cardiovascular exam Rhythm:Regular Rate:Normal     Neuro/Psych  Headaches, neg Seizures PSYCHIATRIC DISORDERS Anxiety Depression    Radiculopathy of cervical region  Neuromuscular disease    GI/Hepatic Neg liver ROS, hiatal hernia,GERD  ,,  Endo/Other  negative endocrine ROS    Renal/GU negative Renal ROS  negative genitourinary   Musculoskeletal  (+) Arthritis ,    Abdominal   Peds negative pediatric ROS (+)  Hematology negative hematology ROS (+)   Anesthesia Other Findings   Reproductive/Obstetrics negative OB ROS                              Anesthesia Physical Anesthesia Plan  ASA: 3  Anesthesia Plan: General   Post-op Pain Management: Tylenol  PO (pre-op)*   Induction: Intravenous  PONV Risk Score and Plan: 4 or greater and Ondansetron , Dexamethasone , Midazolam  and Treatment may vary due to age or medical condition  Airway Management Planned: Oral ETT  Additional Equipment: None  Intra-op Plan:   Post-operative Plan: Extubation in OR  Informed Consent: I have reviewed the patients History and Physical, chart, labs and discussed the procedure including the risks, benefits and alternatives for the proposed anesthesia with the patient or authorized representative who has indicated his/her understanding and acceptance.     Dental advisory given  Plan  Discussed with: CRNA  Anesthesia Plan Comments: (PAT note by Lynwood Hope, PA-C:  58 year old female follows with cardiology for history of HTN, HLD, atypical chest pain.  Stress test 02/2024 negative for ischemia, low risk.  Echo 03/2024 with EF 60 to 65%, normal wall motion, grade 1 DD, normal RV, no significant valvular abnormalities.  Seen by Glendia Ferrier, PA-C 04/08/2024 for preop evaluation.  Per note, Ms. Halliday's perioperative risk of a major cardiac event is low at 0.4% according to the Revised Cardiac Risk Index (RCRI).   Her functional capacity is good at 4.31 METs according to the Duke Activity Status Index (DASI). Recommendations: According to ACC/AHA guidelines, no further cardiovascular testing needed.  The patient may proceed to surgery at acceptable risk.   Antiplatelet and/or Anticoagulation Recommendations: Aspirin  can be held for 7 days prior to her surgery.  Please resume Aspirin  post operatively when it is felt to be safe from a bleeding standpoint.  Other pertinent history includes PON V, GERD on PPI, moderate-sized hiatal hernia, anxiety, obesity s/p gastric sleeve  Preop labs reviewed, WNL.  TTE 03/26/2024:  1. Left ventricular ejection fraction, by estimation, is 60 to 65%. The  left ventricle has normal function. The left ventricle has no regional  wall motion abnormalities. Left ventricular diastolic parameters are  consistent with Grade I diastolic  dysfunction (impaired relaxation).   2. Right ventricular  systolic function is normal. The right ventricular  size is normal.   3. The mitral valve is normal in structure. Trivial mitral valve  regurgitation. No evidence of mitral stenosis.   4. The aortic valve is tricuspid. Aortic valve regurgitation is not  visualized. No aortic stenosis is present.   5. The inferior vena cava is normal in size with greater than 50%  respiratory variability, suggesting right atrial pressure of 3 mmHg.   Nuclear stress test  03/11/2024:   A pharmacological stress test was performed using IV Lexiscan  0.4mg  over 10 seconds performed without concurrent submaximal exercise. The patient reported dyspnea, dizziness and chest pain during the stress test. Onset of symptoms occurred at stage 0.5 of the protocol. Symptoms began at minute 30 sec during stress and ended at minute 3 during recovery. Normal blood pressure and normal heart rate response noted during stress. Heart rate recovery was normal.   No ST deviation was noted. ECG was interpretable and without significant changes. The ECG was not diagnostic due to pharmacologic protocol.   LV perfusion is normal. There is no evidence of ischemia. There is no evidence of infarction.   Left ventricular function is normal. Nuclear stress EF: 80%. The left ventricular ejection fraction is hyperdynamic (>65%). End diastolic cavity size is normal. End systolic cavity size is normal. No evidence of transient ischemic dilation (TID) noted.   CT images were obtained for attenuation correction and were examined for the presence of coronary calcium  when appropriate.   Coronary calcium  was absent on the attenuation correction CT images.   Prior study not available for comparison.   The study is normal. The study is low risk.   )         Anesthesia Quick Evaluation  "

## 2024-04-26 ENCOUNTER — Ambulatory Visit (HOSPITAL_COMMUNITY)
Admission: RE | Admit: 2024-04-26 | Discharge: 2024-04-26 | Disposition: A | Source: Ambulatory Visit | Attending: General Surgery

## 2024-04-26 DIAGNOSIS — E661 Drug-induced obesity: Secondary | ICD-10-CM | POA: Diagnosis present

## 2024-04-26 DIAGNOSIS — Z6831 Body mass index (BMI) 31.0-31.9, adult: Secondary | ICD-10-CM | POA: Diagnosis present

## 2024-04-26 DIAGNOSIS — K2100 Gastro-esophageal reflux disease with esophagitis, without bleeding: Secondary | ICD-10-CM | POA: Diagnosis present

## 2024-04-26 DIAGNOSIS — Z9884 Bariatric surgery status: Secondary | ICD-10-CM | POA: Insufficient documentation

## 2024-04-26 DIAGNOSIS — E66811 Obesity, class 1: Secondary | ICD-10-CM | POA: Insufficient documentation

## 2024-04-26 DIAGNOSIS — E569 Vitamin deficiency, unspecified: Secondary | ICD-10-CM | POA: Insufficient documentation

## 2024-04-30 ENCOUNTER — Encounter: Admitting: Dietician

## 2024-05-01 ENCOUNTER — Encounter (HOSPITAL_COMMUNITY)
Admission: RE | Disposition: A | Discharge status: Home / Self Care | Payer: Self-pay | Source: Home / Self Care | Attending: Neurosurgery

## 2024-05-01 ENCOUNTER — Inpatient Hospital Stay (HOSPITAL_COMMUNITY)
Admission: RE | Admit: 2024-05-01 | Discharge: 2024-05-03 | DRG: 472 | Disposition: A | Discharge status: Home / Self Care | Attending: Neurosurgery | Admitting: Neurosurgery

## 2024-05-01 ENCOUNTER — Inpatient Hospital Stay (HOSPITAL_COMMUNITY)

## 2024-05-01 ENCOUNTER — Encounter (HOSPITAL_COMMUNITY): Payer: Self-pay | Admitting: Physician Assistant

## 2024-05-01 DIAGNOSIS — Y838 Other surgical procedures as the cause of abnormal reaction of the patient, or of later complication, without mention of misadventure at the time of the procedure: Secondary | ICD-10-CM | POA: Diagnosis not present

## 2024-05-01 DIAGNOSIS — E785 Hyperlipidemia, unspecified: Secondary | ICD-10-CM | POA: Diagnosis present

## 2024-05-01 DIAGNOSIS — Z8249 Family history of ischemic heart disease and other diseases of the circulatory system: Secondary | ICD-10-CM | POA: Diagnosis not present

## 2024-05-01 DIAGNOSIS — Z881 Allergy status to other antibiotic agents status: Secondary | ICD-10-CM

## 2024-05-01 DIAGNOSIS — I10 Essential (primary) hypertension: Secondary | ICD-10-CM

## 2024-05-01 DIAGNOSIS — Z8042 Family history of malignant neoplasm of prostate: Secondary | ICD-10-CM

## 2024-05-01 DIAGNOSIS — F418 Other specified anxiety disorders: Secondary | ICD-10-CM | POA: Diagnosis not present

## 2024-05-01 DIAGNOSIS — M4802 Spinal stenosis, cervical region: Secondary | ICD-10-CM | POA: Diagnosis present

## 2024-05-01 DIAGNOSIS — G9741 Accidental puncture or laceration of dura during a procedure: Secondary | ICD-10-CM | POA: Diagnosis not present

## 2024-05-01 DIAGNOSIS — Z79899 Other long term (current) drug therapy: Secondary | ICD-10-CM | POA: Diagnosis not present

## 2024-05-01 DIAGNOSIS — Z823 Family history of stroke: Secondary | ICD-10-CM

## 2024-05-01 DIAGNOSIS — Z833 Family history of diabetes mellitus: Secondary | ICD-10-CM | POA: Diagnosis not present

## 2024-05-01 DIAGNOSIS — E7800 Pure hypercholesterolemia, unspecified: Secondary | ICD-10-CM

## 2024-05-01 DIAGNOSIS — Z9049 Acquired absence of other specified parts of digestive tract: Secondary | ICD-10-CM | POA: Diagnosis not present

## 2024-05-01 DIAGNOSIS — Z7982 Long term (current) use of aspirin: Secondary | ICD-10-CM | POA: Diagnosis not present

## 2024-05-01 DIAGNOSIS — Z885 Allergy status to narcotic agent status: Secondary | ICD-10-CM

## 2024-05-01 DIAGNOSIS — M4722 Other spondylosis with radiculopathy, cervical region: Secondary | ICD-10-CM | POA: Diagnosis present

## 2024-05-01 DIAGNOSIS — Z83438 Family history of other disorder of lipoprotein metabolism and other lipidemia: Secondary | ICD-10-CM

## 2024-05-01 DIAGNOSIS — Z886 Allergy status to analgesic agent status: Secondary | ICD-10-CM | POA: Diagnosis not present

## 2024-05-01 DIAGNOSIS — K219 Gastro-esophageal reflux disease without esophagitis: Secondary | ICD-10-CM | POA: Diagnosis present

## 2024-05-01 DIAGNOSIS — Z882 Allergy status to sulfonamides status: Secondary | ICD-10-CM

## 2024-05-01 DIAGNOSIS — Z888 Allergy status to other drugs, medicaments and biological substances status: Secondary | ICD-10-CM

## 2024-05-01 DIAGNOSIS — Z825 Family history of asthma and other chronic lower respiratory diseases: Secondary | ICD-10-CM

## 2024-05-01 DIAGNOSIS — Z981 Arthrodesis status: Secondary | ICD-10-CM | POA: Diagnosis not present

## 2024-05-01 DIAGNOSIS — M5412 Radiculopathy, cervical region: Secondary | ICD-10-CM | POA: Diagnosis present

## 2024-05-01 HISTORY — PX: POSTERIOR CERVICAL FUSION/FORAMINOTOMY: SHX5038

## 2024-05-01 LAB — HEMOGLOBIN A1C
Hgb A1c MFr Bld: 5.1 % (ref 4.8–5.6)
Mean Plasma Glucose: 99.67 mg/dL

## 2024-05-01 MED ORDER — FENTANYL CITRATE (PF) 100 MCG/2ML IJ SOLN
INTRAMUSCULAR | Status: AC
  Filled 2024-05-01: qty 2

## 2024-05-01 MED ORDER — 0.9 % SODIUM CHLORIDE (POUR BTL) OPTIME
TOPICAL | Status: DC | PRN
  Administered 2024-05-01: 1000 mL

## 2024-05-01 MED ORDER — ACETAMINOPHEN 10 MG/ML IV SOLN
INTRAVENOUS | Status: AC
  Filled 2024-05-01: qty 100

## 2024-05-01 MED ORDER — OXYCODONE HCL 5 MG/5ML PO SOLN: 5.0000 mg | Freq: Once | ORAL | Status: DC | PRN

## 2024-05-01 MED ORDER — BACITRACIN ZINC 500 UNIT/GM EX OINT
TOPICAL_OINTMENT | CUTANEOUS | Status: AC
  Filled 2024-05-01: qty 28.35

## 2024-05-01 MED ORDER — LIDOCAINE 2% (20 MG/ML) 5 ML SYRINGE
INTRAMUSCULAR | Status: DC | PRN
  Administered 2024-05-01: 100 mg via INTRAVENOUS

## 2024-05-01 MED ORDER — DEXAMETHASONE SOD PHOSPHATE PF 10 MG/ML IJ SOLN
INTRAMUSCULAR | Status: DC | PRN
  Administered 2024-05-01: 4 mg via INTRAVENOUS

## 2024-05-01 MED ORDER — POLYVINYL ALCOHOL 1.4 % OP SOLN: 1.0000 [drp] | Freq: Every day | OPHTHALMIC | Status: DC | PRN

## 2024-05-01 MED ORDER — SODIUM CHLORIDE 0.9% FLUSH
3.0000 mL | Freq: Two times a day (BID) | INTRAVENOUS | Status: DC
  Administered 2024-05-01 – 2024-05-03 (×2): 3 mL via INTRAVENOUS

## 2024-05-01 MED ORDER — ACETAMINOPHEN 10 MG/ML IV SOLN
1000.0000 mg | Freq: Once | INTRAVENOUS | Status: DC | PRN
  Administered 2024-05-01: 1000 mg via INTRAVENOUS

## 2024-05-01 MED ORDER — DROPERIDOL 2.5 MG/ML IJ SOLN
0.6250 mg | Freq: Once | INTRAMUSCULAR | Status: AC | PRN
  Administered 2024-05-01: 0.625 mg via INTRAVENOUS

## 2024-05-01 MED ORDER — CHLORHEXIDINE GLUCONATE 0.12 % MT SOLN: 15.0000 mL | Freq: Once | OROMUCOSAL | Status: AC

## 2024-05-01 MED ORDER — PROPOFOL 10 MG/ML IV BOLUS
INTRAVENOUS | Status: AC
  Filled 2024-05-01: qty 20

## 2024-05-01 MED ORDER — BUPIVACAINE HCL (PF) 0.25 % IJ SOLN
INTRAMUSCULAR | Status: DC | PRN
  Administered 2024-05-01 (×2): 5 mL

## 2024-05-01 MED ORDER — ALBUMIN HUMAN 5 % IV SOLN: INTRAVENOUS | Status: DC | PRN

## 2024-05-01 MED ORDER — PHENYLEPHRINE 80 MCG/ML (10ML) SYRINGE FOR IV PUSH (FOR BLOOD PRESSURE SUPPORT)
PREFILLED_SYRINGE | INTRAVENOUS | Status: DC | PRN
  Administered 2024-05-01: 160 ug via INTRAVENOUS

## 2024-05-01 MED ORDER — HYDROMORPHONE HCL 1 MG/ML IJ SOLN
0.5000 mg | INTRAMUSCULAR | Status: DC | PRN
  Administered 2024-05-01: 0.5 mg via INTRAVENOUS
  Filled 2024-05-01: qty 0.5

## 2024-05-01 MED ORDER — CEFAZOLIN SODIUM-DEXTROSE 2-4 GM/100ML-% IV SOLN
2.0000 g | INTRAVENOUS | Status: AC
  Administered 2024-05-01: 2 g via INTRAVENOUS

## 2024-05-01 MED ORDER — CYCLOBENZAPRINE HCL 10 MG PO TABS
10.0000 mg | ORAL_TABLET | Freq: Three times a day (TID) | ORAL | Status: DC | PRN
  Administered 2024-05-01 – 2024-05-02 (×2): 10 mg via ORAL
  Filled 2024-05-01 (×2): qty 1

## 2024-05-01 MED ORDER — NITROGLYCERIN 0.4 MG SL SUBL: 0.4000 mg | SUBLINGUAL_TABLET | SUBLINGUAL | Status: DC | PRN

## 2024-05-01 MED ORDER — SUGAMMADEX SODIUM 200 MG/2ML IV SOLN
INTRAVENOUS | Status: DC | PRN
  Administered 2024-05-01: 170 mg via INTRAVENOUS

## 2024-05-01 MED ORDER — MIDAZOLAM HCL (PF) 2 MG/2ML IJ SOLN
INTRAMUSCULAR | Status: DC | PRN
  Administered 2024-05-01: 2 mg via INTRAVENOUS

## 2024-05-01 MED ORDER — ACETAMINOPHEN 650 MG RE SUPP: 650.0000 mg | RECTAL | Status: DC | PRN

## 2024-05-01 MED ORDER — CHLORHEXIDINE GLUCONATE CLOTH 2 % EX PADS
6.0000 | MEDICATED_PAD | Freq: Once | CUTANEOUS | Status: AC
  Administered 2024-05-01: 6 via TOPICAL

## 2024-05-01 MED ORDER — THROMBIN 20000 UNITS EX SOLR
CUTANEOUS | Status: DC | PRN
  Administered 2024-05-01: 20 mL via TOPICAL

## 2024-05-01 MED ORDER — PANTOPRAZOLE SODIUM 40 MG PO TBEC
40.0000 mg | DELAYED_RELEASE_TABLET | Freq: Every day | ORAL | Status: DC
  Administered 2024-05-02 – 2024-05-03 (×2): 40 mg via ORAL
  Filled 2024-05-01 (×2): qty 1

## 2024-05-01 MED ORDER — PROMETHAZINE HCL 25 MG PO TABS: 25.0000 mg | ORAL_TABLET | Freq: Three times a day (TID) | ORAL | Status: DC | PRN

## 2024-05-01 MED ORDER — HYDROMORPHONE HCL 2 MG PO TABS: 2.0000 mg | ORAL_TABLET | ORAL | Status: DC | PRN

## 2024-05-01 MED ORDER — DROPERIDOL 2.5 MG/ML IJ SOLN
INTRAMUSCULAR | Status: AC
  Filled 2024-05-01: qty 2

## 2024-05-01 MED ORDER — ONDANSETRON HCL 4 MG PO TABS: 4.0000 mg | ORAL_TABLET | Freq: Four times a day (QID) | ORAL | Status: DC | PRN

## 2024-05-01 MED ORDER — SODIUM CHLORIDE 0.9% FLUSH: 3.0000 mL | INTRAVENOUS | Status: DC | PRN

## 2024-05-01 MED ORDER — ROCURONIUM BROMIDE 10 MG/ML (PF) SYRINGE
PREFILLED_SYRINGE | INTRAVENOUS | Status: DC | PRN
  Administered 2024-05-01: 50 mg via INTRAVENOUS
  Administered 2024-05-01: 30 mg via INTRAVENOUS
  Administered 2024-05-01: 50 mg via INTRAVENOUS

## 2024-05-01 MED ORDER — CEFAZOLIN SODIUM-DEXTROSE 2-4 GM/100ML-% IV SOLN
INTRAVENOUS | Status: AC
  Filled 2024-05-01: qty 100

## 2024-05-01 MED ORDER — HYDROMORPHONE HCL 2 MG PO TABS
2.0000 mg | ORAL_TABLET | ORAL | Status: DC | PRN
  Administered 2024-05-01 – 2024-05-03 (×8): 2 mg via ORAL
  Filled 2024-05-01 (×9): qty 1

## 2024-05-01 MED ORDER — CHLORHEXIDINE GLUCONATE 0.12 % MT SOLN
OROMUCOSAL | Status: AC
  Administered 2024-05-01: 15 mL via OROMUCOSAL
  Filled 2024-05-01: qty 15

## 2024-05-01 MED ORDER — PANTOPRAZOLE SODIUM 40 MG IV SOLR: 40.0000 mg | Freq: Every day | INTRAVENOUS | Status: DC

## 2024-05-01 MED ORDER — FENTANYL CITRATE (PF) 250 MCG/5ML IJ SOLN
INTRAMUSCULAR | Status: AC
  Filled 2024-05-01: qty 5

## 2024-05-01 MED ORDER — ATORVASTATIN CALCIUM 40 MG PO TABS
40.0000 mg | ORAL_TABLET | Freq: Every day | ORAL | Status: DC
  Administered 2024-05-01 – 2024-05-03 (×3): 40 mg via ORAL
  Filled 2024-05-01 (×3): qty 1

## 2024-05-01 MED ORDER — PROPOFOL 10 MG/ML IV BOLUS
INTRAVENOUS | Status: DC | PRN
  Administered 2024-05-01: 150 mg via INTRAVENOUS

## 2024-05-01 MED ORDER — THROMBIN 5000 UNITS EX KIT
PACK | CUTANEOUS | Status: AC
  Filled 2024-05-01: qty 1

## 2024-05-01 MED ORDER — PHENYLEPHRINE HCL-NACL 20-0.9 MG/250ML-% IV SOLN
INTRAVENOUS | Status: DC | PRN
  Administered 2024-05-01: 30 ug/min via INTRAVENOUS

## 2024-05-01 MED ORDER — LIDOCAINE-EPINEPHRINE 1 %-1:100000 IJ SOLN
INTRAMUSCULAR | Status: AC
  Filled 2024-05-01: qty 1

## 2024-05-01 MED ORDER — ALUM & MAG HYDROXIDE-SIMETH 200-200-20 MG/5ML PO SUSP: 30.0000 mL | Freq: Four times a day (QID) | ORAL | Status: DC | PRN

## 2024-05-01 MED ORDER — ASPIRIN 81 MG PO TBEC
81.0000 mg | DELAYED_RELEASE_TABLET | Freq: Every day | ORAL | Status: DC
  Administered 2024-05-02 – 2024-05-03 (×2): 81 mg via ORAL
  Filled 2024-05-01 (×2): qty 1

## 2024-05-01 MED ORDER — FENTANYL CITRATE (PF) 250 MCG/5ML IJ SOLN
INTRAMUSCULAR | Status: DC | PRN
  Administered 2024-05-01: 100 ug via INTRAVENOUS
  Administered 2024-05-01: 50 ug via INTRAVENOUS

## 2024-05-01 MED ORDER — BUPIVACAINE HCL (PF) 0.25 % IJ SOLN
INTRAMUSCULAR | Status: AC
  Filled 2024-05-01: qty 30

## 2024-05-01 MED ORDER — CITALOPRAM HYDROBROMIDE 20 MG PO TABS
40.0000 mg | ORAL_TABLET | Freq: Every day | ORAL | Status: DC
  Administered 2024-05-02 – 2024-05-03 (×2): 40 mg via ORAL
  Filled 2024-05-01 (×2): qty 2

## 2024-05-01 MED ORDER — OLOPATADINE HCL 0.1 % OP SOLN
1.0000 [drp] | Freq: Two times a day (BID) | OPHTHALMIC | Status: DC
  Administered 2024-05-01 – 2024-05-03 (×2): 1 [drp] via OPHTHALMIC
  Filled 2024-05-01: qty 5

## 2024-05-01 MED ORDER — SODIUM CHLORIDE 0.9 % IV SOLN: 250.0000 mL | INTRAVENOUS | Status: AC

## 2024-05-01 MED ORDER — BACITRACIN ZINC 500 UNIT/GM EX OINT
TOPICAL_OINTMENT | CUTANEOUS | Status: DC | PRN
  Administered 2024-05-01: 1 via TOPICAL

## 2024-05-01 MED ORDER — LACTATED RINGERS IV SOLN: INTRAVENOUS | Status: DC

## 2024-05-01 MED ORDER — ONDANSETRON HCL 4 MG/2ML IJ SOLN: 4.0000 mg | Freq: Four times a day (QID) | INTRAMUSCULAR | Status: DC | PRN

## 2024-05-01 MED ORDER — DIPHENHYDRAMINE HCL 50 MG/ML IJ SOLN
INTRAMUSCULAR | Status: DC | PRN
  Administered 2024-05-01: 12.5 mg via INTRAVENOUS

## 2024-05-01 MED ORDER — MIDAZOLAM HCL 2 MG/2ML IJ SOLN
INTRAMUSCULAR | Status: AC
  Filled 2024-05-01: qty 2

## 2024-05-01 MED ORDER — LAMOTRIGINE 25 MG PO TABS
25.0000 mg | ORAL_TABLET | Freq: Every day | ORAL | Status: DC
  Administered 2024-05-02 – 2024-05-03 (×2): 25 mg via ORAL
  Filled 2024-05-01 (×2): qty 1

## 2024-05-01 MED ORDER — OXYCODONE HCL 5 MG PO TABS: 5.0000 mg | ORAL_TABLET | Freq: Once | ORAL | Status: DC | PRN

## 2024-05-01 MED ORDER — SCOPOLAMINE 1 MG/3DAYS TD PT72: MEDICATED_PATCH | TRANSDERMAL | Status: DC | PRN

## 2024-05-01 MED ORDER — ACETAMINOPHEN 325 MG PO TABS
650.0000 mg | ORAL_TABLET | ORAL | Status: DC | PRN
  Administered 2024-05-02: 650 mg via ORAL
  Filled 2024-05-01: qty 2

## 2024-05-01 MED ORDER — THROMBIN 20000 UNITS EX SOLR
CUTANEOUS | Status: AC
  Filled 2024-05-01: qty 20000

## 2024-05-01 MED ORDER — FENTANYL CITRATE (PF) 100 MCG/2ML IJ SOLN
25.0000 ug | INTRAMUSCULAR | Status: DC | PRN
  Administered 2024-05-01 (×2): 25 ug via INTRAVENOUS

## 2024-05-01 MED ORDER — MENTHOL 3 MG MT LOZG: 1.0000 | LOZENGE | OROMUCOSAL | Status: DC | PRN

## 2024-05-01 MED ORDER — LIDOCAINE-EPINEPHRINE 1 %-1:100000 IJ SOLN
INTRAMUSCULAR | Status: DC | PRN
  Administered 2024-05-01 (×2): 5 mL

## 2024-05-01 MED ORDER — THROMBIN 5000 UNITS EX SOLR
OROMUCOSAL | Status: DC | PRN
  Administered 2024-05-01: 5 mL via TOPICAL

## 2024-05-01 MED ORDER — HYDROCODONE-ACETAMINOPHEN 5-325 MG PO TABS: 2.0000 | ORAL_TABLET | ORAL | Status: DC | PRN

## 2024-05-01 MED ORDER — ACETAMINOPHEN 500 MG PO TABS: 1000.0000 mg | ORAL_TABLET | Freq: Four times a day (QID) | ORAL | Status: DC | PRN

## 2024-05-01 MED ORDER — PHENOL 1.4 % MT LIQD: 1.0000 | OROMUCOSAL | Status: DC | PRN

## 2024-05-01 MED ORDER — CEFAZOLIN SODIUM-DEXTROSE 2-4 GM/100ML-% IV SOLN
2.0000 g | Freq: Three times a day (TID) | INTRAVENOUS | Status: DC
  Administered 2024-05-01 – 2024-05-02 (×2): 2 g via INTRAVENOUS
  Filled 2024-05-01 (×2): qty 100

## 2024-05-01 MED ORDER — CHLORHEXIDINE GLUCONATE CLOTH 2 % EX PADS: 6.0000 | MEDICATED_PAD | Freq: Once | CUTANEOUS | Status: DC

## 2024-05-01 MED ORDER — ORAL CARE MOUTH RINSE: 15.0000 mL | Freq: Once | OROMUCOSAL | Status: AC

## 2024-05-01 NOTE — Anesthesia Postprocedure Evaluation (Signed)
"   Anesthesia Post Note  Patient: KRYSTLE POLCYN  Procedure(s) Performed: POSTERIOR CERVICAL FUSION/FORAMINOTOMY CERVICAL SEVEN-THORACIC ONE; LAMINECTOMY, LEFT (Left)     Patient location during evaluation: PACU Anesthesia Type: General Level of consciousness: awake and alert Pain management: pain level controlled Vital Signs Assessment: post-procedure vital signs reviewed and stable Respiratory status: spontaneous breathing, nonlabored ventilation, respiratory function stable and patient connected to nasal cannula oxygen Cardiovascular status: blood pressure returned to baseline and stable Postop Assessment: no apparent nausea or vomiting Anesthetic complications: no   No notable events documented.  Last Vitals:  Vitals:   05/01/24 1415 05/01/24 1436  BP: 136/75 (!) 149/74  Pulse: 72 77  Resp: 16 16  Temp: 36.9 C   SpO2: 95% 95%    Last Pain:  Vitals:   05/01/24 1445  TempSrc:   PainSc: 3                  Tavi Gaughran JONELLE Peoples      "

## 2024-05-01 NOTE — Transfer of Care (Signed)
 Immediate Anesthesia Transfer of Care Note  Patient: Cassandra Mcconnell  Procedure(s) Performed: POSTERIOR CERVICAL FUSION/FORAMINOTOMY CERVICAL SEVEN-THORACIC ONE; LAMINECTOMY, LEFT (Left)  Patient Location: PACU  Anesthesia Type:General  Level of Consciousness: awake, alert , and oriented  Airway & Oxygen Therapy: Patient Spontanous Breathing and Patient connected to nasal cannula oxygen  Post-op Assessment: Report given to RN and Post -op Vital signs reviewed and stable  Post vital signs: Reviewed and stable  Last Vitals:  Vitals Value Taken Time  BP 162/114 05/01/24 12:05  Temp    Pulse 93 05/01/24 12:08  Resp 12 05/01/24 12:08  SpO2 97 % 05/01/24 12:08  Vitals shown include unfiled device data.  Last Pain:  Vitals:   05/01/24 0759  TempSrc:   PainSc: 0-No pain         Complications: No notable events documented.

## 2024-05-01 NOTE — H&P (Signed)
 Cassandra Mcconnell is an 59 y.o. female.   Chief Complaint: Neck pain left greater than right arm pain C8 radiculopathy HPI: 59 year old female previous history of C4-C7 ACDF presents with progressive worsening neck pain and left greater than right arm pain that follows a C8 nerve root pattern.  Workup with MRI scan x-rays revealed spondylosis at C7-T1 spine refractory to all forms conservative treatment.  Due to her progression of clinical syndrome imaging findings failed conservative treatment I recommended foraminotomies at C7-T1 with posterior cervical fusion with pedicle screws versus lateral mass screws and posterolateral arthrodesis at that level.  I have extensively gone over the risks and benefits of the operation with the patient as well as perioperative course expectations of outcome and alternatives to surgery and she understood and agreed to proceed forward.  Past Medical History:  Diagnosis Date   Ankle fracture    left   Anxiety    Arthritis    Back pain    Bell's palsy    Left side, 2009, 2017   Chest pain    Pharmacologic MPI 03/12/2024: No ischemia or infarction, EF 80, no TID, CT negative for CAC; low risk // TTE 03/26/2024: EF 60-65, no RWMA, GR 1 DD, normal RVSF, trivial MR   Depression    Diverticulitis    Dyspnea    GERD (gastroesophageal reflux disease)    Headache    History of hiatal hernia    Hyperlipidemia    Hypertension    No longer on meds - lost 90+ lbs   Postoperative nausea 04/21/2016   Toe fracture    left great toe   Wears glasses     Past Surgical History:  Procedure Laterality Date   BACK SURGERY  2004   l4/5 fusion   BREAST REDUCTION SURGERY Bilateral 08/08/2014   Procedure: BILATERAL BREAST REDUCTION  (BREAST);  Surgeon: Earlis Ranks, MD;  Location: [facility];  Service: Plastics;  Laterality: Bilateral;   CATARACT EXTRACTION Bilateral    CERVICAL DISCECTOMY  12/12/2018   3 level    CHOLECYSTECTOMY     1998   INJECTION  KNEE Left    KNEE ARTHROSCOPY     LAPAROSCOPIC BILATERAL SALPINGO OOPHERECTOMY Bilateral 03/11/2015   Procedure: LAPAROSCOPIC BILATERAL SALPINGO OOPHORECTOMY;  Surgeon: Vonn VEAR Inch, MD;  Location: [facility];  Service: Gynecology;  Laterality: Bilateral;   LAPAROSCOPIC GASTRIC SLEEVE RESECTION WITH HIATAL HERNIA REPAIR N/A 03/22/2016   Procedure: LAPAROSCOPIC GASTRIC SLEEVE RESECTION WITH HIATAL HERNIA REPAIR WITHUPPER ENDOSCOPY;  Surgeon: Alm Angle, MD;  Location: [facility];  Service: General;  Laterality: N/A;   REDUCTION MAMMAPLASTY Bilateral    SEPTOPLASTY  1988   tonsillectomy  1991    Family History  Problem Relation Age of Onset   COPD Mother    Hypertension Mother    GER disease Mother    Heart attack Father 1 - 23   Prostate cancer Father    Hyperlipidemia Brother    Hypertension Brother    Diabetes Brother    Neuropathy Brother    Heart attack Brother 62 - 93   Hyperlipidemia Brother    Hypertension Brother    Heart attack Paternal Grandmother    Heart attack Paternal Grandfather    Stroke Paternal Grandfather    Cancer Paternal Uncle        prostate and bone   Cancer Paternal Uncle        prostate and bone   Colon cancer Neg Hx    Stomach cancer  Neg Hx    Breast cancer Neg Hx    Social History:  reports that she has never smoked. She has never used smokeless tobacco. She reports that she does not drink alcohol  and does not use drugs.  Allergies: Allergies[1]  Medications Prior to Admission  Medication Sig Dispense Refill   acetaminophen  (TYLENOL ) 500 MG tablet Take 1,000 mg by mouth every 6 (six) hours as needed for moderate pain (pain score 4-6).     aspirin  EC 81 MG tablet Take 1 tablet (81 mg total) by mouth daily. Swallow whole.     atorvastatin  (LIPITOR) 40 MG tablet Take 1 tablet (40 mg total) by mouth daily. 90 tablet 3   citalopram  (CELEXA ) 40 MG tablet Take 40 mg by mouth daily.     ibuprofen  (ADVIL ) 800 MG tablet Take 1 tablet (800 mg total) by mouth  every 8 (eight) hours as needed. (Patient not taking: Reported on 04/23/2024) 30 tablet 0   lamoTRIgine  (LAMICTAL ) 25 MG tablet Take 25 mg by mouth daily.     nitroGLYCERIN  (NITROSTAT ) 0.4 MG SL tablet Place 1 tablet (0.4 mg total) under the tongue every 5 (five) minutes as needed for chest pain. 25 tablet 1   Olopatadine  HCl 0.2 % SOLN Place 1 drop into both eyes daily as needed (allergies).     pantoprazole  (PROTONIX ) 40 MG tablet Take 1 tablet (40 mg total) by mouth daily. For 4-6 weeks, then take as needed. 90 tablet 0   promethazine  (PHENERGAN ) 25 MG tablet Take 1 tablet (25 mg total) by mouth every 8 (eight) hours as needed for nausea or vomiting. 20 tablet 0   Propylene Glycol (SYSTANE BALANCE) 0.6 % SOLN Apply 1 drop to eye daily as needed (dry eyes).     HYDROmorphone  (DILAUDID ) 2 MG tablet Take 1 tablet (2 mg total) by mouth every 4 (four) hours as needed for severe pain (pain score 7-10). (Patient not taking: Reported on 04/22/2024) 20 tablet 0   metoprolol  tartrate (LOPRESSOR ) 100 MG tablet TAKE 1 TABLET BY MOUTH 2 HOURS PRIOR TO THE CARDIAC CT (Patient not taking: No sig reported) 1 tablet 0    No results found for this or any previous visit (from the past 48 hours). No results found.  Review of Systems  Musculoskeletal:  Positive for neck pain.  Neurological:  Positive for numbness.    Blood pressure 130/66, pulse 61, temperature 97.6 F (36.4 C), temperature source Oral, resp. rate 17, height 5' 4 (1.626 m), weight 82.1 kg, last menstrual period 02/10/2015, SpO2 100%. Physical Exam HENT:     Head: Normocephalic.     Right Ear: Tympanic membrane normal.     Nose: Nose normal.     Mouth/Throat:     Mouth: Mucous membranes are moist.  Cardiovascular:     Rate and Rhythm: Normal rate.     Pulses: Normal pulses.  Pulmonary:     Effort: Pulmonary effort is normal.  Musculoskeletal:        General: Normal range of motion.     Cervical back: Normal range of motion.   Skin:    General: Skin is warm.  Neurological:     General: No focal deficit present.     Mental Status: She is alert.     Comments: Strength is 5-5 deltoid, bicep, tricep, wrist flexion, wrist extension, hand intrinsics.      Assessment/Plan 59 year old presents for posterior cervical decompression fusion with foraminotomies C8 nerve root.  Arley SHAUNNA Helling, MD 05/01/2024,  8:10 AM       [1]  Allergies Allergen Reactions   Vistaril [Hydroxyzine] Shortness Of Breath    Dizzy, light headed    Zofran  [Ondansetron  Hcl] Shortness Of Breath and Rash    Numbness - rash looks like a burn   Azithromycin  Swelling   Conjugated Estrogens Other (See Comments)    emotional   Cyclobenzaprine  Hcl     REACTION: disorientation   Hydrocodone      Other reaction(s): Unknown   Misc. Sulfonamide Containing Compounds Other (See Comments)   Norco [Hydrocodone -Acetaminophen ] Other (See Comments)    Felt like she was going through withdrawal   Nsaids Other (See Comments)    Cannot have because of bariatric surgery   Oxycontin  [Oxycodone ]     Withdrawal feeling    Percocet [Oxycodone -Acetaminophen ]     Hypotension    Robaxin [Methocarbamol]     Pt does not remember reaction    Rosuvastatin Dermatitis   Sulfa Antibiotics Hives and Itching   Wellbutrin [Bupropion Hcl] Other (See Comments)    Emotional, makes depression worse   Ciprofloxacin  Itching and Rash   Dicyclomine Hcl Itching and Rash

## 2024-05-01 NOTE — Anesthesia Procedure Notes (Signed)
 Procedure Name: Intubation Date/Time: 05/01/2024 8:42 AM  Performed by: Lockie Flesher, CRNAPre-anesthesia Checklist: Patient identified, Emergency Drugs available, Suction available and Patient being monitored Patient Re-evaluated:Patient Re-evaluated prior to induction Oxygen Delivery Method: Circle System Utilized Preoxygenation: Pre-oxygenation with 100% oxygen Induction Type: IV induction Ventilation: Mask ventilation without difficulty Laryngoscope Size: Mac and 3 Grade View: Grade I Tube type: Oral Tube size: 7.0 mm Number of attempts: 1 Airway Equipment and Method: Stylet and Oral airway Placement Confirmation: ETT inserted through vocal cords under direct vision, positive ETCO2 and breath sounds checked- equal and bilateral Secured at: 22 cm Tube secured with: Tape Dental Injury: Teeth and Oropharynx as per pre-operative assessment

## 2024-05-01 NOTE — Op Note (Signed)
 Operative diagnosis: Cervical spondylosis with left greater than right C8 radiculopathies.  Postoperative diagnosis: Same.  Procedure: #1 bilateral decompressive laminotomies partial facetectomies and foraminotomies of the C8 nerve root utilizing microscopic dissection and microscopic foraminotomies.  2.  Pedicle screw fixation C7-T1 utilizing the NuVasive reline posterior cervical pedicle screw set.  3.  Posterolateral arthrodesis using locally harvested autograft mixed with Vivigen  Surgeon: Arley helling.  Assistant: Suzen Click.  Anesthesia: General.  EBL: Minimal.  HPI: 59 year old female with neck pain left greater than right arm pain rating down C8 distribution.  EMG showed profound C8 radiculopathy on the left imaging showed severe foraminal stenosis bilaterally slightly worse on the right but due to her progression of clinical syndrome similar bilateral symptoms I recommended decompressive laminotomies and foraminotomies at C7-T1 with decompressing the C8 nerve root as well as pedicle screw fixation secondary to evidence of a slight listhesis on imaging and previous cervical fusion from C4-C7 anteriorly.  We extensively went over the risks and benefits of the operation with her as well as perioperative course expectations of outcome and alternatives to surgery and she understood and agreed to proceed forward.  Operative procedure: Patient was brought into the OR was [facility] general anesthesia positioned prone in pins with the backside of her head neck shaved prepped and draped in routine sterile fashion.  Utilizing anatomical landmarks and incision was drawn out and infiltrated with 10 cc lidocaine  with epi made with a blade scalpel subperiosteal dissection was carried lamina of C6 C7-T1 bilaterally interoperative x-ray confirmed application appropriate level so laminotomies were begun initially on the right and then transition to the left and then the operative microscope was draped and  brought into the field under microscopic lamination further partial facetectomies and foraminotomies of the C8 nerve root were performed bilaterally being more aggressive on the left which was predominant symptomatic side.  Attracted to decompress the nerve root to where I can easily pass a black nerve hook out the distal tract of the nerve root explored underneath to confirm no disc herniation.  During the decompression a pinhole dural tear was created on the dorsal aspect the dura on the left so this was packed away during screw placement.  After adequate decompression the C8 nerve root I could easily palpate both the C7 and T1 pedicles and then utilizing [facility] fluoroscopy placed for pedicle screws at C7-T1 bilaterally.  All screws were placed inspected all the foramina to confirm patency no migration of screws and the foraminotomies were still widely patent and nerve roots decompressed.  I then explored the dural defect and it was no longer leaking CSF, walled itself off so placed a piece of DuraGen and the green glue overlying it.  Then some of the rods tightened all the screws down the right C7 not advanced well into the tulip and would not torque but was very tight I did replace it tried a different not same result so I think somehow that head that tulip was not allowing torquing and tightening properly however it was very tight so I left alongside and that one half to replace the screw.  Everything else torqued down properly and every the call construct seem to be tight.  I then decorticated the facet joint at C7-T1 and the lamina at C7 and T1 and packed extensive mount of autograft mixed with Vivigen posterolaterally along the facet joint and lamina at C7-T1.  Placed Gelfoam overlying the dura closed the wound in layers after meticulous hemostasis and copious irrigation  were performed.  Injected Marcaine  in the fascia and closed subcutaneous tissue and skin with Vicryl.  At the end of case all needle count  sponge counts were correct.  Patient went recovery in stable condition

## 2024-05-02 ENCOUNTER — Encounter (HOSPITAL_COMMUNITY): Payer: Self-pay | Admitting: Neurosurgery

## 2024-05-02 NOTE — Evaluation (Signed)
 Physical Therapy Evaluation Patient Details Name: Cassandra Mcconnell MRN: [id number] DOB: [birth year] Today's Date: 05/02/2024  History of Present Illness  Pt is a 59 y/o female presenting on 05/01/24 for same day PCDF C7- T1 with foraminotomies C8 nerve root. PMH includes: anxiety, bell's palsy, HTN, L ankle fx, prior back surgery.  Clinical Impression  PTA pt would ambulate with either SP cane or RW. Unclear if pt is a reliable hx as the RW is currently in the shed. Pt was unable to recall cervical precautions with education given and handout provided. Pt was CGA for all mobility with ability to ambulate 49ft with RW. Two standing rest breaks due to fatigue and cervical incision pain. Pt declined stair negotiation with agreement to practice tomorrow before d/c. Discussed walking program, car transfers, and progressive mobility. Minimal cueing needed throughout mobility to avoid cervical rotation. Pt will have 24/7 assist over the next few days from spouse. Anticipate no post-acute PT needs with acute PT to follow.         If plan is discharge home, recommend the following: A little help with walking and/or transfers;A little help with bathing/dressing/bathroom;Assist for transportation;Help with stairs or ramp for entrance   Can travel by private vehicle    Yes    Equipment Recommendations None recommended by PT     Functional Status Assessment Patient has had a recent decline in their functional status and demonstrates the ability to make significant improvements in function in a reasonable and predictable amount of time.     Precautions / Restrictions Precautions Precautions: Fall;Cervical Precaution Booklet Issued: Yes (comment) Recall of Precautions/Restrictions: Impaired Precaution/Restrictions Comments: pt unable to recall precautions Required Braces or Orthoses:  (no brace needed per order) Restrictions Weight Bearing Restrictions Per Provider Order: No      Mobility  Bed  Mobility Overal bed mobility: Needs Assistance Bed Mobility: Sidelying to Sit, Rolling, Sit to Sidelying Rolling: Contact guard assist Sidelying to sit: Contact guard assist    Sit to sidelying: Contact guard assist General bed mobility comments: CGA for safety with increased time and effort    Transfers Overall transfer level: Needs assistance Equipment used: Rolling walker (2 wheels) Transfers: Sit to/from Stand Sit to Stand: Contact guard assist    General transfer comment: CGA for safety    Ambulation/Gait Ambulation/Gait assistance: Contact guard assist Gait Distance (Feet): 60 Feet Assistive device: Rolling walker (2 wheels) Gait Pattern/deviations: Step-through pattern, Decreased stride length Gait velocity: decr    General Gait Details: reliance on UE support with guarded gait pattern. Two short standing rest breaks 2/2 pain and fatigue    Balance Overall balance assessment: Needs assistance Sitting-balance support: No upper extremity supported, Feet supported Sitting balance-Leahy Scale: Fair     Standing balance support: Bilateral upper extremity supported, During functional activity, Single extremity supported Standing balance-Leahy Scale: Poor Standing balance comment: reliant on BUE support        Pertinent Vitals/Pain Pain Assessment Pain Assessment: Faces Faces Pain Scale: Hurts even more Pain Location: neck- surgical site Pain Descriptors / Indicators: Discomfort, Operative site guarding Pain Intervention(s): Limited activity within patient's tolerance, Monitored during session, Repositioned    Home Living Family/patient expects to be discharged to:: Private residence Living Arrangements: Spouse/significant other Available Help at Discharge: Family;Available 24 hours/day Type of Home: Mobile home Home Access: Stairs to enter Entrance Stairs-Rails:  (one handrail, unclear which side) Entrance Stairs-Number of Steps: 5   Home Layout: One  level Home Equipment: [employer] (2 wheels);BSC/3in1;Shower seat;Cane -  single point      Prior Function Prior Level of Function : Independent/Modified Independent    Mobility Comments: pt reports using cane vs RW as needed, but then reports RW is in the shed ADLs Comments: pt reports managing ADLs, light IADLs.  needing supervision in shower at times when she is dizzy.  Driving and managing meds     Extremity/Trunk Assessment   Upper Extremity Assessment Upper Extremity Assessment: Defer to OT evaluation    Lower Extremity Assessment Lower Extremity Assessment: Overall WFL for tasks assessed    Cervical / Trunk Assessment Cervical / Trunk Assessment: Neck Surgery  Communication   Communication Communication: No apparent difficulties    Cognition Arousal: Lethargic Behavior During Therapy: Flat affect   PT - Cognitive impairments: Memory    PT - Cognition Comments: unable to recall precautions with cueing needed during session Following commands: Impaired Following commands impaired: Follows one step commands with increased time, Follows multi-step commands inconsistently     Cueing Cueing Techniques: Verbal cues     General Comments General comments (skin integrity, edema, etc.): Spouse present and supportive during eval     PT Assessment Patient needs continued PT services  PT Problem List Decreased activity tolerance;Decreased balance;Decreased mobility;Decreased knowledge of precautions       PT Treatment Interventions DME instruction;Stair training;Gait training;Functional mobility training;Therapeutic activities;Therapeutic exercise;Balance training;Neuromuscular re-education;Patient/family education    PT Goals (Current goals can be found in the Care Plan section)  Acute Rehab PT Goals Patient Stated Goal: to feel better PT Goal Formulation: With patient/family Time For Goal Achievement: 05/16/24 Potential to Achieve Goals: Good    Frequency Min  5X/week        AM-PAC PT 6 Clicks Mobility  Outcome Measure Help needed turning from your back to your side while in a flat bed without using bedrails?: A Little Help needed moving from lying on your back to sitting on the side of a flat bed without using bedrails?: A Little Help needed moving to and from a bed to a chair (including a wheelchair)?: A Little Help needed standing up from a chair using your arms (e.g., wheelchair or bedside chair)?: A Little Help needed to walk in hospital room?: A Little Help needed climbing 3-5 steps with a railing? : A Lot 6 Click Score: 17    End of Session   Activity Tolerance: Patient tolerated treatment well Patient left: in bed;with call bell/phone within reach;with family/visitor present Nurse Communication: Mobility status PT Visit Diagnosis: Unsteadiness on feet (R26.81);Other abnormalities of gait and mobility (R26.89)    Time: 8675-8657 PT Time Calculation (min) (ACUTE ONLY): 18 min   Charges:   PT Evaluation $PT Eval Low Complexity: 1 Low   PT General Charges $$ ACUTE PT VISIT: 1 Visit       Kate ORN, PT, DPT Secure Chat Preferred  Rehab Office [phone]   Kate BRAVO Wendolyn 05/02/2024, 1:51 PM

## 2024-05-02 NOTE — Evaluation (Signed)
 Occupational Therapy Evaluation Patient Details Name: Cassandra Mcconnell MRN: [id number] DOB: 01-08-1966 Today's Date: 05/02/2024   History of Present Illness   Pt is a 59 y/o female presenting on 05/01/24 for same day PCDF C7- T1 with foraminotomies C8 nerve root. PMH includes: anxiety, bell's palsy, HTN, L ankle fx, prior back surgery.     Clinical Impressions Patient admitted for above and presents with problem list below.  PTA pt was independent with ADLs, mobility using cane vs RW, and managing IADLs.  Pt with variable hx, reporting using cane vs RW as needed but then later reports her RW is out in the shed.  Patient was educated on cervical precautions, ADL compensatory techniques, AE/DME, mobility progression, safety and recommendations.  Today, pt demonstrated ability to complete bed mobility with contact guard assist, transfers with min assist, functional mobility with min assist using hand held assist, and ADLs with up to min assist.  Pt unable to recall cervical precautions (only 1/3), and requires cueing to maintain eyes open during ADLs and mobility.  Pt reports increased lethargy due to pain medications.  No headache throughout session, woozy upon sitting EOB, but VSS. At discharge, pt will have support from spouse.  Based on performance today, will follow acutely to optimize independence and safety but anticipate no further needs after dc home.  OT will follow, recommend PT evaluation as well.       If plan is discharge home, recommend the following:   A little help with walking and/or transfers;A little help with bathing/dressing/bathroom;Assistance with cooking/housework;Direct supervision/assist for medications management;Direct supervision/assist for financial management;Help with stairs or ramp for entrance;Assist for transportation     Functional Status Assessment   Patient has had a recent decline in their functional status and demonstrates the ability to make significant  improvements in function in a reasonable and predictable amount of time.     Equipment Recommendations   None recommended by OT     Recommendations for Other Services   PT consult     Precautions/Restrictions   Precautions Precautions: Fall;Cervical Precaution Booklet Issued: No Recall of Precautions/Restrictions: Impaired Precaution/Restrictions Comments: pt unable to recall precautions, only recalling 1/3 Required Braces or Orthoses:  (no brace needed per order) Restrictions Weight Bearing Restrictions Per Provider Order: No     Mobility Bed Mobility Overal bed mobility: Needs Assistance Bed Mobility: Sidelying to Sit, Rolling, Sit to Sidelying Rolling: Contact guard assist Sidelying to sit: Contact guard assist     Sit to sidelying: Contact guard assist      Transfers Overall transfer level: Needs assistance Equipment used: None Transfers: Sit to/from Stand Sit to Stand: Min assist           General transfer comment: pt needing min assist to steady, mild sway during transfers.      Balance Overall balance assessment: Needs assistance Sitting-balance support: No upper extremity supported, Feet supported Sitting balance-Leahy Scale: Fair     Standing balance support: Bilateral upper extremity supported, During functional activity, Single extremity supported Standing balance-Leahy Scale: Poor Standing balance comment: pt with 1 UE support, simulating cane, but reaching out for support with L UE.                           ADL either performed or assessed with clinical judgement   ADL Overall ADL's : Needs assistance/impaired     Grooming: Set up;Sitting           Upper Body  Dressing : Minimal assistance;Sitting   Lower Body Dressing: Minimal assistance;Sit to/from stand;Sitting/lateral leans   Toilet Transfer: Minimal assistance;Ambulation           Functional mobility during ADLs: Minimal assistance;Cueing for safety        Vision   Vision Assessment?: No apparent visual deficits Additional Comments: cueing to maintain eyes open during session     Perception         Praxis         Pertinent Vitals/Pain Pain Assessment Pain Assessment: Faces Faces Pain Scale: Hurts even more Pain Location: neck- surgical site Pain Descriptors / Indicators: Discomfort, Operative site guarding Pain Intervention(s): Limited activity within patient's tolerance, Monitored during session, Repositioned     Extremity/Trunk Assessment Upper Extremity Assessment Upper Extremity Assessment: Overall WFL for tasks assessed (within cervical precautions)   Lower Extremity Assessment Lower Extremity Assessment: Defer to PT evaluation   Cervical / Trunk Assessment Cervical / Trunk Assessment: Neck Surgery   Communication Communication Communication: No apparent difficulties   Cognition Arousal: Lethargic Behavior During Therapy: Flat affect               OT - Cognition Comments: pt cognition limited by lethargy, pt reports due to pain medications.  She follows simple commands with increased time but requires cueing to maintain eyes open during session.  Poor awareness to safety (trying to walk with eyes closed).                 Following commands: Impaired Following commands impaired: Follows one step commands with increased time, Follows multi-step commands inconsistently     Cueing  General Comments   Cueing Techniques: Verbal cues  pt denies headaches during session, left with HOB at 40*.  INitally reports woozy but BP stable.   Exercises     Shoulder Instructions      Home Living Family/patient expects to be discharged to:: Private residence Living Arrangements: Spouse/significant other Available Help at Discharge: Family;Available 24 hours/day Type of Home: Mobile home Home Access: Stairs to enter Entrance Stairs-Number of Steps: 5 Entrance Stairs-Rails: None Home Layout: One  level     Bathroom Shower/Tub: Producer, Television/film/video: Standard     Home Equipment: Agricultural Consultant (2 wheels);BSC/3in1;Shower seat;Cane - single point          Prior Functioning/Environment Prior Level of Function : Independent/Modified Independent             Mobility Comments: pt reports using cane vs RW as needed, but then reports RW is in the shed ADLs Comments: pt reports managing ADLs, light IADLs.  needing supervision in shower at times when she is dizzy.  Driving and managing meds    OT Problem List: Decreased strength;Decreased activity tolerance;Impaired balance (sitting and/or standing);Pain;Decreased knowledge of precautions;Decreased knowledge of use of DME or AE   OT Treatment/Interventions: Self-care/ADL training;Therapeutic exercise;DME and/or AE instruction;Therapeutic activities;Cognitive remediation/compensation;Patient/family education;Balance training      OT Goals(Current goals can be found in the care plan section)   Acute Rehab OT Goals Patient Stated Goal: home when ready OT Goal Formulation: With patient Time For Goal Achievement: 05/16/24 Potential to Achieve Goals: Good   OT Frequency:  Min 2X/week    Co-evaluation              AM-PAC OT 6 Clicks Daily Activity     Outcome Measure Help from another person eating meals?: A Little Help from another person taking care of personal grooming?: A Little Help  from another person toileting, which includes using toliet, bedpan, or urinal?: A Little Help from another person bathing (including washing, rinsing, drying)?: A Little Help from another person to put on and taking off regular upper body clothing?: A Little Help from another person to put on and taking off regular lower body clothing?: A Little 6 Click Score: 18   End of Session Equipment Utilized During Treatment: Gait belt Nurse Communication: Mobility status;Precautions  Activity Tolerance: Patient tolerated  treatment well Patient left: in bed;with call bell/phone within reach;with bed alarm set  OT Visit Diagnosis: Other abnormalities of gait and mobility (R26.89);Pain;Other symptoms and signs involving cognitive function Pain - part of body:  (neck)                Time: 9163-9140 OT Time Calculation (min): 23 min Charges:  OT General Charges $OT Visit: 1 Visit OT Evaluation $OT Eval Moderate Complexity: 1 Mod OT Treatments $Self Care/Home Management : 8-22 mins  Etta NOVAK, OT Acute Rehabilitation Services Office [phone] Secure Chat Preferred    Etta GORMAN Hope 05/02/2024, 9:15 AM

## 2024-05-02 NOTE — Progress Notes (Signed)
 Subjective: Patient reports doing well condition neck pain with improved upper extremity radicular   Objective: Vital signs in last 24 hours: Temp:  [97.9 F (36.6 C)-99.6 F (37.6 C)] 99.6 F (37.6 C) (01/08 0302) Pulse Rate:  [66-105] 84 (01/08 0302) Resp:  [14-20] 18 (01/08 0302) BP: (114-162)/(60-114) 149/67 (01/08 0302) SpO2:  [92 %-100 %] 100 % (01/08 0302)  Intake/Output from previous day: 01/07 0701 - 01/08 0700 In: 1600 [I.V.:900; IV Piggyback:700] Out: 1670 [Urine:1370; Blood:300] Intake/Output this shift: No intake/output data recorded.  Incision clean dry and intact strength 5 out of 5  Lab Results: No results for input(s): WBC, HGB, HCT, PLT in the last 72 hours. BMET No results for input(s): NA, K, CL, CO2, GLUCOSE, BUN, CREATININE, CALCIUM  in the last 72 hours.  Studies/Results: DG Cervical Spine 2 or 3 views Result Date: 05/01/2024 EXAM: FLUOROSCOPIC IMAGING TECHNIQUE: Fluoroscopy was provided by the radiology department for procedure. Radiologist was not present during examination. RADIATION DOSE INDEX: Reference Air Kerma: 17.22 mGy COMPARISON: None available. CLINICAL HISTORY: Intraprocedural imaging. FINDINGS: Intraoperative fluoroscopic imaging was performed. IMPRESSION: 1. Intraoperative fluoroscopic imaging as above. Please refer to the operative report for full details. Electronically signed by: Dayne Hassell MD 05/01/2024 03:09 PM EST RP Workstation: [id number]   DG C-Arm 1-60 Min-No Report Result Date: 05/01/2024 Fluoroscopy was utilized by the requesting physician.  No radiographic interpretation.   DG C-Arm 1-60 Min-No Report Result Date: 05/01/2024 Fluoroscopy was utilized by the requesting physician.  No radiographic interpretation.    Assessment/Plan: Postop day 1 posterior cervical decompression fusion doing well still with a lot of neck pain will mobilize with occupational therapy this morning probably observe another day  no headache or sign of CSF leak.  LOS: 1 day     Cassandra Mcconnell 05/02/2024, 7:42 AM

## 2024-05-03 MED ORDER — TIZANIDINE HCL 4 MG PO TABS: 4.0000 mg | ORAL_TABLET | Freq: Three times a day (TID) | ORAL | 1 refills | Status: AC | PRN

## 2024-05-03 MED ORDER — HYDROMORPHONE HCL 2 MG PO TABS: 2.0000 mg | ORAL_TABLET | ORAL | 0 refills | Status: AC | PRN

## 2024-05-03 NOTE — Discharge Instructions (Addendum)
 Wound Care Keep incision covered and dry until post op day 3. You may remove the Honeycomb dressing on post op day 3. Leave steri-strips on neck.  They will fall off by themselves. Do not put any creams, lotions, or ointments on incision. You are fine to shower. Let water  run over incision and pat dry.   Activity Walk each and every day, increasing distance each day. No lifting greater than 8 lbs.  No lifting no bending no twisting no driving or riding a car unless you are coming to see the doctor.   Diet Resume your normal diet.   Return to Work Will be discussed at your follow up appointment.  Call Your Doctor If Any of These Occur Redness, drainage, or swelling at the wound.  Temperature greater than 101 degrees. Severe pain not relieved by pain medication. Incision starts to come apart.  Follow Up Appt Call [phone] if you have one or any problem.

## 2024-05-03 NOTE — Progress Notes (Signed)
 Orthopedic Tech Progress Note Patient Details:  Cassandra Mcconnell 07-Apr-1966 [id number]  Ortho Devices Type of Ortho Device: Soft collar Ortho Device/Splint Location: Neck Ortho Device/Splint Interventions: Ordered, Application   Post Interventions Patient Tolerated: Well  Shermika Balthaser A Kasidi Shanker 05/03/2024, 8:44 AM

## 2024-05-03 NOTE — Discharge Summary (Signed)
 " Physician Discharge Summary  Patient ID: Cassandra Mcconnell MRN: [id number] DOB/AGE: June 10, 1965 59 y.o. Estimated body mass index is 31.07 kg/m as calculated from the following:   Height as of this encounter: 5' 4 (1.626 m).   Weight as of this encounter: 82.1 kg.   Admit date: 05/01/2024 Discharge date: 05/03/2024  Admission Diagnoses: C8 radiculopathy spondylosis C7-T1  Discharge Diagnoses: Same Principal Problem:   Spinal stenosis in cervical region   Discharged Condition: good  Hospital Course: Patient was admitted to hospital underwent decompressive cervical laminectomy at C7-T1 intraoperatively had a spinal fluid leak patient was kept flat on the floor for first postoperative day and mobilized the second day and mobilize with therapy and pain became under better control was stable for discharge home on postop day 2.  Patient will be discharged to schedule follow-up in 1 to 2 weeks.  Consults: Significant Diagnostic Studies: Treatments: Posterior cervical decompressive laminotomies and fusion C7-T1 Discharge Exam: Blood pressure (!) 140/72, pulse 93, temperature 98.6 F (37 C), temperature source Oral, resp. rate 19, height 5' 4 (1.626 m), weight 82.1 kg, last menstrual period 02/10/2015, SpO2 99%. Strength 5 out of 5 incision clean dry and intact  Disposition: Home   Allergies as of 05/03/2024       Reactions   Vistaril [hydroxyzine] Shortness Of Breath   Dizzy, light headed    Zofran  [ondansetron  Hcl] Shortness Of Breath, Rash   Numbness - rash looks like a burn   Azithromycin  Swelling   Conjugated Estrogens Other (See Comments)   emotional   Cyclobenzaprine  Hcl    REACTION: disorientation   Hydrocodone     Other reaction(s): Unknown   Misc. Sulfonamide Containing Compounds Other (See Comments)   Norco [hydrocodone -acetaminophen ] Other (See Comments)   Felt like she was going through withdrawal   Nsaids Other (See Comments)   Cannot have because of bariatric  surgery   Oxycontin  [oxycodone ]    Withdrawal feeling    Percocet [oxycodone -acetaminophen ]    Hypotension   Robaxin [methocarbamol]    Pt does not remember reaction    Rosuvastatin Dermatitis   Sulfa Antibiotics Hives, Itching   Wellbutrin [bupropion Hcl] Other (See Comments)   Emotional, makes depression worse   Ciprofloxacin  Itching, Rash   Dicyclomine Hcl Itching, Rash        Medication List     STOP taking these medications    ibuprofen  800 MG tablet Commonly known as: ADVIL        TAKE these medications    acetaminophen  500 MG tablet Commonly known as: TYLENOL  Take 1,000 mg by mouth every 6 (six) hours as needed for moderate pain (pain score 4-6).   aspirin  EC 81 MG tablet Take 1 tablet (81 mg total) by mouth daily. Swallow whole.   atorvastatin  40 MG tablet Commonly known as: Lipitor Take 1 tablet (40 mg total) by mouth daily.   citalopram  40 MG tablet Commonly known as: CELEXA  Take 40 mg by mouth daily.   HYDROmorphone  2 MG tablet Commonly known as: Dilaudid  Take 1 tablet (2 mg total) by mouth every 4 (four) hours as needed for severe pain (pain score 7-10). What changed: Another medication with the same name was added. Make sure you understand how and when to take each.   HYDROmorphone  2 MG tablet Commonly known as: Dilaudid  Take 1 tablet (2 mg total) by mouth every 4 (four) hours as needed for severe pain (pain score 7-10). What changed: You were already taking a medication with the same name,  and this prescription was added. Make sure you understand how and when to take each.   lamoTRIgine  25 MG tablet Commonly known as: LAMICTAL  Take 25 mg by mouth daily.   metoprolol  tartrate 100 MG tablet Commonly known as: LOPRESSOR  TAKE 1 TABLET BY MOUTH 2 HOURS PRIOR TO THE CARDIAC CT   nitroGLYCERIN  0.4 MG SL tablet Commonly known as: NITROSTAT  Place 1 tablet (0.4 mg total) under the tongue every 5 (five) minutes as needed for chest pain.    Olopatadine  HCl 0.2 % Soln Place 1 drop into both eyes daily as needed (allergies).   pantoprazole  40 MG tablet Commonly known as: Protonix  Take 1 tablet (40 mg total) by mouth daily. For 4-6 weeks, then take as needed.   promethazine  25 MG tablet Commonly known as: PHENERGAN  Take 1 tablet (25 mg total) by mouth every 8 (eight) hours as needed for nausea or vomiting.   Systane Balance 0.6 % Soln Generic drug: Propylene Glycol Apply 1 drop to eye daily as needed (dry eyes).   tiZANidine  4 MG tablet Commonly known as: Zanaflex  Take 1 tablet (4 mg total) by mouth every 8 (eight) hours as needed for muscle spasms.         Signed: Arley SHAUNNA Helling 05/03/2024, 10:49 AM   "

## 2024-05-03 NOTE — Progress Notes (Signed)
 Physical Therapy Treatment  Patient Details Name: Cassandra Mcconnell MRN: [id number] DOB: [birth year] Today's Date: 05/03/2024   History of Present Illness Pt is a 59 y/o female presenting on 05/01/24 for same day PCDF C7- T1 with foraminotomies C8 nerve root. PMH includes: anxiety, bell's palsy, HTN, L ankle fx, prior back surgery.    PT Comments  Pt progressing well with post-op mobility. She was able to demonstrate transfers and ambulation with gross CGA to supervision for safety and RW for support. Reinforced education on precautions, brace application/wearing schedule, appropriate activity progression, and car transfer. Will continue to follow.      If plan is discharge home, recommend the following: A little help with walking and/or transfers;A little help with bathing/dressing/bathroom;Assist for transportation;Help with stairs or ramp for entrance   Can travel by private vehicle        Equipment Recommendations  None recommended by PT    Recommendations for Other Services       Precautions / Restrictions Precautions Precautions: Fall;Cervical Precaution Booklet Issued: Yes (comment) Recall of Precautions/Restrictions: Intact Precaution/Restrictions Comments: pt able to recall precautions but needs cueing to adhere to functionally Required Braces or Orthoses: Cervical Brace Cervical Brace: Soft collar Restrictions Weight Bearing Restrictions Per Provider Order: No     Mobility  Bed Mobility Overal bed mobility: Needs Assistance Bed Mobility: Sidelying to Sit, Rolling Rolling: Supervision Sidelying to sit: Contact guard assist       General bed mobility comments: Pt demonstrated log roll with HOB elevated. Slow and effortful but overall pt able to complete without assistance.    Transfers Overall transfer level: Needs assistance Equipment used: Rolling walker (2 wheels) Transfers: Sit to/from Stand Sit to Stand: Supervision           General transfer comment: Pt  able to power up to full stand without assistance. No unsteadiness or LOB noted.    Ambulation/Gait Ambulation/Gait assistance: Contact guard assist, Supervision Gait Distance (Feet): 200 Feet Assistive device: Rolling walker (2 wheels) Gait Pattern/deviations: Step-through pattern, Decreased stride length, Trunk flexed Gait velocity: Decreased Gait velocity interpretation: 1.31 - 2.62 ft/sec, indicative of limited community ambulator   General Gait Details: Slow and guarded due to pain but pt able to ambulate well without overt LOB.   Stairs Stairs: Yes Stairs assistance: Contact guard assist Stair Management: One rail Left, Alternating pattern, Forwards Number of Stairs: 5 General stair comments: VC's for sequencing and general safety.   Wheelchair Mobility     Tilt Bed    Modified Rankin (Stroke Patients Only)       Balance Overall balance assessment: Needs assistance Sitting-balance support: No upper extremity supported, Feet supported Sitting balance-Leahy Scale: Fair     Standing balance support: Bilateral upper extremity supported, During functional activity, No upper extremity supported Standing balance-Leahy Scale: Fair Standing balance comment: reliant on BUE support                            Communication Communication Communication: No apparent difficulties  Cognition Arousal: Alert Behavior During Therapy: Flat affect   PT - Cognitive impairments: Memory                         Following commands: Intact      Cueing Cueing Techniques: Verbal cues, Visual cues  Exercises      General Comments        Pertinent Vitals/Pain Pain Assessment Pain Assessment:  Faces Faces Pain Scale: Hurts little more Pain Location: neck- surgical site Pain Descriptors / Indicators: Discomfort, Operative site guarding Pain Intervention(s): Limited activity within patient's tolerance, Monitored during session, Repositioned    Home  Living Family/patient expects to be discharged to:: Private residence Living Arrangements: Spouse/significant other Available Help at Discharge: Family;Available 24 hours/day Type of Home: Mobile home Home Access: Stairs to enter Entrance Stairs-Rails:  (one handrail, unclear which side) Entrance Stairs-Number of Steps: 5   Home Layout: One level Home Equipment: Agricultural Consultant (2 wheels);BSC/3in1;Shower seat;Cane - single point      Prior Function            PT Goals (current goals can now be found in the care plan section) Acute Rehab PT Goals Patient Stated Goal: Be able to shower PT Goal Formulation: With patient/family Time For Goal Achievement: 05/16/24 Potential to Achieve Goals: Good Progress towards PT goals: Progressing toward goals    Frequency    Min 5X/week      PT Plan      Co-evaluation              AM-PAC PT 6 Clicks Mobility   Outcome Measure  Help needed turning from your back to your side while in a flat bed without using bedrails?: A Little Help needed moving from lying on your back to sitting on the side of a flat bed without using bedrails?: A Little Help needed moving to and from a bed to a chair (including a wheelchair)?: A Little Help needed standing up from a chair using your arms (e.g., wheelchair or bedside chair)?: A Little Help needed to walk in hospital room?: A Little Help needed climbing 3-5 steps with a railing? : A Lot 6 Click Score: 17    End of Session Equipment Utilized During Treatment: Gait belt Activity Tolerance: Patient tolerated treatment well Patient left: in bed;with call bell/phone within reach;with family/visitor present Nurse Communication: Mobility status PT Visit Diagnosis: Unsteadiness on feet (R26.81);Other abnormalities of gait and mobility (R26.89)     Time: 9169-9141 PT Time Calculation (min) (ACUTE ONLY): 28 min  Charges:    $Gait Training: 23-37 mins PT General Charges $$ ACUTE PT VISIT: 1  Visit                     Leita Sable, PT, DPT Acute Rehabilitation Services Secure Chat Preferred Office: [phone]    Leita JONETTA Sable 05/03/2024, 11:27 AM

## 2024-05-03 NOTE — Progress Notes (Signed)
 Patient alert and oriented, mae's well, voiding adequate amount of urine, swallowing without difficulty,  c/o pain at time of discharge, and pain medication given prior to discharge. Patient discharged home with husband. Script and discharged instructions given to patient. Patient and husband stated understanding of instructions given. Patient has an appointment with Dr. Onetha In 2 weeks.

## 2024-05-03 NOTE — Progress Notes (Signed)
 Occupational Therapy Treatment Patient Details Name: LILLYONNA ARMSTEAD MRN: [id number] DOB: [birth year] Today's Date: 05/03/2024   History of present illness Pt is a 59 y/o female presenting on 05/01/24 for same day PCDF C7- T1 with foraminotomies C8 nerve root. PMH includes: anxiety, bell's palsy, HTN, L ankle fx, prior back surgery.   OT comments  Pt in bathroom upon entry, reviewed compensatory techniques for toileting and grooming to adhere to cervical precautions.  Pt able to recall 3/3 precautions with increased time today. Pt demonstrates LB dressing with supervision simulated.  Discussed use of [place] in tub or walk in shower, needing contact guard assist to simulate stepping over tub threshold using wall.  Encouraged use of RW during all mobility due to reports of shakiness in legs.  Pt declines need to practice bed mobility, as spouse will assist her at home.  Will follow acutely, continue to recommend no OT follow up after dc.       If plan is discharge home, recommend the following:  A little help with walking and/or transfers;A little help with bathing/dressing/bathroom;Assistance with cooking/housework;Help with stairs or ramp for entrance;Assist for transportation   Equipment Recommendations  None recommended by OT    Recommendations for Other Services      Precautions / Restrictions Precautions Precautions: Fall;Cervical Precaution Booklet Issued: Yes (comment) Recall of Precautions/Restrictions: Intact Precaution/Restrictions Comments: pt able to recall precautions but needs cueing to adhere to functionally Required Braces or Orthoses:  (no brace needed per order) Restrictions Weight Bearing Restrictions Per Provider Order: No       Mobility Bed Mobility               General bed mobility comments: discussed technique, pt declined practicing as she will have support of spouse at home    Transfers Overall transfer level: Needs assistance Equipment used: Rolling  walker (2 wheels) Transfers: Sit to/from Stand Sit to Stand: Supervision           General transfer comment: for safety     Balance Overall balance assessment: Needs assistance Sitting-balance support: No upper extremity supported, Feet supported Sitting balance-Leahy Scale: Fair     Standing balance support: Bilateral upper extremity supported, During functional activity, No upper extremity supported Standing balance-Leahy Scale: Fair                             ADL either performed or assessed with clinical judgement   ADL Overall ADL's : Needs assistance/impaired     Grooming: Supervision/safety;Standing;Wash/dry hands           Upper Body Dressing : Supervision/safety;Sitting   Lower Body Dressing: Supervision/safety;Sit to/from stand   Toilet Transfer: Supervision/safety;Ambulation;Rolling walker (2 wheels)   Toileting- Clothing Manipulation and Hygiene: Supervision/safety;Sit to/from stand   Tub/ Shower Transfer: Tub transfer;Contact guard assist;Ambulation;Shower seat;Rolling walker (2 wheels) Tub/Shower Transfer Details (indicate cue type and reason): side stepping over threshold simulated holding onto wall Functional mobility during ADLs: Supervision/safety;Rolling walker (2 wheels) General ADL Comments: cueing for safety, using RW throughout ADls/mobility as pt reports her legs feeling shaky.  Pt simulated ADLs with supervision.    Extremity/Trunk Assessment              Vision       Rest[employer], [employer] Communication: No apparent difficulties   Cognition Arousal: Alert Behavior During Therapy: Flat affect Cognition: No apparent impairments  Following commands: Intact        Cueing   Cueing Techniques: Verbal cues, Visual cues  Exercises      Shoulder Instructions       General Comments      Pertinent Vitals/ Pain       Pain  Assessment Pain Assessment: Faces Faces Pain Scale: Hurts little more Pain Location: neck- surgical site Pain Descriptors / Indicators: Discomfort, Operative site guarding Pain Intervention(s): Limited activity within patient's tolerance, Monitored during session, Repositioned  Home Living                                          Prior Functioning/Environment              Frequency  Min 2X/week        Progress Toward Goals  OT Goals(current goals can now be found in the care plan section)  Progress towards OT goals: Progressing toward goals  Acute Rehab OT Goals Patient Stated Goal: home today OT Goal Formulation: With patient Time For Goal Achievement: 05/16/24 Potential to Achieve Goals: Good  Plan      Co-evaluation                 AM-PAC OT 6 Clicks Daily Activity     Outcome Measure   Help from another person eating meals?: None Help from another person taking care of personal grooming?: A Little Help from another person toileting, which includes using toliet, bedpan, or urinal?: A Little Help from another person bathing (including washing, rinsing, drying)?: A Little Help from another person to put on and taking off regular upper body clothing?: A Little Help from another person to put on and taking off regular lower body clothing?: A Little 6 Click Score: 19    End of Session    OT Visit Diagnosis: Other abnormalities of gait and mobility (R26.89);Pain;Other symptoms and signs involving cognitive function Pain - part of body:  (neck)   Activity Tolerance Patient tolerated treatment well   Patient Left with call bell/phone within reach;with nursing/sitter in room;Other (comment) (sitting EOB)   Nurse Communication Mobility status;Precautions        Time: 9175-9166 OT Time Calculation (min): 9 min  Charges: OT General Charges $OT Visit: 1 Visit OT Treatments $Self Care/Home Management : 8-22 mins  Etta NOVAK,  OT Acute Rehabilitation Services Office [phone] Secure Chat Preferred    Etta GORMAN [place] 05/03/2024, 8:46 AM

## 2024-05-15 ENCOUNTER — Ambulatory Visit: Payer: HMO

## 2024-05-27 ENCOUNTER — Encounter: Admitting: Dietician

## 2024-06-03 ENCOUNTER — Encounter: Admitting: Dietician

## 2024-06-10 ENCOUNTER — Ambulatory Visit (HOSPITAL_COMMUNITY): Payer: Self-pay | Admitting: Licensed Clinical Social Worker

## 2024-08-14 ENCOUNTER — Ambulatory Visit (HOSPITAL_COMMUNITY): Admit: 2024-08-14 | Admitting: General Surgery

## 2024-08-14 ENCOUNTER — Encounter (HOSPITAL_COMMUNITY): Payer: Self-pay
